# Patient Record
Sex: Female | Born: 1935 | Race: White | Hispanic: No | Smoking: Never smoker
Health system: Southern US, Community
[De-identification: ages and names within clinical notes are randomized; demographics above are authoritative.]

## PROBLEM LIST (undated history)

## (undated) DIAGNOSIS — N390 Urinary tract infection, site not specified: Secondary | ICD-10-CM

## (undated) DIAGNOSIS — K219 Gastro-esophageal reflux disease without esophagitis: Secondary | ICD-10-CM

## (undated) DIAGNOSIS — D649 Anemia, unspecified: Secondary | ICD-10-CM

## (undated) DIAGNOSIS — I639 Cerebral infarction, unspecified: Secondary | ICD-10-CM

## (undated) DIAGNOSIS — E119 Type 2 diabetes mellitus without complications: Secondary | ICD-10-CM

## (undated) DIAGNOSIS — M810 Age-related osteoporosis without current pathological fracture: Secondary | ICD-10-CM

## (undated) DIAGNOSIS — F03918 Unspecified dementia, unspecified severity, with other behavioral disturbance: Secondary | ICD-10-CM

## (undated) DIAGNOSIS — R42 Dizziness and giddiness: Secondary | ICD-10-CM

## (undated) DIAGNOSIS — F0390 Unspecified dementia without behavioral disturbance: Secondary | ICD-10-CM

## (undated) DIAGNOSIS — K2210 Ulcer of esophagus without bleeding: Secondary | ICD-10-CM

## (undated) DIAGNOSIS — E079 Disorder of thyroid, unspecified: Secondary | ICD-10-CM

## (undated) DIAGNOSIS — F0391 Unspecified dementia with behavioral disturbance: Secondary | ICD-10-CM

## (undated) DIAGNOSIS — E785 Hyperlipidemia, unspecified: Secondary | ICD-10-CM

## (undated) DIAGNOSIS — I10 Essential (primary) hypertension: Secondary | ICD-10-CM

## (undated) DIAGNOSIS — F419 Anxiety disorder, unspecified: Secondary | ICD-10-CM

## (undated) DIAGNOSIS — I480 Paroxysmal atrial fibrillation: Secondary | ICD-10-CM

## (undated) HISTORY — DX: Paroxysmal atrial fibrillation: I48.0

## (undated) HISTORY — PX: APPENDECTOMY: SHX54

## (undated) HISTORY — DX: Ulcer of esophagus without bleeding: K22.10

## (undated) HISTORY — DX: Age-related osteoporosis without current pathological fracture: M81.0

## (undated) HISTORY — DX: Anxiety disorder, unspecified: F41.9

## (undated) HISTORY — DX: Unspecified dementia, unspecified severity, with other behavioral disturbance: F03.918

## (undated) HISTORY — PX: CHOLECYSTECTOMY: SHX55

## (undated) HISTORY — PX: ABDOMINAL HYSTERECTOMY: SHX81

## (undated) HISTORY — DX: Hyperlipidemia, unspecified: E78.5

## (undated) HISTORY — DX: Unspecified dementia with behavioral disturbance: F03.91

## (undated) HISTORY — DX: Dizziness and giddiness: R42

## (undated) HISTORY — DX: Gastro-esophageal reflux disease without esophagitis: K21.9

## (undated) HISTORY — DX: Unspecified dementia, unspecified severity, without behavioral disturbance, psychotic disturbance, mood disturbance, and anxiety: F03.90

## (undated) HISTORY — DX: Disorder of thyroid, unspecified: E07.9

---

## 2002-12-05 ENCOUNTER — Encounter: Payer: Self-pay | Admitting: Gastroenterology

## 2002-12-05 ENCOUNTER — Ambulatory Visit (HOSPITAL_COMMUNITY): Admission: RE | Admit: 2002-12-05 | Discharge: 2002-12-05 | Payer: Self-pay | Admitting: Gastroenterology

## 2003-05-22 ENCOUNTER — Ambulatory Visit (HOSPITAL_COMMUNITY): Admission: RE | Admit: 2003-05-22 | Discharge: 2003-05-22 | Payer: Self-pay | Admitting: Gastroenterology

## 2012-12-21 ENCOUNTER — Emergency Department (HOSPITAL_COMMUNITY)
Admission: EM | Admit: 2012-12-21 | Discharge: 2012-12-21 | Disposition: A | Discharge status: Home / Self Care | Payer: Medicare Other

## 2013-02-01 ENCOUNTER — Ambulatory Visit (HOSPITAL_COMMUNITY): Payer: Self-pay | Admitting: Psychiatry

## 2013-03-27 ENCOUNTER — Ambulatory Visit (HOSPITAL_COMMUNITY): Payer: Self-pay | Admitting: Psychiatry

## 2013-04-02 ENCOUNTER — Ambulatory Visit (INDEPENDENT_AMBULATORY_CARE_PROVIDER_SITE_OTHER): Payer: No Typology Code available for payment source | Admitting: Psychiatry

## 2013-04-02 ENCOUNTER — Encounter (HOSPITAL_COMMUNITY): Payer: Self-pay | Admitting: Psychiatry

## 2013-04-02 VITALS — BP 121/64 | HR 70 | Ht 59.0 in | Wt 162.0 lb

## 2013-04-02 DIAGNOSIS — F411 Generalized anxiety disorder: Secondary | ICD-10-CM
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
## 2013-04-03 LAB — DRUGS OF ABUSE SCREEN W/O ALC, ROUTINE URINE
Amphetamine Screen, Ur: NEGATIVE
Benzodiazepines.: POSITIVE — AB
Marijuana Metabolite: NEGATIVE
Methadone: NEGATIVE
Opiate Screen, Urine: NEGATIVE
Phencyclidine (PCP): NEGATIVE

## 2013-04-03 MED ORDER — ALPRAZOLAM 1 MG PO TABS: 1.0000 mg | ORAL_TABLET | Freq: Every evening | ORAL | Status: DC | PRN

## 2013-04-04 LAB — BENZODIAZEPINES (GC/LC/MS), URINE
Diazepam (GC/LC/MS), ur confirm: NEGATIVE ng/mL
Estazolam (GC/LC/MS), ur confirm: NEGATIVE ng/mL
Midazolam (GC/LC/MS), ur confirm: NEGATIVE ng/mL
Nordiazepam (GC/LC/MS), ur confirm: NEGATIVE ng/mL
Oxazepam (GC/LC/MS), ur confirm: NEGATIVE ng/mL
Triazolam metabolite (GC/LC/MS), ur confirm: NEGATIVE ng/mL

## 2013-04-05 DIAGNOSIS — F411 Generalized anxiety disorder: Secondary | ICD-10-CM | POA: Insufficient documentation

## 2013-05-14 ENCOUNTER — Encounter (HOSPITAL_COMMUNITY): Payer: Self-pay | Admitting: Psychiatry

## 2013-05-14 ENCOUNTER — Ambulatory Visit (INDEPENDENT_AMBULATORY_CARE_PROVIDER_SITE_OTHER): Payer: No Typology Code available for payment source | Admitting: Psychiatry

## 2013-05-14 VITALS — BP 113/65 | HR 69 | Ht 59.0 in | Wt 170.0 lb

## 2013-05-14 DIAGNOSIS — F411 Generalized anxiety disorder: Secondary | ICD-10-CM

## 2013-05-14 MED ORDER — CLONAZEPAM 0.5 MG PO TABS: 0.5000 mg | ORAL_TABLET | Freq: Two times a day (BID) | ORAL | Status: DC | PRN

## 2013-05-14 MED ORDER — CLONAZEPAM 0.5 MG PO TBDP: 0.5000 mg | ORAL_TABLET | Freq: Two times a day (BID) | ORAL | Status: DC | PRN
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
## 2013-05-22 ENCOUNTER — Ambulatory Visit (INDEPENDENT_AMBULATORY_CARE_PROVIDER_SITE_OTHER): Payer: No Typology Code available for payment source | Admitting: Psychiatry

## 2013-05-22 ENCOUNTER — Ambulatory Visit (HOSPITAL_COMMUNITY): Payer: Self-pay | Admitting: Psychiatry

## 2013-05-22 ENCOUNTER — Encounter (HOSPITAL_COMMUNITY): Payer: Self-pay | Admitting: Psychiatry

## 2013-05-22 VITALS — BP 110/67 | HR 61 | Ht 59.0 in | Wt 170.0 lb

## 2013-05-22 DIAGNOSIS — F411 Generalized anxiety disorder: Secondary | ICD-10-CM

## 2013-05-22 MED ORDER — CLONAZEPAM 0.5 MG PO TABS: 0.5000 mg | ORAL_TABLET | Freq: Two times a day (BID) | ORAL | Status: DC | PRN

## 2013-05-22 MED ORDER — SERTRALINE HCL 25 MG PO TABS: 25.0000 mg | ORAL_TABLET | Freq: Every day | ORAL | Status: DC
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
## 2013-05-25 ENCOUNTER — Telehealth (HOSPITAL_COMMUNITY): Payer: Self-pay

## 2013-05-25 NOTE — Addendum Note (Signed)
Addended by: Larena Sox on: 05/25/2013 04:58 PM   Modules accepted: Level of Service

## 2013-05-25 NOTE — Telephone Encounter (Signed)
Called patient. She reports she has not started Zoloft.  The patient's husband reports that his daughter would send his wife Xanax for anxiety particularly when she was out of medications.  I advised him and the patient not to send or receive any medications from anyone else. I explained to them that I would not speak to the patient's daughter without a signed release by the patient and that I could speak to the patient's daughter in her presence at the patient's next appointment.

## 2013-05-25 NOTE — Telephone Encounter (Signed)
Pt daughter crystal called to speak to you about patient. She states that patient is not to take zoloft that she has had a deadly reaction to it. She states that you have disrespected her by not listening and that she is 79 years old and not someone to experiment on. i explained to daughter that i would pass the message along but that we could not discuss anything with her because there was not a release to speak to you. She stated also that the person Diane that we have a release to speak to is not family and is the patients enemy. She stated that she should not be in the appointments and that the patient requested she not be in them.  She proceeded to get belligerent and state that she was going to sue the hospital. i told her that she could do so if she chooses but that she would not continue to yell and curse at me that i would send the message and ended the call.  Crystal number is [phone]

## 2013-05-28 ENCOUNTER — Encounter (HOSPITAL_COMMUNITY): Payer: Self-pay | Admitting: Psychiatry

## 2013-05-29 ENCOUNTER — Ambulatory Visit (HOSPITAL_COMMUNITY): Payer: Self-pay | Admitting: Psychiatry

## 2013-11-07 ENCOUNTER — Encounter: Payer: Self-pay | Admitting: Internal Medicine

## 2013-11-07 ENCOUNTER — Non-Acute Institutional Stay (SKILLED_NURSING_FACILITY): Payer: Medicare HMO | Admitting: Internal Medicine

## 2013-11-07 DIAGNOSIS — IMO0002 Reserved for concepts with insufficient information to code with codable children: Secondary | ICD-10-CM

## 2013-11-07 DIAGNOSIS — E039 Hypothyroidism, unspecified: Secondary | ICD-10-CM | POA: Insufficient documentation

## 2013-11-07 DIAGNOSIS — S2220XA Unspecified fracture of sternum, initial encounter for closed fracture: Secondary | ICD-10-CM | POA: Insufficient documentation

## 2013-11-07 DIAGNOSIS — F0391 Unspecified dementia with behavioral disturbance: Secondary | ICD-10-CM | POA: Insufficient documentation

## 2013-11-07 DIAGNOSIS — I480 Paroxysmal atrial fibrillation: Secondary | ICD-10-CM | POA: Insufficient documentation

## 2013-11-07 DIAGNOSIS — F03918 Unspecified dementia, unspecified severity, with other behavioral disturbance: Secondary | ICD-10-CM | POA: Insufficient documentation

## 2013-11-07 DIAGNOSIS — N390 Urinary tract infection, site not specified: Secondary | ICD-10-CM | POA: Insufficient documentation

## 2013-11-07 DIAGNOSIS — E785 Hyperlipidemia, unspecified: Secondary | ICD-10-CM | POA: Insufficient documentation

## 2013-11-07 DIAGNOSIS — F419 Anxiety disorder, unspecified: Secondary | ICD-10-CM | POA: Insufficient documentation

## 2013-11-07 DIAGNOSIS — F411 Generalized anxiety disorder: Secondary | ICD-10-CM

## 2013-11-07 DIAGNOSIS — M4850XA Collapsed vertebra, not elsewhere classified, site unspecified, initial encounter for fracture: Secondary | ICD-10-CM | POA: Insufficient documentation

## 2013-11-07 DIAGNOSIS — I4891 Unspecified atrial fibrillation: Secondary | ICD-10-CM

## 2013-11-07 DIAGNOSIS — K219 Gastro-esophageal reflux disease without esophagitis: Secondary | ICD-10-CM | POA: Insufficient documentation
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
## 2013-11-07 NOTE — Assessment & Plan Note (Signed)
Sustained during CPR for choking;pt denies CP but has meds if need

## 2013-11-07 NOTE — Assessment & Plan Note (Signed)
TPO antibodies in process  Continue Levothyroxine 75 mcg

## 2013-11-07 NOTE — Assessment & Plan Note (Signed)
-  Continue Lipitor °

## 2013-11-07 NOTE — Assessment & Plan Note (Signed)
Treated in the hospital with hx of recurrent UTI and is on trimpex as prophylaxis
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
## 2013-11-07 NOTE — Assessment & Plan Note (Addendum)
controlled with cardizem;on statin and ASA

## 2013-11-07 NOTE — Assessment & Plan Note (Signed)
Continue aricept, celexa, and abilify

## 2013-11-07 NOTE — Assessment & Plan Note (Signed)
Continue omeprazole 

## 2013-11-07 NOTE — Assessment & Plan Note (Signed)
Treated with celexa and xanax; will continue

## 2013-11-12 ENCOUNTER — Non-Acute Institutional Stay (SKILLED_NURSING_FACILITY): Payer: Medicare HMO | Admitting: Internal Medicine

## 2013-11-12 DIAGNOSIS — IMO0002 Reserved for concepts with insufficient information to code with codable children: Secondary | ICD-10-CM

## 2013-11-12 DIAGNOSIS — M4850XA Collapsed vertebra, not elsewhere classified, site unspecified, initial encounter for fracture: Secondary | ICD-10-CM

## 2013-11-12 DIAGNOSIS — D649 Anemia, unspecified: Secondary | ICD-10-CM

## 2013-11-12 DIAGNOSIS — L600 Ingrowing nail: Secondary | ICD-10-CM | POA: Insufficient documentation

## 2013-11-12 DIAGNOSIS — E039 Hypothyroidism, unspecified: Secondary | ICD-10-CM
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
## 2013-11-16 ENCOUNTER — Non-Acute Institutional Stay (SKILLED_NURSING_FACILITY): Payer: Medicare HMO | Admitting: Internal Medicine

## 2013-11-16 DIAGNOSIS — F32A Depression, unspecified: Secondary | ICD-10-CM

## 2013-11-16 DIAGNOSIS — F329 Major depressive disorder, single episode, unspecified: Secondary | ICD-10-CM

## 2013-11-16 DIAGNOSIS — I480 Paroxysmal atrial fibrillation: Secondary | ICD-10-CM

## 2013-11-16 DIAGNOSIS — F3289 Other specified depressive episodes: Secondary | ICD-10-CM

## 2013-11-16 DIAGNOSIS — R05 Cough: Secondary | ICD-10-CM

## 2013-11-16 DIAGNOSIS — E039 Hypothyroidism, unspecified: Secondary | ICD-10-CM

## 2013-11-16 DIAGNOSIS — R059 Cough, unspecified: Secondary | ICD-10-CM

## 2013-11-16 DIAGNOSIS — I4891 Unspecified atrial fibrillation: Secondary | ICD-10-CM

## 2013-11-16 DIAGNOSIS — M4850XA Collapsed vertebra, not elsewhere classified, site unspecified, initial encounter for fracture: Secondary | ICD-10-CM

## 2013-11-16 DIAGNOSIS — IMO0002 Reserved for concepts with insufficient information to code with codable children: Secondary | ICD-10-CM

## 2013-11-16 NOTE — Progress Notes (Signed)
Patient ID: Gina Hodges, female   DOB: Jun 30, 1927, 80 y.o.   MRN: [id number]   This is an acute visit.  Level of care skilled.  Facility [facility].   Chief complaint-acute visit--- again very to chest congestion-depression?.  History of present illness.  Patient is a pleasant elderly resident here for rehabilitation after sustaining a compression fracture of T8 and T12 apparently sustained while receiving CPR after choking on a piece of meat.  She received a vertebroplasty and is here for rehabilitation  She appears to be doing relatively well in this regard  However nursing staff has left a note about patient family concern about possible rattling in her chest with some chest congestion also her daughter apparently feels that patient is depressed  Her vital signs continued to be stable. She has been on chronic oxygen apparently since her hospitalization-she does not report any increase shortness of breath tonight although apparently she does have a cough which is fairly nonproductive  In regards to depression she did state that she is somewhat sad just with her weakened condition and being in here-----she is on Celexa 20 mg a day     Family medical social history has been reviewed per progress note on 11/07/2013 .  Medications have been reviewed per MAR.   Review of systems Limited.patient does not speak a lot  General no complaints of fever or chills.  Respiratory does not complaining of shortness of breath --but does have a somewhat nonproductive cough-she is on chronic oxygen.  Cardiac no chest pain does not appear to have significant lower extremity edema.  Muscle skeletal-history of spinal compression fractures as noted above pain appears to be controlled.  Of note staff has noted some ingrown nails great toes bilaterally --- apparently they are improving somewhat.  Neurologic she is not complaining of any headache or dizziness nursing staff has not noted this either.    Endocrine-he does have a history of the thyroid issues she is on Synthroid.   Physical exam.   Temperature 98.5 pulse 74 respirations 18 blood pressure 136/66 last noted weight 157.8 .  In general this is a pleasant elderly female in no distress sitting comfortably in her wheelchair.  Her skin is warm and dry.  Chest --no labored breathing but she does have some expiratory rhonchi.  Heart is regular rate and rhythm without murmur gallop or rub.  Abdomen is obese soft nontender with slightly hypoactive bowel sounds.  Muscle skeletal--she is currently in bed which limits exam but appears to move her extremities at baseline she has trace lower extremity edema-both great toes appear to have some mildly ingrown nails-wound care is monitoring this and she has a podiatry consult pending this does not appear to be infected .  Neurologic appears grossly intact cranial nerves intact Psych-she appears grossly alert and appropriate but a woman a few words-she does state she is somewhat sad because of her condition--   Labs  11/15/2013.  WBC 7.3 hemoglobin 10.3 platelets 326.  Sodium 139 potassium 4.5 BUN 15 creatinine 0.66.   Marland Kitchen  11/03/2013.  VBC 7.3 hemoglobin 10.4 platelets 375.  Sodium 143 potassium 4 BUN 18 creatinine 0.60.  1-23- 2015.  Liver function tests within normal limits of note albumin 3.6  WBC 9.6 hemoglobin 12.0 platelets 245.   Assessment and plan.   #1-chest congestion-we'll order a chest x-ray also Mucinex 600 mg twice a day for 5 days-also vital signs pulse ox every shift for 72 hours- does not appear to be  in any distress but her chest does appear to be somewhat more congested than previous exam.--Monitor with vital signs pulse ox every shift x72 hours  #2-depression- is on Celexa she does have somewhat of a flat affect  With some sadness appreciated-- as noted above-will write order to have psychiatric service followup on this--according to nursing staff she eats pretty  well will order weekly weights to keep an eye on  this  #3-patient does have some generalized weakness however blood work done yesterday appears unremarkable--also with history of UTIs we'll check a UA C&S--apparently TSH is pending     #4-history of thoracic compression fractures-at this point appears to be stable she is not complaining of pain in regards to this today, though appears to be effective.  #5 history of hypothyroidism is on Synthroid I do not see a recent TSH per chart review  the this is pending and was drawn on lab yesterday 6 anemia-hemoglobin appeared to drop a bit before discharge per chart review--it appears this has stabilized . 7-atrial fibrillation this appears to be rate controlled--she is on Cardizem as well as aspirin    [id number].  -   .  f

## 2013-12-18 ENCOUNTER — Other Ambulatory Visit: Payer: Self-pay | Admitting: *Deleted

## 2013-12-18 MED ORDER — TRAMADOL HCL 50 MG PO TABS: ORAL_TABLET | ORAL | Status: DC

## 2013-12-18 NOTE — Telephone Encounter (Signed)
rx printed by mistake due to not doing [facility] anymore.

## 2016-02-19 ENCOUNTER — Inpatient Hospital Stay (HOSPITAL_COMMUNITY)
Admission: EM | Admit: 2016-02-19 | Discharge: 2016-02-24 | DRG: 194 | Disposition: A | Discharge status: Skilled Nursing Facility | Payer: Medicare HMO | Attending: Family Medicine | Admitting: Family Medicine

## 2016-02-19 ENCOUNTER — Emergency Department (HOSPITAL_COMMUNITY): Payer: Medicare HMO

## 2016-02-19 ENCOUNTER — Encounter (HOSPITAL_COMMUNITY): Payer: Self-pay | Admitting: Emergency Medicine

## 2016-02-19 DIAGNOSIS — K219 Gastro-esophageal reflux disease without esophagitis: Secondary | ICD-10-CM | POA: Diagnosis present

## 2016-02-19 DIAGNOSIS — E872 Acidosis, unspecified: Secondary | ICD-10-CM

## 2016-02-19 DIAGNOSIS — F411 Generalized anxiety disorder: Secondary | ICD-10-CM | POA: Diagnosis present

## 2016-02-19 DIAGNOSIS — Z79899 Other long term (current) drug therapy: Secondary | ICD-10-CM

## 2016-02-19 DIAGNOSIS — D72819 Decreased white blood cell count, unspecified: Secondary | ICD-10-CM | POA: Diagnosis present

## 2016-02-19 DIAGNOSIS — K5900 Constipation, unspecified: Secondary | ICD-10-CM | POA: Diagnosis present

## 2016-02-19 DIAGNOSIS — K449 Diaphragmatic hernia without obstruction or gangrene: Secondary | ICD-10-CM | POA: Diagnosis present

## 2016-02-19 DIAGNOSIS — I480 Paroxysmal atrial fibrillation: Secondary | ICD-10-CM | POA: Diagnosis present

## 2016-02-19 DIAGNOSIS — M1990 Unspecified osteoarthritis, unspecified site: Secondary | ICD-10-CM | POA: Diagnosis present

## 2016-02-19 DIAGNOSIS — Y95 Nosocomial condition: Secondary | ICD-10-CM | POA: Diagnosis present

## 2016-02-19 DIAGNOSIS — M810 Age-related osteoporosis without current pathological fracture: Secondary | ICD-10-CM | POA: Diagnosis present

## 2016-02-19 DIAGNOSIS — F0391 Unspecified dementia with behavioral disturbance: Secondary | ICD-10-CM | POA: Diagnosis present

## 2016-02-19 DIAGNOSIS — J189 Pneumonia, unspecified organism: Principal | ICD-10-CM | POA: Diagnosis present

## 2016-02-19 DIAGNOSIS — H9190 Unspecified hearing loss, unspecified ear: Secondary | ICD-10-CM | POA: Diagnosis present

## 2016-02-19 DIAGNOSIS — E039 Hypothyroidism, unspecified: Secondary | ICD-10-CM | POA: Diagnosis present

## 2016-02-19 DIAGNOSIS — Z66 Do not resuscitate: Secondary | ICD-10-CM | POA: Diagnosis present

## 2016-02-19 DIAGNOSIS — Z818 Family history of other mental and behavioral disorders: Secondary | ICD-10-CM

## 2016-02-19 DIAGNOSIS — R6510 Systemic inflammatory response syndrome (SIRS) of non-infectious origin without acute organ dysfunction: Secondary | ICD-10-CM | POA: Diagnosis not present

## 2016-02-19 DIAGNOSIS — E785 Hyperlipidemia, unspecified: Secondary | ICD-10-CM | POA: Diagnosis present

## 2016-02-19 DIAGNOSIS — R0602 Shortness of breath: Secondary | ICD-10-CM | POA: Diagnosis present

## 2016-02-19 DIAGNOSIS — E871 Hypo-osmolality and hyponatremia: Secondary | ICD-10-CM | POA: Diagnosis present

## 2016-02-19 DIAGNOSIS — F329 Major depressive disorder, single episode, unspecified: Secondary | ICD-10-CM | POA: Diagnosis present

## 2016-02-19 DIAGNOSIS — F03918 Unspecified dementia, unspecified severity, with other behavioral disturbance: Secondary | ICD-10-CM | POA: Diagnosis present

## 2016-02-19 LAB — CBC
HEMATOCRIT: 38.2 % (ref 36.0–46.0)
HEMOGLOBIN: 12.1 g/dL (ref 12.0–15.0)
MCH: 29 pg (ref 26.0–34.0)
MCHC: 31.7 g/dL (ref 30.0–36.0)
MCV: 91.6 fL (ref 78.0–100.0)
Platelets: 223 10*3/uL (ref 150–400)
RBC: 4.17 MIL/uL (ref 3.87–5.11)
RDW: 14.8 % (ref 11.5–15.5)
WBC: 3 10*3/uL — AB (ref 4.0–10.5)

## 2016-02-19 LAB — COMPREHENSIVE METABOLIC PANEL
ALK PHOS: 68 U/L (ref 38–126)
ALT: 15 U/L (ref 14–54)
AST: 26 U/L (ref 15–41)
Albumin: 3.7 g/dL (ref 3.5–5.0)
Anion gap: 12 (ref 5–15)
BUN: 8 mg/dL (ref 6–20)
CALCIUM: 9 mg/dL (ref 8.9–10.3)
CO2: 21 mmol/L — ABNORMAL LOW (ref 22–32)
CREATININE: 0.83 mg/dL (ref 0.44–1.00)
Chloride: 100 mmol/L — ABNORMAL LOW (ref 101–111)
Glucose, Bld: 136 mg/dL — ABNORMAL HIGH (ref 65–99)
Potassium: 3.7 mmol/L (ref 3.5–5.1)
Sodium: 133 mmol/L — ABNORMAL LOW (ref 135–145)
Total Bilirubin: 0.5 mg/dL (ref 0.3–1.2)
Total Protein: 6.3 g/dL — ABNORMAL LOW (ref 6.5–8.1)

## 2016-02-19 LAB — BRAIN NATRIURETIC PEPTIDE: B NATRIURETIC PEPTIDE 5: 40.5 pg/mL (ref 0.0–100.0)

## 2016-02-19 LAB — TSH: TSH: 0.525 u[IU]/mL (ref 0.350–4.500)

## 2016-02-19 LAB — LACTIC ACID, PLASMA
LACTIC ACID, VENOUS: 2.4 mmol/L — AB (ref 0.5–2.0)
LACTIC ACID, VENOUS: 5.4 mmol/L — AB (ref 0.5–2.0)
Lactic Acid, Venous: 2.3 mmol/L (ref 0.5–2.0)

## 2016-02-19 LAB — I-STAT TROPONIN, ED: TROPONIN I, POC: 0 ng/mL (ref 0.00–0.08)

## 2016-02-19 LAB — MRSA PCR SCREENING: MRSA BY PCR: NEGATIVE

## 2016-02-19 MED ORDER — VANCOMYCIN HCL IN DEXTROSE 750-5 MG/150ML-% IV SOLN
750.0000 mg | Freq: Two times a day (BID) | INTRAVENOUS | Status: DC
  Administered 2016-02-20 – 2016-02-22 (×5): 750 mg via INTRAVENOUS
  Filled 2016-02-19 (×6): qty 150

## 2016-02-19 MED ORDER — SODIUM CHLORIDE 0.9 % IV BOLUS (SEPSIS)
500.0000 mL | Freq: Once | INTRAVENOUS | Status: AC
  Administered 2016-02-19: 500 mL via INTRAVENOUS

## 2016-02-19 MED ORDER — DOCUSATE SODIUM 100 MG PO CAPS
100.0000 mg | ORAL_CAPSULE | Freq: Every day | ORAL | Status: DC | PRN
Start: 2016-02-19 — End: 2016-02-24

## 2016-02-19 MED ORDER — SODIUM CHLORIDE 0.9 % IV SOLN
INTRAVENOUS | Status: DC
  Administered 2016-02-19 – 2016-02-20 (×2): via INTRAVENOUS

## 2016-02-19 MED ORDER — DEXTROSE 5 % IV SOLN
1.0000 g | Freq: Three times a day (TID) | INTRAVENOUS | Status: DC
  Administered 2016-02-19 – 2016-02-20 (×4): 1 g via INTRAVENOUS
  Filled 2016-02-19 (×6): qty 1

## 2016-02-19 MED ORDER — CITALOPRAM HYDROBROMIDE 20 MG PO TABS
20.0000 mg | ORAL_TABLET | Freq: Every day | ORAL | Status: DC
Start: 2016-02-20 — End: 2016-02-24
  Administered 2016-02-20 – 2016-02-24 (×5): 20 mg via ORAL
  Filled 2016-02-19 (×5): qty 1

## 2016-02-19 MED ORDER — BOOST / RESOURCE BREEZE PO LIQD
1.0000 | Freq: Three times a day (TID) | ORAL | Status: DC
  Administered 2016-02-19 – 2016-02-24 (×10): 1 via ORAL
  Filled 2016-02-19: qty 1

## 2016-02-19 MED ORDER — SODIUM CHLORIDE 0.9 % IV BOLUS (SEPSIS)
1000.0000 mL | Freq: Once | INTRAVENOUS | Status: AC
  Administered 2016-02-19: 1000 mL via INTRAVENOUS

## 2016-02-19 MED ORDER — DEXTROSE 5 % IV SOLN
1.0000 g | Freq: Once | INTRAVENOUS | Status: AC
  Administered 2016-02-19: 1 g via INTRAVENOUS
  Filled 2016-02-19: qty 10

## 2016-02-19 MED ORDER — LORAZEPAM 1 MG PO TABS
1.0000 mg | ORAL_TABLET | Freq: Every day | ORAL | Status: DC
  Administered 2016-02-19 – 2016-02-22 (×4): 1 mg via ORAL
  Filled 2016-02-19 (×4): qty 1

## 2016-02-19 MED ORDER — GUAIFENESIN 100 MG/5ML PO SOLN
200.0000 mg | Freq: Four times a day (QID) | ORAL | Status: DC | PRN
  Administered 2016-02-20 – 2016-02-23 (×3): 200 mg via ORAL
  Filled 2016-02-19 (×5): qty 10

## 2016-02-19 MED ORDER — VANCOMYCIN HCL 10 G IV SOLR
1500.0000 mg | Freq: Once | INTRAVENOUS | Status: AC
  Administered 2016-02-19: 1500 mg via INTRAVENOUS
  Filled 2016-02-19: qty 1500

## 2016-02-19 MED ORDER — ADULT MULTIVITAMIN W/MINERALS CH
1.0000 | ORAL_TABLET | Freq: Every day | ORAL | Status: DC
  Administered 2016-02-20 – 2016-02-24 (×5): 1 via ORAL
  Filled 2016-02-19 (×5): qty 1

## 2016-02-19 MED ORDER — DONEPEZIL HCL 10 MG PO TABS
10.0000 mg | ORAL_TABLET | Freq: Every day | ORAL | Status: DC
  Administered 2016-02-19 – 2016-02-23 (×5): 10 mg via ORAL
  Filled 2016-02-19 (×5): qty 1

## 2016-02-19 MED ORDER — NAPROXEN 250 MG PO TABS
500.0000 mg | ORAL_TABLET | Freq: Three times a day (TID) | ORAL | Status: DC
  Administered 2016-02-19 – 2016-02-24 (×13): 500 mg via ORAL
  Filled 2016-02-19 (×15): qty 2

## 2016-02-19 MED ORDER — ALBUTEROL SULFATE (2.5 MG/3ML) 0.083% IN NEBU
2.5000 mg | INHALATION_SOLUTION | RESPIRATORY_TRACT | Status: DC | PRN
  Administered 2016-02-19 – 2016-02-24 (×4): 2.5 mg via RESPIRATORY_TRACT
  Filled 2016-02-19 (×4): qty 3

## 2016-02-19 MED ORDER — ACETAMINOPHEN 325 MG PO TABS
650.0000 mg | ORAL_TABLET | Freq: Two times a day (BID) | ORAL | Status: DC
  Administered 2016-02-19 – 2016-02-23 (×8): 650 mg via ORAL
  Filled 2016-02-19 (×10): qty 2

## 2016-02-19 MED ORDER — IPRATROPIUM-ALBUTEROL 0.5-2.5 (3) MG/3ML IN SOLN
3.0000 mL | Freq: Four times a day (QID) | RESPIRATORY_TRACT | Status: DC
  Administered 2016-02-19 – 2016-02-20 (×5): 3 mL via RESPIRATORY_TRACT
  Filled 2016-02-19 (×5): qty 3

## 2016-02-19 MED ORDER — ASPIRIN EC 81 MG PO TBEC
81.0000 mg | DELAYED_RELEASE_TABLET | Freq: Every day | ORAL | Status: DC
  Administered 2016-02-20 – 2016-02-24 (×5): 81 mg via ORAL
  Filled 2016-02-19 (×5): qty 1

## 2016-02-19 MED ORDER — PANTOPRAZOLE SODIUM 20 MG PO TBEC
20.0000 mg | DELAYED_RELEASE_TABLET | Freq: Every day | ORAL | Status: DC
  Administered 2016-02-20 – 2016-02-24 (×5): 20 mg via ORAL
  Filled 2016-02-19 (×5): qty 1

## 2016-02-19 MED ORDER — ENOXAPARIN SODIUM 40 MG/0.4ML ~~LOC~~ SOLN
40.0000 mg | SUBCUTANEOUS | Status: DC
  Administered 2016-02-19 – 2016-02-23 (×5): 40 mg via SUBCUTANEOUS
  Filled 2016-02-19 (×5): qty 0.4

## 2016-02-19 MED ORDER — ATORVASTATIN CALCIUM 10 MG PO TABS
10.0000 mg | ORAL_TABLET | Freq: Every day | ORAL | Status: DC
  Administered 2016-02-19 – 2016-02-23 (×5): 10 mg via ORAL
  Filled 2016-02-19 (×5): qty 1

## 2016-02-19 MED ORDER — SODIUM CHLORIDE 0.9 % IV SOLN: INTRAVENOUS | Status: DC

## 2016-02-19 MED ORDER — LISINOPRIL 2.5 MG PO TABS
1.2500 mg | ORAL_TABLET | Freq: Every day | ORAL | Status: DC
  Administered 2016-02-20: 1.25 mg via ORAL
  Filled 2016-02-19: qty 0.5

## 2016-02-19 MED ORDER — POLYETHYLENE GLYCOL 3350 17 G PO PACK
17.0000 g | PACK | Freq: Every day | ORAL | Status: DC
  Administered 2016-02-19 – 2016-02-24 (×3): 17 g via ORAL
  Filled 2016-02-19 (×4): qty 1

## 2016-02-19 MED ORDER — LEVOTHYROXINE SODIUM 75 MCG PO TABS
75.0000 ug | ORAL_TABLET | Freq: Every day | ORAL | Status: DC
  Administered 2016-02-20 – 2016-02-24 (×5): 75 ug via ORAL
  Filled 2016-02-19 (×5): qty 1

## 2016-02-19 MED ORDER — DEXTROSE 5 % IV SOLN
500.0000 mg | Freq: Once | INTRAVENOUS | Status: AC
  Administered 2016-02-19: 500 mg via INTRAVENOUS
  Filled 2016-02-19: qty 500

## 2016-02-19 NOTE — ED Provider Notes (Signed)
CSN: [id number]     Arrival date & time 02/19/16  1116 History   First MD Initiated Contact with Patient 02/19/16 1127     Chief Complaint  Patient presents with  . Shortness of Breath     (Consider location/radiation/quality/duration/timing/severity/associated sxs/prior Treatment) Patient is a 82 y.o. female presenting with shortness of breath. The history is provided by the patient, medical records and the EMS personnel. History limited by: Baseline dementia. No language interpreter was used.  Shortness of Breath Severity:  Moderate Onset quality:  Gradual Duration:  3 days Timing:  Constant Progression:  Worsening Chronicity:  New Relieved by: mild relief after duonebs given by EMS. Worsened by:  Deep breathing and coughing Ineffective treatments:  Inhaler Associated symptoms: cough and wheezing   Associated symptoms: no abdominal pain, no chest pain, no diaphoresis, no fever, no headaches, no hemoptysis, no sputum production and no vomiting   Associated symptoms comment:  Chills Risk factors: no tobacco use    History slightly limited secondary to patient dementia.  Past Medical History  Diagnosis Date  . Thyroid disease   . Vertigo   . Dementia   . Osteoporosis   . Hyperlipidemia   . Dementia with behavioral problem   . GERD (gastroesophageal reflux disease)   . PAF (paroxysmal atrial fibrillation) (HCC)   . Anxiety   . Ulcerative esophagitis    Past Surgical History  Procedure Laterality Date  . Appendectomy    . Cholecystectomy    . Abdominal hysterectomy     Family History  Problem Relation Age of Onset  . Depression Mother   . Bipolar disorder Daughter   . Drug abuse Son    Social History  Substance Use Topics  . Smoking status: Never Smoker   . Smokeless tobacco: None  . Alcohol Use: No   OB History    No data available     Review of Systems  Constitutional: Positive for chills. Negative for fever and diaphoresis.  HENT: Negative for  congestion and rhinorrhea.   Eyes: Negative for visual disturbance.  Respiratory: Positive for cough, shortness of breath and wheezing. Negative for hemoptysis and sputum production.   Cardiovascular: Negative for chest pain.  Gastrointestinal: Positive for constipation (chronic). Negative for nausea, vomiting, abdominal pain and diarrhea.  Genitourinary: Negative for dysuria and difficulty urinating.  Neurological: Negative for dizziness and headaches.       Dementia at baseline      Allergies  Penicillins; Sulfa antibiotics; and Codeine  Home Medications   Prior to Admission medications   Medication Sig Start Date End Date Taking? Authorizing Provider  alendronate (FOSAMAX) 70 MG tablet Take 70 mg by mouth every 7 (seven) days. Take with a full glass of water on an empty stomach.    Historical Provider, MD  ARIPiprazole (ABILIFY) 5 MG tablet Take 5 mg by mouth daily.    Historical Provider, MD  aspirin EC 81 MG tablet Take 81 mg by mouth daily.    Historical Provider, MD  atorvastatin (LIPITOR) 10 MG tablet Take 1 tablet by mouth at bedtime. 02/27/13   Historical Provider, MD  diltiazem (CARDIZEM CD) 120 MG 24 hr capsule Take 120 mg by mouth daily.    Historical Provider, MD  donepezil (ARICEPT) 10 MG tablet Take 10 mg by mouth daily. 03/22/13   Historical Provider, MD  levothyroxine (SYNTHROID, LEVOTHROID) 75 MCG tablet Take 1 tablet by mouth daily. 02/27/13   Historical Provider, MD  LOPERAMIDE HCL PO Take 2 mg by  mouth as needed.    Historical Provider, MD  loratadine (CLARITIN) 10 MG tablet Take 10 mg by mouth daily.    Historical Provider, MD  Multiple Vitamin (MULTIVITAMIN) tablet Take 1 tablet by mouth daily.    Historical Provider, MD  omeprazole (PRILOSEC) 20 MG capsule Take 20 mg by mouth daily.    Historical Provider, MD  sennosides-docusate sodium (SENOKOT-S) 8.6-50 MG tablet Take 2 tablets by mouth 2 (two) times daily.    Historical Provider, MD  traMADol (ULTRAM) 50 MG  tablet Take 1 tablet by mouth every 6 hours as needed for pain. 12/18/13   Oneal Grout, MD  trimethoprim (TRIMPEX) 100 MG tablet Take 100 mg by mouth daily. 03/16/13   Historical Provider, MD   BP 114/62 mmHg  Pulse 82  Temp(Src) 97.9 F (36.6 C) (Oral)  Resp 15  SpO2 96% Physical Exam  Constitutional: She is oriented to person, place, and time. She appears well-developed and well-nourished. No distress.  HENT:  Head: Normocephalic and atraumatic.  Eyes: EOM are normal. Pupils are equal, round, and reactive to light.  Neck: Normal range of motion. Neck supple.  Cardiovascular: Normal rate, regular rhythm and intact distal pulses.   Pulmonary/Chest: Accessory muscle usage present. She has wheezes (scattered expiratory wheezes). She has rales (bibasilar).  Abdominal: Soft. She exhibits no distension. There is no tenderness.  Musculoskeletal: Normal range of motion. She exhibits no edema or tenderness.  Neurological: She is alert and oriented to person, place, and time.  Skin: Skin is warm and dry. No rash noted.  Psychiatric: She has a normal mood and affect.  Nursing note and vitals reviewed.   ED Course  Procedures (including critical care time) Labs Review Labs Reviewed  CBC - Abnormal; Notable for the following:    WBC 3.0 (*)    All other components within normal limits  COMPREHENSIVE METABOLIC PANEL - Abnormal; Notable for the following:    Sodium 133 (*)    Chloride 100 (*)    CO2 21 (*)    Glucose, Bld 136 (*)    Total Protein 6.3 (*)    All other components within normal limits  LACTIC ACID, PLASMA - Abnormal; Notable for the following:    Lactic Acid, Venous 2.3 (*)    All other components within normal limits  CULTURE, BLOOD (ROUTINE X 2)  CULTURE, BLOOD (ROUTINE X 2)  BRAIN NATRIURETIC PEPTIDE  LACTIC ACID, PLASMA  I-STAT TROPOININ, ED    Imaging Review Dg Chest 2 View  02/19/2016  CLINICAL DATA:  Shortness of breath.  Rales.  Cough. EXAM: CHEST  2 VIEW  COMPARISON:  No prior . FINDINGS: Mediastinum and hilar structures normal. Low lung volumes with mild basilar atelectasis. Increased densities in the retrocardiac region. Retrocardiac infiltrate cannot be excluded. Hiatal hernia cannot be excluded. Heart size normal. No pleural effusion or pneumothorax. Degenerative changes thoracic spine. Prior vertebroplasty . IMPRESSION: 1. Low lung volumes with mild bibasilar atelectasis. 2. Increased density in the retrocardiac region noted. This may be secondary to a retrocardiac pulmonary infiltrate and/or hiatal hernia. Follow-up chest x-ray suggested for continued evaluation. Electronically Signed   By: Maisie Fus  Register   On: 02/19/2016 12:43   I have personally reviewed and evaluated these images and lab results as part of my medical decision-making.   EKG Interpretation   Date/Time:  Thursday Feb 19 2016 11:34:49 EDT Ventricular Rate:  83 PR Interval:  203 QRS Duration: 89 QT Interval:  388 QTC Calculation: 456 R Axis:   -  29 Text Interpretation:  Sinus rhythm Borderline left axis deviation  Borderline T wave abnormalities No old tracing to compare Confirmed by  FLOYD MD, DANIEL [phone]) on 02/19/2016 12:13:47 PM      MDM   Final diagnoses:  None   Received  Solumedrol and duonebs x2 per EMS prior to arrival. On presentation, temperature 99.1 Fahrenheit rectally, normotensive and mentating appropriately, normal O2 sats on room air. Exam significant for scattered insert her x-ray wheezes with bibasilar rales. Patient is in mild respiratory distress speaking in short sentences. IV fluids given. Chest x-ray significant for retrocardiac opacity that may be due to pneumonia. EKG shows sinus rhythm with nonspecific T-wave changes. Doubt acute ischemia. BNP and troponin are unremarkable.CBC w/ leukopenia, otherwise unremarkable. CMP shows no significant abnormalities.  Lactic acid is 2.3. Cultures are pending. Patient is treated for community acquired  pneumonia with azithromycin and Rocephin. Given patient's age, likely pneumonia and lactic acidosis, family medicine consulted for admission in triage the patient.   Discussed with Dr. Adela Lank, ED attending.     Isa Rankin, MD 02/19/16 1617  Melene Plan, DO 02/20/16 1622

## 2016-02-19 NOTE — ED Notes (Signed)
Pt arrives via ems from [facility], ems reports pt c/o sob x3 days, pt reported using inhaler prior to ems arrival with no relief. Ems reported wheezing in all lung fields prior to 5mg  albuterol neb and reports rales and rhonchi in the lower lobes after treatment. Pt also given 125mg  solumedrol pta. resp e/u, NAD.

## 2016-02-19 NOTE — Progress Notes (Addendum)
Lactic 2.4. MD paged. Orders placed.

## 2016-02-19 NOTE — Progress Notes (Signed)
Gina Hodges is a 82 y.o. female patient admitted from ED awake, alert - oriented  X 4 - no acute distress noted.  VSS - Blood pressure 127/68, pulse 75, temperature 98.2 F (36.8 Hodges), temperature source Rectal, resp. rate 28, height 5\' 1"  (1.549 m), weight 73.2 kg (161 lb 6 oz), SpO2 97 %.    IV in place, occlusive dsg intact without redness.  Orientation to room, and floor completed with information packet given to patient/family.  Patient declined safety video at this time.  Admission INP armband ID verified with patient/family, and in place.   SR up x 2, fall assessment complete, with patient and family able to verbalize understanding of risk associated with falls, and verbalized understanding to call nsg before up out of bed.  Call light within reach, patient able to voice, and demonstrate understanding.  Skin, clean-dry- intact without evidence of bruising, or skin tears.   No evidence of skin break down noted on exam.     Will cont to eval and treat per MD orders.  Kendall FlackL'ESPERANCE, Gina Silveria C, RN 02/19/2016 4:18 PM

## 2016-02-19 NOTE — Progress Notes (Signed)
Report received from Mar[place], [place] [facility]

## 2016-02-19 NOTE — ED Notes (Signed)
Attempted report x1. 

## 2016-02-19 NOTE — ED Notes (Signed)
Phleb at bedside to draw labs. 

## 2016-02-19 NOTE — H&P (Signed)
Family Medicine Teaching [facility] Admission History and Physical Service Pager: [phone]  Patient name: Gina Hodges Medical record number: [id number] Date of birth: 1927/07/02 Age: 82 y.o. Gender: female  Primary Care Provider: Etta QuillBRYAN,DANIEL J, PA-C Consultants: none Code Status: DNR & DNI (discussed on admission)  Chief Complaint: SOB  Assessment and Plan: Gina Hodges is a 82 y.o. female presenting with SOB . PMH is significant for dementia, paroxismal a-fib, hypothyroidism, hyperlipidemia, GERD, GAD, osteoporosis & OA.   # Healthcare associated PNA: SOB and non-productive coughs for few days w/ subjective fevers w/ low grade fever in ED 99.43F (given age, would consider this significant). CXR shows retrocardiac opacity & low lung volumes with mild bibasilar atelectasis.  Leukopenia to 3 and lactic acidosis 2.3.  - Admit to med surg under Dr Lum BabeEniola - VS per floor protocol.   - No cardiac monitoring at this time, as patient with stable a-fib in sinus rhythm in ED - S/p Rocephin & azithromycin in the ED. O2 Sat at 94% on 2L.  - Will broaden antibiotic to cover for HCAP given patient came from nursing home.   - Started on vancomycin and cefepime per pharmacy (5/18>)  - Patient may be allergic to penicillin & sulfa abx: monitor closely - Start duonebs Q6 for wheezing/SOB - Albuterol q2 prn SOB - Since being treated as HAP, will not continue steroids at this time. - Continue trending lactic acidosis (Q 3 hrs).  - Bcx x2 pending - Continue Robitussin syrup q6 prn cough/ congestion  #Electrolyte imbalance: Slightly hyponatremic, hypochloremic likely secondary to decreased PO intake. S/p 500 mL bolus in the ED. Now with appropriate cap refills (<2 sec) and peripheral pulses bilaterally.  - mIVF: NS@ 14325mL/hr - Would consider discontinuing IVFs in am if taking good PO - am BMP  # Hypothyroidism: On levothyroxine 75 mcg QD. Last TSH not on record.  - TSH ordered - Had today's dose,  resume home dose in am  #OA: Back pain. On Acetaminophen 325 mg BID, Tramadol 50 mg Q6 PRN for pain, Naproxen 500 mg TID w/ meals - Continue Tylenol and Naproxen - Will hold Tramadol for now.  #Depression & GAD:  On celexa 20 mg QD and Alprazolam 1mg  qhs w/ 0.25mg  BID prn at facility.  - Continue celexa 20 mg QD - Will hold alprazolam and give ativan 1 mg at night time, so as not to precipitate withdrawal.   - Would consider weaning off benzodiazepines given age, will defer this to PCP. - Could consider Ativan PRN during the day.   #Hyperlipidemia - Atorvastatin 10 mg qhs  #Constipation: Patient denies being on a bowel regimen, but medical records show Senokot 8.6-50mg  tablet, 2 tablets by mouth BID. - Last BM 1 week ago. If does not move her bowel's will give her miralax/ colace PRN.  #GERD: On Omeprazole 20 mg QD. - Protonix while hospitalized  #Dementia: Likely mild. Alert and oriented, except for year. Needs assistance with ADLs. Nephew Biomedical scientist(Bill Effie ShyColeman) has MidwifeHealthcare Power of Attorney. - PT c/s  - Continue Aricept  #Paroxysmal atrial fibrillation: In NSR on cardiac monitor in ED. EKG NSR with left axis deviation. - on ASA but not on any other meds for this  #?HTN: BP controlled in ED.  On Lisinopril 1.25mg  qd at facility - Had dose this am.  Will continue tomorrow, though if BP soft would consider discontinuing entirely as patient is at risk for orthostatic hypotension given age.  FEN/GI: Heart healthy Diet/ Boost ordered TID  between meals, on mIVF, PPI Prophylaxis: Lovenox  Disposition: Admit to med surg  History of Present Illness:  Gina Hodges is a 82 y.o. female presenting with SOB. She comes to the hospital from a nursing home.  Patient notes that she started having SOB about 1 week ago.  She reports a dry cough that has been present for a few weeks.  Reports subjective fevers.  Endorses sneezing and chronic OA of her back. Reports upset stomach and constipation.  Has  not had a BM in 1 week.  She reports that she has been getting a cough syrup at the nursing home that helps but does not last long enough.   Feels anxious.  She denies dysuria, hematuria, hemoptysis, melena/hematochezia. Denies sore throat, CP, vomiting, diarrhea, orthopnea, apnea, h/o blood clots.  Reports that she usually has poor appetite but supplements with Boost because she does not have good teeth but has appetite.  Additionally, never a smoker.  Walks with assistance/ walker.  Able to feed her own self.  Needs assistance showering, dressing, finances (nephew takes care of this).  She would like [place] to [employer] (nephew/ POA) that she is here.  Review Of Systems: Per HPI with the following additions: none Otherwise the remainder of the systems were negative.  Patient Active Problem List   Diagnosis Date Noted  . Pneumonia 02/19/2016  . Ingrown nail 11/12/2013  . Compression fracture of spine (HCC) 11/07/2013  . UTI (urinary tract infection) 11/07/2013  . Sternal fracture 11/07/2013  . Unspecified hypothyroidism 11/07/2013  . Hyperlipidemia   . Dementia with behavioral problem   . GERD (gastroesophageal reflux disease)   . PAF (paroxysmal atrial fibrillation) (HCC)   . Generalized anxiety disorder 04/05/2013    Past Medical History: Past Medical History  Diagnosis Date  . Thyroid disease   . Vertigo   . Dementia   . Osteoporosis   . Hyperlipidemia   . Dementia with behavioral problem   . GERD (gastroesophageal reflux disease)   . PAF (paroxysmal atrial fibrillation) (HCC)   . Anxiety   . Ulcerative esophagitis     Past Surgical History: Past Surgical History  Procedure Laterality Date  . Appendectomy    . Cholecystectomy    . Abdominal hysterectomy      Social History: Social History  Substance Use Topics  . Smoking status: Never Smoker   . Smokeless tobacco: None  . Alcohol Use: No   Family History: Family History  Problem Relation Age of Onset  .  Depression Mother   . Bipolar disorder Daughter   . Drug abuse Son    Allergies and Medications: Allergies  Allergen Reactions  . Penicillins Other (See Comments)  . Sulfa Antibiotics   . Codeine Palpitations   No current facility-administered medications on file prior to encounter.   Current Outpatient Prescriptions on File Prior to Encounter  Medication Sig Dispense Refill  . aspirin EC 81 MG tablet Take 81 mg by mouth daily.    Marland Kitchen atorvastatin (LIPITOR) 10 MG tablet Take 1 tablet by mouth at bedtime.    . donepezil (ARICEPT) 10 MG tablet Take 10 mg by mouth daily.    Marland Kitchen levothyroxine (SYNTHROID, LEVOTHROID) 75 MCG tablet Take 1 tablet by mouth daily.    Marland Kitchen LOPERAMIDE HCL PO Take 2 mg by mouth as needed.    . loratadine (CLARITIN) 10 MG tablet Take 10 mg by mouth daily.    . Multiple Vitamin (MULTIVITAMIN) tablet Take 1 tablet by mouth daily.    Marland Kitchen  omeprazole (PRILOSEC) 20 MG capsule Take 20 mg by mouth daily.    . sennosides-docusate sodium (SENOKOT-S) 8.6-50 MG tablet Take 2 tablets by mouth 2 (two) times daily.    . traMADol (ULTRAM) 50 MG tablet Take 1 tablet by mouth every 6 hours as needed for pain. (Patient taking differently: Take 50 mg by mouth daily. ) 90 tablet 0    Objective: BP 114/67 mmHg  Pulse 80  Temp(Src) 99.1 F (37.3 C) (Rectal)  Resp 26  SpO2 94% Exam: General: Lying in bed. Having some increased work of breathing but able to converse appropriately. Eyes: PERRLA. Cataracts sgx noted on right eye. Glasses.  ENTM: Dentures in, MMM  Cardiovascular: Difficult to hear heart sounds secondary to wheezes but no murmurs or gallops apprecited Respiratory: Inspiratory and expiratory wheezes throughout, +bibasilar rales, 2L [place] in place. Abdomen: soft, Non-tender/ non-distended. + BS Skin: Some senile purpura present. No rashes noted. Extremities: +2 DP pulses, cap refill < 2 seconds.  Neuro: Alert and oriented to person, place, situation and month. Did say it was  81.  Psych: Appropriate affect. Mood stable  Labs and Imaging: CBC BMET   Recent Labs Lab 02/19/16 1250  WBC 3.0*  HGB 12.1  HCT 38.2  PLT 223    Recent Labs Lab 02/19/16 1250  NA 133*  K 3.7  CL 100*  CO2 21*  BUN 8  CREATININE 0.83  GLUCOSE 136*  CALCIUM 9.0     Dg Chest 2 View  02/19/2016  CLINICAL DATA:  Shortness of breath.  Rales.  Cough. EXAM: CHEST  2 VIEW COMPARISON:  No prior . FINDINGS: Mediastinum and hilar structures normal. Low lung volumes with mild basilar atelectasis. Increased densities in the retrocardiac region. Retrocardiac infiltrate cannot be excluded. Hiatal hernia cannot be excluded. Heart size normal. No pleural effusion or pneumothorax. Degenerative changes thoracic spine. Prior vertebroplasty . IMPRESSION: 1. Low lung volumes with mild bibasilar atelectasis. 2. Increased density in the retrocardiac region noted. This may be secondary to a retrocardiac pulmonary infiltrate and/or hiatal hernia. Follow-up chest x-ray suggested for continued evaluation. Electronically Signed   By: Maisie Fus  Register   On: 02/19/2016 12:43    Crissie Reese, Med Student 02/19/2016, 2:43 PM MS4, [place] [facility] FPTS Intern pager: [phone], text pages welcome  I have separately seen and examined the patient. I have discussed the findings and exam with Student Dr Erin Fulling and agree with the above note.  My changes/additions are outlined in BLUE.   Edras Wilford M. Nadine Counts, DO PGY-2, [facility]

## 2016-02-19 NOTE — Progress Notes (Signed)
Pharmacy Antibiotic Note  Gina Hodges is a 82 y.o. female admitted on 02/19/2016 with pneumonia.  Pharmacy has been consulted for vancomycin dosing. Afebrile, WBC low, SCr is WNL and lactic is elevated at 2.3.  Plan: - Vancomycin 1500mg  IV x 1 then 750mg  IV Q12H - F/u renal fxn, C&S, clinical status and trough at SS     Temp (24hrs), Avg:98.5 F (36.9 C), Min:97.9 F (36.6 C), Max:99.1 F (37.3 C)   Recent Labs Lab 02/19/16 1250  WBC 3.0*  CREATININE 0.83  LATICACIDVEN 2.3*    CrCl cannot be calculated (Unknown ideal weight.).    Allergies  Allergen Reactions  . Penicillins Other (See Comments)  . Sulfa Antibiotics   . Codeine Palpitations    Antimicrobials this admission: Vanc 5/18>> Cefepime 5/18>> Azithro x 1 5/18 CTX x 1 5/18  Dose adjustments this admission: N/A   Microbiology results: Pending  Thank you for allowing pharmacy to be a part of this patient's care.  Lesly Pontarelli, Drake LeachRachel Lynn 02/19/2016 4:12 PM

## 2016-02-20 DIAGNOSIS — E872 Acidosis, unspecified: Secondary | ICD-10-CM | POA: Insufficient documentation

## 2016-02-20 DIAGNOSIS — F0391 Unspecified dementia with behavioral disturbance: Secondary | ICD-10-CM

## 2016-02-20 DIAGNOSIS — F411 Generalized anxiety disorder: Secondary | ICD-10-CM

## 2016-02-20 DIAGNOSIS — K219 Gastro-esophageal reflux disease without esophagitis: Secondary | ICD-10-CM

## 2016-02-20 LAB — CBC WITH DIFFERENTIAL/PLATELET
BASOS ABS: 0 10*3/uL (ref 0.0–0.1)
BASOS PCT: 0 %
EOS ABS: 0 10*3/uL (ref 0.0–0.7)
EOS PCT: 0 %
HCT: 33.6 % — ABNORMAL LOW (ref 36.0–46.0)
Hemoglobin: 10.6 g/dL — ABNORMAL LOW (ref 12.0–15.0)
Lymphocytes Relative: 22 %
Lymphs Abs: 0.9 10*3/uL (ref 0.7–4.0)
MCH: 28.7 pg (ref 26.0–34.0)
MCHC: 31.5 g/dL (ref 30.0–36.0)
MCV: 91.1 fL (ref 78.0–100.0)
Monocytes Absolute: 0.7 10*3/uL (ref 0.1–1.0)
Monocytes Relative: 17 %
Neutro Abs: 2.4 10*3/uL (ref 1.7–7.7)
Neutrophils Relative %: 61 %
PLATELETS: 208 10*3/uL (ref 150–400)
RBC: 3.69 MIL/uL — AB (ref 3.87–5.11)
RDW: 14.8 % (ref 11.5–15.5)
WBC: 3.9 10*3/uL — AB (ref 4.0–10.5)

## 2016-02-20 LAB — BASIC METABOLIC PANEL
ANION GAP: 8 (ref 5–15)
BUN: 9 mg/dL (ref 6–20)
CO2: 22 mmol/L (ref 22–32)
Calcium: 8.2 mg/dL — ABNORMAL LOW (ref 8.9–10.3)
Chloride: 105 mmol/L (ref 101–111)
Creatinine, Ser: 0.63 mg/dL (ref 0.44–1.00)
GFR calc Af Amer: >60 mL/min (ref >60)
Glucose, Bld: 144 mg/dL — ABNORMAL HIGH (ref 65–99)
POTASSIUM: 4.2 mmol/L (ref 3.5–5.1)
SODIUM: 135 mmol/L (ref 135–145)

## 2016-02-20 LAB — BLOOD CULTURE ID PANEL (REFLEXED)
Acinetobacter baumannii: NOT DETECTED
CANDIDA ALBICANS: NOT DETECTED
CANDIDA KRUSEI: NOT DETECTED
CANDIDA PARAPSILOSIS: NOT DETECTED
CANDIDA TROPICALIS: NOT DETECTED
CARBAPENEM RESISTANCE: NOT DETECTED
Candida glabrata: NOT DETECTED
ENTEROBACTER CLOACAE COMPLEX: NOT DETECTED
ENTEROCOCCUS SPECIES: NOT DETECTED
Enterobacteriaceae species: NOT DETECTED
Escherichia coli: NOT DETECTED
Haemophilus influenzae: NOT DETECTED
KLEBSIELLA OXYTOCA: NOT DETECTED
KLEBSIELLA PNEUMONIAE: NOT DETECTED
Listeria monocytogenes: NOT DETECTED
Methicillin resistance: NOT DETECTED
Neisseria meningitidis: NOT DETECTED
PROTEUS SPECIES: NOT DETECTED
Pseudomonas aeruginosa: NOT DETECTED
STAPHYLOCOCCUS AUREUS BCID: NOT DETECTED
STREPTOCOCCUS PNEUMONIAE: NOT DETECTED
Serratia marcescens: NOT DETECTED
Staphylococcus species: DETECTED — AB
Streptococcus agalactiae: NOT DETECTED
Streptococcus pyogenes: NOT DETECTED
Streptococcus species: NOT DETECTED
VANCOMYCIN RESISTANCE: NOT DETECTED

## 2016-02-20 LAB — LACTIC ACID, PLASMA
LACTIC ACID, VENOUS: 3 mmol/L — AB (ref 0.5–2.0)
LACTIC ACID, VENOUS: 1.8 mmol/L (ref 0.5–2.0)
Lactic Acid, Venous: 1.6 mmol/L (ref 0.5–2.0)

## 2016-02-20 LAB — GLUCOSE, CAPILLARY: Glucose-Capillary: 124 mg/dL — ABNORMAL HIGH (ref 65–99)

## 2016-02-20 MED ORDER — SALINE SPRAY 0.65 % NA SOLN
1.0000 | NASAL | Status: DC | PRN
  Administered 2016-02-20 – 2016-02-21 (×3): 1 via NASAL
  Filled 2016-02-20: qty 44

## 2016-02-20 MED ORDER — TRAMADOL HCL 50 MG PO TABS
50.0000 mg | ORAL_TABLET | Freq: Every day | ORAL | Status: DC | PRN
  Administered 2016-02-22: 50 mg via ORAL
  Filled 2016-02-20: qty 1

## 2016-02-20 MED ORDER — CEFEPIME HCL 1 G IJ SOLR
1.0000 g | Freq: Two times a day (BID) | INTRAMUSCULAR | Status: DC
  Administered 2016-02-21 – 2016-02-22 (×3): 1 g via INTRAVENOUS
  Filled 2016-02-20 (×4): qty 1

## 2016-02-20 NOTE — Progress Notes (Signed)
Patient is from [facility] [phone]). Per facility, patient has a POA, Kathlen BrunswickWilliam Coleman, [phone]. CSW left him a voicemail.  Osborne Cascoadia Brenton Joines LCSWA [phone]

## 2016-02-20 NOTE — Discharge Summary (Signed)
[facility] Discharge Summary  Patient name: Gina Hodges Medical record number: [id number] Date of birth: [birth year] Age: 82 y.o. Gender: female Date of Admission: 02/19/2016  Date of Discharge: 02/24/2016 Admitting Physician: Kinnie Feil, MD  Primary Care Provider: No PCP Per Patient Consultants: PT/ Swallow  Indication for Hospitalization: dyspnea  Discharge Diagnoses/Problem List:  Principal Problem:   Healthcare-associated pneumonia Active Problems:   Generalized anxiety disorder   Hyperlipidemia   Dementia with behavioral problem   GERD (gastroesophageal reflux disease)   PAF (paroxysmal atrial fibrillation) (HCC)   Hypothyroidism   SIRS (systemic inflammatory response syndrome) (HCC)   Lactic acidosis  Disposition: SNF when able  Discharge Condition: Stable/Improved  Discharge Exam:  BP 127/60 mmHg  Pulse 78  Temp(Src) 98 F (36.7 C) (Oral)  Resp 14  Ht '5\' 1"'  (1.549 m)  Wt 161 lb 6 oz (73.2 kg)  BMI 30.51 kg/m2  SpO2 96% Gen: awake, alert, elderly white female, NAD HEENT: [place]/AT, sclera white Cardio: RRR, no murmurs Pulm: mild expiratory wheeze with intermittent expiratory rhonchi, breathing normally on room air GI: soft, NT/ND, +BS Ext: WWP, no edema Neuro: follows commands, hard of hearing, AOx2 (self and place, cannot give year) Psych: mood stable, speech normal  Brief Hospital Course:  Gina Hodges is a 82 y.o. female w/ PMH sig. for dementia, paroxismal a-fib, hypothyroidism, hyperlipidemia, GERD, GAD, osteoporosis & OA admitted on 5/18 for SOB.   SOB: Presented with 1 wk of non-productive cough, SOB and subjective fevers.  On admission, she met SIRS criteria with WBC 3, RR>20 and lactic acidosis.  Admission CXR showed retrocardiac opacity suggestive of possible PNA. Treated with  Rocephin & azithromycin in the ED. Admitted and switched to Cefepime and Vanc to cover for HCAP on 5/18 and was continued to 5/21, at which point  she was transitioned to oral levaquin to complete a 8 day dose of antibiotics. Lactic acidosis and leukopenia resolved following bolus of fluid and IV Abx. Patient also treated with Duonebs and Albuterol PRN for wheezing/SOB. Bcx (5/18) showed Gram + cocci in cluster in 1 anaerobic bottle only, coagulase positive staph likely a contaminant.  Repeat BCx (5/19): NG x3 days.  Negative MRSA by PCR. By 5/22 she has some improvement but less than expected given 4 days of Abx. Continued expiratory wheeze and rhonchi but WOB improved.  CXR repeated 2/2 epigastric pain, which showed 1. Suboptimal inspiration accounting for atelectasis in the lower lobes. No acute cardiopulmonary disease otherwise. 2. Moderately large hiatal hernia with an air-fluid level in the intrathoracic stomach.   As it was unclear if patient had a true pneumonia on admission or whether picture was confused by hiatal hernia, predisone was added on hospital day 4 for continued wheeze/ concern for asthma exacerbation. Patient breathing substantially improved the following day.  She was discharged with Prednisone to continue for 3 more days to complete a 5 day steroid burst.  Swallowing study showed "pt may be able to have thin liquids via teaspoon with full supervision from family member to cue a follow up throat clear and second swallow. Otherwise, pt is recommended to consume a dys 3 nectar thick liquid diet. Quantity of aspiration is minimal and it is questionable if aspiration plays a role in pts pulmonary function at this time. Would suggest potential diet upgrade in the near future as general strength improves. Pt will need home health SLP if planning to return to ALF. Moderate aspiration risk"    Weakness: Patient  evaluated by Physical therapy and occupational therapy.  Outpatient physical therapy recommended for ALF.  Patient was continued on her home dose medications for hypothyroidism, osteoarthritis, hyperlipidemia, GERD, dementia,  a-fib and depression.  For her anxiety, her alprazolam was changed to ativan in the setting of dyspnea.  Would consider weaning off benzodiazepines as able, as this is a BEERS list medication.   Paroxysmal atrial fibrillation: patient in NSR during hospitalization. CHADSVASC 3. HASBLED 4.1%. Anticoagulation was considered. Patient was discharged on ASA given bleeding risk and risk benefit of anti-coagulation.  Patient was discharged in stable condition back to her ALF, [facility].  Disposition: to SNF when able  Issues for Follow Up:  1. Blood pressure, Lisinopril discontinued during hospitalization. 2. Recommend that patient continue Dysphagia 3 diet 3. Continue Prednisone 34m daily starting 02/25/16 for 3 days. 4. Consider weaning off benzodiazepines given age 82 PT/ SLP recs 6. Repeat BMP in a couple of days, Na decreased some from admission  Significant Procedures: swallow study  Significant Labs and Imaging:   Recent Labs Lab 02/21/16 0725 02/22/16 0908 02/23/16 0515  WBC 7.6 8.1 8.4  HGB 11.5* 10.9* 10.4*  HCT 35.9* 33.0* 31.7*  PLT 256 132* 290    Recent Labs Lab 02/19/16 1250 02/20/16 0259 02/21/16 0725 02/22/16 0908 02/23/16 0515 02/24/16 1142  NA 133* 135 136 130* 130* 129*  K 3.7 4.2 3.4* 3.6 3.8 3.5  CL 100* 105 100* 102 100* 96*  CO2 21* 22 23 21* 23 23  GLUCOSE 136* 144* 101* 121* 94 168*  BUN '8 9 9 8 8 15  ' CREATININE 0.83 0.63 0.64 0.58 0.64 0.65  CALCIUM 9.0 8.2* 9.0 8.6* 8.6* 8.9  ALKPHOS 68  --   --   --   --   --   AST 26  --   --   --   --   --   ALT 15  --   --   --   --   --   ALBUMIN 3.7  --   --   --   --   --     Dg Chest 2 View  02/19/2016  CLINICAL DATA:  Shortness of breath.  Rales.  Cough. EXAM: CHEST  2 VIEW COMPARISON:  No prior . FINDINGS: Mediastinum and hilar structures normal. Low lung volumes with mild basilar atelectasis. Increased densities in the retrocardiac region. Retrocardiac infiltrate cannot be excluded. Hiatal hernia  cannot be excluded. Heart size normal. No pleural effusion or pneumothorax. Degenerative changes thoracic spine. Prior vertebroplasty . IMPRESSION: 1. Low lung volumes with mild bibasilar atelectasis. 2. Increased density in the retrocardiac region noted. This may be secondary to a retrocardiac pulmonary infiltrate and/or hiatal hernia. Follow-up chest x-ray suggested for continued evaluation. Electronically Signed   By: TMarcello Moores Register   On: 02/19/2016 12:43   Dg Chest 2 View  02/22/2016  CLINICAL DATA:  Followup bibasilar atelectasis versus pneumonia. EXAM: CHEST  2 VIEW COMPARISON:  02/19/2016. FINDINGS: AP semi-erect and lateral images were obtained. Cardiac silhouette normal in size for AP technique. Thoracic aorta tortuous and atherosclerotic. Moderately large hiatal hernia with an air-fluid level in the intrathoracic stomach. Hilar and mediastinal contours otherwise unremarkable. Suboptimal inspiration with atelectasis in the lower lobes. Lungs otherwise clear. Pulmonary vascularity normal. No visible pleural effusions. Prior augmentation of what I believe are T8 and T12. IMPRESSION: 1. Suboptimal inspiration accounts for atelectasis in the lower lobes. No acute cardiopulmonary disease otherwise. 2. Moderately large hiatal hernia with an  air-fluid level in the intrathoracic stomach. Electronically Signed   By: Evangeline Dakin M.D.   On: 02/22/2016 18:51   Dg Swallowing Func-speech Pathology  02/24/2016  Objective Swallowing Evaluation: Type of Study: MBS-Modified Barium Swallow Study Patient Details Name: Elira Colasanti MRN: [id number] Date of Birth: [birth year] Today's Date: 02/24/2016 Time: SLP Start Time (ACUTE ONLY): 0915-SLP Stop Time (ACUTE ONLY): 0940 SLP Time Calculation (min) (ACUTE ONLY): 25 min Past Medical History: Past Medical History Diagnosis Date . Thyroid disease  . Vertigo  . Dementia  . Osteoporosis  . Hyperlipidemia  . Dementia with behavioral problem  . GERD (gastroesophageal reflux  disease)  . PAF (paroxysmal atrial fibrillation) ([place])  . Anxiety  . Ulcerative esophagitis  Past Surgical History: Past Surgical History Procedure Laterality Date . Appendectomy   . Cholecystectomy   . Abdominal hysterectomy   HPI: Ione Sandusky is a 82 y.o. female presenting with SOB . PMH is significant for dementia, paroxismal a-fib, hypothyroidism, hyperlipidemia, GERD, GAD, osteoporosis & OA. Current chest x-ray showing Suboptimal inspiration accounts for atelectasis in the lower lobes, no acute cardiopulmonary disease otherwise. and moderately large hiatal hernia with an air-fluid level in the intrathoracic stomach.   No previous ST work noted.  Patient current diagnosed with HCAP.   Subjective: The patient was seen sitting upright in her bedside chair.  Assessment / Plan / Recommendation CHL IP CLINICAL IMPRESSIONS 02/24/2016 Therapy Diagnosis Moderate pharyngeal phase dysphagia Clinical Impression Pt presents with a moderate oropharyngeal dysphagia with primary problem bieng delayed swallow, allowing mild silent penetration and aspiration of thin liquids before the swallow. Chin tuck was ineffective, but reducing bolus size was beneficial; pt may be able to have thin liquids via teaspoon with full supervision from family member to cue a follow up throat clear and second swallow. Otherwise, pt is recommended to consume a dys 3 nectar thick liquid diet. Quantity of aspiration is minimal and it is questionable if aspiration plays a role in pts pulmonary function at this time. Would suggest potential diet upgrade in the near future as general strength improves. Pt will need home health SLP if planning to return to ALF.  Impact on safety and function Moderate aspiration risk   CHL IP TREATMENT RECOMMENDATION 02/24/2016 Treatment Recommendations Therapy as outlined in treatment plan below   Prognosis 02/24/2016 Prognosis for Safe Diet Advancement Fair Barriers to Reach Goals Cognitive deficits Barriers/Prognosis  Comment -- CHL IP DIET RECOMMENDATION 02/24/2016 SLP Diet Recommendations Dysphagia 3 (Mech soft) solids;Nectar thick liquid Liquid Administration via Cup;No straw Medication Administration Whole meds with puree Compensations Slow rate;Small sips/bites;Clear throat intermittently Postural Changes Remain semi-upright after after feeds/meals (Comment);Seated upright at 90 degrees   CHL IP OTHER RECOMMENDATIONS 02/24/2016 Recommended Consults -- Oral Care Recommendations Oral care BID Other Recommendations --   CHL IP FOLLOW UP RECOMMENDATIONS 02/24/2016 Follow up Recommendations Home health SLP   CHL IP FREQUENCY AND DURATION 02/24/2016 Speech Therapy Frequency (ACUTE ONLY) min 2x/week Treatment Duration 2 weeks      CHL IP ORAL PHASE 02/24/2016 Oral Phase WFL Oral - Pudding Teaspoon -- Oral - Pudding Cup -- Oral - Honey Teaspoon -- Oral - Honey Cup -- Oral - Nectar Teaspoon -- Oral - Nectar Cup -- Oral - Nectar Straw -- Oral - Thin Teaspoon -- Oral - Thin Cup -- Oral - Thin Straw -- Oral - Puree -- Oral - Mech Soft -- Oral - Regular -- Oral - Multi-Consistency -- Oral - Pill -- Oral Phase - Comment --  CHL  IP PHARYNGEAL PHASE 02/24/2016 Pharyngeal Phase Impaired Pharyngeal- Pudding Teaspoon -- Pharyngeal -- Pharyngeal- Pudding Cup -- Pharyngeal -- Pharyngeal- Honey Teaspoon -- Pharyngeal -- Pharyngeal- Honey Cup -- Pharyngeal -- Pharyngeal- Nectar Teaspoon -- Pharyngeal -- Pharyngeal- Nectar Cup Delayed swallow initiation-vallecula Pharyngeal -- Pharyngeal- Nectar Straw Delayed swallow initiation-pyriform sinuses;Penetration/Aspiration before swallow Pharyngeal Material enters airway, remains ABOVE vocal cords and not ejected out Pharyngeal- Thin Teaspoon Delayed swallow initiation-vallecula Pharyngeal -- Pharyngeal- Thin Cup Delayed swallow initiation-pyriform sinuses;Penetration/Aspiration before swallow Pharyngeal Material enters airway, passes BELOW cords without attempt by patient to eject out (silent aspiration)  Pharyngeal- Thin Straw Delayed swallow initiation-pyriform sinuses;Compensatory strategies attempted (with notebox);Penetration/Aspiration before swallow Pharyngeal Material enters airway, CONTACTS cords and not ejected out Pharyngeal- Puree Delayed swallow initiation-vallecula Pharyngeal -- Pharyngeal- Mechanical Soft -- Pharyngeal -- Pharyngeal- Regular -- Pharyngeal -- Pharyngeal- Multi-consistency -- Pharyngeal -- Pharyngeal- Pill Delayed swallow initiation-vallecula Pharyngeal -- Pharyngeal Comment --  No flowsheet data found. CHL IP GO 02/23/2016 Functional Assessment Tool Used ASHA NOMS and clinical judgment.   Functional Limitations Swallowing Swallow Current Status [phone] CJ Swallow Goal Status (B8466) CJ Swallow Discharge Status [phone]) (None) Motor Speech Current Status [phone]) (None) Motor Speech Goal Status [phone]) (None) Motor Speech Goal Status [phone]) (None) Spoken Language Comprehension Current Status [phone]) (None) Spoken Language Comprehension Goal Status (A2633) (None) Spoken Language Comprehension Discharge Status [phone]) (None) Spoken Language Expression Current Status [phone]) (None) Spoken Language Expression Goal Status [phone]) (None) Spoken Language Expression Discharge Status [phone]) (None) Attention Current Status (T1572) (None) Attention Goal Status (I2035) (None) Attention Discharge Status [phone]) (None) Memory Current Status (U3845) (None) Memory Goal Status (X6468) (None) Memory Discharge Status (E3212) (None) Voice Current Status (Y4825) (None) Voice Goal Status (O0370) (None) Voice Discharge Status [phone]) (None) Other Speech-Language Pathology Functional Limitation [phone]) (None) Other Speech-Language Pathology Functional Limitation Goal Status [phone]) (None) Other Speech-Language Pathology Functional Limitation Discharge Status [phone]) (None) Herbie Baltimore, MA CCC-SLP [phone] [place], Katherene Ponto 02/24/2016, 9:54 AM                Results/Tests Pending at Time of  Discharge: none  Discharge Medications:    Medication List    STOP taking these medications        lisinopril 2.5 MG tablet  Commonly known as:  PRINIVIL,ZESTRIL      TAKE these medications        acetaminophen 325 MG tablet  Commonly known as:  TYLENOL  Take 650 mg by mouth 2 (two) times daily.     albuterol 108 (90 Base) MCG/ACT inhaler  Commonly known as:  PROVENTIL HFA;VENTOLIN HFA  Inhale 2 puffs into the lungs every 6 (six) hours as needed for wheezing or shortness of breath.     ALPRAZolam 0.25 MG tablet  Commonly known as:  XANAX  Take 0.25-1 mg by mouth 2 (two) times daily. Pt takes 0.23m in the morning and 161mat bedtime     aspirin EC 81 MG tablet  Take 81 mg by mouth daily.     atorvastatin 10 MG tablet  Commonly known as:  LIPITOR  Take 1 tablet by mouth at bedtime.     benzonatate 100 MG capsule  Commonly known as:  TESSALON  Take 1 capsule (100 mg total) by mouth 3 (three) times daily as needed for cough.     CALAZIME SKIN PROTECTANT EX  Apply 1 application topically at bedtime. To coccyx     citalopram 20 MG tablet  Commonly known as:  CELEXA  Take 20 mg  by mouth daily.     donepezil 10 MG tablet  Commonly known as:  ARICEPT  Take 10 mg by mouth daily.     guaifenesin 100 MG/5ML syrup  Commonly known as:  ROBITUSSIN  Take 200 mg by mouth 4 (four) times daily - after meals and at bedtime.     levothyroxine 75 MCG tablet  Commonly known as:  SYNTHROID, LEVOTHROID  Take 1 tablet by mouth daily.     LOPERAMIDE HCL PO  Take 2 mg by mouth as needed.     loratadine 10 MG tablet  Commonly known as:  CLARITIN  Take 10 mg by mouth daily.     multivitamin tablet  Take 1 tablet by mouth daily.     naproxen 500 MG tablet  Commonly known as:  NAPROSYN  Take 500 mg by mouth 3 (three) times daily with meals.     omeprazole 20 MG capsule  Commonly known as:  PRILOSEC  Take 20 mg by mouth daily.     predniSONE 50 MG tablet  Commonly known as:   DELTASONE  Take 1 tablet (50 mg total) by mouth daily with breakfast. x3 days.  START 02/25/16  Start taking on:  02/25/2016     promethazine 25 MG tablet  Commonly known as:  PHENERGAN  Take 25 mg by mouth every 6 (six) hours as needed for nausea or vomiting.     sennosides-docusate sodium 8.6-50 MG tablet  Commonly known as:  SENOKOT-S  Take 2 tablets by mouth 2 (two) times daily.     traMADol 50 MG tablet  Commonly known as:  ULTRAM  Take 1 tablet by mouth every 6 hours as needed for pain.        Discharge Instructions: Please refer to Patient Instructions section of EMR for full details.  Patient was counseled important signs and symptoms that should prompt return to medical care, changes in medications, dietary instructions, activity restrictions, and follow up appointments.     Hershal Coria 02/24/2016, 12:54 PM MS4, [place]  I have separately seen and examined the patient. I have discussed the findings and exam with Student Dr Rosalia Hammers and agree with the above note.  My changes/additions are outlined in BLUE.   Rosaire Cueto M. Lajuana Ripple, DO PGY-2, [place]

## 2016-02-20 NOTE — Progress Notes (Signed)
CRITICAL VALUE ALERT  Critical value received:  Lactic acid=5.4  Date of notification:  02/19/16  Time of notification:  2211  Critical value read back:Yes.    Nurse who received alert:  Randell LoopSonia Kartel Wolbert  MD notified (1st page):  Dr.[email or web address]  Time of first page:  2215  MD notified (2nd page):  Time of second page:  Responding MD:  Dr Jonathon Jordangambino  Time MD responded:2230

## 2016-02-20 NOTE — Progress Notes (Signed)
PT Cancellation Note  Patient Details Name: Gina Hodges MRN: [id number] DOB: 02-18-1927   Cancelled Treatment:    Reason Eval/Treat Not Completed: Patient declined, no reason specified ("I feel bad all over").  Will try later as pt and time allow.   Ivar DrapeStout, Soffia Doshier E 02/20/2016, 10:18 AM    Samul Dadauth Bren Steers, PT MS Acute Rehab Dept. Number: [facility] [phone] and [facility] [phone]

## 2016-02-20 NOTE — Progress Notes (Signed)
Family Medicine Teaching Service Daily Progress Note Intern Pager: [phone]  Patient name: Gina Hodges Medical record number: [id number] Date of birth: 08-13-1927 Age: 82 y.o. Gender: female  Primary Care Provider: No PCP Per Patient Consultants: None Code Status: DNR & DNI  Pt Overview and Major Events to Date:  None  Assessment and Plan: Gina NoraLora Steadham is a 82 y.o. female admitted for SOB . PMH is significant for dementia, paroxismal a-fib, hypothyroidism, hyperlipidemia, GERD, GAD, osteoporosis & OA.   # Healthcare associated PNA: Doing better this am.  Now on room air.  Wheezes improving but still audible. - Lactic acid trended to 5.4 and after 1 L of fluid went to 3.0 and now at 1.6. Repeat pending - Bcx: in progress. Preliminary shows Gram + cocci in cluster in 1 anaerobic bottle only. Blood culture pending. Will rpt bcx since vanc was started - Urine legionella & Strep A pending collection - Continue on vancomycin and cefepime per pharmacy (5/18>) for HCAP - Patient may be allergic to penicillin & sulfa abx: monitor closely. No reactions thus far.  - Duonebs Q6 for wheezing/SOB - Albuterol q2 prn SOB: Only received 1 dose at 1720 last night  - Negative MRSA by PCR - Continue Robitussin syrup q6 prn cough/ congestion - Weaned off O2, SpO2 95 on RA  #Electrolyte imbalance: Resolved -D/c IVF  # Hypothyroidism: On levothyroxine 75 mcg QD. TSH 0.525  - On home dose   #OA: Back pain. On Acetaminophen 325 mg BID, Tramadol 50 mg Q6 PRN for pain, Naproxen 500 mg TID w/ meals - Continue Tylenol and Naproxen - Patient complaining of increased back pain today. Restart home Tramadol.  #Depression & GAD: On celexa 20 mg QD and Alprazolam 1mg  qhs w/ 0.25mg  BID prn at facility.  - Continue celexa 20 mg QD - Holding alprazolam and give ativan 1 mg at night time, so as not to precipitate withdrawal.  - Would consider weaning off benzodiazepines given age, will defer this to  PCP. - Could consider Ativan PRN during the day.   #Hyperlipidemia - Atorvastatin 10 mg qhs  #Constipation: resolved. - Had incontinent bowel movement today  #GERD: On Omeprazole 20 mg QD. - Protonix while hospitalized  #Dementia: Likely mild. Alert and oriented, except for year. Needs assistance with ADLs. Nephew Biomedical scientist(Bill Effie ShyColeman) has MidwifeHealthcare Power of Attorney. - PT c/s  - Continue Aricept  #Paroxysmal atrial fibrillation: In NSR on cardiac monitor in ED. EKG NSR with left axis deviation. - on ASA but not on any other meds for this  #?HTN: On Lisinopril 1.25mg  qd at facility (BPs 107-134/59-62) - Consider discontinuing entirely as patient is at risk for orthostatic hypotension given age.  FEN/GI: Heart healthy Diet/ Boost ordered TID between meals, PPI Prophylaxis: Lovenox   Disposition: to SNF when able  Subjective:  Patient reports feeling worse than yesterday. Woke up w/ & currently has HA. States her breathing is worse today. Also endorses lower right quadrant pain today. Denies CP, pleuritic pain or other pain.   Objective: Temp:  [97.8 F (36.6 C)-99.1 F (37.3 C)] 97.8 F (36.6 C) (05/19 0458) Pulse Rate:  [74-94] 88 (05/19 0458) Resp:  [14-28] 18 (05/19 0458) BP: (107-134)/(46-68) 134/62 mmHg (05/19 0458) SpO2:  [91 %-97 %] 91 % (05/19 0458) Weight:  [73.2 kg (161 lb 6 oz)] 73.2 kg (161 lb 6 oz) (05/18 1614)  Physical Exam: General: Patient lying in bed. In no apparent distress.  Cardiovascular: Difficult to appreciate heart sounds. No murmurs  appreciated Respiratory: Expiratory wheezes. Unable to auscultate posterior 2/2 patient limited mobility.  Abdomen: localized TTP on RLQ. Otherwise nondistended. No peritonitis.   Laboratory:  Recent Labs Lab 02/19/16 1250 02/20/16 0259  WBC 3.0* 3.9*  HGB 12.1 10.6*  HCT 38.2 33.6*  PLT 223 208    Recent Labs Lab 02/19/16 1250 02/20/16 0259  NA 133* 135  K 3.7 4.2  CL 100* 105  CO2 21* 22  BUN 8 9   CREATININE 0.83 0.63  CALCIUM 9.0 8.2*  PROT 6.3*  --   BILITOT 0.5  --   ALKPHOS 68  --   ALT 15  --   AST 26  --   GLUCOSE 136* 144*   Results for orders placed or performed during the hospital encounter of 02/19/16  Blood culture (routine x 2)     Status: None (Preliminary result)   Collection Time: 02/19/16  1:42 PM  Result Value Ref Range Status   Specimen Description BLOOD LEFT WRIST  Final   Special Requests BOTTLES DRAWN AEROBIC AND ANAEROBIC 5CC  Final   Culture  Setup Time   Final    GRAM POSITIVE COCCI IN CLUSTERS ANAEROBIC BOTTLE ONLY Organism ID to follow    Culture PENDING  Incomplete   Report Status PENDING  Incomplete  MRSA PCR Screening     Status: None   Collection Time: 02/19/16  4:18 PM  Result Value Ref Range Status   MRSA by PCR NEGATIVE NEGATIVE Final    Comment:        The GeneXpert MRSA Assay (FDA approved for NASAL specimens only), is one component of a comprehensive MRSA colonization surveillance program. It is not intended to diagnose MRSA infection nor to guide or monitor treatment for MRSA infections.     Imaging/Diagnostic Tests: None   Crissie Reese, Med Student 02/20/2016, 8:35 AM MS4, [facility] Family Medicine FPTS Intern pager: [phone], text pages welcome  I have separately seen and examined the patient. I have discussed the findings and exam with  Student Dr Erin Fulling and agree with the above note.  My changes/additions are outlined in BLUE.  Reports that she does not feel well today.  She feels like her breathing might be worse today, though recognizes that she is off O2.  She notes that she continues to feel like she can't get a good breath in.  She notes she had a BM.  She reports good appetite.  CHADSVASC 3.  HASBLED 4.1%.  Will discuss with POA consideration for anticoagulation in the setting of PAF.  Lisinopril 1.25mg  dc'd in the setting of normotension, no h/o DM2.  Anyeli Hockenbury M. Nadine Counts, DO PGY-2, [facility] Family  Medicine

## 2016-02-20 NOTE — Care Management Note (Signed)
Case Management Note  Patient Details  Name: Durwin NoraLora Kreuzer MRN: [id number] Date of Birth: 02-Sep-1927  Subjective/Objective:                 Spoke with patient in the room. She states that she plans on going back to [facility] at discharge. Adm with PNA. IVF and IV Abx.   Action/Plan:  Anticipate DC to SNF when medically stable as facilitated by CSW.  Expected Discharge Date:                  Expected Discharge Plan:  Skilled Nursing Facility (From [facility])  In-House Referral:  Clinical Social Work  Discharge planning Services     Post Acute Care Choice:    Choice offered to:     DME Arranged:    DME Agency:     HH Arranged:    HH Agency:     Status of Service:  In process, will continue to follow  Medicare Important Message Given:    Date Medicare IM Given:    Medicare IM give by:    Date Additional Medicare IM Given:    Additional Medicare Important Message give by:     If discussed at Long Length of Stay Meetings, dates discussed:    Additional Comments:  Lawerance SabalDebbie Tam Delisle, RN 02/20/2016, 9:44 AM

## 2016-02-20 NOTE — Progress Notes (Signed)
  PHARMACY - PHYSICIAN COMMUNICATION CRITICAL VALUE ALERT - BLOOD CULTURE IDENTIFICATION (BCID)  Results for orders placed or performed during the hospital encounter of 02/19/16  Blood Culture ID Panel (Reflexed) (Collected: 02/19/2016  1:42 PM)  Result Value Ref Range   Enterococcus species NOT DETECTED NOT DETECTED   Vancomycin resistance NOT DETECTED NOT DETECTED   Listeria monocytogenes NOT DETECTED NOT DETECTED   Staphylococcus species DETECTED (A) NOT DETECTED   Staphylococcus aureus NOT DETECTED NOT DETECTED   Methicillin resistance NOT DETECTED NOT DETECTED   Streptococcus species NOT DETECTED NOT DETECTED   Streptococcus agalactiae NOT DETECTED NOT DETECTED   Streptococcus pneumoniae NOT DETECTED NOT DETECTED   Streptococcus pyogenes NOT DETECTED NOT DETECTED   Acinetobacter baumannii NOT DETECTED NOT DETECTED   Enterobacteriaceae species NOT DETECTED NOT DETECTED   Enterobacter cloacae complex NOT DETECTED NOT DETECTED   Escherichia coli NOT DETECTED NOT DETECTED   Klebsiella oxytoca NOT DETECTED NOT DETECTED   Klebsiella pneumoniae NOT DETECTED NOT DETECTED   Proteus species NOT DETECTED NOT DETECTED   Serratia marcescens NOT DETECTED NOT DETECTED   Carbapenem resistance NOT DETECTED NOT DETECTED   Haemophilus influenzae NOT DETECTED NOT DETECTED   Neisseria meningitidis NOT DETECTED NOT DETECTED   Pseudomonas aeruginosa NOT DETECTED NOT DETECTED   Candida albicans NOT DETECTED NOT DETECTED   Candida glabrata NOT DETECTED NOT DETECTED   Candida krusei NOT DETECTED NOT DETECTED   Candida parapsilosis NOT DETECTED NOT DETECTED   Candida tropicalis NOT DETECTED NOT DETECTED    [age] YOM who presented on 5/18 from [facility] with SOB/cough concerning for HCAP and was started empirically on Vancomycin + Cefepime. 1/2 BCx have resulted as staph species - NOT staph aureus - which likely represents coagulase negative staph and a contaminant. No changes in antibiotic therapy recommended at  this time.  Name of physician (or Provider) Contacted: Dr. Nancy MarusMayo  Changes to prescribed antibiotics required: None  Georgina PillionElizabeth Raequon Catanzaro, PharmD, BCPS Clinical Pharmacist Pager: [phone] 02/20/2016 9:38 AM

## 2016-02-20 NOTE — Clinical Social Work Note (Signed)
Clinical Social Work Assessment  Patient Details  Name: Gina Hodges MRN: [id number] Date of Birth: 1927-02-03  Date of referral:  02/20/16               Reason for consult:  Facility Placement                Permission sought to share information with:  Facility Medical sales representativeContact Representative, Family Supports Permission granted to share information::  Yes, Verbal Permission Granted  Name::     [facility]  Agency::  [facility]  Relationship::  Jacobs Engineeringephew  Contact Information:  [phone]  Housing/Transportation Living arrangements for the past 2 months:  Assisted Living Facility Source of Information:  Patient, Other (Comment Required) (Diane) Patient Interpreter Needed:  None Criminal Activity/Legal Involvement Pertinent to Current Situation/Hospitalization:  No - Comment as needed Significant Relationships:  Other Family Members Lives with:  Self Do you feel safe going back to the place where you live?  Yes Need for family participation in patient care:  Yes (Comment)  Care giving concerns:  CSW received referral for patient to return to ALF. CSW spoke with patient and patient's family member, Diane, at bedside. She stated that patient's POA is [facility]. Patient is from [facility] and plans to return there at discharge. CSW to continue to follow and assist with discharge planning needs.   Social Worker assessment / plan: Patient plans to return to ALF at discharge.  Employment status:  Retired Database administratornsurance information:  Managed Medicare PT Recommendations:  Home with Home Health Information / Referral to community resources:     Patient/Family's Response to care:  Diane stated that she can take patient over to ALF if Chrissie NoaWilliam is unable to. Patient is hopeful that she will feel better soon.  Patient/Family's Understanding of and Emotional Response to Diagnosis, Current Treatment, and Prognosis:  No questions/concerns.  Emotional Assessment Appearance:  Appears stated  age Attitude/Demeanor/Rapport:   (Appropriate) Affect (typically observed):  Accepting Orientation:  Oriented to Self, Oriented to Place Alcohol / Substance use:  Not Applicable Psych involvement (Current and /or in the community):  No (Comment)  Discharge Needs  Concerns to be addressed:  Care Coordination Readmission within the last 30 days:  No Current discharge risk:  None Barriers to Discharge:  Continued Medical Work up   [employer] S Joselin Crandell, LCSWA 02/20/2016, 4:41 PM

## 2016-02-21 LAB — CBC
HEMATOCRIT: 35.9 % — AB (ref 36.0–46.0)
Hemoglobin: 11.5 g/dL — ABNORMAL LOW (ref 12.0–15.0)
MCH: 29.6 pg (ref 26.0–34.0)
MCHC: 32 g/dL (ref 30.0–36.0)
MCV: 92.3 fL (ref 78.0–100.0)
Platelets: 256 10*3/uL (ref 150–400)
RBC: 3.89 MIL/uL (ref 3.87–5.11)
RDW: 14.9 % (ref 11.5–15.5)
WBC: 7.6 10*3/uL (ref 4.0–10.5)

## 2016-02-21 LAB — BASIC METABOLIC PANEL
ANION GAP: 13 (ref 5–15)
BUN: 9 mg/dL (ref 6–20)
CALCIUM: 9 mg/dL (ref 8.9–10.3)
CO2: 23 mmol/L (ref 22–32)
Chloride: 100 mmol/L — ABNORMAL LOW (ref 101–111)
Creatinine, Ser: 0.64 mg/dL (ref 0.44–1.00)
GFR calc Af Amer: >60 mL/min (ref >60)
GFR calc non Af Amer: >60 mL/min (ref >60)
GLUCOSE: 101 mg/dL — AB (ref 65–99)
Potassium: 3.4 mmol/L — ABNORMAL LOW (ref 3.5–5.1)
Sodium: 136 mmol/L (ref 135–145)

## 2016-02-21 MED ORDER — IPRATROPIUM-ALBUTEROL 0.5-2.5 (3) MG/3ML IN SOLN
3.0000 mL | Freq: Four times a day (QID) | RESPIRATORY_TRACT | Status: DC
  Administered 2016-02-21 – 2016-02-22 (×7): 3 mL via RESPIRATORY_TRACT
  Filled 2016-02-21 (×8): qty 3

## 2016-02-21 MED ORDER — SODIUM CHLORIDE 0.9 % IV BOLUS (SEPSIS)
500.0000 mL | Freq: Once | INTRAVENOUS | Status: AC
  Administered 2016-02-21: 500 mL via INTRAVENOUS

## 2016-02-21 MED ORDER — ONDANSETRON HCL 4 MG PO TABS
4.0000 mg | ORAL_TABLET | Freq: Three times a day (TID) | ORAL | Status: DC | PRN
  Administered 2016-02-22 – 2016-02-23 (×2): 4 mg via ORAL
  Filled 2016-02-21 (×3): qty 1

## 2016-02-21 NOTE — Care Management Important Message (Signed)
Important Message  Patient Details  Name: Gina Hodges MRN: [id number] Date of Birth: April 11, 1927   Medicare Important Message Given:  Yes    Anavictoria Wilk Abena 02/21/2016, 11:12 AM

## 2016-02-21 NOTE — Progress Notes (Signed)
PT Cancellation Note  Patient Details Name: Gina Hodges MRN: [id number] DOB: 11-18-1926   Cancelled Treatment:    Reason Eval/Treat Not Completed: Patient not medically ready. On arrival pt reported she just vomited. Patient indeed covered in vomitus. Notified RN and NT. Will reattempt as appropriate.   Gina Hodges 02/21/2016, 10:47 AM  Pager [phone]

## 2016-02-21 NOTE — Progress Notes (Signed)
Patient complaining of shortness of breath with audible wheezing heard on exhalation. O2 sat 96% on room air. Put on 2L nasal cannula for comfort. Will continue to monitor.

## 2016-02-21 NOTE — Progress Notes (Signed)
Family Medicine Teaching Service Daily Progress Note Intern Pager: [phone]  Patient name: Gina Hodges Medical record number: [id number] Date of birth: 1927/03/23 Age: 82 y.o. Gender: female  Primary Care Provider: No PCP Per Patient Consultants: None Code Status: DNR & DNI  Pt Overview and Major Events to Date:  None  Assessment and Plan: Gina Hodges is a 82 y.o. female admitted for SOB . PMH is significant for dementia, paroxismal a-fib, hypothyroidism, hyperlipidemia, GERD, GAD, osteoporosis & OA.   # Healthcare associated PNA: Patient denies dyspnea and SOB. Patient on room air.  Elevated  Lactic acid on admission  5.4> fluid bolus> 1.6>1.8   - Bcx:  with Gram + cocci in cluster in 1 anaerobic bottle only, coagulase positive staphy  - Urine legionella & Strep A pending collection - Continue on vancomycin and cefepime per pharmacy (5/18>) for HCAP - Will transition to PO Levaquin tomorrow  - Duonebs Q6 and Albuterol q2 prn for wheezing/SOB - Negative MRSA by PCR - Continue Robitussin syrup q6 prn cough/ congestion  # Hypothyroidism: On levothyroxine 75 mcg QD. TSH 0.525  - On home dose   #OA: Back pain. On Acetaminophen 325 mg BID, Tramadol 50 mg Q6 PRN for pain, Naproxen 500 mg TID w/ meals - Continue Tylenol and Naproxen - Home Tramadol.  #Depression & GAD: On celexa 20 mg QD and Alprazolam  qhs w/ 0.25mg  BID prn at facility.  - Continue celexa 20 mg QD - Holding alprazolam and give ativan 1 mg at night time,  - Would consider weaning off benzodiazepines given age, will defer this to PCP. - Could consider Ativan PRN during the day.   #Hyperlipidemia - Atorvastatin 10 mg qhs  #Constipation: resolved. - Had incontinent bowel movement today  #GERD: On Omeprazole 20 mg QD. - Protonix while hospitalized  #Dementia: Likely mild. Alert and oriented, except for year. Needs assistance with ADLs. Nephew Biomedical scientist Effie Shy) has Midwife. - PT  c/s  - Continue Aricept  #Paroxysmal atrial fibrillation: In NSR on cardiac monitor in ED. EKG NSR with left axis deviation. CHADSVASC 3.  HASBLED 4.1%.  - on ASA but not on any other meds for this  #?HTN: On Lisinopril 1.25mg  qd at facility (BP 156/92) - Consider discontinuing entirely as patient is at risk for orthostatic hypotension given age.  FEN/GI: Heart healthy Diet/ Boost ordered TID between meals, PPI Prophylaxis: Lovenox   Disposition: to SNF when able  Subjective:  Patient denies SOB, she states she is feeling better.  Objective: Temp:  [98.3 F (36.8 C)-99.1 F (37.3 C)] 99.1 F (37.3 C) (05/20 0456) Pulse Rate:  [74-105] 105 (05/20 0456) Resp:  [20] 20 (05/20 0456) BP: (99-156)/(43-92) 156/92 mmHg (05/20 0456) SpO2:  [93 %-99 %] 94 % (05/20 0716)  Physical Exam: General: Patient lying in bed. In no apparent distress.  Cardiovascular: RRR. No murmurs, rubs, or gallops  Respiratory: Coarse upper airway sounds,   Abdomen: BS+, no ttp, no distention   Laboratory:  Recent Labs Lab 02/19/16 1250 02/20/16 0259  WBC 3.0* 3.9*  HGB 12.1 10.6*  HCT 38.2 33.6*  PLT 223 208    Recent Labs Lab 02/19/16 1250 02/20/16 0259  NA 133* 135  K 3.7 4.2  CL 100* 105  CO2 21* 22  BUN 8 9  CREATININE 0.83 0.63  CALCIUM 9.0 8.2*  PROT 6.3*  --   BILITOT 0.5  --   ALKPHOS 68  --   ALT 15  --  AST 26  --   GLUCOSE 136* 144*   Results for orders placed or performed during the hospital encounter of 02/19/16  Blood culture (routine x 2)     Status: None (Preliminary result)   Collection Time: 02/19/16  1:35 PM  Result Value Ref Range Status   Specimen Description BLOOD LEFT HAND  Final   Special Requests BOTTLES DRAWN AEROBIC ONLY 5CC  Final   Culture NO GROWTH < 24 HOURS  Final   Report Status PENDING  Incomplete  Blood culture (routine x 2)     Status: Abnormal   Collection Time: 02/19/16  1:42 PM  Result Value Ref Range Status   Specimen Description  BLOOD LEFT WRIST  Final   Special Requests BOTTLES DRAWN AEROBIC AND ANAEROBIC 5CC  Final   Culture  Setup Time   Final    GRAM POSITIVE COCCI IN CLUSTERS ANAEROBIC BOTTLE ONLY Organism ID to follow CRITICAL RESULT CALLED TO, READ BACK BY AND VERIFIED WITH: E. MARTIN, PHARM D AT 0921 ON [id number] BY Lucienne CapersS. YARBROUGH    Culture (A)  Final    STAPHYLOCOCCUS SPECIES (COAGULASE NEGATIVE) THE SIGNIFICANCE OF ISOLATING THIS ORGANISM FROM A SINGLE SET OF BLOOD CULTURES WHEN MULTIPLE SETS ARE DRAWN IS UNCERTAIN. PLEASE NOTIFY THE MICROBIOLOGY DEPARTMENT WITHIN ONE WEEK IF SPECIATION AND SENSITIVITIES ARE REQUIRED.    Report Status 02/21/2016 FINAL  Final  Blood Culture ID Panel (Reflexed)     Status: Abnormal   Collection Time: 02/19/16  1:42 PM  Result Value Ref Range Status   Enterococcus species NOT DETECTED NOT DETECTED Final   Vancomycin resistance NOT DETECTED NOT DETECTED Final   Listeria monocytogenes NOT DETECTED NOT DETECTED Final   Staphylococcus species DETECTED (A) NOT DETECTED Final    Comment: CRITICAL RESULT CALLED TO, READ BACK BY AND VERIFIED WITH: Daphine Deutscher. MARTIN, PHARM D AT 0921 ON [id number] BY S. YARBROUGH    Staphylococcus aureus NOT DETECTED NOT DETECTED Final   Methicillin resistance NOT DETECTED NOT DETECTED Final   Streptococcus species NOT DETECTED NOT DETECTED Final   Streptococcus agalactiae NOT DETECTED NOT DETECTED Final   Streptococcus pneumoniae NOT DETECTED NOT DETECTED Final   Streptococcus pyogenes NOT DETECTED NOT DETECTED Final   Acinetobacter baumannii NOT DETECTED NOT DETECTED Final   Enterobacteriaceae species NOT DETECTED NOT DETECTED Final   Enterobacter cloacae complex NOT DETECTED NOT DETECTED Final   Escherichia coli NOT DETECTED NOT DETECTED Final   Klebsiella oxytoca NOT DETECTED NOT DETECTED Final   Klebsiella pneumoniae NOT DETECTED NOT DETECTED Final   Proteus species NOT DETECTED NOT DETECTED Final   Serratia marcescens NOT DETECTED NOT DETECTED Final    Carbapenem resistance NOT DETECTED NOT DETECTED Final   Haemophilus influenzae NOT DETECTED NOT DETECTED Final   Neisseria meningitidis NOT DETECTED NOT DETECTED Final   Pseudomonas aeruginosa NOT DETECTED NOT DETECTED Final   Candida albicans NOT DETECTED NOT DETECTED Final   Candida glabrata NOT DETECTED NOT DETECTED Final   Candida krusei NOT DETECTED NOT DETECTED Final   Candida parapsilosis NOT DETECTED NOT DETECTED Final   Candida tropicalis NOT DETECTED NOT DETECTED Final  MRSA PCR Screening     Status: None   Collection Time: 02/19/16  4:18 PM  Result Value Ref Range Status   MRSA by PCR NEGATIVE NEGATIVE Final    Comment:        The GeneXpert MRSA Assay (FDA approved for NASAL specimens only), is one component of a comprehensive MRSA colonization surveillance program. It is  not intended to diagnose MRSA infection nor to guide or monitor treatment for MRSA infections.     Imaging/Diagnostic Tests: None   Diarra Kos Mayra Reel, MD 02/21/2016, 8:03 AM PGY-1, [facility] Family Medicine

## 2016-02-22 ENCOUNTER — Inpatient Hospital Stay (HOSPITAL_COMMUNITY): Payer: Medicare HMO

## 2016-02-22 LAB — CBC
HEMATOCRIT: 33 % — AB (ref 36.0–46.0)
HEMOGLOBIN: 10.9 g/dL — AB (ref 12.0–15.0)
MCH: 29.6 pg (ref 26.0–34.0)
MCHC: 33 g/dL (ref 30.0–36.0)
MCV: 89.7 fL (ref 78.0–100.0)
Platelets: 132 10*3/uL — ABNORMAL LOW (ref 150–400)
RBC: 3.68 MIL/uL — AB (ref 3.87–5.11)
RDW: 14.8 % (ref 11.5–15.5)
WBC: 8.1 10*3/uL (ref 4.0–10.5)

## 2016-02-22 LAB — BASIC METABOLIC PANEL
ANION GAP: 7 (ref 5–15)
BUN: 8 mg/dL (ref 6–20)
CALCIUM: 8.6 mg/dL — AB (ref 8.9–10.3)
CO2: 21 mmol/L — ABNORMAL LOW (ref 22–32)
Chloride: 102 mmol/L (ref 101–111)
Creatinine, Ser: 0.58 mg/dL (ref 0.44–1.00)
Glucose, Bld: 121 mg/dL — ABNORMAL HIGH (ref 65–99)
POTASSIUM: 3.6 mmol/L (ref 3.5–5.1)
SODIUM: 130 mmol/L — AB (ref 135–145)

## 2016-02-22 LAB — CULTURE, BLOOD (ROUTINE X 2)

## 2016-02-22 MED ORDER — BENZONATATE 100 MG PO CAPS
100.0000 mg | ORAL_CAPSULE | Freq: Three times a day (TID) | ORAL | Status: DC | PRN
  Administered 2016-02-22 – 2016-02-23 (×3): 100 mg via ORAL
  Filled 2016-02-22 (×3): qty 1

## 2016-02-22 MED ORDER — LEVOFLOXACIN 750 MG PO TABS: 750.0000 mg | ORAL_TABLET | Freq: Every day | ORAL | Status: DC

## 2016-02-22 MED ORDER — LEVOFLOXACIN 750 MG PO TABS
750.0000 mg | ORAL_TABLET | ORAL | Status: DC
  Administered 2016-02-22 – 2016-02-24 (×2): 750 mg via ORAL
  Filled 2016-02-22 (×3): qty 1

## 2016-02-22 MED ORDER — IPRATROPIUM-ALBUTEROL 0.5-2.5 (3) MG/3ML IN SOLN
3.0000 mL | Freq: Three times a day (TID) | RESPIRATORY_TRACT | Status: DC
Start: 2016-02-23 — End: 2016-02-24
  Administered 2016-02-23 – 2016-02-24 (×4): 3 mL via RESPIRATORY_TRACT
  Filled 2016-02-22 (×5): qty 3

## 2016-02-22 NOTE — Progress Notes (Signed)
PT Cancellation Note  Patient Details Name: Gina Hodges MRN: [id number] DOB: Jun 07, 1927   Cancelled Treatment:    Reason Eval/Treat Not Completed: Medical issues which prohibited therapy;Patient not medically ready (Pt reports she is not feeling well, too weak to participate) upon entering room, she is holding to emesis bag, non-productive wet cough.  Adjusted HOB to 30 degrees to assist with pulmonary hygeine.  Will check on patient early part of the week as able.  [name], PT [phone]  Gina Hodges 02/22/2016, 4:23 PM

## 2016-02-22 NOTE — Progress Notes (Signed)
Family Medicine Teaching Service Daily Progress Note Intern Pager: [phone]  Patient name: Gina Hodges Medical record number: [id number] Date of birth: Apr 30, 1927 Age: 82 y.o. Gender: female  Primary Care Provider: No PCP Per Patient Consultants: None Code Status: DNR & DNI  Pt Overview and Major Events to Date:  None  Assessment and Plan: Gina Hodges is a 82 y.o. female admitted for SOB . PMH is significant for dementia, paroxismal a-fib, hypothyroidism, hyperlipidemia, GERD, GAD, osteoporosis & OA.   # Healthcare associated PNA:  Continued expiratory wheeze and rhonchi but WOB improved.  Patient on 2L overnight for comfort.  No desaturations.  Norco in place but on room air (O2 off).   - Bcx (5/18):  Gram + cocci in cluster in 1 anaerobic bottle only, coagulase positive staph likely a contaminant  - Repeat BCx (5/19): NGTD - Urine legionella & Strep A never collected.  Will dc order since has been on abx for several days - Vancomycin and cefepime per pharmacy (5/18>5/21) for HCAP - Will transition to PO Levaquin 750 mg today. Administer q48 x 4 days to complete 7 day course of abx (renally dosed per pharmacy). - Duonebs Q6 and Albuterol q2 prn for wheezing/SOB - Negative MRSA by PCR - Continue Robitussin syrup q6 prn cough/ congestion  # Hypothyroidism: On levothyroxine 75 mcg QD. TSH 0.525  - On home dose   #OA: Back pain. On Acetaminophen 325 mg BID, Tramadol 50 mg Q6 PRN for pain, Naproxen 500 mg TID w/ meals - Continue Tylenol and Naproxen - Home Tramadol.  #Depression & GAD: On celexa 20 mg QD and Alprazolam 1mg  qhs w/ 0.25mg  BID prn at facility.  - Continue celexa 20 mg QD - Holding alprazolam and give ativan 1 mg at night time,  - Would consider weaning off benzodiazepines given age, will defer this to PCP. - Could consider Ativan PRN during the day.  - Will order repeat EKG in the setting of Levaquin initiation and concurrent use of Celexa.  #Hyperlipidemia -  Atorvastatin 10 mg qhs  #Constipation: resolved.  #GERD: On Omeprazole 20 mg QD. - Protonix while hospitalized  #Dementia: Likely mild. Alert and oriented, except for year. Needs assistance with ADLs. Nephew Biomedical scientist(Bill Effie ShyColeman) has [employer] Power of Attorney. - PT c/s  - Continue Aricept  #Paroxysmal atrial fibrillation: In NSR on cardiac monitor in ED. EKG NSR with left axis deviation. CHADSVASC 3.  HASBLED 4.1%.  - on ASA but not on any other meds for this  #?HTN: On Lisinopril 1.25mg  qd at facility (BP 156/92) - Consider discontinuing entirely as patient is at risk for orthostatic hypotension given age.  FEN/GI: Heart healthy Diet/ Boost ordered TID between meals, PPI Prophylaxis: Lovenox   Disposition: to SNF when able  Subjective:  Patient notes that breathing is better today.  She reports that she had 1 episode of vomitus yesterday.  She denies feeling nauseated.  She thinks that she just ate too much too quickly.  She reports that she is eating more slowly this am.  Denies abdominal pain.  Continues to have wheezes.  Objective: Temp:  [97.9 F (36.6 C)-98.5 F (36.9 C)] 98.2 F (36.8 C) (05/21 0604) Pulse Rate:  [77-105] 85 (05/21 0604) Resp:  [18-22] 20 (05/21 0604) BP: (123-146)/(56-62) 146/62 mmHg (05/21 0604) SpO2:  [94 %-98 %] 97 % (05/21 0714)  Physical Exam: General: Patient lying in bed having breakfast. In no apparent distress.  Cardiovascular: RRR. No murmurs, rubs, or gallops  Respiratory: Global expiratory  wheeze with intermittent expiratory rhonchi, normal WOB on room air  Abdomen: BS+, NT/ND, soft Ext: no edema, +2 DP   Laboratory:  Recent Labs Lab 02/19/16 1250 02/20/16 0259 02/21/16 0725  WBC 3.0* 3.9* 7.6  HGB 12.1 10.6* 11.5*  HCT 38.2 33.6* 35.9*  PLT 223 208 256    Recent Labs Lab 02/19/16 1250 02/20/16 0259 02/21/16 0725  NA 133* 135 136  K 3.7 4.2 3.4*  CL 100* 105 100*  CO2 21* 22 23  BUN 8 9 9   CREATININE 0.83 0.63 0.64   CALCIUM 9.0 8.2* 9.0  PROT 6.3*  --   --   BILITOT 0.5  --   --   ALKPHOS 68  --   --   ALT 15  --   --   AST 26  --   --   GLUCOSE 136* 144* 101*   Results for orders placed or performed during the hospital encounter of 02/19/16  Blood culture (routine x 2)     Status: None (Preliminary result)   Collection Time: 02/19/16  1:35 PM  Result Value Ref Range Status   Specimen Description BLOOD LEFT HAND  Final   Special Requests BOTTLES DRAWN AEROBIC ONLY 5CC  Final   Culture NO GROWTH 2 DAYS  Final   Report Status PENDING  Incomplete  Blood culture (routine x 2)     Status: Abnormal   Collection Time: 02/19/16  1:42 PM  Result Value Ref Range Status   Specimen Description BLOOD LEFT WRIST  Final   Special Requests BOTTLES DRAWN AEROBIC AND ANAEROBIC 5CC  Final   Culture  Setup Time   Final    GRAM POSITIVE COCCI IN CLUSTERS ANAEROBIC BOTTLE ONLY Organism ID to follow CRITICAL RESULT CALLED TO, READ BACK BY AND VERIFIED WITH: E. MARTIN, PHARM D AT 0921 ON [id number] BY Lucienne Capers    Culture (A)  Final    STAPHYLOCOCCUS SPECIES (COAGULASE NEGATIVE) THE SIGNIFICANCE OF ISOLATING THIS ORGANISM FROM A SINGLE SET OF BLOOD CULTURES WHEN MULTIPLE SETS ARE DRAWN IS UNCERTAIN. PLEASE NOTIFY THE MICROBIOLOGY DEPARTMENT WITHIN ONE WEEK IF SPECIATION AND SENSITIVITIES ARE REQUIRED.    Report Status 02/21/2016 FINAL  Final  Blood Culture ID Panel (Reflexed)     Status: Abnormal   Collection Time: 02/19/16  1:42 PM  Result Value Ref Range Status   Enterococcus species NOT DETECTED NOT DETECTED Final   Vancomycin resistance NOT DETECTED NOT DETECTED Final   Listeria monocytogenes NOT DETECTED NOT DETECTED Final   Staphylococcus species DETECTED (A) NOT DETECTED Final    Comment: CRITICAL RESULT CALLED TO, READ BACK BY AND VERIFIED WITH: EDaphine Deutscher, PHARM D AT 0921 ON [id number] BY S. YARBROUGH    Staphylococcus aureus NOT DETECTED NOT DETECTED Final   Methicillin resistance NOT DETECTED NOT  DETECTED Final   Streptococcus species NOT DETECTED NOT DETECTED Final   Streptococcus agalactiae NOT DETECTED NOT DETECTED Final   Streptococcus pneumoniae NOT DETECTED NOT DETECTED Final   Streptococcus pyogenes NOT DETECTED NOT DETECTED Final   Acinetobacter baumannii NOT DETECTED NOT DETECTED Final   Enterobacteriaceae species NOT DETECTED NOT DETECTED Final   Enterobacter cloacae complex NOT DETECTED NOT DETECTED Final   Escherichia coli NOT DETECTED NOT DETECTED Final   Klebsiella oxytoca NOT DETECTED NOT DETECTED Final   Klebsiella pneumoniae NOT DETECTED NOT DETECTED Final   Proteus species NOT DETECTED NOT DETECTED Final   Serratia marcescens NOT DETECTED NOT DETECTED Final   Carbapenem resistance NOT DETECTED  NOT DETECTED Final   Haemophilus influenzae NOT DETECTED NOT DETECTED Final   Neisseria meningitidis NOT DETECTED NOT DETECTED Final   Pseudomonas aeruginosa NOT DETECTED NOT DETECTED Final   Candida albicans NOT DETECTED NOT DETECTED Final   Candida glabrata NOT DETECTED NOT DETECTED Final   Candida krusei NOT DETECTED NOT DETECTED Final   Candida parapsilosis NOT DETECTED NOT DETECTED Final   Candida tropicalis NOT DETECTED NOT DETECTED Final  MRSA PCR Screening     Status: None   Collection Time: 02/19/16  4:18 PM  Result Value Ref Range Status   MRSA by PCR NEGATIVE NEGATIVE Final    Comment:        The GeneXpert MRSA Assay (FDA approved for NASAL specimens only), is one component of a comprehensive MRSA colonization surveillance program. It is not intended to diagnose MRSA infection nor to guide or monitor treatment for MRSA infections.   Culture, blood (Routine X 2) w Reflex to ID Panel     Status: None (Preliminary result)   Collection Time: 02/20/16  9:02 PM  Result Value Ref Range Status   Specimen Description BLOOD RIGHT ANTECUBITAL  Final   Special Requests BOTTLES DRAWN AEROBIC ONLY 5CC  Final   Culture NO GROWTH < 12 HOURS  Final   Report Status  PENDING  Incomplete  Culture, blood (Routine X 2) w Reflex to ID Panel     Status: None (Preliminary result)   Collection Time: 02/20/16  9:11 PM  Result Value Ref Range Status   Specimen Description BLOOD LEFT ANTECUBITAL  Final   Special Requests IN PEDIATRIC BOTTLE 4CC  Final   Culture NO GROWTH < 12 HOURS  Final   Report Status PENDING  Incomplete    Imaging/Diagnostic Tests: None   Raliegh Ip, DO 02/22/2016, 8:41 AM PGY-2, [facility]

## 2016-02-23 DIAGNOSIS — E785 Hyperlipidemia, unspecified: Secondary | ICD-10-CM

## 2016-02-23 LAB — BASIC METABOLIC PANEL
Anion gap: 7 (ref 5–15)
BUN: 8 mg/dL (ref 6–20)
CO2: 23 mmol/L (ref 22–32)
CREATININE: 0.64 mg/dL (ref 0.44–1.00)
Calcium: 8.6 mg/dL — ABNORMAL LOW (ref 8.9–10.3)
Chloride: 100 mmol/L — ABNORMAL LOW (ref 101–111)
GFR calc Af Amer: >60 mL/min (ref >60)
Glucose, Bld: 94 mg/dL (ref 65–99)
Potassium: 3.8 mmol/L (ref 3.5–5.1)
SODIUM: 130 mmol/L — AB (ref 135–145)

## 2016-02-23 LAB — CBC
HCT: 31.7 % — ABNORMAL LOW (ref 36.0–46.0)
Hemoglobin: 10.4 g/dL — ABNORMAL LOW (ref 12.0–15.0)
MCH: 29.1 pg (ref 26.0–34.0)
MCHC: 32.8 g/dL (ref 30.0–36.0)
MCV: 88.8 fL (ref 78.0–100.0)
PLATELETS: 290 10*3/uL (ref 150–400)
RBC: 3.57 MIL/uL — ABNORMAL LOW (ref 3.87–5.11)
RDW: 14.4 % (ref 11.5–15.5)
WBC: 8.4 10*3/uL (ref 4.0–10.5)

## 2016-02-23 LAB — TROPONIN I: Troponin I: <0.03 ng/mL (ref <0.031)

## 2016-02-23 MED ORDER — PREDNISONE 50 MG PO TABS: 50.0000 mg | ORAL_TABLET | Freq: Every day | ORAL | Status: DC

## 2016-02-23 MED ORDER — GI COCKTAIL ~~LOC~~
30.0000 mL | Freq: Once | ORAL | Status: AC
  Administered 2016-02-23: 30 mL via ORAL
  Filled 2016-02-23: qty 30

## 2016-02-23 MED ORDER — TRAMADOL HCL 50 MG PO TABS
50.0000 mg | ORAL_TABLET | Freq: Two times a day (BID) | ORAL | Status: DC | PRN
  Administered 2016-02-23: 50 mg via ORAL
  Filled 2016-02-23: qty 1

## 2016-02-23 MED ORDER — LORAZEPAM 0.5 MG PO TABS
0.2500 mg | ORAL_TABLET | Freq: Two times a day (BID) | ORAL | Status: DC
  Administered 2016-02-23 – 2016-02-24 (×2): 0.25 mg via ORAL
  Filled 2016-02-23 (×2): qty 1

## 2016-02-23 MED ORDER — PREDNISONE 50 MG PO TABS
50.0000 mg | ORAL_TABLET | Freq: Every day | ORAL | Status: DC
  Administered 2016-02-23 – 2016-02-24 (×2): 50 mg via ORAL
  Filled 2016-02-23 (×2): qty 1

## 2016-02-23 NOTE — Evaluation (Signed)
Physical Therapy Evaluation Patient Details Name: Gina Hodges MRN: [id number] DOB: 02/07/1927 Today's Date: 02/23/2016   History of Present Illness  82 Y/O F with PMX of Generalized anxiety disorder, HLD, Dementia, PAF, Hypothyroidism prsented with 1 wk hx of cough and SOB.   Clinical Impression  Pt admitted with above diagnosis. Pt currently with functional limitations due to the deficits listed below (see PT Problem List).  Pt will benefit from skilled PT to increase their independence and safety with mobility to allow discharge to the venue listed below.  Pt presents with weakness and required max encouragement to get OOB and transfer to recliner.  At this time, feel pt needs more A than ALF can provide and recommend short term SNF.  Unsure if her ALF has a SNF portion or not, but feel she would benefit from short term rehab prior to returning to her ALF apartment.     Follow Up Recommendations SNF;Supervision/Assistance - 24 hour;Supervision for mobility/OOB    Equipment Recommendations  None recommended by PT    Recommendations for Other Services       Precautions / Restrictions Precautions Precautions: Fall Restrictions Weight Bearing Restrictions: No      Mobility  Bed Mobility Overal bed mobility: Needs Assistance Bed Mobility: Supine to Sit     Supine to sit: Min assist;HOB elevated     General bed mobility comments: HOB elevated and with rail.  Used PT's hand for leverage and did need A of bed pads to get hips fully turned.  Transfers Overall transfer level: Needs assistance Equipment used: Rolling walker (2 wheeled) Transfers: Sit to/from [employer]GI CorporationStand;Stand Pivot Transfers Sit to Stand: Min assist Stand pivot transfers: Mod assist       General transfer comment: Pt stood to RW and then taking hands off of RW to reach for chair.  Pt able to respond to instructions and placed 1 hand back on RW, but reaching with other and needed A to get hips fully turned.  Noted pt  with small loose stool, seemed to occur with strain of standing.  Ambulation/Gait             General Gait Details: NT  Stairs            Wheelchair Mobility    Modified Rankin (Stroke Patients Only)       Balance Overall balance assessment: Needs assistance Sitting-balance support: Feet supported Sitting balance-Leahy Scale: Fair Sitting balance - Comments: no A, but required close min/guard due to unsteadiness   Standing balance support: Single extremity supported Standing balance-Leahy Scale: Poor                               Pertinent Vitals/Pain Pain Assessment: No/denies pain    Home Living Family/patient expects to be discharged to:: Assisted living               Home Equipment: Walker - 2 wheels      Prior Function Level of Independence: Needs assistance   Gait / Transfers Assistance Needed: Pt stated someone was with her when walking more recently, but she transferred by herself I'ly.  ADL's / Homemaking Assistance Needed: ALF staff A with batheing and dressing        Hand Dominance        Extremity/Trunk Assessment   Upper Extremity Assessment: Generalized weakness           Lower Extremity Assessment: Generalized weakness  Cervical / Trunk Assessment: Normal  Communication   Communication: HOH (hearing aide)  Cognition Arousal/Alertness: Awake/alert Behavior During Therapy: WFL for tasks assessed/performed Overall Cognitive Status: No family/caregiver present to determine baseline cognitive functioning       Memory: Decreased short-term memory              General Comments General comments (skin integrity, edema, etc.): Pt with decreased memory during session.  Would agree to get up OOB to recliner, then immediatley say she was too weak.    Exercises        Assessment/Plan    PT Assessment Patient needs continued PT services  PT Diagnosis Generalized weakness;Difficulty walking   PT  Problem List Decreased strength;Decreased activity tolerance;Decreased balance;Decreased mobility;Decreased knowledge of use of DME  PT Treatment Interventions DME instruction;Gait training;Functional mobility training;Therapeutic activities;Therapeutic exercise;Balance training   PT Goals (Current goals can be found in the Care Plan section) Acute Rehab PT Goals Patient Stated Goal: Pt unable to state PT Goal Formulation: Patient unable to participate in goal setting Time For Goal Achievement: 03/01/16 Potential to Achieve Goals: Fair    Frequency Min 3X/week   Barriers to discharge        Co-evaluation               End of Session Equipment Utilized During Treatment: Gait belt Activity Tolerance: Patient limited by fatigue Patient left: in chair;with call bell/phone within reach;with chair alarm set Nurse Communication: Mobility status (Nurse Tech)         Time: 2952-8413 PT Time Calculation (min) (ACUTE ONLY): 19 min   Charges:   PT Evaluation $PT Eval Moderate Complexity: 1 Procedure     PT G Codes:        Gina Hodges 02/23/2016, 10:01 AM

## 2016-02-23 NOTE — NC FL2 (Signed)
[place] MEDICAID FL2 LEVEL OF CARE SCREENING TOOL     IDENTIFICATION  Patient Name: Gina Hodges Birthdate: 12-02-1926 Sex: female Admission Date (Current Location): 02/19/2016  [facility] [place]   Facility and Address:  [facility], [address], [place], [place] [address]      Provider Number: [id number]  Attending Physician Name and Address:  Uvaldo RisingKyle J Fletke, MD  Relative Name and Phone Number:  Chrissie NoaWilliam, [phone]    Current Level of Care: Hospital Recommended Level of Care: Assisted Living Facility Prior Approval Number:    Date Approved/Denied:   PASRR Number:    Discharge Plan: Other (Comment) (ALF)    Current Diagnoses: Patient Active Problem List   Diagnosis Date Noted  . Lactic acidosis   . Healthcare-associated pneumonia 02/19/2016  . SIRS (systemic inflammatory response syndrome) (HCC)   . Ingrown nail 11/12/2013  . Compression fracture of spine (HCC) 11/07/2013  . UTI (urinary tract infection) 11/07/2013  . Sternal fracture 11/07/2013  . Hypothyroidism 11/07/2013  . Hyperlipidemia   . Dementia with behavioral problem   . GERD (gastroesophageal reflux disease)   . PAF (paroxysmal atrial fibrillation) (HCC)   . Generalized anxiety disorder 04/05/2013    Orientation RESPIRATION BLADDER Height & Weight     Self, Time, Place  Normal Incontinent Weight: 161 lb 6 oz (73.2 kg) Height:  5\' 1"  (154.9 cm)  BEHAVIORAL SYMPTOMS/MOOD NEUROLOGICAL BOWEL NUTRITION STATUS      Incontinent Diet  AMBULATORY STATUS COMMUNICATION OF NEEDS Skin   Extensive Assist Verbally Normal                       Personal Care Assistance Level of Assistance  Bathing, Dressing Bathing Assistance: Limited assistance Feeding assistance: Independent Dressing Assistance: Limited assistance     Functional Limitations Info  Hearing   Hearing Info: Impaired      SPECIAL CARE FACTORS FREQUENCY  PT (By licensed PT), OT (By  licensed OT)     PT Frequency: 3/wk with home health for generalized weakness OT Frequency: 3/wk with home health for generalized weakness            Contractures      Additional Factors Info  Code Status, Allergies, Psychotropic Code Status Info: DNR Allergies Info: Penicillins, Sulfa Antibiotics, Codeine Psychotropic Info: ativan, celexa         Current Medications (02/23/2016):  This is the current hospital active medication list Current Facility-Administered Medications  Medication Dose Route Frequency Provider Last Rate Last Dose  . acetaminophen (TYLENOL) tablet 650 mg  650 mg Oral BID Raliegh IpAshly M Gottschalk, DO   650 mg at 02/22/16 2102  . albuterol (PROVENTIL) (2.5 MG/3ML) 0.083% nebulizer solution 2.5 mg  2.5 mg Nebulization Q2H PRN Raliegh IpAshly M Gottschalk, DO   2.5 mg at 02/23/16 0459  . aspirin EC tablet 81 mg  81 mg Oral Daily Raliegh Ipshly M Gottschalk, DO   81 mg at 02/23/16 [id number]  . atorvastatin (LIPITOR) tablet 10 mg  10 mg Oral QHS Ashly M Gottschalk, DO   10 mg at 02/22/16 2102  . benzonatate (TESSALON) capsule 100 mg  100 mg Oral TID PRN Beaulah Dinninghristina M Gambino, MD   100 mg at 02/22/16 1604  . citalopram (CELEXA) tablet 20 mg  20 mg Oral Daily Raliegh IpAshly M Gottschalk, DO   20 mg at 02/23/16 [id number]  . docusate sodium (COLACE) capsule 100 mg  100 mg Oral Daily PRN Raliegh IpAshly M Gottschalk,  DO      . donepezil (ARICEPT) tablet 10 mg  10 mg Oral QHS Ashly M Gottschalk, DO   10 mg at 02/22/16 2102  . enoxaparin (LOVENOX) injection 40 mg  40 mg Subcutaneous Q24H Ashly M Gottschalk, DO   40 mg at 02/22/16 1601  . feeding supplement (BOOST / RESOURCE BREEZE) liquid 1 Container  1 Container Oral TID BM Raliegh Ip, DO   1 Container at 02/23/16 [phone]  . guaifenesin (ROBITUSSIN) 100 MG/5ML syrup 200 mg  200 mg Oral Q6H PRN Raliegh Ip, DO   200 mg at 02/22/16 2129  . ipratropium-albuterol (DUONEB) 0.5-2.5 (3) MG/3ML nebulizer solution 3 mL  3 mL Nebulization TID Uvaldo Rising, MD   3 mL at  02/23/16 0814  . levofloxacin (LEVAQUIN) tablet 750 mg  750 mg Oral Q48H Uvaldo Rising, MD   750 mg at 02/22/16 1227  . levothyroxine (SYNTHROID, LEVOTHROID) tablet 75 mcg  75 mcg Oral QAC breakfast Raliegh Ip, DO   75 mcg at 02/23/16 1191  . LORazepam (ATIVAN) tablet 0.25 mg  0.25 mg Oral BID Yolande Jolly, MD      . multivitamin with minerals tablet 1 tablet  1 tablet Oral Daily Raliegh Ip, DO   1 tablet at 02/23/16 4782  . naproxen (NAPROSYN) tablet 500 mg  500 mg Oral TID WC Ashly M Gottschalk, DO   500 mg at 02/23/16 1100  . ondansetron (ZOFRAN) tablet 4 mg  4 mg Oral Q8H PRN Asiyah Mayra Reel, MD   4 mg at 02/22/16 2102  . pantoprazole (PROTONIX) EC tablet 20 mg  20 mg Oral Daily Raliegh Ip, DO   20 mg at 02/23/16 9562  . polyethylene glycol (MIRALAX / GLYCOLAX) packet 17 g  17 g Oral Daily Raliegh Ip, DO   17 g at 02/23/16 1308  . predniSONE (DELTASONE) tablet 50 mg  50 mg Oral Q breakfast Yolande Jolly, MD   50 mg at 02/23/16 1058  . sodium chloride (OCEAN) 0.65 % nasal spray 1 spray  1 spray Each Nare PRN Campbell Stall, MD   1 spray at 02/21/16 0801  . traMADol (ULTRAM) tablet 50 mg  50 mg Oral Q12H PRN Raliegh Ip, DO         Discharge Medications: Please see discharge summary for a list of discharge medications.  Relevant Imaging Results:  Relevant Lab Results:   Additional Information SSN: [id number] [place], [place]

## 2016-02-23 NOTE — Evaluation (Signed)
Clinical/Bedside Swallow Evaluation Patient Details  Name: Gina Hodges MRN: [id number] Date of Birth: 27-Dec-1926  Today's Date: 02/23/2016 Time: SLP Start Time (ACUTE ONLY): 1439 SLP Stop Time (ACUTE ONLY): 1501 SLP Time Calculation (min) (ACUTE ONLY): 22 min  Past Medical History:  Past Medical History  Diagnosis Date  . Thyroid disease   . Vertigo   . Dementia   . Osteoporosis   . Hyperlipidemia   . Dementia with behavioral problem   . GERD (gastroesophageal reflux disease)   . PAF (paroxysmal atrial fibrillation) (HCC)   . Anxiety   . Ulcerative esophagitis    Past Surgical History:  Past Surgical History  Procedure Laterality Date  . Appendectomy    . Cholecystectomy    . Abdominal hysterectomy     HPI:  Gina NoraLora Fare is a 82 y.o. female presenting with SOB . PMH is significant for dementia, paroxismal a-fib, hypothyroidism, hyperlipidemia, GERD, GAD, osteoporosis & OA. Current chest x-ray showing Suboptimal inspiration accounts for atelectasis in the lower lobes, no acute cardiopulmonary disease otherwise. and moderately large hiatal hernia with an air-fluid level in the intrathoracic stomach.   No previous ST work noted.  Patient current diagnosed with HCAP.     Assessment / Plan / Recommendation Clinical Impression  Clinical swallowing evaluation was completed.  Oral mechanism exam revealed lingual weakness.  The patient presented with oropharyngeal dysphagia characterized by delayed oral transit and delayed swallow trigger.  Inconsistent cough was noted given thin liquids and dry solids.  Patient currently diagnosed with health care acquired PNA.  Recommend a dysphagia 3 diet with thin liquids pending MBS tomorrow to rule out aspiration.      Aspiration Risk  Mild aspiration risk    Diet Recommendation   Dysphagia 3 with thin liquids pending MBS results tomorrow.    Medication Administration: Crushed with puree    Other  Recommendations Oral Care Recommendations: Oral  care BID   Follow up Recommendations   (to be determined)      Swallow Study   General Date of Onset: 02/19/16 HPI: Gina NoraLora Kranz is a 82 y.o. female presenting with SOB . PMH is significant for dementia, paroxismal a-fib, hypothyroidism, hyperlipidemia, GERD, GAD, osteoporosis & OA. Current chest x-ray showing Suboptimal inspiration accounts for atelectasis in the lower lobes, no acute cardiopulmonary disease otherwise. and moderately large hiatal hernia with an air-fluid level in the intrathoracic stomach.   No previous ST work noted.  Patient current diagnosed with HCAP.   Type of Study: Bedside Swallow Evaluation Previous Swallow Assessment: None noted.   Diet Prior to this Study: Regular;Thin liquids Temperature Spikes Noted: No Respiratory Status: Room air History of Recent Intubation: No Behavior/Cognition: Alert;Cooperative;Pleasant mood;Requires cueing Oral Cavity Assessment: Within Functional Limits (Lingual redness and pain per the patient.  ) Oral Care Completed by SLP: No Oral Cavity - Dentition: Dentures, bottom;Dentures, top (Poorly fitting.  ) Vision: Functional for self-feeding Self-Feeding Abilities: Able to feed self Patient Positioning: Upright in chair Baseline Vocal Quality: Normal Volitional Cough: Strong Volitional Swallow: Able to elicit    Oral/Motor/Sensory Function Overall Oral Motor/Sensory Function: Mild impairment Facial ROM: Within Functional Limits Facial Symmetry: Within Functional Limits Facial Strength: Within Functional Limits Lingual ROM: Within Functional Limits Lingual Symmetry: Within Functional Limits Lingual Strength: Reduced Mandible: Within Functional Limits   Ice Chips Ice chips: Not tested   Thin Liquid Thin Liquid: Impaired Presentation: Cup;Spoon;Straw;Self Fed Pharyngeal  Phase Impairments: Suspected delayed Swallow;Cough - Delayed (inconsistent)    Nectar Thick Nectar Thick Liquid:  Not tested   Honey Thick Honey Thick Liquid:  Not tested   Puree Puree: Impaired Presentation: Spoon Oral Phase Impairments: Impaired mastication Oral Phase Functional Implications: Prolonged oral transit Pharyngeal Phase Impairments: Suspected delayed Swallow   Solid   GO   Solid: Impaired Presentation: Self Fed Oral Phase Impairments: Impaired mastication Oral Phase Functional Implications: Prolonged oral transit Pharyngeal Phase Impairments: Suspected delayed Swallow;Cough - Delayed (Inconsistent)    Functional Assessment Tool Used: ASHA NOMS and clinical judgment.   Functional Limitations: Swallowing Swallow Current Status [phone]): At least 20 percent but less than 40 percent impaired, limited or restricted Swallow Goal Status [phone]): At least 20 percent but less than 40 percent impaired, limited or restricted   Dimas Aguas, MA, CCC-SLP Acute Rehab SLP [phone] Fleet Contras 02/23/2016,3:08 PM

## 2016-02-23 NOTE — Progress Notes (Signed)
[facility]- confirmed with ALF that they can accommodate pt PT needs at ALF level of care  Gina LotJenna Hodges, [facility] CenterCSWA Clinical Social Worker [phone]

## 2016-02-23 NOTE — Progress Notes (Signed)
Family Medicine Teaching Service Daily Progress Note Intern Pager: [phone]  Patient name: Gina Hodges Medical record number: [id number] Date of birth: 1926-11-25 Age: 82 y.o. Gender: female  Primary Care Provider: No PCP Per Patient Consultants: None Code Status: DNR & DNI  Pt Overview and Major Events to Date:  None  Assessment and Plan: Gina Hodges is a 82 y.o. female admitted for SOB . PMH is significant for dementia, paroxismal a-fib, hypothyroidism, hyperlipidemia, GERD, GAD, osteoporosis & OA.   # SOB: Met SIRS criteria on admission.  Some improvement but less than expected given 4 days of Abx. No signs of PNA on recent film. Continued expiratory wheeze and rhonchi but WOB improved. SpO2 at 93 on room air. DDx: Aspiration vs. Asthma vs. PNA  - CXR last night: suboptimal inspiration accounting for atelectasis in the lower lobes. No acute cardiopulmonary disease otherwise. Moderately large hiatal hernia with an air-fluid level in the intrathoracic stomach. - Patient states resp tx helps a lot for couple hours. Start prednisone.  - Duonebs Q6 and Albuterol q2 prn for wheezing/SOB - Questionable choking/trouble swallowing- SLP Eval.  - On PO Levaquin 750 mg. Administer q48 x 4 days to complete 7 day course of abx (renally dosed per pharmacy). - Continue Robitussin syrup q6 prn cough/ congestion - Bcx (5/18):  Gram + cocci in cluster in 1 anaerobic bottle only, coagulase positive staph likely a contaminant  - Repeat BCx (5/19): NGTD - Negative MRSA by PCR  #Weakness: Likely secondary to decreased PO - Swallow study - Strict Is/Os - Consider parenteral nutrition - Orthostatics when able  # Hypothyroidism: On levothyroxine 75 mcg QD. TSH 0.525  - On home dose   #OA: Back pain. On Acetaminophen 325 mg BID, Tramadol 50 mg Q6 PRN for pain, Naproxen 500 mg TID w/ meals - Continue Tylenol and Naproxen - Home Tramadol.  #Depression & GAD: On celexa 20 mg QD and Alprazolam 84m qhs  w/ 0.2717mBID prn at facility.  - Continue celexa 20 mg QD - Restart Alprazolam regimen @ facility:  Alprazolam 17m43mhs w/ 0.37m33mD prn.  - EKG in the setting of Levaquin initiation and concurrent use of Celexa: unchanged from prior  #Hyperlipidemia - Atorvastatin 10 mg qhs  #Constipation: resolved.  #GERD: On Omeprazole 20 mg QD. - Protonix while hospitalized  #Dementia: Likely mild. Needs assistance with ADLs. Nephew (BilData processing managereChana Bodes HealPress photographerPT c/s, unable to examine 2/2 to weakness. Needs PT c/s when able.  - Continue Aricept  #Paroxysmal atrial fibrillation: In NSR on cardiac monitor in ED. EKG NSR with left axis deviation. CHADSVASC 3.  HASBLED 4.1%.  - on ASA but not on any other meds for this  #?HTN: normo-hypotensive during admission. BP 124/57 -No dx and no signs of HTN during hospital stay with low diastolics. D/C'ed lisinopril 1.25 mg. - Orthostatics when able  FEN/GI: Heart healthy Diet/ Boost ordered TID between meals, PPI Prophylaxis: Lovenox   Disposition: to SNF when able  Subjective:  Patient feels that breathing and cough is better today but endorses difficulty breathing/difficulty taking a breath. Specifically denies pleuritic pain. She denies CP, epigastric pain or feeling nauseated. Continues to have wheezes. Patient states she cannot relax. She endorses feeling weak, eating less than normal, and last BM was of Friday following Miralax.   Objective: Temp:  [98.1 F (36.7 C)-99.8 F (37.7 C)] 98.1 F (36.7 C) (05/22 0604) Pulse Rate:  [89-119] 91 (05/22 0604) Resp:  [18-22] 22 (05/22  0604) BP: (124-135)/(57-99) 124/57 mmHg (05/22 0604) SpO2:  [95 %-96 %] 95 % (05/22 0604)  Physical Exam: General: Patient lying in bed having breakfast.   Cardiovascular: RRR. No murmurs, rubs, or gallops  Respiratory: Global expiratory wheeze with intermittent expiratory rhonchi, normal WOB on room air  Abdomen: BS+, NT/ND, soft Ext: no  edema, +2 DP   Laboratory:  Recent Labs Lab 02/21/16 0725 02/22/16 0908 02/23/16 0515  WBC 7.6 8.1 8.4  HGB 11.5* 10.9* 10.4*  HCT 35.9* 33.0* 31.7*  PLT 256 132* 290    Recent Labs Lab 02/19/16 1250  02/21/16 0725 02/22/16 0908 02/23/16 0515  NA 133*  < > 136 130* 130*  K 3.7  < > 3.4* 3.6 3.8  CL 100*  < > 100* 102 100*  CO2 21*  < > 23 21* 23  BUN 8  < > '9 8 8  ' CREATININE 0.83  < > 0.64 0.58 0.64  CALCIUM 9.0  < > 9.0 8.6* 8.6*  PROT 6.3*  --   --   --   --   BILITOT 0.5  --   --   --   --   ALKPHOS 68  --   --   --   --   ALT 15  --   --   --   --   AST 26  --   --   --   --   GLUCOSE 136*  < > 101* 121* 94  < > = values in this interval not displayed. Results for orders placed or performed during the hospital encounter of 02/19/16  Blood culture (routine x 2)     Status: None (Preliminary result)   Collection Time: 02/19/16  1:35 PM  Result Value Ref Range Status   Specimen Description BLOOD LEFT HAND  Final   Special Requests BOTTLES DRAWN AEROBIC ONLY 5CC  Final   Culture NO GROWTH 3 DAYS  Final   Report Status PENDING  Incomplete  Blood culture (routine x 2)     Status: Abnormal   Collection Time: 02/19/16  1:42 PM  Result Value Ref Range Status   Specimen Description BLOOD LEFT WRIST  Final   Special Requests BOTTLES DRAWN AEROBIC AND ANAEROBIC 5CC  Final   Culture  Setup Time   Final    GRAM POSITIVE COCCI IN CLUSTERS ANAEROBIC BOTTLE ONLY Organism ID to follow CRITICAL RESULT CALLED TO, READ BACK BY AND VERIFIED WITH: E. MARTIN, PHARM D AT 0921 ON [id number] BY Rhea Bleacher    Culture (A)  Final    STAPHYLOCOCCUS SPECIES (COAGULASE NEGATIVE) THE SIGNIFICANCE OF ISOLATING THIS ORGANISM FROM A SINGLE SET OF BLOOD CULTURES WHEN MULTIPLE SETS ARE DRAWN IS UNCERTAIN. PLEASE NOTIFY THE MICROBIOLOGY DEPARTMENT WITHIN ONE WEEK IF SPECIATION AND SENSITIVITIES ARE REQUIRED.    Report Status 02/22/2016 FINAL  Final  Blood Culture ID Panel (Reflexed)     Status:  Abnormal   Collection Time: 02/19/16  1:42 PM  Result Value Ref Range Status   Enterococcus species NOT DETECTED NOT DETECTED Final   Vancomycin resistance NOT DETECTED NOT DETECTED Final   Listeria monocytogenes NOT DETECTED NOT DETECTED Final   Staphylococcus species DETECTED (A) NOT DETECTED Final    Comment: CRITICAL RESULT CALLED TO, READ BACK BY AND VERIFIED WITH: Ferne Coe, PHARM D AT 0921 ON [id number] BY S. YARBROUGH    Staphylococcus aureus NOT DETECTED NOT DETECTED Final   Methicillin resistance NOT DETECTED NOT DETECTED Final   Streptococcus  species NOT DETECTED NOT DETECTED Final   Streptococcus agalactiae NOT DETECTED NOT DETECTED Final   Streptococcus pneumoniae NOT DETECTED NOT DETECTED Final   Streptococcus pyogenes NOT DETECTED NOT DETECTED Final   Acinetobacter baumannii NOT DETECTED NOT DETECTED Final   Enterobacteriaceae species NOT DETECTED NOT DETECTED Final   Enterobacter cloacae complex NOT DETECTED NOT DETECTED Final   Escherichia coli NOT DETECTED NOT DETECTED Final   Klebsiella oxytoca NOT DETECTED NOT DETECTED Final   Klebsiella pneumoniae NOT DETECTED NOT DETECTED Final   Proteus species NOT DETECTED NOT DETECTED Final   Serratia marcescens NOT DETECTED NOT DETECTED Final   Carbapenem resistance NOT DETECTED NOT DETECTED Final   Haemophilus influenzae NOT DETECTED NOT DETECTED Final   Neisseria meningitidis NOT DETECTED NOT DETECTED Final   Pseudomonas aeruginosa NOT DETECTED NOT DETECTED Final   Candida albicans NOT DETECTED NOT DETECTED Final   Candida glabrata NOT DETECTED NOT DETECTED Final   Candida krusei NOT DETECTED NOT DETECTED Final   Candida parapsilosis NOT DETECTED NOT DETECTED Final   Candida tropicalis NOT DETECTED NOT DETECTED Final  MRSA PCR Screening     Status: None   Collection Time: 02/19/16  4:18 PM  Result Value Ref Range Status   MRSA by PCR NEGATIVE NEGATIVE Final    Comment:        The GeneXpert MRSA Assay (FDA approved for  NASAL specimens only), is one component of a comprehensive MRSA colonization surveillance program. It is not intended to diagnose MRSA infection nor to guide or monitor treatment for MRSA infections.   Culture, blood (Routine X 2) w Reflex to ID Panel     Status: None (Preliminary result)   Collection Time: 02/20/16  9:02 PM  Result Value Ref Range Status   Specimen Description BLOOD RIGHT ANTECUBITAL  Final   Special Requests BOTTLES DRAWN AEROBIC ONLY 5CC  Final   Culture NO GROWTH 2 DAYS  Final   Report Status PENDING  Incomplete  Culture, blood (Routine X 2) w Reflex to ID Panel     Status: None (Preliminary result)   Collection Time: 02/20/16  9:11 PM  Result Value Ref Range Status   Specimen Description BLOOD LEFT ANTECUBITAL  Final   Special Requests IN PEDIATRIC BOTTLE 4CC  Final   Culture NO GROWTH 2 DAYS  Final   Report Status PENDING  Incomplete    Imaging/Diagnostic Tests: Dg Chest 2 View  02/22/2016  CLINICAL DATA:  Followup bibasilar atelectasis versus pneumonia. EXAM: CHEST  2 VIEW COMPARISON:  02/19/2016. FINDINGS: AP semi-erect and lateral images were obtained. Cardiac silhouette normal in size for AP technique. Thoracic aorta tortuous and atherosclerotic. Moderately large hiatal hernia with an air-fluid level in the intrathoracic stomach. Hilar and mediastinal contours otherwise unremarkable. Suboptimal inspiration with atelectasis in the lower lobes. Lungs otherwise clear. Pulmonary vascularity normal. No visible pleural effusions. Prior augmentation of what I believe are T8 and T12. IMPRESSION: 1. Suboptimal inspiration accounts for atelectasis in the lower lobes. No acute cardiopulmonary disease otherwise. 2. Moderately large hiatal hernia with an air-fluid level in the intrathoracic stomach. Electronically Signed   By: Evangeline Dakin M.D.   On: 02/22/2016 18:51     Hershal Coria, Med Student 02/23/2016, 8:13 AM MS4, [facility]  I agree with the  above evaluation, assessment, and plan. Any correctional changes can be noted in [facility].   Aquilla Hacker, MD Family Medicine Resident - PGY 2  S: She says she feels a little better this  am, but breathing is not completely better yet. There is some conflicting report from her, but she has mentioned a couple of times that she feels that food / liquids "go down the wrong pipe" at times.   ODanley Danker Vitals:   02/22/16 2007 02/23/16 0604  BP: 129/99 124/57  Pulse: 119 91  Temp: 99.8 F (37.7 C) 98.1 F (36.7 C)  Resp: 20 22  Gen: NAD, AAOx3 CV: RRR, no MGR Resp: Expiratory wheezes with expiratory rhonchi located primarily in the center of her chest. She has some crackles in the bases.  Abd: S, NT, ND, +BS Ext: WWP, 2+ distal pulses, trace edema Psych: Alert and Oriented x 3, but unable to perform serial subtraction, or name the months of the year forward or backward.  A/P: 82 y/o F initially thought to have HAP. She was septic on admission, and has now improved. Repeat x-ray revealed what is more likely a hiatal hernia as opposed to retrocardiac pneumonia. ? Remains as to the etiology of her dyspnea. She is no longer requiring oxygen at this point. WBC improved. Vitals improved.  - Continue tx course with antibiotics. 10 days total. Levaquin QOD renally dosed.  - Given prednisone this am.  - Ativan given for anxiety at home dose as this could cause some degree of dyspnea.  - Speech consult as I am concerned that she is chronically aspirating.  - Additionally, she has at least moderate dementia. Would benefit from further evaluation as an outpatient.  - Back to SNF once speech has evaluated.

## 2016-02-23 NOTE — Progress Notes (Addendum)
FPTS Interim Progress Note  S:Called to patient's room as she was having chest pain.  Per RN she believed pain to be secondary to hiatal hernia.  Patient reports that she is having epigastric/midline chest pain.  She describes the pain as burning and a 7/10.  She reports that this is how her pain associated with her hiatal hernia feels.  No nausea, vomiting.  O: BP 129/99 mmHg  Pulse 119  Temp(Src) 99.8 F (37.7 C) (Oral)  Resp 20  Ht 5\' 1"  (1.549 m)  Wt 161 lb 6 oz (73.2 kg)  BMI 30.51 kg/m2  SpO2 95%  Gen: awake, elderly woman, NAD, tech and RN at bedside Cardio: RRR Pulm: global expiratory rhonchi Abdomen: mild epigastric TTP  A/P: Durwin NoraLora Byrer is a 82 y.o. female that presented for HAP.  Though given most recent xray, I wonder if she actually is an asthma exacerbation and the retrocardiac opacity was not the hiatal hernia.  Patient with persistent wheeze/ rhonchi.  Stat EKG unchanged from previous, no ischemia - Administer Albuterol neb now. - Will discuss with attending adding Prednisone burst, as I am not entirely convinced patient has a true pna. - Stat troponin - GI cocktail - Can give additional dose of Tramadol, as patient is codeine intolerant.  Raliegh IpAshly M Roselie Cirigliano, DO 02/23/2016, 5:02 AM PGY-2, [facility] pager [phone]

## 2016-02-24 ENCOUNTER — Inpatient Hospital Stay (HOSPITAL_COMMUNITY): Payer: Medicare HMO

## 2016-02-24 LAB — CULTURE, BLOOD (ROUTINE X 2): Culture: NO GROWTH

## 2016-02-24 LAB — BASIC METABOLIC PANEL
Anion gap: 10 (ref 5–15)
BUN: 15 mg/dL (ref 6–20)
CHLORIDE: 96 mmol/L — AB (ref 101–111)
CO2: 23 mmol/L (ref 22–32)
CREATININE: 0.65 mg/dL (ref 0.44–1.00)
Calcium: 8.9 mg/dL (ref 8.9–10.3)
GFR calc Af Amer: >60 mL/min (ref >60)
GFR calc non Af Amer: >60 mL/min (ref >60)
GLUCOSE: 168 mg/dL — AB (ref 65–99)
Potassium: 3.5 mmol/L (ref 3.5–5.1)
SODIUM: 129 mmol/L — AB (ref 135–145)

## 2016-02-24 MED ORDER — PREDNISONE 50 MG PO TABS: 50.0000 mg | ORAL_TABLET | Freq: Every day | ORAL | Status: AC

## 2016-02-24 MED ORDER — BENZONATATE 100 MG PO CAPS: 100.0000 mg | ORAL_CAPSULE | Freq: Three times a day (TID) | ORAL | Status: DC | PRN

## 2016-02-24 MED ORDER — RESOURCE THICKENUP CLEAR PO POWD
ORAL | Status: DC | PRN
  Filled 2016-02-24: qty 125

## 2016-02-24 NOTE — Progress Notes (Signed)
DC and fl2 reviewed and approved by ALF- they can accept pt back today  Patient will [facility] Anticipated discharge date: 5/23 Family notified: Loa SocksNephew william and Diane Transportation by Diane Report Number: [id number] (option 1)  CSW signing off.  Merlyn LotJenna [place], LCSWA Clinical Social Worker [phone]

## 2016-02-24 NOTE — Care Management Note (Signed)
Case Management Note  Patient Details  Name: Gina Hodges MRN: [id number] Date of Birth: 25-Mar-1927  Subjective/Objective:                    Action/Plan:  DC to SNF as facilitated by CSW.  Expected Discharge Date:                  Expected Discharge Plan:  Skilled Nursing Facility (From [facility])  In-House Referral:  Clinical Social Work  Discharge planning Services     Post Acute Care Choice:    Choice offered to:     DME Arranged:    DME Agency:     HH Arranged:    HH Agency:     Status of Service:  Completed, signed off  Medicare Important Message Given:  Yes Date Medicare IM Given:    Medicare IM give by:    Date Additional Medicare IM Given:    Additional Medicare Important Message give by:     If discussed at Long Length of Stay Meetings, dates discussed:    Additional Comments:  Lawerance SabalDebbie Jeramie Scogin, RN 02/24/2016, 1:31 PM

## 2016-02-24 NOTE — NC FL2 (Signed)
[place] MEDICAID FL2 LEVEL OF CARE SCREENING TOOL     IDENTIFICATION  Patient Name: Gina Hodges Birthdate: 01-14-1927 Sex: female Admission Date (Current Location): 02/19/2016  [facility] and [place] Number:  Producer, television/film/video and Address:  [facility], [address], [place], [place] [address]      Provider Number: [id number]  Attending Physician Name and Address:  Uvaldo Rising, MD  Relative Name and Phone Number:  [name] [phone]    Current Level of Care: Hospital Recommended Level of Care: Assisted Living Facility Prior Approval Number:    Date Approved/Denied:   PASRR Number:    Discharge Plan: Other (Comment) (ALF)    Current Diagnoses: Patient Active Problem List   Diagnosis Date Noted  . Lactic acidosis   . Healthcare-associated pneumonia 02/19/2016  . SIRS (systemic inflammatory response syndrome) (HCC)   . Ingrown nail 11/12/2013  . Compression fracture of spine (HCC) 11/07/2013  . UTI (urinary tract infection) 11/07/2013  . Sternal fracture 11/07/2013  . Hypothyroidism 11/07/2013  . Hyperlipidemia   . Dementia with behavioral problem   . GERD (gastroesophageal reflux disease)   . PAF (paroxysmal atrial fibrillation) (HCC)   . Generalized anxiety disorder 04/05/2013    Orientation RESPIRATION BLADDER Height & Weight     Self, Time, Place  Normal Incontinent Weight: 161 lb 6 oz (73.2 kg) Height:   (154.9 cm)  BEHAVIORAL SYMPTOMS/MOOD NEUROLOGICAL BOWEL NUTRITION STATUS      Incontinent Diet dysphasia 3  AMBULATORY STATUS COMMUNICATION OF NEEDS Skin   Extensive Assist Verbally Normal                       Personal Care Assistance Level of Assistance  Bathing, Dressing Bathing Assistance: Limited assistance Feeding assistance: Independent Dressing Assistance: Limited assistance     Functional Limitations Info  Hearing   Hearing Info: Impaired      SPECIAL CARE FACTORS FREQUENCY  Speech therapy      PT Frequency: 3/wk with home health for generalized weakness OT Frequency: 3/wk with home health for generalized weakness     Speech Therapy Frequency: 2/wk with home health for oropharyngeal dysphagia       Contractures      Additional Factors Info  Code Status, Allergies, Psychotropic Code Status Info: DNR Allergies Info: Penicillins, Sulfa Antibiotics, Codeine Psychotropic Info: ativan, celexa         Discharge Medications: STOP taking these medications       lisinopril 2.5 MG tablet  Commonly known as: PRINIVIL,ZESTRIL      TAKE these medications       acetaminophen 325 MG tablet  Commonly known as: TYLENOL  Take 650 mg by mouth 2 (two) times daily.     albuterol 108 (90 Base) MCG/ACT inhaler  Commonly known as: PROVENTIL HFA;VENTOLIN HFA  Inhale 2 puffs into the lungs every 6 (six) hours as needed for wheezing or shortness of breath.     ALPRAZolam 0.25 MG tablet  Commonly known as: XANAX  Take 0.25-1 mg by mouth 2 (two) times daily. Pt takes 0.25mg  in the morning and  at bedtime     aspirin EC 81 MG tablet  Take 81 mg by mouth daily.     atorvastatin 10 MG tablet  Commonly known as: LIPITOR  Take 1 tablet by mouth at bedtime.     benzonatate 100 MG capsule  Commonly known as: TESSALON  Take 1 capsule (100 mg total) by  mouth 3 (three) times daily as needed for cough.     CALAZIME SKIN PROTECTANT EX  Apply 1 application topically at bedtime. To coccyx     citalopram 20 MG tablet  Commonly known as: CELEXA  Take 20 mg by mouth daily.     donepezil 10 MG tablet  Commonly known as: ARICEPT  Take 10 mg by mouth daily.     guaifenesin 100 MG/5ML syrup  Commonly known as: ROBITUSSIN  Take 200 mg by mouth 4 (four) times daily - after meals and at bedtime.     levothyroxine 75 MCG tablet  Commonly known as: SYNTHROID, LEVOTHROID  Take 1 tablet by mouth daily.      LOPERAMIDE HCL PO  Take 2 mg by mouth as needed.     loratadine 10 MG tablet  Commonly known as: CLARITIN  Take 10 mg by mouth daily.     multivitamin tablet  Take 1 tablet by mouth daily.     naproxen 500 MG tablet  Commonly known as: NAPROSYN  Take 500 mg by mouth 3 (three) times daily with meals.     omeprazole 20 MG capsule  Commonly known as: PRILOSEC  Take 20 mg by mouth daily.     predniSONE 50 MG tablet  Commonly known as: DELTASONE  Take 1 tablet (50 mg total) by mouth daily with breakfast. x3 days. START 02/25/16  Start taking on: 02/25/2016     promethazine 25 MG tablet  Commonly known as: PHENERGAN  Take 25 mg by mouth every 6 (six) hours as needed for nausea or vomiting.     sennosides-docusate sodium 8.6-50 MG tablet  Commonly known as: SENOKOT-S  Take 2 tablets by mouth 2 (two) times daily.     traMADol 50 MG tablet  Commonly known as: ULTRAM  Take 1 tablet by mouth every 6 hours as needed for pain.           Relevant Imaging Results:  Relevant Lab Results:   Additional Information SSN: [id number][id number]  [place], [place], [place]

## 2016-02-24 NOTE — Care Management Important Message (Signed)
Important Message  Patient Details  Name: Gina Hodges MRN: [id number] Date of Birth: [birth year]   Medicare Important Message Given:  Yes    Kyla BalzarineShealy, Shelie Lansing Abena 02/24/2016, 10:35 AM

## 2016-02-24 NOTE — Progress Notes (Signed)
MBSS complete. Full report located under chart review in imaging section. Suezette Lafave, MA CCC-SLP [phone]  

## 2016-02-24 NOTE — Discharge Instructions (Signed)
Please continue Prednisone burst for 3 more days.  You have received today's dose.  Start Prednisone tomorrow 5/23.  Your Lisinopril has been discontinued as your blood pressure has been controlled without medication/ you were slightly hypotensive on the medication.

## 2016-02-24 NOTE — Progress Notes (Signed)
Gina Hodges to be D/C'd to skilled nursing facility per MD order.  Discussed with the patient and all questions fully answered.  IV catheter discontinued intact. Site without signs and symptoms of complications. Dressing and pressure applied.  Prescriptions sent to patient's pharmacy  D/c education completed with patient/family including follow up instructions, medication list, d/c activities limitations if indicated, with other d/c instructions as indicated by MD - patient able to verbalize understanding, all questions fully answered.   Patient instructed to return to ED, call 911, or call MD for any changes in condition.   Patient escorted via WC, and D/C to skilled nursing via private auto.  Gina Hodges 02/24/2016 3:20 PM

## 2016-02-25 LAB — CULTURE, BLOOD (ROUTINE X 2)
CULTURE: NO GROWTH
Culture: NO GROWTH

## 2016-12-20 ENCOUNTER — Encounter (HOSPITAL_COMMUNITY): Payer: Self-pay

## 2016-12-20 ENCOUNTER — Emergency Department (HOSPITAL_COMMUNITY): Payer: Medicare HMO

## 2016-12-20 ENCOUNTER — Emergency Department (HOSPITAL_COMMUNITY)
Admission: EM | Admit: 2016-12-20 | Discharge: 2016-12-20 | Disposition: A | Discharge status: Home / Self Care | Payer: Medicare HMO | Attending: Emergency Medicine | Admitting: Emergency Medicine

## 2016-12-20 DIAGNOSIS — E039 Hypothyroidism, unspecified: Secondary | ICD-10-CM | POA: Insufficient documentation

## 2016-12-20 DIAGNOSIS — Z79899 Other long term (current) drug therapy: Secondary | ICD-10-CM | POA: Diagnosis not present

## 2016-12-20 DIAGNOSIS — W19XXXA Unspecified fall, initial encounter: Secondary | ICD-10-CM | POA: Diagnosis not present

## 2016-12-20 DIAGNOSIS — Y929 Unspecified place or not applicable: Secondary | ICD-10-CM | POA: Diagnosis not present

## 2016-12-20 DIAGNOSIS — S066X0A Traumatic subarachnoid hemorrhage without loss of consciousness, initial encounter: Secondary | ICD-10-CM | POA: Diagnosis not present

## 2016-12-20 DIAGNOSIS — Y939 Activity, unspecified: Secondary | ICD-10-CM | POA: Diagnosis not present

## 2016-12-20 DIAGNOSIS — I609 Nontraumatic subarachnoid hemorrhage, unspecified: Secondary | ICD-10-CM

## 2016-12-20 DIAGNOSIS — Y999 Unspecified external cause status: Secondary | ICD-10-CM | POA: Insufficient documentation

## 2016-12-20 DIAGNOSIS — S0990XA Unspecified injury of head, initial encounter: Secondary | ICD-10-CM | POA: Diagnosis present

## 2016-12-20 HISTORY — DX: Anemia, unspecified: D64.9

## 2016-12-20 HISTORY — DX: Cerebral infarction, unspecified: I63.9

## 2016-12-20 HISTORY — DX: Urinary tract infection, site not specified: N39.0

## 2016-12-20 NOTE — ED Notes (Signed)
Bed: [id number] Expected date:  Expected time:  Means of arrival:  Comments: [age] yo Fall

## 2016-12-20 NOTE — ED Triage Notes (Signed)
Per [facility]- Pt reside at [facility]. FULL CODE. Unwitnessed fall. NO LOC. Pt recalls event. Pt states she tripped. Denies neck and back pain. SCCA cleared. Pt is ambulatory as her baseline. Pt is alert and oriented per her baseline. Contusion to left eye brow no other contusion noted

## 2016-12-20 NOTE — Discharge Instructions (Signed)
No aspirin or NSAID medications.

## 2016-12-20 NOTE — ED Provider Notes (Signed)
[facility]-EMERGENCY DEPT Provider Note   CSN: [id number] Arrival date & time: 12/20/16  8295     History   Chief Complaint Chief Complaint  Patient presents with  . Fall  . Bleeding/Bruising    left eye    HPI Gina Hodges is a 83 y.o. female. CC:  Fall  HPI:  Pt presents after a fall at her facility. Leaning forward to exit a whelchair, she fell forward onto the floor. No LOC. c/o pain at left eyebrow. No additional areas of pain or injury.  Pt is on ASA 81mg  daily per MAR. No additional anticoagulation per chart.  Past Medical History:  Diagnosis Date  . Anemia   . Anxiety   . Cerebral infarct (HCC)   . Dementia   . Dementia with behavioral problem   . GERD (gastroesophageal reflux disease)   . Hyperlipidemia   . Osteoporosis   . PAF (paroxysmal atrial fibrillation) (HCC)   . Thyroid disease   . Ulcerative esophagitis   . UTI (urinary tract infection)   . Vertigo     Patient Active Problem List   Diagnosis Date Noted  . Lactic acidosis   . Healthcare-associated pneumonia 02/19/2016  . SIRS (systemic inflammatory response syndrome) (HCC)   . Ingrown nail 11/12/2013  . Compression fracture of spine (HCC) 11/07/2013  . UTI (urinary tract infection) 11/07/2013  . Sternal fracture 11/07/2013  . Hypothyroidism 11/07/2013  . Hyperlipidemia   . Dementia with behavioral problem   . GERD (gastroesophageal reflux disease)   . PAF (paroxysmal atrial fibrillation) (HCC)   . Generalized anxiety disorder 04/05/2013    Past Surgical History:  Procedure Laterality Date  . ABDOMINAL HYSTERECTOMY    . APPENDECTOMY    . CHOLECYSTECTOMY      OB History    No data available       Home Medications    Prior to Admission medications   Medication Sig Start Date End Date Taking? Authorizing Provider  ALPRAZolam Prudy Feeler) 0.5 MG tablet Take 0.5 mg by mouth 2 (two) times daily.   Yes Historical Provider, MD  atorvastatin (LIPITOR) 10 MG tablet Take 5 mg by mouth at bedtime.   02/27/13  Yes Historical Provider, MD  calcium-vitamin D (OSCAL WITH D) 500-200 MG-UNIT tablet Take 1 tablet by mouth 2 (two) times daily.   Yes Historical Provider, MD  citalopram (CELEXA) 20 MG tablet Take 40 mg by mouth daily.    Yes Historical Provider, MD  donepezil (ARICEPT) 10 MG tablet Take 10 mg by mouth at bedtime.  03/22/13  Yes Historical Provider, MD  levothyroxine (SYNTHROID, LEVOTHROID) 75 MCG tablet Take 1 tablet by mouth daily before breakfast.  02/27/13  Yes Historical Provider, MD  loratadine (CLARITIN) 10 MG tablet Take 10 mg by mouth daily.   Yes Historical Provider, MD  mineral oil external liquid Place 2 drops into both ears once a week. Apply 2 drops to both ears once a week on Mondays for wax build-up   Yes Historical Provider, MD  Multiple Vitamin (MULTIVITAMIN WITH MINERALS) TABS tablet Take 1 tablet by mouth every morning.   Yes Historical Provider, MD  traMADol (ULTRAM) 50 MG tablet Take 1 tablet by mouth every 6 hours as needed for pain. Patient taking differently: Take 50 mg by mouth 3 (three) times daily.  12/18/13  Yes Mahima Glade Lloyd, MD  venlafaxine XR (EFFEXOR-XR) 75 MG 24 hr capsule Take 75 mg by mouth daily with breakfast.   Yes Historical Provider, MD  Family History Family History  Problem Relation Age of Onset  . Depression Mother   . Bipolar disorder Daughter   . Drug abuse Son     Social History Social History  Substance Use Topics  . Smoking status: Never Smoker  . Smokeless tobacco: Not on file  . Alcohol use No     Allergies   Penicillins; Sulfa antibiotics; and Codeine   Review of Systems Review of Systems  Constitutional: Negative for appetite change, chills, diaphoresis, fatigue and fever.  HENT: Positive for facial swelling. Negative for mouth sores, sore throat and trouble swallowing.   Eyes: Negative for visual disturbance.  Respiratory: Negative for cough, chest tightness, shortness of breath and wheezing.   Cardiovascular:  Negative for chest pain.  Gastrointestinal: Negative for abdominal distention, abdominal pain, diarrhea, nausea and vomiting.  Endocrine: Negative for polydipsia, polyphagia and polyuria.  Genitourinary: Negative for dysuria, frequency and hematuria.  Musculoskeletal: Negative for gait problem.  Skin: Negative for color change, pallor and rash.  Neurological: Negative for dizziness, syncope, light-headedness and headaches.  Hematological: Does not bruise/bleed easily.  Psychiatric/Behavioral: Negative for behavioral problems and confusion.     Physical Exam Updated Vital Signs BP (!) 113/98   Pulse 76   Temp 98.6 F (37 C) (Oral)   Resp 19   SpO2 96%   Physical Exam  Constitutional: She is oriented to person, place, and time. She appears well-developed and well-nourished. No distress.  HENT:  Head: Normocephalic. Head is with left periorbital erythema.    Eyes: Conjunctivae are normal. Pupils are equal, round, and reactive to light. No scleral icterus.  Neck: Normal range of motion. Neck supple. No thyromegaly present.  Cardiovascular: Normal rate and regular rhythm.  Exam reveals no gallop and no friction rub.   No murmur heard. Pulmonary/Chest: Effort normal and breath sounds normal. No respiratory distress. She has no wheezes. She has no rales.  Abdominal: Soft. Bowel sounds are normal. She exhibits no distension. There is no tenderness. There is no rebound.  Musculoskeletal: Normal range of motion.  Neurological: She is alert and oriented to person, place, and time.  Skin: Skin is warm and dry. No rash noted.  Psychiatric: She has a normal mood and affect. Her behavior is normal.     ED Treatments / Results  Labs (all labs ordered are listed, but only abnormal results are displayed) Labs Reviewed - No data to display  EKG  EKG Interpretation None       Radiology Ct Head Wo Contrast  Result Date: 12/20/2016 CLINICAL DATA:  Unwitnessed fall. No loss of  consciousness. No neck or back pain. Alert and oriented. EXAM: CT HEAD WITHOUT CONTRAST CT MAXILLOFACIAL WITHOUT CONTRAST CT CERVICAL SPINE WITHOUT CONTRAST TECHNIQUE: Multidetector CT imaging of the head, cervical spine, and maxillofacial structures were performed using the standard protocol without intravenous contrast. Multiplanar CT image reconstructions of the cervical spine and maxillofacial structures were also generated. COMPARISON:  None. FINDINGS: CT HEAD FINDINGS Brain: Small area of hyperdensity along the left frontal lobe likely reflecting a small amount of subarachnoid hemorrhage. No evidence of acute infarction, extra-axial collection, ventriculomegaly, or mass effect. Generalized cerebral atrophy. Periventricular white matter low attenuation likely secondary to microangiopathy. Vascular: Cerebrovascular atherosclerotic calcifications are noted. Skull: Negative for fracture or focal lesion. Orbits:  Visualized portions of the orbits are unremarkable. Other: None. CT MAXILLOFACIAL FINDINGS Patient motion degrades image quality limiting evaluation. Osseous: No fracture or mandibular dislocation. No destructive process. Orbits: Negative. No traumatic or inflammatory  finding. Sinuses: Clear. Soft tissues: Negative. CT CERVICAL SPINE FINDINGS Patient motion degrades image quality limiting evaluation. Alignment: Normal. Skull base and vertebrae: Patient motion limits evaluation of the odontoid process. Otherwise no acute fracture. No primary bone lesion or focal pathologic process. Soft tissues and spinal canal: No prevertebral fluid or swelling. No visible canal hematoma. Disc levels: Disc spaces are relatively well maintained. Bilateral facet arthropathy at C4-5 and C5-6. No foraminal stenosis. Upper chest: Lung apices are clear. Other: No fluid collection or hematoma. IMPRESSION: 1. Small area of hyperdensity along the left frontal lobe likely reflecting a small amount of subarachnoid hemorrhage. 2.  Otherwise no acute intracranial pathology. 3. Limited evaluation of the maxillofacial bones secondary to patient motion degrading image quality. No definite acute osseous injury of the maxillofacial bones. 4. Patient motion limits evaluation of the odontoid process. Otherwise no acute osseous injury the cervical spine. Critical Value/emergent results were called by telephone at the time of interpretation on 12/20/2016 at 11:10 am to Dr. Rolland PorterMARK Yun Gutierrez , who verbally acknowledged these results. Electronically Signed   By: Elige KoHetal  Patel   On: 12/20/2016 11:10   Ct Cervical Spine Wo Contrast  Result Date: 12/20/2016 CLINICAL DATA:  Unwitnessed fall. No loss of consciousness. No neck or back pain. Alert and oriented. EXAM: CT HEAD WITHOUT CONTRAST CT MAXILLOFACIAL WITHOUT CONTRAST CT CERVICAL SPINE WITHOUT CONTRAST TECHNIQUE: Multidetector CT imaging of the head, cervical spine, and maxillofacial structures were performed using the standard protocol without intravenous contrast. Multiplanar CT image reconstructions of the cervical spine and maxillofacial structures were also generated. COMPARISON:  None. FINDINGS: CT HEAD FINDINGS Brain: Small area of hyperdensity along the left frontal lobe likely reflecting a small amount of subarachnoid hemorrhage. No evidence of acute infarction, extra-axial collection, ventriculomegaly, or mass effect. Generalized cerebral atrophy. Periventricular white matter low attenuation likely secondary to microangiopathy. Vascular: Cerebrovascular atherosclerotic calcifications are noted. Skull: Negative for fracture or focal lesion. Orbits:  Visualized portions of the orbits are unremarkable. Other: None. CT MAXILLOFACIAL FINDINGS Patient motion degrades image quality limiting evaluation. Osseous: No fracture or mandibular dislocation. No destructive process. Orbits: Negative. No traumatic or inflammatory finding. Sinuses: Clear. Soft tissues: Negative. CT CERVICAL SPINE FINDINGS Patient  motion degrades image quality limiting evaluation. Alignment: Normal. Skull base and vertebrae: Patient motion limits evaluation of the odontoid process. Otherwise no acute fracture. No primary bone lesion or focal pathologic process. Soft tissues and spinal canal: No prevertebral fluid or swelling. No visible canal hematoma. Disc levels: Disc spaces are relatively well maintained. Bilateral facet arthropathy at C4-5 and C5-6. No foraminal stenosis. Upper chest: Lung apices are clear. Other: No fluid collection or hematoma. IMPRESSION: 1. Small area of hyperdensity along the left frontal lobe likely reflecting a small amount of subarachnoid hemorrhage. 2. Otherwise no acute intracranial pathology. 3. Limited evaluation of the maxillofacial bones secondary to patient motion degrading image quality. No definite acute osseous injury of the maxillofacial bones. 4. Patient motion limits evaluation of the odontoid process. Otherwise no acute osseous injury the cervical spine. Critical Value/emergent results were called by telephone at the time of interpretation on 12/20/2016 at 11:10 am to Dr. Rolland PorterMARK Epifania Littrell , who verbally acknowledged these results. Electronically Signed   By: Elige KoHetal  Patel   On: 12/20/2016 11:10   Ct Maxillofacial Wo Contrast  Result Date: 12/20/2016 CLINICAL DATA:  Unwitnessed fall. No loss of consciousness. No neck or back pain. Alert and oriented. EXAM: CT HEAD WITHOUT CONTRAST CT MAXILLOFACIAL WITHOUT CONTRAST CT CERVICAL SPINE WITHOUT  CONTRAST TECHNIQUE: Multidetector CT imaging of the head, cervical spine, and maxillofacial structures were performed using the standard protocol without intravenous contrast. Multiplanar CT image reconstructions of the cervical spine and maxillofacial structures were also generated. COMPARISON:  None. FINDINGS: CT HEAD FINDINGS Brain: Small area of hyperdensity along the left frontal lobe likely reflecting a small amount of subarachnoid hemorrhage. No evidence of acute  infarction, extra-axial collection, ventriculomegaly, or mass effect. Generalized cerebral atrophy. Periventricular white matter low attenuation likely secondary to microangiopathy. Vascular: Cerebrovascular atherosclerotic calcifications are noted. Skull: Negative for fracture or focal lesion. Orbits:  Visualized portions of the orbits are unremarkable. Other: None. CT MAXILLOFACIAL FINDINGS Patient motion degrades image quality limiting evaluation. Osseous: No fracture or mandibular dislocation. No destructive process. Orbits: Negative. No traumatic or inflammatory finding. Sinuses: Clear. Soft tissues: Negative. CT CERVICAL SPINE FINDINGS Patient motion degrades image quality limiting evaluation. Alignment: Normal. Skull base and vertebrae: Patient motion limits evaluation of the odontoid process. Otherwise no acute fracture. No primary bone lesion or focal pathologic process. Soft tissues and spinal canal: No prevertebral fluid or swelling. No visible canal hematoma. Disc levels: Disc spaces are relatively well maintained. Bilateral facet arthropathy at C4-5 and C5-6. No foraminal stenosis. Upper chest: Lung apices are clear. Other: No fluid collection or hematoma. IMPRESSION: 1. Small area of hyperdensity along the left frontal lobe likely reflecting a small amount of subarachnoid hemorrhage. 2. Otherwise no acute intracranial pathology. 3. Limited evaluation of the maxillofacial bones secondary to patient motion degrading image quality. No definite acute osseous injury of the maxillofacial bones. 4. Patient motion limits evaluation of the odontoid process. Otherwise no acute osseous injury the cervical spine. Critical Value/emergent results were called by telephone at the time of interpretation on 12/20/2016 at 11:10 am to Dr. Rolland Porter , who verbally acknowledged these results. Electronically Signed   By: Elige Ko   On: 12/20/2016 11:10    Procedures Procedures (including critical care  time)  Medications Ordered in ED Medications - No data to display   Initial Impression / Assessment and Plan / ED Course  I have reviewed the triage vital signs and the nursing notes.  Pertinent labs & imaging results that were available during my care of the patient were reviewed by me and considered in my medical decision making (see chart for details).     She is awake alert. She is oriented to person and place. Does not know the date. Otherwise neurologically intact. CT scan shows subtle area of probable traumatic arachnoid hemorrhage. No organized subdural or epidural. Otherwise patient is at her baseline. Think she is appropriate for discharge back to her facility. Hold aspirin and anti-inflammatories and till primary care evaluation.  Final Clinical Impressions(s) / ED Diagnoses   Final diagnoses:  Injury of head, initial encounter  Subarachnoid hemorrhage [facility])    New Prescriptions New Prescriptions   No medications on file     Rolland Porter, MD 12/20/16 1349

## 2016-12-20 NOTE — ED Notes (Signed)
ED Provider at bedside. 

## 2016-12-20 NOTE — ED Notes (Signed)
Patient transported to CT 

## 2016-12-20 NOTE — ED Notes (Signed)
[facility] notified of need for Pt transportation 

## 2016-12-20 NOTE — ED Notes (Signed)
Bed: [facility] Expected date:  Expected time:  Means of arrival:  Comments: 

## 2018-11-16 ENCOUNTER — Emergency Department (HOSPITAL_COMMUNITY): Payer: Medicare HMO

## 2018-11-16 ENCOUNTER — Inpatient Hospital Stay (HOSPITAL_COMMUNITY)
Admission: EM | Admit: 2018-11-16 | Discharge: 2018-11-19 | DRG: 312 | Disposition: A | Discharge status: Skilled Nursing Facility | Payer: Medicare HMO | Attending: Internal Medicine | Admitting: Internal Medicine

## 2018-11-16 ENCOUNTER — Other Ambulatory Visit: Payer: Self-pay

## 2018-11-16 DIAGNOSIS — E785 Hyperlipidemia, unspecified: Secondary | ICD-10-CM | POA: Diagnosis present

## 2018-11-16 DIAGNOSIS — Z791 Long term (current) use of non-steroidal anti-inflammatories (NSAID): Secondary | ICD-10-CM

## 2018-11-16 DIAGNOSIS — Z79890 Hormone replacement therapy (postmenopausal): Secondary | ICD-10-CM

## 2018-11-16 DIAGNOSIS — B9620 Unspecified Escherichia coli [E. coli] as the cause of diseases classified elsewhere: Secondary | ICD-10-CM | POA: Diagnosis present

## 2018-11-16 DIAGNOSIS — F03918 Unspecified dementia, unspecified severity, with other behavioral disturbance: Secondary | ICD-10-CM | POA: Diagnosis present

## 2018-11-16 DIAGNOSIS — Z885 Allergy status to narcotic agent status: Secondary | ICD-10-CM

## 2018-11-16 DIAGNOSIS — I480 Paroxysmal atrial fibrillation: Secondary | ICD-10-CM | POA: Diagnosis present

## 2018-11-16 DIAGNOSIS — K219 Gastro-esophageal reflux disease without esophagitis: Secondary | ICD-10-CM | POA: Diagnosis present

## 2018-11-16 DIAGNOSIS — M810 Age-related osteoporosis without current pathological fracture: Secondary | ICD-10-CM | POA: Diagnosis present

## 2018-11-16 DIAGNOSIS — F0391 Unspecified dementia with behavioral disturbance: Secondary | ICD-10-CM | POA: Diagnosis present

## 2018-11-16 DIAGNOSIS — Z880 Allergy status to penicillin: Secondary | ICD-10-CM

## 2018-11-16 DIAGNOSIS — N390 Urinary tract infection, site not specified: Secondary | ICD-10-CM

## 2018-11-16 DIAGNOSIS — I10 Essential (primary) hypertension: Secondary | ICD-10-CM | POA: Diagnosis present

## 2018-11-16 DIAGNOSIS — H9190 Unspecified hearing loss, unspecified ear: Secondary | ICD-10-CM | POA: Diagnosis present

## 2018-11-16 DIAGNOSIS — J9601 Acute respiratory failure with hypoxia: Secondary | ICD-10-CM | POA: Diagnosis present

## 2018-11-16 DIAGNOSIS — R55 Syncope and collapse: Secondary | ICD-10-CM | POA: Diagnosis not present

## 2018-11-16 DIAGNOSIS — Z993 Dependence on wheelchair: Secondary | ICD-10-CM

## 2018-11-16 DIAGNOSIS — Z882 Allergy status to sulfonamides status: Secondary | ICD-10-CM

## 2018-11-16 DIAGNOSIS — R0600 Dyspnea, unspecified: Secondary | ICD-10-CM

## 2018-11-16 DIAGNOSIS — Z818 Family history of other mental and behavioral disorders: Secondary | ICD-10-CM

## 2018-11-16 DIAGNOSIS — E039 Hypothyroidism, unspecified: Secondary | ICD-10-CM | POA: Diagnosis present

## 2018-11-16 DIAGNOSIS — Z8673 Personal history of transient ischemic attack (TIA), and cerebral infarction without residual deficits: Secondary | ICD-10-CM

## 2018-11-16 DIAGNOSIS — I6529 Occlusion and stenosis of unspecified carotid artery: Secondary | ICD-10-CM | POA: Diagnosis present

## 2018-11-16 DIAGNOSIS — F411 Generalized anxiety disorder: Secondary | ICD-10-CM | POA: Diagnosis present

## 2018-11-16 LAB — URINALYSIS, ROUTINE W REFLEX MICROSCOPIC
Bilirubin Urine: NEGATIVE
Glucose, UA: NEGATIVE mg/dL
Hgb urine dipstick: NEGATIVE
Ketones, ur: 5 mg/dL — AB
Nitrite: POSITIVE — AB
PROTEIN: NEGATIVE mg/dL
Specific Gravity, Urine: 1.03 (ref 1.005–1.030)
pH: 5 (ref 5.0–8.0)

## 2018-11-16 LAB — CBC WITH DIFFERENTIAL/PLATELET
Abs Immature Granulocytes: 0.05 10*3/uL (ref 0.00–0.07)
BASOS ABS: 0.1 10*3/uL (ref 0.0–0.1)
Basophils Relative: 0 %
EOS PCT: 3 %
Eosinophils Absolute: 0.3 10*3/uL (ref 0.0–0.5)
HCT: 36.9 % (ref 36.0–46.0)
Hemoglobin: 11.1 g/dL — ABNORMAL LOW (ref 12.0–15.0)
Immature Granulocytes: 0 %
Lymphocytes Relative: 16 %
Lymphs Abs: 1.9 10*3/uL (ref 0.7–4.0)
MCH: 28.6 pg (ref 26.0–34.0)
MCHC: 30.1 g/dL (ref 30.0–36.0)
MCV: 95.1 fL (ref 80.0–100.0)
MONO ABS: 1.2 10*3/uL — AB (ref 0.1–1.0)
Monocytes Relative: 10 %
NEUTROS ABS: 8.3 10*3/uL — AB (ref 1.7–7.7)
NEUTROS PCT: 71 %
Platelets: 342 10*3/uL (ref 150–400)
RBC: 3.88 MIL/uL (ref 3.87–5.11)
RDW: 15.3 % (ref 11.5–15.5)
WBC: 11.8 10*3/uL — ABNORMAL HIGH (ref 4.0–10.5)
nRBC: 0 % (ref 0.0–0.2)

## 2018-11-16 LAB — LACTIC ACID, PLASMA: Lactic Acid, Venous: 0.8 mmol/L (ref 0.5–1.9)

## 2018-11-16 LAB — BASIC METABOLIC PANEL
Anion gap: 7 (ref 5–15)
BUN: 23 mg/dL (ref 8–23)
CHLORIDE: 101 mmol/L (ref 98–111)
CO2: 27 mmol/L (ref 22–32)
Calcium: 9.3 mg/dL (ref 8.9–10.3)
Creatinine, Ser: 0.76 mg/dL (ref 0.44–1.00)
GFR calc Af Amer: >60 mL/min (ref >60)
GFR calc non Af Amer: >60 mL/min (ref >60)
Glucose, Bld: 113 mg/dL — ABNORMAL HIGH (ref 70–99)
Potassium: 4.3 mmol/L (ref 3.5–5.1)
Sodium: 135 mmol/L (ref 135–145)

## 2018-11-16 LAB — TROPONIN I: Troponin I: <0.03 ng/mL (ref <0.03)

## 2018-11-16 LAB — CBG MONITORING, ED: Glucose-Capillary: 98 mg/dL (ref 70–99)

## 2018-11-16 MED ORDER — SODIUM CHLORIDE 0.9 % IV BOLUS
500.0000 mL | Freq: Once | INTRAVENOUS | Status: AC
  Administered 2018-11-16: 500 mL via INTRAVENOUS

## 2018-11-16 MED ORDER — SODIUM CHLORIDE 0.9 % IV SOLN
1.0000 g | Freq: Once | INTRAVENOUS | Status: AC
  Administered 2018-11-16: 1 g via INTRAVENOUS
  Filled 2018-11-16: qty 10

## 2018-11-16 NOTE — ED Provider Notes (Signed)
[facility]-EMERGENCY DEPT Provider Note   CSN: [id number] Arrival date & time: 11/16/18  1927     History   Chief Complaint Chief Complaint  Patient presents with  . Near Syncope    Pt came to the ED from [facility] due to syncopal episode when transferring.  Pt was eased to the floor    HPI Gina Hodges is a 85 y.o. female.  HPI   Patient is an 85 year old female with a history of paroxysmal atrial fibrillation not on anticoagulation, hyperlipidemia, mild dementia presenting for syncopal episode.  Collateral information was obtained from the nurse at [facility] [place], who states that patient was found "slumped over against the wall" on the toilet earlier this evening.  She typically is wheelchair bound and can sometimes take her self to the bathroom and transfer to the toilet.  There is no evidence that she had any trauma or fell to the ground.  The CNA stated that she was unresponsive and she was "lowered to the ground and CPR was started".  She received a couple chest compressions and a single rescue breath, and became more alert.  She was confused, but responsive.  According to RN taking care of the patient, she is slightly more confused, however was responsive for EMS.  CBG normal on scene.    Currently, patient states "I do not know why I am here".  Past Medical History:  Diagnosis Date  . Anemia   . Anxiety   . Cerebral infarct (HCC)   . Dementia   . Dementia with behavioral problem   . GERD (gastroesophageal reflux disease)   . Hyperlipidemia   . Osteoporosis   . PAF (paroxysmal atrial fibrillation) (HCC)   . Thyroid disease   . Ulcerative esophagitis   . UTI (urinary tract infection)   . Vertigo     Patient Active Problem List   Diagnosis Date Noted  . Lactic acidosis   . Healthcare-associated pneumonia 02/19/2016  . SIRS (systemic inflammatory response syndrome) (HCC)   . Ingrown nail 11/12/2013  . Compression fracture of spine  (HCC) 11/07/2013  . UTI (urinary tract infection) 11/07/2013  . Sternal fracture 11/07/2013  . Hypothyroidism 11/07/2013  . Hyperlipidemia   . Dementia with behavioral problem (HCC)   . GERD (gastroesophageal reflux disease)   . PAF (paroxysmal atrial fibrillation) (HCC)   . Generalized anxiety disorder 04/05/2013    Past Surgical History:  Procedure Laterality Date  . ABDOMINAL HYSTERECTOMY    . APPENDECTOMY    . CHOLECYSTECTOMY       OB History   No obstetric history on file.      Home Medications    Prior to Admission medications   Medication Sig Start Date End Date Taking? Authorizing Provider  acetaminophen (TYLENOL) 650 MG CR tablet Take 650 mg by mouth every 8 (eight) hours as needed for pain.   Yes [provider]  ALPRAZolam Prudy Feeler) 0.5 MG tablet Take 0.5 mg by mouth 2 (two) times daily.   Yes [provider]  alum & mag hydroxide-simeth (MAALOX/MYLANTA) 200-200-20 MG/5ML suspension Take 30 mLs by mouth every 6 (six) hours as needed for indigestion or heartburn.   Yes [provider]  atorvastatin (LIPITOR) 10 MG tablet Take 5 mg by mouth at bedtime.  02/27/13  Yes [provider]  calcium-vitamin D (OSCAL WITH D) 500-200 MG-UNIT tablet Take 1 tablet by mouth 2 (two) times daily.   Yes [provider]  donepezil (ARICEPT) 10  MG tablet Take 10 mg by mouth at bedtime.  03/22/13  Yes [provider]  levothyroxine (SYNTHROID, LEVOTHROID) 75 MCG tablet Take 1 tablet by mouth daily before breakfast.  02/27/13  Yes [provider]  loperamide (IMODIUM) 2 MG capsule Take 2-4 mg by mouth See admin instructions. Give 1 capsule by mouth every 2 hours as needed for diarrhea. 4mg  for first loose stool then after each loose stool 2mg . 11/08/18  Yes [provider]  loratadine (CLARITIN) 10 MG tablet Take 10 mg by mouth daily.   Yes [provider]  Melatonin 5 MG TABS Take 5 mg by mouth at bedtime.   Yes  [provider]  meloxicam (MOBIC) 15 MG tablet Take 15 mg by mouth daily. 10/30/18  Yes [provider]  mineral oil external liquid Place 2 drops into both ears once a week. Apply 2 drops to both ears once a week on Mondays for wax build-up   Yes [provider]  Multiple Vitamin (MULTIVITAMIN WITH MINERALS) TABS tablet Take 1 tablet by mouth every morning.   Yes [provider]  omeprazole (PRILOSEC) 20 MG capsule Take 20 mg by mouth 2 (two) times daily. 10/17/18  Yes [provider]  polyethylene glycol (MIRALAX / GLYCOLAX) packet Take 17 g by mouth daily as needed for mild constipation.   Yes [provider]  Skin Protectants, Misc. (EUCERIN) cream Apply 1 application topically at bedtime as needed (itching).   Yes [provider]  traMADol (ULTRAM) 50 MG tablet Take 1 tablet by mouth every 6 hours as needed for pain. Patient taking differently: Take 50 mg by mouth 2 (two) times daily.  12/18/13  Yes Oneal GroutPandey, Mahima, MD  traZODone (DESYREL) 150 MG tablet Take 75 mg by mouth at bedtime. 10/27/18  Yes [provider]  venlafaxine XR (EFFEXOR-XR) 75 MG 24 hr capsule Take 75 mg by mouth daily with breakfast.   Yes [provider]    Family History Family History  Problem Relation Age of Onset  . Depression Mother   . Bipolar disorder Daughter   . Drug abuse Son     Social History Social History   Tobacco Use  . Smoking status: Never Smoker  Substance Use Topics  . Alcohol use: No  . Drug use: No     Allergies   Penicillins; Sulfa antibiotics; and Codeine   Review of Systems Review of Systems  Respiratory: Negative for chest tightness and shortness of breath.   Cardiovascular: Negative for chest pain.   Unable to perform secondary to dementia.   Physical Exam Updated Vital Signs BP 136/77   Pulse 78   Temp 97.7 F (36.5 C) (Axillary)   Resp 15   SpO2 99%   Physical Exam Vitals signs and  nursing note reviewed.  Constitutional:      General: She is not in acute distress.    Appearance: Normal appearance. She is well-developed. She is not ill-appearing.     Comments: Alert, but hard of hearing.  HENT:     Head: Normocephalic and atraumatic.     Mouth/Throat:     Mouth: Mucous membranes are moist.  Eyes:     Extraocular Movements: Extraocular movements intact.     Conjunctiva/sclera: Conjunctivae normal.     Pupils: Pupils are equal, round, and reactive to light.  Neck:     Musculoskeletal: Normal range of motion and neck supple.  Cardiovascular:     Rate and Rhythm: Normal rate and  regular rhythm.     Pulses: Normal pulses.          Radial pulses are 2+ on the right side and 2+ on the left side.     Heart sounds: S1 normal and S2 normal. No murmur.     Comments: No lower extremity edema.  No calf tenderness. Pulmonary:     Effort: Pulmonary effort is normal.     Breath sounds: Normal breath sounds. No wheezing or rales.  Abdominal:     General: There is no distension.     Palpations: Abdomen is soft.     Tenderness: There is no abdominal tenderness. There is no guarding.  Genitourinary:    Comments: No skin breakdown of buttocks or perineum.  Musculoskeletal: Normal range of motion.        General: No deformity.  Lymphadenopathy:     Cervical: No cervical adenopathy.  Skin:    General: Skin is warm and dry.     Findings: No erythema or rash.  Neurological:     Mental Status: She is alert.     Comments: Cranial nerves grossly intact. Patient moves extremities symmetrically and with good coordination.  Psychiatric:        Behavior: Behavior normal.        Thought Content: Thought content normal.        Judgment: Judgment normal.      ED Treatments / Results  Labs (all labs ordered are listed, but only abnormal results are displayed) Labs Reviewed  CBC WITH DIFFERENTIAL/PLATELET - Abnormal; Notable for the following components:      Result Value    WBC 11.8 (*)    Hemoglobin 11.1 (*)    Neutro Abs 8.3 (*)    Monocytes Absolute 1.2 (*)    All other components within normal limits  BASIC METABOLIC PANEL - Abnormal; Notable for the following components:   Glucose, Bld 113 (*)    All other components within normal limits  URINALYSIS, ROUTINE W REFLEX MICROSCOPIC - Abnormal; Notable for the following components:   Color, Urine AMBER (*)    APPearance HAZY (*)    Ketones, ur 5 (*)    Nitrite POSITIVE (*)    Leukocytes,Ua SMALL (*)    WBC, UA >50 (*)    Bacteria, UA MANY (*)    All other components within normal limits  URINE CULTURE  CULTURE, BLOOD (ROUTINE X 2)  CULTURE, BLOOD (ROUTINE X 2)  LACTIC ACID, PLASMA  TROPONIN I  CBG MONITORING, ED    EKG None  Radiology Dg Chest 1 View  Result Date: 11/16/2018 CLINICAL DATA:  Syncope. EXAM: CHEST  1 VIEW COMPARISON:  Radiographs of Feb 22, 2016. FINDINGS: Stable cardiomediastinal silhouette. No pneumothorax or pleural effusion is noted. Both lungs are clear. The visualized skeletal structures are unremarkable. IMPRESSION: No acute cardiopulmonary abnormality seen. Electronically Signed   By: Lupita Raider, M.D.   On: 11/16/2018 21:12   Ct Head Wo Contrast  Result Date: 11/16/2018 CLINICAL DATA:  Syncopal episode EXAM: CT HEAD WITHOUT CONTRAST TECHNIQUE: Contiguous axial images were obtained from the base of the skull through the vertex without intravenous contrast. COMPARISON:  12/20/2016 FINDINGS: BRAIN: There is chronic stable moderate sulcal and ventricular prominence consistent with superficial and central atrophy. No intraparenchymal hemorrhage, mass effect nor midline shift. Periventricular and subcortical white matter hypodensities consistent with chronic moderate small vessel ischemic disease are identified. No acute large vascular territory infarcts. No abnormal extra-axial fluid collections. Basal cisterns are  not effaced and midline. VASCULAR: Moderate calcific  atherosclerosis of the carotid siphons. SKULL: No skull fracture. No significant scalp soft tissue swelling. SINUSES/ORBITS: The mastoid air-cells are clear. The included paranasal sinuses are well-aerated.Bilateral cataract extractions. Intact orbits and globes. OTHER: None. IMPRESSION: Atrophy with chronic moderate small vessel ischemic disease. No acute intracranial abnormality. Electronically Signed   By: Tollie Eth M.D.   On: 11/16/2018 21:42    Procedures Procedures (including critical care time)  Medications Ordered in ED Medications  cefTRIAXone (ROCEPHIN) 1 g in sodium chloride 0.9 % 100 mL IVPB (has no administration in time range)  sodium chloride 0.9 % bolus 500 mL (500 mLs Intravenous New Bag/Given 11/16/18 2140)     Initial Impression / Assessment and Plan / ED Course  I have reviewed the triage vital signs and the nursing notes.  Pertinent labs & imaging results that were available during my care of the patient were reviewed by me and considered in my medical decision making (see chart for details).     DDx includes: Orthostatic hypotension Stroke Vertebral artery dissection/stenosis Dysrhythmia PE Vasovagal/neurocardiogenic syncope Aortic stenosis Valvular disorder/Cardiomyopathy Anemia  Patient is nontoxic-appearing and hemodynamically stable on my evaluation.  Per discussion with RN, she appears to be at her baseline.  She is significantly hard of hearing which may impact oriented response, however she is able to answer coherently with normal speech when she is able to hear.    Do not suspect stroke as no focal neurologic deficits are appreciated on exam.  Do not suspect hypertensive hemorrhage, as patient has normal head CT.  Did not spike pulmonary embolism, as patient has normal vital signs with no tachypnea, tachycardia, or hypoxia, and she is not on any beta-blockers that would mask tachycardia.  Patient has stable vital signs and appears in no acute distress,  therefore doubt aortic dissection.  Do have concerns about vasovagal syncope given that she was having a bowel movement on the toilet when this occurred by description of events.  Work-up in the emergency department demonstrating slight leukocytosis.  Normal renal function.  Troponin is negative.  CBG is not hypoglycemic.  Lactic acid is normal.  Patient has evidence of urinary tract infection.  Patient given 500 mL of fluid and Rocephin.  She has tolerated Rocephin previously.  Patient excepted by Dr. Mikeal Hawthorne for admission.  Appreciate his involvement in the care of this patient.  Final Clinical Impressions(s) / ED Diagnoses   Final diagnoses:  Syncope, unspecified syncope type  Urinary tract infection without hematuria, site unspecified    ED Discharge Orders    None       Delia Chimes 11/17/18 0017    Arby Barrette, MD 11/17/18 1256

## 2018-11-16 NOTE — ED Notes (Signed)
ED TO INPATIENT HANDOFF REPORT  Name/Age/Gender Gina Hodges 85 y.o. female  Code Status Code Status History    Date Active Date Inactive Code Status Order ID Comments User Context   02/19/2016 1606 02/24/2016 1855 DNR [id number]  Raliegh IpGottschalk, Ashly M, DO Inpatient    Questions for Most Recent Historical Code Status (Order [id number])    Question Answer Comment   In the event of cardiac or respiratory ARREST Do not call a "code blue"    In the event of cardiac or respiratory ARREST Do not perform Intubation, CPR, defibrillation or ACLS    In the event of cardiac or respiratory ARREST Use medication by any route, position, wound care, and other measures to relive pain and suffering. May use oxygen, suction and manual treatment of airway obstruction as needed for comfort.       Home/SNF/Other Home  Chief Complaint syncope  Level of Care/Admitting Diagnosis ED Disposition    ED Disposition Condition Comment   Admit  [facility] [[id number]]  Level of Care: Telemetry [5]  Admit to tele based on following criteria: Eval of Syncope  Diagnosis: Syncope and collapse [780.2.ICD-9-CM]  Admitting Physician: Rometta EmeryGARBA, MOHAMMAD L [[id number]]  Attending Physician: Rometta EmeryGARBA, MOHAMMAD L [[id number]]  PT Class (Do Not Modify): Observation [104]  PT Acc Code (Do Not Modify): Observation [10022]       Medical History Past Medical History:  Diagnosis Date  . Anemia   . Anxiety   . Cerebral infarct (HCC)   . Dementia   . Dementia with behavioral problem   . GERD (gastroesophageal reflux disease)   . Hyperlipidemia   . Osteoporosis   . PAF (paroxysmal atrial fibrillation) (HCC)   . Thyroid disease   . Ulcerative esophagitis   . UTI (urinary tract infection)   . Vertigo     Allergies Allergies  Allergen Reactions  . Penicillins Other (See Comments)    Per MAR, unknown reaction  . Sulfa Antibiotics Other (See Comments)    Per MAR, unknown reaction  . Codeine  Palpitations    IV Location/Drains/Wounds Patient Lines/Drains/Airways Status   Active Line/Drains/Airways    Name:   Placement date:   Placement time:   Site:   Days:   Peripheral IV 11/16/18 Left Antecubital   11/16/18    2008    Antecubital   less than 1          Labs/Imaging Results for orders placed or performed during the hospital encounter of 11/16/18 (from the past 48 hour(s))  CBC WITH DIFFERENTIAL     Status: Abnormal   Collection Time: 11/16/18  9:32 PM  Result Value Ref Range   WBC 11.8 (H) 4.0 - 10.5 K/uL   RBC 3.88 3.87 - 5.11 MIL/uL   Hemoglobin 11.1 (L) 12.0 - 15.0 g/dL   HCT 78.236.9 95.636.0 - 21.346.0 %   MCV 95.1 80.0 - 100.0 fL   MCH 28.6 26.0 - 34.0 pg   MCHC 30.1 30.0 - 36.0 g/dL   RDW 08.615.3 57.811.5 - 46.915.5 %   Platelets 342 150 - 400 K/uL   nRBC 0.0 0.0 - 0.2 %   Neutrophils Relative % 71 %   Neutro Abs 8.3 (H) 1.7 - 7.7 K/uL   Lymphocytes Relative 16 %   Lymphs Abs 1.9 0.7 - 4.0 K/uL   Monocytes Relative 10 %   Monocytes Absolute 1.2 (H) 0.1 - 1.0 K/uL   Eosinophils Relative 3 %   Eosinophils Absolute 0.3 0.0 -  0.5 K/uL   Basophils Relative 0 %   Basophils Absolute 0.1 0.0 - 0.1 K/uL   Immature Granulocytes 0 %   Abs Immature Granulocytes 0.05 0.00 - 0.07 K/uL    Comment: Performed at [facility], [address] [place], [place] [address]  Basic metabolic panel     Status: Abnormal   Collection Time: 11/16/18  9:32 PM  Result Value Ref Range   Sodium 135 135 - 145 mmol/L   Potassium 4.3 3.5 - 5.1 mmol/L   Chloride 101 98 - 111 mmol/L   CO2 27 22 - 32 mmol/L   Glucose, Bld 113 (H) 70 - 99 mg/dL   BUN 23 8 - 23 mg/dL   Creatinine, Ser 9.02 0.44 - 1.00 mg/dL   Calcium 9.3 8.9 - 40.9 mg/dL   GFR calc non Af Amer >60 >60 mL/min   GFR calc Af Amer >60 >60 mL/min   Anion gap 7 5 - 15    Comment: Performed at [facility], [address], [place], [place] [address]  Lactic acid, plasma     Status: None   Collection Time:  11/16/18  9:32 PM  Result Value Ref Range   Lactic Acid, Venous 0.8 0.5 - 1.9 mmol/L    Comment: Performed at [facility], [address] [place], [place] [address]  Troponin I - ONCE - STAT     Status: None   Collection Time: 11/16/18  9:32 PM  Result Value Ref Range   Troponin I <0.03 <0.03 ng/mL    Comment: Performed at [facility], [address], [place], [place] [address]  Urinalysis, Routine w reflex microscopic     Status: Abnormal   Collection Time: 11/16/18  9:44 PM  Result Value Ref Range   Color, Urine AMBER (A) YELLOW    Comment: BIOCHEMICALS MAY BE AFFECTED BY COLOR   APPearance HAZY (A) CLEAR   Specific Gravity, Urine 1.030 1.005 - 1.030   pH 5.0 5.0 - 8.0   Glucose, UA NEGATIVE NEGATIVE mg/dL   Hgb urine dipstick NEGATIVE NEGATIVE   Bilirubin Urine NEGATIVE NEGATIVE   Ketones, ur 5 (A) NEGATIVE mg/dL   Protein, ur NEGATIVE NEGATIVE mg/dL   Nitrite POSITIVE (A) NEGATIVE   Leukocytes,Ua SMALL (A) NEGATIVE   RBC / HPF 11-20 0 - 5 RBC/hpf   WBC, UA >50 (H) 0 - 5 WBC/hpf   Bacteria, UA MANY (A) NONE SEEN   Mucus PRESENT    Hyaline Casts, UA PRESENT     Comment: Performed at [facility], [address] [place], [place] [address]  CBG monitoring, ED     Status: None   Collection Time: 11/16/18 10:08 PM  Result Value Ref Range   Glucose-Capillary 98 70 - 99 mg/dL   Dg Chest 1 View  Result Date: 11/16/2018 CLINICAL DATA:  Syncope. EXAM: CHEST  1 VIEW COMPARISON:  Radiographs of Feb 22, 2016. FINDINGS: Stable cardiomediastinal silhouette. No pneumothorax or pleural effusion is noted. Both lungs are clear. The visualized skeletal structures are unremarkable. IMPRESSION: No acute cardiopulmonary abnormality seen. Electronically Signed   By: Lupita Raider, M.D.   On: 11/16/2018 21:12   Ct Head Wo Contrast  Result Date: 11/16/2018 CLINICAL DATA:  Syncopal episode EXAM: CT HEAD WITHOUT CONTRAST TECHNIQUE:  Contiguous axial images were obtained from the base of the skull through the vertex without intravenous contrast. COMPARISON:  12/20/2016 FINDINGS: BRAIN: There is chronic stable moderate  sulcal and ventricular prominence consistent with superficial and central atrophy. No intraparenchymal hemorrhage, mass effect nor midline shift. Periventricular and subcortical white matter hypodensities consistent with chronic moderate small vessel ischemic disease are identified. No acute large vascular territory infarcts. No abnormal extra-axial fluid collections. Basal cisterns are not effaced and midline. VASCULAR: Moderate calcific atherosclerosis of the carotid siphons. SKULL: No skull fracture. No significant scalp soft tissue swelling. SINUSES/ORBITS: The mastoid air-cells are clear. The included paranasal sinuses are well-aerated.Bilateral cataract extractions. Intact orbits and globes. OTHER: None. IMPRESSION: Atrophy with chronic moderate small vessel ischemic disease. No acute intracranial abnormality. Electronically Signed   By: Tollie Ethavid  Kwon M.D.   On: 11/16/2018 21:42    Pending Labs Unresulted Labs (From admission, onward)    Start     Ordered   11/16/18 2112  Culture, blood (routine x 2)  BLOOD CULTURE X 2,   STAT     11/16/18 2112   11/16/18 2052  Urine culture  ONCE - STAT,   STAT     11/16/18 2057   Signed and Held  CBC  (enoxaparin (LOVENOX)    CrCl >/= 30 ml/min)  Once,   R    Comments:  Baseline for enoxaparin therapy IF NOT ALREADY DRAWN.  Notify MD if PLT < 100 K.    Signed and Held   Signed and Held  Creatinine, serum  (enoxaparin (LOVENOX)    CrCl >/= 30 ml/min)  Once,   R    Comments:  Baseline for enoxaparin therapy IF NOT ALREADY DRAWN.    Signed and Held   Signed and Held  Creatinine, serum  (enoxaparin (LOVENOX)    CrCl >/= 30 ml/min)  Weekly,   R    Comments:  while on enoxaparin therapy    Signed and Held   Signed and Held  Comprehensive metabolic panel  Tomorrow morning,   R      Signed and Held   Signed and Held  CBC  Tomorrow morning,   R     Signed and Held          Vitals/Pain Today's Vitals   11/16/18 2145 11/16/18 2200 11/16/18 2230 11/16/18 2300  BP:  136/77 (!) 158/80 (!) 144/59  Pulse: 78  82 84  Resp: 12 15 18 18   Temp:      TempSrc:      SpO2: 99%  97% 99%  PainSc:        Isolation Precautions No active isolations  Medications Medications  cefTRIAXone (ROCEPHIN) 1 g in sodium chloride 0.9 % 100 mL IVPB (1 g Intravenous New Bag/Given 11/16/18 2331)  sodium chloride 0.9 % bolus 500 mL (0 mLs Intravenous Stopped 11/16/18 2329)    Mobility walks with device

## 2018-11-16 NOTE — H&P (Signed)
History and Physical   Gina Hodges WUJ:[id number] DOB: 1927/08/12 DOA: 11/16/2018  Referring MD/NP/PA: Dr. Pilar Plate  PCP: Patient, No Pcp Per   Outpatient Specialists: None  Patient coming from: [facility] facility  Chief Complaint: Passing out  HPI: Gina Hodges is a 85 y.o. female with medical history significant of dementia, hypertension, hyperlipidemia, atrial fibrillation, anxiety disorder, hypothyroidism who was apparently sitting down In the toilet and was noted to slump over and passed out.  1 of the workers laid her down on the floor and started CPR.  EMS were called and patient was brought in.  Patient is back to baseline at the moment.  Not sure how long she was out.  Not sure if she had any arrhythmias.  Blood sugar and blood pressure were apparently stable at the time.  Patient had a single rescue breath and then became alert.  Patient is therefore being admitted with syncopal episode of unknown cause.  ED Course: Temperature 97.7 blood pressure 140/70 pulse 79 respirate of 19 oxygen sat 94% room air.  White count is 11.8 and hemoglobin 11.1 otherwise chemistry and CBC are within normal.  Troponin is negative EKG showed no significant changes. Urinalysis showed nitrite positive many bacteria WBC more than 50 RBC 11-20 and positive leukocytes.  Head CT without contrast is negative.  Review of Systems: As per HPI otherwise 10 point review of systems negative.    Past Medical History:  Diagnosis Date  . Anemia   . Anxiety   . Cerebral infarct (HCC)   . Dementia   . Dementia with behavioral problem   . GERD (gastroesophageal reflux disease)   . Hyperlipidemia   . Osteoporosis   . PAF (paroxysmal atrial fibrillation) (HCC)   . Thyroid disease   . Ulcerative esophagitis   . UTI (urinary tract infection)   . Vertigo     Past Surgical History:  Procedure Laterality Date  . ABDOMINAL HYSTERECTOMY    . APPENDECTOMY    . CHOLECYSTECTOMY       reports that she  has never smoked. She does not have any smokeless tobacco history on file. She reports that she does not drink alcohol or use drugs.  Allergies  Allergen Reactions  . Penicillins Other (See Comments)    Per MAR, unknown reaction  . Sulfa Antibiotics Other (See Comments)    Per MAR, unknown reaction  . Codeine Palpitations    Family History  Problem Relation Age of Onset  . Depression Mother   . Bipolar disorder Daughter   . Drug abuse Son      Prior to Admission medications   Medication Sig Start Date End Date Taking? Authorizing Provider  acetaminophen (TYLENOL) 650 MG CR tablet Take 650 mg by mouth every 8 (eight) hours as needed for pain.   Yes [provider]  ALPRAZolam Prudy Feeler) 0.5 MG tablet Take 0.5 mg by mouth 2 (two) times daily.   Yes [provider]  alum & mag hydroxide-simeth (MAALOX/MYLANTA) 200-200-20 MG/5ML suspension Take 30 mLs by mouth every 6 (six) hours as needed for indigestion or heartburn.   Yes [provider]  atorvastatin (LIPITOR) 10 MG tablet Take 5 mg by mouth at bedtime.  02/27/13  Yes [provider]  calcium-vitamin D (OSCAL WITH D) 500-200 MG-UNIT tablet Take 1 tablet by mouth 2 (two) times daily.   Yes [provider]  donepezil (ARICEPT) 10 MG tablet Take 10 mg by mouth at bedtime.  03/22/13  Yes [provider]  levothyroxine (SYNTHROID, LEVOTHROID) 75 MCG tablet Take 1 tablet by mouth daily before breakfast.  02/27/13  Yes [provider]  loperamide (IMODIUM) 2 MG capsule Take 2-4 mg by mouth See admin instructions. Give 1 capsule by mouth every 2 hours as needed for diarrhea. 4mg  for first loose stool then after each loose stool 2mg . 11/08/18  Yes [provider]  loratadine (CLARITIN) 10 MG tablet Take 10 mg by mouth daily.   Yes [provider]  Melatonin 5 MG TABS Take 5 mg by mouth at bedtime.   Yes [provider]  meloxicam (MOBIC) 15 MG tablet Take 15 mg by  mouth daily. 10/30/18  Yes [provider]  mineral oil external liquid Place 2 drops into both ears once a week. Apply 2 drops to both ears once a week on Mondays for wax build-up   Yes [provider]  Multiple Vitamin (MULTIVITAMIN WITH MINERALS) TABS tablet Take 1 tablet by mouth every morning.   Yes [provider]  omeprazole (PRILOSEC) 20 MG capsule Take 20 mg by mouth 2 (two) times daily. 10/17/18  Yes [provider]  polyethylene glycol (MIRALAX / GLYCOLAX) packet Take 17 g by mouth daily as needed for mild constipation.   Yes [provider]  Skin Protectants, Misc. (EUCERIN) cream Apply 1 application topically at bedtime as needed (itching).   Yes [provider]  traMADol (ULTRAM) 50 MG tablet Take 1 tablet by mouth every 6 hours as needed for pain. Patient taking differently: Take 50 mg by mouth 2 (two) times daily.  12/18/13  Yes Oneal GroutPandey, Mahima, MD  traZODone (DESYREL) 150 MG tablet Take 75 mg by mouth at bedtime. 10/27/18  Yes [provider]  venlafaxine XR (EFFEXOR-XR) 75 MG 24 hr capsule Take 75 mg by mouth daily with breakfast.   Yes [provider]    Physical Exam: Vitals:   11/16/18 2100 11/16/18 2130 11/16/18 2145 11/16/18 2200  BP: (!) 132/57 140/70  136/77  Pulse: 76 78 78   Resp: 14 19 12 15   Temp:      TempSrc:      SpO2: 97% 95% 99%       Constitutional: NAD, calm, comfortable Vitals:   11/16/18 2100 11/16/18 2130 11/16/18 2145 11/16/18 2200  BP: (!) 132/57 140/70  136/77  Pulse: 76 78 78   Resp: 14 19 12 15   Temp:      TempSrc:      SpO2: 97% 95% 99%    Eyes: PERRL, lids and conjunctivae normal ENMT: Mucous membranes are moist. Posterior pharynx clear of any exudate or lesions.Normal dentition.  Neck: normal, supple, no masses, no thyromegaly Respiratory: clear to auscultation bilaterally, no wheezing, no crackles. Normal respiratory effort. No accessory muscle use.    Cardiovascular: Regular rate and rhythm, no murmurs / rubs / gallops. No extremity edema. 2+ pedal pulses. No carotid bruits.  Abdomen: no tenderness, no masses palpated. No hepatosplenomegaly. Bowel sounds positive.  Musculoskeletal: no clubbing / cyanosis. No joint deformity upper and lower extremities. Good ROM, no contractures. Normal muscle tone.  Skin: no rashes, lesions, ulcers. No induration Neurologic: CN 2-12 grossly intact. Sensation intact, DTR normal. Strength 5/5 in all 4.  Psychiatric: Mildly confused but awake and alert. Normal mood.     Labs on Admission: I have personally reviewed following labs and imaging studies  CBC: Recent Labs  Lab 11/16/18 2132  WBC 11.8*  NEUTROABS 8.3*  HGB 11.1*  HCT  36.9  MCV 95.1  PLT 342   Basic Metabolic Panel: Recent Labs  Lab 11/16/18 2132  NA 135  K 4.3  CL 101  CO2 27  GLUCOSE 113*  BUN 23  CREATININE 0.76  CALCIUM 9.3   GFR: CrCl cannot be calculated (Unknown ideal weight.). Liver Function Tests: No results for input(s): AST, ALT, ALKPHOS, BILITOT, PROT, ALBUMIN in the last 168 hours. No results for input(s): LIPASE, AMYLASE in the last 168 hours. No results for input(s): AMMONIA in the last 168 hours. Coagulation Profile: No results for input(s): INR, PROTIME in the last 168 hours. Cardiac Enzymes: Recent Labs  Lab 11/16/18 2132  TROPONINI <0.03   BNP (last 3 results) No results for input(s): PROBNP in the last 8760 hours. HbA1C: No results for input(s): HGBA1C in the last 72 hours. CBG: Recent Labs  Lab 11/16/18 2208  GLUCAP 98   Lipid Profile: No results for input(s): CHOL, HDL, LDLCALC, TRIG, CHOLHDL, LDLDIRECT in the last 72 hours. Thyroid Function Tests: No results for input(s): TSH, T4TOTAL, FREET4, T3FREE, THYROIDAB in the last 72 hours. Anemia Panel: No results for input(s): VITAMINB12, FOLATE, FERRITIN, TIBC, IRON, RETICCTPCT in the last 72 hours. Urine analysis:    Component Value  Date/Time   COLORURINE AMBER (A) 11/16/2018 2144   APPEARANCEUR HAZY (A) 11/16/2018 2144   LABSPEC 1.030 11/16/2018 2144   PHURINE 5.0 11/16/2018 2144   GLUCOSEU NEGATIVE 11/16/2018 2144   HGBUR NEGATIVE 11/16/2018 2144   BILIRUBINUR NEGATIVE 11/16/2018 2144   KETONESUR 5 (A) 11/16/2018 2144   PROTEINUR NEGATIVE 11/16/2018 2144   NITRITE POSITIVE (A) 11/16/2018 2144   LEUKOCYTESUR SMALL (A) 11/16/2018 2144   Sepsis Labs: @LABRCNTIP (procalcitonin:4,lacticidven:4) )No results found for this or any previous visit (from the past 240 hour(s)).   Radiological Exams on Admission: Dg Chest 1 View  Result Date: 11/16/2018 CLINICAL DATA:  Syncope. EXAM: CHEST  1 VIEW COMPARISON:  Radiographs of Feb 22, 2016. FINDINGS: Stable cardiomediastinal silhouette. No pneumothorax or pleural effusion is noted. Both lungs are clear. The visualized skeletal structures are unremarkable. IMPRESSION: No acute cardiopulmonary abnormality seen. Electronically Signed   By: Lupita Raider, M.D.   On: 11/16/2018 21:12   Ct Head Wo Contrast  Result Date: 11/16/2018 CLINICAL DATA:  Syncopal episode EXAM: CT HEAD WITHOUT CONTRAST TECHNIQUE: Contiguous axial images were obtained from the base of the skull through the vertex without intravenous contrast. COMPARISON:  12/20/2016 FINDINGS: BRAIN: There is chronic stable moderate sulcal and ventricular prominence consistent with superficial and central atrophy. No intraparenchymal hemorrhage, mass effect nor midline shift. Periventricular and subcortical white matter hypodensities consistent with chronic moderate small vessel ischemic disease are identified. No acute large vascular territory infarcts. No abnormal extra-axial fluid collections. Basal cisterns are not effaced and midline. VASCULAR: Moderate calcific atherosclerosis of the carotid siphons. SKULL: No skull fracture. No significant scalp soft tissue swelling. SINUSES/ORBITS: The mastoid air-cells are clear. The  included paranasal sinuses are well-aerated.Bilateral cataract extractions. Intact orbits and globes. OTHER: None. IMPRESSION: Atrophy with chronic moderate small vessel ischemic disease. No acute intracranial abnormality. Electronically Signed   By: Tollie Eth M.D.   On: 11/16/2018 21:42    EKG: Independently reviewed.  It showed normal sinus rhythm with a rate of 78.  Prolonged PR interval.  Assessment/Plan Principal Problem:   Syncope and collapse Active Problems:   Generalized anxiety disorder   Hyperlipidemia   Dementia with behavioral problem (HCC)   GERD (gastroesophageal reflux disease)   PAF (paroxysmal atrial  fibrillation) (HCC)   UTI (urinary tract infection)     #1 syncopal episode: Cause is not entirely clear.  It could be due to arrhythmias with her history of paroxysmal atrial fibrillation, could becardiac although no change in EKG or enzymes, vasovagal is possible, patient has UTI and could have had orthostatic hypotension but that has not been documented.  We will admit the patient work-up for neurogenic as well as cardiogenic syncope.  MRI of the brain and echocardiogram will be done.  Supportive care and monitor on telemetry.  #2 UTI: Urinalysis consistent with UTI although she is afebrile.  She has leukocytosis.  Empirically start on Rocephin and await urine and blood culture results.  #3 dementia: Appears stable close to baseline.  Continue monitoring.  #4 paroxysmal atrial fibrillation: Currently in sinus rhythm.  Not on chronic anticoagulation.  Monitor closely.  #5 hyperlipidemia: Continue with statin.  #6 GERD: Continue PPIs  #7 generalized anxiety disorder: Continue with home regimen.   DVT prophylaxis: Lovenox Code Status: Full code Family Communication: No family at bedside Disposition Plan: Back to skilled nursing facility Consults called: None Admission status: Observation  Severity of Illness: The appropriate patient status for this patient is  OBSERVATION. Observation status is judged to be reasonable and necessary in order to provide the required intensity of service to ensure the patient's safety. The patient's presenting symptoms, physical exam findings, and initial radiographic and laboratory data in the context of their medical condition is felt to place them at decreased risk for further clinical deterioration. Furthermore, it is anticipated that the patient will be medically stable for discharge from the hospital within 2 midnights of admission. The following factors support the patient status of observation.   " The patient's presenting symptoms include syncope. " The physical exam findings include mild confusion. " The initial radiographic and laboratory data are evidence of UTI.     Lonia Blood MD [facility] Pager [phone]  If 7PM-7AM, please contact night-coverage [email or web address] Password [facility]  11/16/2018, 11:07 PM

## 2018-11-16 NOTE — ED Notes (Signed)
Bed: EZ66 Expected date:  Expected time:  Means of arrival:  Comments: 62F syncope from SNF

## 2018-11-17 ENCOUNTER — Observation Stay (HOSPITAL_COMMUNITY): Payer: Medicare HMO

## 2018-11-17 ENCOUNTER — Other Ambulatory Visit: Payer: Self-pay

## 2018-11-17 ENCOUNTER — Inpatient Hospital Stay (HOSPITAL_COMMUNITY): Payer: Medicare HMO

## 2018-11-17 ENCOUNTER — Encounter (HOSPITAL_COMMUNITY): Payer: Self-pay

## 2018-11-17 DIAGNOSIS — F411 Generalized anxiety disorder: Secondary | ICD-10-CM | POA: Diagnosis present

## 2018-11-17 DIAGNOSIS — Z880 Allergy status to penicillin: Secondary | ICD-10-CM | POA: Diagnosis not present

## 2018-11-17 DIAGNOSIS — F0391 Unspecified dementia with behavioral disturbance: Secondary | ICD-10-CM

## 2018-11-17 DIAGNOSIS — Z885 Allergy status to narcotic agent status: Secondary | ICD-10-CM | POA: Diagnosis not present

## 2018-11-17 DIAGNOSIS — K219 Gastro-esophageal reflux disease without esophagitis: Secondary | ICD-10-CM

## 2018-11-17 DIAGNOSIS — R55 Syncope and collapse: Secondary | ICD-10-CM | POA: Diagnosis present

## 2018-11-17 DIAGNOSIS — B9620 Unspecified Escherichia coli [E. coli] as the cause of diseases classified elsewhere: Secondary | ICD-10-CM | POA: Diagnosis present

## 2018-11-17 DIAGNOSIS — I10 Essential (primary) hypertension: Secondary | ICD-10-CM | POA: Diagnosis present

## 2018-11-17 DIAGNOSIS — Z993 Dependence on wheelchair: Secondary | ICD-10-CM | POA: Diagnosis not present

## 2018-11-17 DIAGNOSIS — H9190 Unspecified hearing loss, unspecified ear: Secondary | ICD-10-CM | POA: Diagnosis present

## 2018-11-17 DIAGNOSIS — I480 Paroxysmal atrial fibrillation: Secondary | ICD-10-CM

## 2018-11-17 DIAGNOSIS — Z882 Allergy status to sulfonamides status: Secondary | ICD-10-CM | POA: Diagnosis not present

## 2018-11-17 DIAGNOSIS — M810 Age-related osteoporosis without current pathological fracture: Secondary | ICD-10-CM | POA: Diagnosis present

## 2018-11-17 DIAGNOSIS — I6529 Occlusion and stenosis of unspecified carotid artery: Secondary | ICD-10-CM | POA: Diagnosis present

## 2018-11-17 DIAGNOSIS — J9601 Acute respiratory failure with hypoxia: Secondary | ICD-10-CM | POA: Diagnosis present

## 2018-11-17 DIAGNOSIS — Z818 Family history of other mental and behavioral disorders: Secondary | ICD-10-CM | POA: Diagnosis not present

## 2018-11-17 DIAGNOSIS — Z8673 Personal history of transient ischemic attack (TIA), and cerebral infarction without residual deficits: Secondary | ICD-10-CM | POA: Diagnosis not present

## 2018-11-17 DIAGNOSIS — N390 Urinary tract infection, site not specified: Secondary | ICD-10-CM

## 2018-11-17 DIAGNOSIS — Z791 Long term (current) use of non-steroidal anti-inflammatories (NSAID): Secondary | ICD-10-CM | POA: Diagnosis not present

## 2018-11-17 DIAGNOSIS — E039 Hypothyroidism, unspecified: Secondary | ICD-10-CM | POA: Diagnosis present

## 2018-11-17 DIAGNOSIS — E785 Hyperlipidemia, unspecified: Secondary | ICD-10-CM | POA: Diagnosis present

## 2018-11-17 DIAGNOSIS — Z79890 Hormone replacement therapy (postmenopausal): Secondary | ICD-10-CM | POA: Diagnosis not present

## 2018-11-17 LAB — CBC
HEMATOCRIT: 35.7 % — AB (ref 36.0–46.0)
HEMOGLOBIN: 10.4 g/dL — AB (ref 12.0–15.0)
MCH: 28.7 pg (ref 26.0–34.0)
MCHC: 29.1 g/dL — ABNORMAL LOW (ref 30.0–36.0)
MCV: 98.6 fL (ref 80.0–100.0)
Platelets: 298 10*3/uL (ref 150–400)
RBC: 3.62 MIL/uL — ABNORMAL LOW (ref 3.87–5.11)
RDW: 15.5 % (ref 11.5–15.5)
WBC: 9.2 10*3/uL (ref 4.0–10.5)
nRBC: 0 % (ref 0.0–0.2)

## 2018-11-17 LAB — BLOOD CULTURE ID PANEL (REFLEXED)
Acinetobacter baumannii: NOT DETECTED
Candida albicans: NOT DETECTED
Candida glabrata: NOT DETECTED
Candida krusei: NOT DETECTED
Candida parapsilosis: NOT DETECTED
Candida tropicalis: NOT DETECTED
ENTEROCOCCUS SPECIES: NOT DETECTED
Enterobacter cloacae complex: NOT DETECTED
Enterobacteriaceae species: NOT DETECTED
Escherichia coli: NOT DETECTED
Haemophilus influenzae: NOT DETECTED
Klebsiella oxytoca: NOT DETECTED
Klebsiella pneumoniae: NOT DETECTED
Listeria monocytogenes: NOT DETECTED
METHICILLIN RESISTANCE: DETECTED — AB
NEISSERIA MENINGITIDIS: NOT DETECTED
Proteus species: NOT DETECTED
Pseudomonas aeruginosa: NOT DETECTED
STAPHYLOCOCCUS AUREUS BCID: NOT DETECTED
STREPTOCOCCUS SPECIES: NOT DETECTED
Serratia marcescens: NOT DETECTED
Staphylococcus species: DETECTED — AB
Streptococcus agalactiae: NOT DETECTED
Streptococcus pneumoniae: NOT DETECTED
Streptococcus pyogenes: NOT DETECTED

## 2018-11-17 LAB — COMPREHENSIVE METABOLIC PANEL
ALT: 11 U/L (ref 0–44)
AST: 16 U/L (ref 15–41)
Albumin: 3.2 g/dL — ABNORMAL LOW (ref 3.5–5.0)
Alkaline Phosphatase: 59 U/L (ref 38–126)
Anion gap: 8 (ref 5–15)
BUN: 17 mg/dL (ref 8–23)
CHLORIDE: 105 mmol/L (ref 98–111)
CO2: 23 mmol/L (ref 22–32)
Calcium: 8.9 mg/dL (ref 8.9–10.3)
Creatinine, Ser: 0.71 mg/dL (ref 0.44–1.00)
GFR calc Af Amer: >60 mL/min (ref >60)
GFR calc non Af Amer: >60 mL/min (ref >60)
Glucose, Bld: 102 mg/dL — ABNORMAL HIGH (ref 70–99)
POTASSIUM: 4.4 mmol/L (ref 3.5–5.1)
Sodium: 136 mmol/L (ref 135–145)
Total Bilirubin: 0.5 mg/dL (ref 0.3–1.2)
Total Protein: 5.8 g/dL — ABNORMAL LOW (ref 6.5–8.1)

## 2018-11-17 LAB — ECHOCARDIOGRAM COMPLETE
Height: 61 in
Weight: 2564.39 oz

## 2018-11-17 LAB — GLUCOSE, CAPILLARY: Glucose-Capillary: 81 mg/dL (ref 70–99)

## 2018-11-17 LAB — MRSA PCR SCREENING: MRSA by PCR: NEGATIVE

## 2018-11-17 LAB — BRAIN NATRIURETIC PEPTIDE: B Natriuretic Peptide: 43.2 pg/mL (ref 0.0–100.0)

## 2018-11-17 MED ORDER — IOPAMIDOL (ISOVUE-370) INJECTION 76%
INTRAVENOUS | Status: AC
  Filled 2018-11-17: qty 100

## 2018-11-17 MED ORDER — ONDANSETRON HCL 4 MG PO TABS: 4.0000 mg | ORAL_TABLET | Freq: Four times a day (QID) | ORAL | Status: DC | PRN

## 2018-11-17 MED ORDER — DEXTROSE-NACL 5-0.45 % IV SOLN
INTRAVENOUS | Status: DC
  Administered 2018-11-17: 01:00:00 via INTRAVENOUS

## 2018-11-17 MED ORDER — LEVOTHYROXINE SODIUM 50 MCG PO TABS
75.0000 ug | ORAL_TABLET | Freq: Every day | ORAL | Status: DC
  Administered 2018-11-17 – 2018-11-19 (×3): 75 ug via ORAL
  Filled 2018-11-17 (×3): qty 1

## 2018-11-17 MED ORDER — LOPERAMIDE HCL 2 MG PO CAPS: 2.0000 mg | ORAL_CAPSULE | ORAL | Status: DC | PRN

## 2018-11-17 MED ORDER — SODIUM CHLORIDE 0.9% FLUSH
3.0000 mL | Freq: Two times a day (BID) | INTRAVENOUS | Status: DC
  Administered 2018-11-17 – 2018-11-19 (×4): 3 mL via INTRAVENOUS

## 2018-11-17 MED ORDER — TRAZODONE HCL 50 MG PO TABS
75.0000 mg | ORAL_TABLET | Freq: Every day | ORAL | Status: DC
  Administered 2018-11-17 – 2018-11-18 (×2): 75 mg via ORAL
  Filled 2018-11-17 (×2): qty 2

## 2018-11-17 MED ORDER — ALPRAZOLAM 0.5 MG PO TABS
0.5000 mg | ORAL_TABLET | Freq: Two times a day (BID) | ORAL | Status: DC
  Administered 2018-11-17 – 2018-11-19 (×5): 0.5 mg via ORAL
  Filled 2018-11-17 (×5): qty 1

## 2018-11-17 MED ORDER — ALUM & MAG HYDROXIDE-SIMETH 200-200-20 MG/5ML PO SUSP: 30.0000 mL | Freq: Four times a day (QID) | ORAL | Status: DC | PRN

## 2018-11-17 MED ORDER — HYDROCERIN EX CREA
1.0000 "application " | TOPICAL_CREAM | Freq: Every evening | CUTANEOUS | Status: DC | PRN
  Filled 2018-11-17: qty 113

## 2018-11-17 MED ORDER — SODIUM CHLORIDE (PF) 0.9 % IJ SOLN
INTRAMUSCULAR | Status: AC
  Filled 2018-11-17: qty 50

## 2018-11-17 MED ORDER — MELATONIN 5 MG PO TABS
5.0000 mg | ORAL_TABLET | Freq: Every day | ORAL | Status: DC
  Administered 2018-11-17 – 2018-11-18 (×2): 5 mg via ORAL
  Filled 2018-11-17 (×3): qty 1

## 2018-11-17 MED ORDER — DONEPEZIL HCL 10 MG PO TABS
10.0000 mg | ORAL_TABLET | Freq: Every day | ORAL | Status: DC
  Administered 2018-11-17 – 2018-11-18 (×2): 10 mg via ORAL
  Filled 2018-11-17 (×2): qty 1

## 2018-11-17 MED ORDER — VENLAFAXINE HCL ER 75 MG PO CP24
75.0000 mg | ORAL_CAPSULE | Freq: Every day | ORAL | Status: DC
  Administered 2018-11-17 – 2018-11-19 (×3): 75 mg via ORAL
  Filled 2018-11-17 (×3): qty 1

## 2018-11-17 MED ORDER — CALCIUM CARBONATE-VITAMIN D 500-200 MG-UNIT PO TABS
1.0000 | ORAL_TABLET | Freq: Two times a day (BID) | ORAL | Status: DC
  Administered 2018-11-17 – 2018-11-19 (×5): 1 via ORAL
  Filled 2018-11-17 (×5): qty 1

## 2018-11-17 MED ORDER — ADULT MULTIVITAMIN W/MINERALS CH
1.0000 | ORAL_TABLET | Freq: Every morning | ORAL | Status: DC
  Administered 2018-11-17 – 2018-11-19 (×3): 1 via ORAL
  Filled 2018-11-17 (×3): qty 1

## 2018-11-17 MED ORDER — PANTOPRAZOLE SODIUM 40 MG PO TBEC
40.0000 mg | DELAYED_RELEASE_TABLET | Freq: Every day | ORAL | Status: DC
  Administered 2018-11-17 – 2018-11-19 (×3): 40 mg via ORAL
  Filled 2018-11-17 (×3): qty 1

## 2018-11-17 MED ORDER — ONDANSETRON HCL 4 MG/2ML IJ SOLN
4.0000 mg | Freq: Four times a day (QID) | INTRAMUSCULAR | Status: DC | PRN
  Administered 2018-11-17: 4 mg via INTRAVENOUS
  Filled 2018-11-17: qty 2

## 2018-11-17 MED ORDER — HYDRALAZINE HCL 20 MG/ML IJ SOLN
10.0000 mg | Freq: Four times a day (QID) | INTRAMUSCULAR | Status: DC | PRN
  Administered 2018-11-17: 10 mg via INTRAVENOUS
  Filled 2018-11-17: qty 1

## 2018-11-17 MED ORDER — ENOXAPARIN SODIUM 40 MG/0.4ML ~~LOC~~ SOLN
40.0000 mg | Freq: Every day | SUBCUTANEOUS | Status: DC
  Administered 2018-11-17 – 2018-11-18 (×2): 40 mg via SUBCUTANEOUS
  Filled 2018-11-17 (×2): qty 0.4

## 2018-11-17 MED ORDER — MELOXICAM 15 MG PO TABS
15.0000 mg | ORAL_TABLET | Freq: Every day | ORAL | Status: DC
  Administered 2018-11-17 – 2018-11-19 (×3): 15 mg via ORAL
  Filled 2018-11-17 (×3): qty 1

## 2018-11-17 MED ORDER — LORATADINE 10 MG PO TABS
10.0000 mg | ORAL_TABLET | Freq: Every day | ORAL | Status: DC
  Administered 2018-11-17 – 2018-11-19 (×3): 10 mg via ORAL
  Filled 2018-11-17 (×3): qty 1

## 2018-11-17 MED ORDER — IOPAMIDOL (ISOVUE-370) INJECTION 76%
100.0000 mL | Freq: Once | INTRAVENOUS | Status: AC | PRN
  Administered 2018-11-17: 100 mL via INTRAVENOUS

## 2018-11-17 MED ORDER — POLYETHYLENE GLYCOL 3350 17 G PO PACK: 17.0000 g | PACK | Freq: Every day | ORAL | Status: DC | PRN

## 2018-11-17 MED ORDER — MINERAL OIL PO OIL: 1.0000 mL | TOPICAL_OIL | ORAL | Status: DC

## 2018-11-17 MED ORDER — SODIUM CHLORIDE 0.9 % IV SOLN
1.0000 g | INTRAVENOUS | Status: DC
  Administered 2018-11-17 – 2018-11-19 (×3): 1 g via INTRAVENOUS
  Filled 2018-11-17 (×3): qty 1

## 2018-11-17 MED ORDER — ACETAMINOPHEN 325 MG PO TABS: 650.0000 mg | ORAL_TABLET | Freq: Three times a day (TID) | ORAL | Status: DC | PRN

## 2018-11-17 MED ORDER — ATORVASTATIN CALCIUM 10 MG PO TABS
5.0000 mg | ORAL_TABLET | Freq: Every day | ORAL | Status: DC
  Administered 2018-11-17 – 2018-11-18 (×2): 5 mg via ORAL
  Filled 2018-11-17 (×2): qty 1

## 2018-11-17 MED ORDER — TRAMADOL HCL 50 MG PO TABS
50.0000 mg | ORAL_TABLET | Freq: Two times a day (BID) | ORAL | Status: DC
  Administered 2018-11-17 – 2018-11-19 (×5): 50 mg via ORAL
  Filled 2018-11-17 (×5): qty 1

## 2018-11-17 NOTE — Progress Notes (Signed)
PROGRESS NOTE  Gina Hodges ZOX:[id number] DOB: [birth year] DOA: 11/16/2018 PCP: Patient, No Pcp Per   LOS: 0 days   Brief narrative: Gina Hodges is a 85 y.o. female with medical history significant of dementia, hypertension, hyperlipidemia, atrial fibrillation, anxiety disorder, hypothyroidism who was apparently sitting down In the toilet and was noted to slump over and passed out.  1 of the workers laid her down on the floor and started CPR.  EMS were called and patient was brought in.  Patient is back to baseline at the moment.  Not sure how long she was out.  Not sure if she had any arrhythmias.  Blood sugar and blood pressure were apparently stable at the time.  Patient had a single rescue breath and then became alert.  Patient is therefore being admitted with syncopal episode of unknown cause.  ED Course: Temperature 97.7 blood pressure 140/70 pulse 79 respirate of 19 oxygen sat 94% room air.  White count is 11.8 and hemoglobin 11.1 otherwise chemistry and CBC are within normal.  Troponin is negative EKG showed no significant changes. Urinalysis showed nitrite positive many bacteria WBC more than 50 RBC 11-20 and positive leukocytes.  Head CT without contrast is negative.  Assessment/Plan:  Principal Problem:   Syncope and collapse Active Problems:   Generalized anxiety disorder   Hyperlipidemia   Dementia with behavioral problem (HCC)   GERD (gastroesophageal reflux disease)   PAF (paroxysmal atrial fibrillation) (HCC)   UTI (urinary tract infection)  Syncopal episode: No significant arrhythmias noted overnight.  Patient does have history of paroxysmal atrial fibrillation.  Possibility of vasovagal syncope.  Check 2D echocardiogram.  Hypoxia of unclear etiology.  Patient is currently on 3 L of nasal cannula.  X-rays shows no consolidation or overt pulmonary edema.  In the context of syncope, will consider ruling out pulmonary embolism.  Will order CT angiogram of the chest.  Patient is  mostly wheelchair-bound.   UTI: Patient has been empirically started on Rocephin for presumptive UTI.  Follow urine cultures.  Leukocytosis has improved.  History of dementia: Appears stable close to baseline.  Continue supportive care  Paroxysmal atrial fibrillation: Currently in sinus rhythm.  Not on chronic anticoagulation.  Monitor closely.  Hyperlipidemia: Continue with statin.  GERD: Continue PPIs  Generalized anxiety disorder: Continue with home regimen  VTE Prophylaxis: Lovenox  Code Status: Full code  Family Communication: No one is available at bedside  Disposition Plan: Skilled nursing facility likely in 1 to 2 days.  Change the patient's status to inpatient at this time due to need for further evaluation, including new oxygen requirement.   Consultants:  None  Procedures:  None  Antibiotics: Anti-infectives (From admission, onward)   Start     Dose/Rate Route Frequency Ordered Stop   11/17/18 2200  cefTRIAXone (ROCEPHIN) 1 g in sodium chloride 0.9 % 100 mL IVPB     1 g 200 mL/hr over 30 Minutes Intravenous Every 24 hours 11/17/18 0040     11/16/18 2230  cefTRIAXone (ROCEPHIN) 1 g in sodium chloride 0.9 % 100 mL IVPB     1 g 200 mL/hr over 30 Minutes Intravenous  Once 11/16/18 2222 11/17/18 0001       Subjective: Denies any dizziness lightheadedness, complain of shortness of breath and required supplemental oxygen.  Objective: Vitals:   11/17/18 1128 11/17/18 1159  BP:  (!) 122/100  Pulse:  80  Resp:  20  Temp:  98.6 F (37 C)  SpO2: 91% 94%  Intake/Output Summary (Last 24 hours) at 11/17/2018 1325 Last data filed at 11/17/2018 1128 Gross per 24 hour  Intake 900 ml  Output 300 ml  Net 600 ml   Filed Weights   11/17/18 0108 11/17/18 0510  Weight: 72.4 kg 72.7 kg   Body mass index is 30.28 kg/m.   Physical Exam: GENERAL: Patient is alert awake and communicative, on nasal cannula oxygen, not in obvious distress. HENT: No scleral  pallor or icterus. Pupils equally reactive to light. Oral mucosa is moist NECK: is supple, no palpable thyroid enlargement. CHEST:  Diminished breath sounds bilaterally.  No definite wheezes noted CVS: S1 and S2 heard, no murmur. Regular rate and rhythm. No pericardial rub. ABDOMEN: Soft, non-tender, bowel sounds are present. No palpable hepato-splenomegaly. EXTREMITIES: No edema. CNS: Cranial nerves are intact. No focal motor or sensory deficits.  Mildly confused.  Alert awake communicative. SKIN: warm and dry without rashes.  Data Review: I have personally reviewed the following laboratory data and studies,  CBC: Recent Labs  Lab 11/16/18 2132 11/17/18 0351  WBC 11.8* 9.2  NEUTROABS 8.3*  --   HGB 11.1* 10.4*  HCT 36.9 35.7*  MCV 95.1 98.6  PLT 342 298   Basic Metabolic Panel: Recent Labs  Lab 11/16/18 2132 11/17/18 0351  NA 135 136  K 4.3 4.4  CL 101 105  CO2 27 23  GLUCOSE 113* 102*  BUN 23 17  CREATININE 0.76 0.71  CALCIUM 9.3 8.9   Liver Function Tests: Recent Labs  Lab 11/17/18 0351  AST 16  ALT 11  ALKPHOS 59  BILITOT 0.5  PROT 5.8*  ALBUMIN 3.2*   No results for input(s): LIPASE, AMYLASE in the last 168 hours. No results for input(s): AMMONIA in the last 168 hours. Cardiac Enzymes: Recent Labs  Lab 11/16/18 2132  TROPONINI <0.03   BNP (last 3 results) No results for input(s): BNP in the last 8760 hours.  ProBNP (last 3 results) No results for input(s): PROBNP in the last 8760 hours.  CBG: Recent Labs  Lab 11/16/18 2208 11/17/18 0734  GLUCAP 98 81   Recent Results (from the past 240 hour(s))  Culture, blood (routine x 2)     Status: None (Preliminary result)   Collection Time: 11/16/18  9:32 PM  Result Value Ref Range Status   Specimen Description   Final    BLOOD LEFT ANTECUBITAL Performed at [facility], [address], [place], [place] [address]    Special Requests   Final    BOTTLES DRAWN AEROBIC AND  ANAEROBIC Blood Culture adequate volume Performed at [facility], [address], [place], [place] [address]    Culture   Final    NO GROWTH < 12 HOURS Performed at [facility], [address] [place], [place] [address]    Report Status PENDING  Incomplete  Culture, blood (routine x 2)     Status: None (Preliminary result)   Collection Time: 11/16/18  9:44 PM  Result Value Ref Range Status   Specimen Description   Final    BLOOD RIGHT HAND Performed at [facility], [address], [place], [place] [address]    Special Requests   Final    BOTTLES DRAWN AEROBIC ONLY Blood Culture adequate volume Performed at [facility], [address], [place], [place] [address]    Culture   Final    NO GROWTH < 12 HOURS Performed at [facility],  [address] [place], [place] [address]    Report Status PENDING  Incomplete  MRSA PCR Screening     Status: None   Collection Time: 11/17/18  5:45 AM  Result Value Ref Range Status   MRSA by PCR NEGATIVE NEGATIVE Final    Comment:        The GeneXpert MRSA Assay (FDA approved for NASAL specimens only), is one component of a comprehensive MRSA colonization surveillance program. It is not intended to diagnose MRSA infection nor to guide or monitor treatment for MRSA infections. Performed at [facility], [address] [place], [place] [address]      Studies: Dg Chest 1 View  Result Date: 11/17/2018 CLINICAL DATA:  Dyspnea. EXAM: CHEST  1 VIEW COMPARISON:  Radiograph of November 16, 2018. FINDINGS: Stable cardiomediastinal silhouette. No pneumothorax or pleural effusion is noted. No acute pulmonary disease is noted. Bony thorax is unremarkable. IMPRESSION: No acute cardiopulmonary abnormality seen. Electronically Signed   By: Lupita Raider, M.D.   On: 11/17/2018 12:50   Dg Chest 1 View  Result Date: 11/16/2018 CLINICAL DATA:  Syncope. EXAM: CHEST   1 VIEW COMPARISON:  Radiographs of Feb 22, 2016. FINDINGS: Stable cardiomediastinal silhouette. No pneumothorax or pleural effusion is noted. Both lungs are clear. The visualized skeletal structures are unremarkable. IMPRESSION: No acute cardiopulmonary abnormality seen. Electronically Signed   By: Lupita Raider, M.D.   On: 11/16/2018 21:12   Ct Head Wo Contrast  Result Date: 11/16/2018 CLINICAL DATA:  Syncopal episode EXAM: CT HEAD WITHOUT CONTRAST TECHNIQUE: Contiguous axial images were obtained from the base of the skull through the vertex without intravenous contrast. COMPARISON:  12/20/2016 FINDINGS: BRAIN: There is chronic stable moderate sulcal and ventricular prominence consistent with superficial and central atrophy. No intraparenchymal hemorrhage, mass effect nor midline shift. Periventricular and subcortical white matter hypodensities consistent with chronic moderate small vessel ischemic disease are identified. No acute large vascular territory infarcts. No abnormal extra-axial fluid collections. Basal cisterns are not effaced and midline. VASCULAR: Moderate calcific atherosclerosis of the carotid siphons. SKULL: No skull fracture. No significant scalp soft tissue swelling. SINUSES/ORBITS: The mastoid air-cells are clear. The included paranasal sinuses are well-aerated.Bilateral cataract extractions. Intact orbits and globes. OTHER: None. IMPRESSION: Atrophy with chronic moderate small vessel ischemic disease. No acute intracranial abnormality. Electronically Signed   By: Tollie Eth M.D.   On: 11/16/2018 21:42   Mr Brain Wo Contrast  Result Date: 11/17/2018 CLINICAL DATA:  Syncope and fainting.  Patient became unresponsive. The examination had to be discontinued prior to completion due to patient unable to tolerate imaging. Patient attempted to exit the scanner. EXAM: MRI HEAD WITHOUT CONTRAST TECHNIQUE: Multiplanar, multiecho pulse sequences of the brain and surrounding structures were  obtained without intravenous contrast. COMPARISON:  CT head without contrast 11/16/2018 FINDINGS: Brain: Only the axial diffusion weighted imaging was completed. The diffusion sequence is moderately degraded by patient motion. No definite restricted diffusion is present. There is no significant susceptibility to suggest hemorrhage. Advanced atrophy and white matter disease is present. No extra-axial or intraventricular hemorrhage is present. Vascular: Not assessed Skull and upper cervical spine: Not assessed Sinuses/Orbits: Not assessed IMPRESSION: 1. No acute infarct. 2. Limited study with only diffusion-weighted imaging. 3. Advanced atrophy and white matter disease likely reflects the sequela of chronic microvascular ischemia. 4. Other aspects of the brain and head can not be assessed due to the lack of additional sequences. Electronically Signed   By: Marin Roberts  M.D.   On: 11/17/2018 07:00    Scheduled Meds: . ALPRAZolam  0.5 mg Oral BID  . atorvastatin  5 mg Oral QHS  . calcium-vitamin D  1 tablet Oral BID  . donepezil  10 mg Oral QHS  . enoxaparin (LOVENOX) injection  40 mg Subcutaneous QHS  . levothyroxine  75 mcg Oral Q0600  . loratadine  10 mg Oral Daily  . Melatonin  5 mg Oral QHS  . meloxicam  15 mg Oral Daily  . [START ON 11/20/2018] mineral oil  1 mL OTIC (EAR) Weekly  . multivitamin with minerals  1 tablet Oral q morning - 10a  . pantoprazole  40 mg Oral Daily  . sodium chloride flush  3 mL Intravenous Q12H  . traMADol  50 mg Oral BID  . traZODone  75 mg Oral QHS  . venlafaxine XR  75 mg Oral Q breakfast    Continuous Infusions: . cefTRIAXone (ROCEPHIN)  IV       Joycelyn DasLaxman Bonney Berres, MD  [facility] 11/17/2018

## 2018-11-17 NOTE — Progress Notes (Signed)
PHARMACY - PHYSICIAN COMMUNICATION CRITICAL VALUE ALERT - BLOOD CULTURE IDENTIFICATION (BCID)  Gina Hodges is an 85 y.o. female who presented to [facility] on 11/16/2018 with a chief complaint of syncope and collapsed while on toilet.    Assessment:  Currently on Rocephin for UTI on admission.  Labs have improved.   Name of physician (or Provider) Contacted: Pokhrel  Current antibiotics: Rocephin 1gm IV q24h  Changes to prescribed antibiotics recommended:  Likely contaminant.  Continue current antibiotic regimen and clinical course.   Results for orders placed or performed during the hospital encounter of 11/16/18  Blood Culture ID Panel (Reflexed) (Collected: 11/16/2018  9:32 PM)  Result Value Ref Range   Enterococcus species NOT DETECTED NOT DETECTED   Listeria monocytogenes NOT DETECTED NOT DETECTED   Staphylococcus species DETECTED (A) NOT DETECTED   Staphylococcus aureus (BCID) NOT DETECTED NOT DETECTED   Methicillin resistance DETECTED (A) NOT DETECTED   Streptococcus species NOT DETECTED NOT DETECTED   Streptococcus agalactiae NOT DETECTED NOT DETECTED   Streptococcus pneumoniae NOT DETECTED NOT DETECTED   Streptococcus pyogenes NOT DETECTED NOT DETECTED   Acinetobacter baumannii NOT DETECTED NOT DETECTED   Enterobacteriaceae species NOT DETECTED NOT DETECTED   Enterobacter cloacae complex NOT DETECTED NOT DETECTED   Escherichia coli NOT DETECTED NOT DETECTED   Klebsiella oxytoca NOT DETECTED NOT DETECTED   Klebsiella pneumoniae NOT DETECTED NOT DETECTED   Proteus species NOT DETECTED NOT DETECTED   Serratia marcescens NOT DETECTED NOT DETECTED   Haemophilus influenzae NOT DETECTED NOT DETECTED   Neisseria meningitidis NOT DETECTED NOT DETECTED   Pseudomonas aeruginosa NOT DETECTED NOT DETECTED   Candida albicans NOT DETECTED NOT DETECTED   Candida glabrata NOT DETECTED NOT DETECTED   Candida krusei NOT DETECTED NOT DETECTED   Candida parapsilosis NOT DETECTED NOT  DETECTED   Candida tropicalis NOT DETECTED NOT DETECTED    Elson Clan 11/17/2018  4:44 PM

## 2018-11-18 LAB — GLUCOSE, CAPILLARY
Glucose-Capillary: 68 mg/dL — ABNORMAL LOW (ref 70–99)
Glucose-Capillary: 110 mg/dL — ABNORMAL HIGH (ref 70–99)

## 2018-11-18 NOTE — Plan of Care (Signed)
  Problem: Clinical Measurements: Goal: Ability to maintain clinical measurements within normal limits will improve Outcome: Progressing Goal: Will remain free from infection Outcome: Progressing Goal: Diagnostic test results will improve Outcome: Progressing Goal: Respiratory complications will improve Outcome: Progressing Goal: Cardiovascular complication will be avoided Outcome: Progressing   Problem: Nutrition: Goal: Adequate nutrition will be maintained Outcome: Progressing   Problem: Coping: Goal: Level of anxiety will decrease Outcome: Progressing   Problem: Elimination: Goal: Will not experience complications related to urinary retention Outcome: Progressing   Problem: Pain Managment: Goal: General experience of comfort will improve Outcome: Progressing   Problem: Safety: Goal: Ability to remain free from injury will improve Outcome: Progressing   Problem: Skin Integrity: Goal: Risk for impaired skin integrity will decrease Outcome: Progressing   Problem: Urinary Elimination: Goal: Signs and symptoms of infection will decrease Outcome: Progressing

## 2018-11-18 NOTE — Progress Notes (Signed)
Decreased urine output. MD notified, new orders received.  Earnest Conroy. Clelia Croft, RN

## 2018-11-18 NOTE — Progress Notes (Signed)
PROGRESS NOTE  Gina Hodges ZOX:[id number] DOB: Jan 04, 1927 DOA: 11/16/2018 PCP: Patient, No Pcp Per   LOS: 1 day   Brief narrative: Gina Hodges is a 85 y.o. female with medical history significant of dementia, hypertension, hyperlipidemia, atrial fibrillation, anxiety disorder, hypothyroidism presented to the hospital for evaluation of syncope.  Patient was apparently sitting down In the toilet and was noted to slump over and passed out.  1 of the workers laid her down on the floor and started CPR.  EMS were called and patient was brought in.   Blood sugar and blood pressure were apparently stable at the time.  Patient had a single rescue breath and then became alert.  Patient was therefore being admitted with syncopal episode of unknown cause.  In the ED, Temperature 97.7 blood pressure 140/70 pulse 79 respirate of 19 oxygen sat 94% room air.  White count was 11.8 and hemoglobin 11.1 otherwise chemistry and CBC are within normal.  Troponin  negative EKG showed no significant changes. Urinalysis showed nitrite positive many bacteria WBC more than 50 RBC 11-20 and positive leukocytes.  Head CT without contrast was negative.  Patient was then admitted to the hospital for further evaluation and treatment.  Assessment/Plan:  Principal Problem:   Syncope and collapse Active Problems:   Generalized anxiety disorder   Hyperlipidemia   Dementia with behavioral problem (HCC)   GERD (gastroesophageal reflux disease)   PAF (paroxysmal atrial fibrillation) (HCC)   UTI (urinary tract infection)   Syncope  Syncopal episode: No significant arrhythmias noted.  Patient does have history of paroxysmal atrial fibrillation.  Possibility of vasovagal syncope.  2D echocardiogram showed left ventricular ejection fraction of 60 to 65%.  There was impaired diastolic relaxation.  CT angiogram of the chest did not show any central pulmonary emboli though the scan was limited.  MRI of the brain did not show any evidence of  acute infarct.  Positive blood culture x1 bottle.  Likely to be a contaminant.  We will continue with Rocephin for now.  Hypoxia of unclear etiology.  Patient was initially put on 3 L of oxygen by nasal cannula.  X-ray did not show any evidence of pulmonary edema.  CT scan of the chest did not show any obvious pulmonary embolism.  We will try to wean off the oxygen today.  Continue spirometry.  E. coli UTI: Patient has been empirically started on Rocephin. Follow sensitivity.  Leukocytosis has improved.  History of dementia: Baseline continue supportive care  Paroxysmal atrial fibrillation: Currently in sinus rhythm.  Not on chronic anticoagulation.  Monitor closely.  Hyperlipidemia: Continue with statin.  GERD: Continue PPIs  Generalized anxiety disorder: Continue with home regimen  VTE Prophylaxis: Lovenox  Code Status: Full code  Family Communication: No one is available at bedside  Disposition Plan: Skilled nursing facility likely tomorrow if patient remains stable.  Wean oxygen  Consultants:  None  Procedures:  None  Antibiotics: Anti-infectives (From admission, onward)   Start     Dose/Rate Route Frequency Ordered Stop   11/17/18 2200  cefTRIAXone (ROCEPHIN) 1 g in sodium chloride 0.9 % 100 mL IVPB     1 g 200 mL/hr over 30 Minutes Intravenous Every 24 hours 11/17/18 0040     11/16/18 2230  cefTRIAXone (ROCEPHIN) 1 g in sodium chloride 0.9 % 100 mL IVPB     1 g 200 mL/hr over 30 Minutes Intravenous  Once 11/16/18 2222 11/17/18 0001     Subjective: She denies any dizziness, lightheadedness.  On supplemental oxygen.  Objective: Vitals:   11/18/18 1022 11/18/18 1122  BP:    Pulse:    Resp:    Temp:    SpO2: 99% 97%    Intake/Output Summary (Last 24 hours) at 11/18/2018 1430 Last data filed at 11/18/2018 1420 Gross per 24 hour  Intake 400 ml  Output 650 ml  Net -250 ml   Filed Weights   11/17/18 0108 11/17/18 0510 11/18/18 0532  Weight: 72.4 kg  72.7 kg 73 kg   Body mass index is 30.41 kg/m.   Physical Exam: General:  Average built, not in obvious distress, on nasal cannula HENT: Normocephalic, pupils equally reacting to light and accommodation.  No scleral pallor or icterus noted. Oral mucosa is moist.  Chest: Diminished breath sounds bilaterally.  No definite wheezes noted CVS: S1 &S2 heard. No murmur.  Regular rate and rhythm. Abdomen: Soft, nontender, nondistended.  Bowel sounds are heard.  Liver is not palpable, no abdominal mass palpated Extremities: No cyanosis, clubbing or edema.  Peripheral pulses are palpable. Psych: Alert, awake and communicative, normal mood CNS:  No cranial nerve deficits.  Power equal in all extremities.  No sensory deficits noted.  No cerebellar signs.   Skin: Warm and dry.  No rashes noted.   Data Review: I have personally reviewed the following laboratory data and studies,  CBC: Recent Labs  Lab 11/16/18 2132 11/17/18 0351  WBC 11.8* 9.2  NEUTROABS 8.3*  --   HGB 11.1* 10.4*  HCT 36.9 35.7*  MCV 95.1 98.6  PLT 342 298   Basic Metabolic Panel: Recent Labs  Lab 11/16/18 2132 11/17/18 0351  NA 135 136  K 4.3 4.4  CL 101 105  CO2 27 23  GLUCOSE 113* 102*  BUN 23 17  CREATININE 0.76 0.71  CALCIUM 9.3 8.9   Liver Function Tests: Recent Labs  Lab 11/17/18 0351  AST 16  ALT 11  ALKPHOS 59  BILITOT 0.5  PROT 5.8*  ALBUMIN 3.2*   No results for input(s): LIPASE, AMYLASE in the last 168 hours. No results for input(s): AMMONIA in the last 168 hours. Cardiac Enzymes: Recent Labs  Lab 11/16/18 2132  TROPONINI <0.03   BNP (last 3 results) Recent Labs    11/17/18 0351  BNP 43.2    ProBNP (last 3 results) No results for input(s): PROBNP in the last 8760 hours.  CBG: Recent Labs  Lab 11/16/18 2208 11/17/18 0734 11/18/18 0756 11/18/18 0839  GLUCAP 98 81 68* 110*   Recent Results (from the past 240 hour(s))  Culture, blood (routine x 2)     Status: Abnormal  (Preliminary result)   Collection Time: 11/16/18  9:32 PM  Result Value Ref Range Status   Specimen Description   Final    BLOOD LEFT ANTECUBITAL Performed at [facility], [address], [place], [place] [address]    Special Requests   Final    BOTTLES DRAWN AEROBIC AND ANAEROBIC Blood Culture adequate volume Performed at [facility], [address] [place], [place] [address]    Culture  Setup Time   Final    GRAM POSITIVE COCCI IN CLUSTERS IN BOTH AEROBIC AND ANAEROBIC BOTTLES CRITICAL RESULT CALLED TO, READ BACK BY AND VERIFIED WITH: Damaris Hippo PharmD 16:35 11/17/18 ([email or web address])    Culture (A)  Final    STAPHYLOCOCCUS SPECIES (COAGULASE NEGATIVE) THE SIGNIFICANCE OF ISOLATING THIS ORGANISM FROM A SINGLE SET OF BLOOD CULTURES WHEN MULTIPLE SETS ARE DRAWN IS UNCERTAIN.  PLEASE NOTIFY THE MICROBIOLOGY DEPARTMENT WITHIN ONE WEEK IF SPECIATION AND SENSITIVITIES ARE REQUIRED. Performed at [facility], [address], [place], [place] [address]    Report Status PENDING  Incomplete  Blood Culture ID Panel (Reflexed)     Status: Abnormal   Collection Time: 11/16/18  9:32 PM  Result Value Ref Range Status   Enterococcus species NOT DETECTED NOT DETECTED Final   Listeria monocytogenes NOT DETECTED NOT DETECTED Final   Staphylococcus species DETECTED (A) NOT DETECTED Final    Comment: Methicillin (oxacillin) resistant coagulase negative staphylococcus. Possible blood culture contaminant (unless isolated from more than one blood culture draw or clinical case suggests pathogenicity). No antibiotic treatment is indicated for blood  culture contaminants. CRITICAL RESULT CALLED TO, READ BACK BY AND VERIFIED WITH: Damaris Hippo PharmD 16:35 11/17/18 ([email or web address])    Staphylococcus aureus (BCID) NOT DETECTED NOT DETECTED Final   Methicillin resistance DETECTED (A) NOT DETECTED Final    Comment: CRITICAL RESULT CALLED TO, READ BACK BY AND VERIFIED WITH: Damaris Hippo PharmD 16:35 11/17/18 ([email or web address])    Streptococcus species NOT DETECTED NOT DETECTED Final   Streptococcus agalactiae NOT DETECTED NOT DETECTED Final   Streptococcus pneumoniae NOT DETECTED NOT DETECTED Final   Streptococcus pyogenes NOT DETECTED NOT DETECTED Final   Acinetobacter baumannii NOT DETECTED NOT DETECTED Final   Enterobacteriaceae species NOT DETECTED NOT DETECTED Final   Enterobacter cloacae complex NOT DETECTED NOT DETECTED Final   Escherichia coli NOT DETECTED NOT DETECTED Final   Klebsiella oxytoca NOT DETECTED NOT DETECTED Final   Klebsiella pneumoniae NOT DETECTED NOT DETECTED Final   Proteus species NOT DETECTED NOT DETECTED Final   Serratia marcescens NOT DETECTED NOT DETECTED Final   Haemophilus influenzae NOT DETECTED NOT DETECTED Final   Neisseria meningitidis NOT DETECTED NOT DETECTED Final   Pseudomonas aeruginosa NOT DETECTED NOT DETECTED Final   Candida albicans NOT DETECTED NOT DETECTED Final   Candida glabrata NOT DETECTED NOT DETECTED Final   Candida krusei NOT DETECTED NOT DETECTED Final   Candida parapsilosis NOT DETECTED NOT DETECTED Final   Candida tropicalis NOT DETECTED NOT DETECTED Final    Comment: Performed at [facility], [address] [place], [place] [address]  Urine culture     Status: Abnormal (Preliminary result)   Collection Time: 11/16/18  9:44 PM  Result Value Ref Range Status   Specimen Description   Final    URINE, CATHETERIZED Performed at [facility], [address], [place], [place] [address]    Special Requests   Final    NONE Performed at [facility], [address], [place], [place] [address]    Culture >=100,000 COLONIES/mL ESCHERICHIA COLI (A)  Final   Report Status PENDING  Incomplete  Culture, blood (routine x 2)     Status: None (Preliminary result)   Collection Time: 11/16/18  9:44 PM  Result Value Ref Range Status   Specimen Description   Final    BLOOD RIGHT  HAND Performed at [facility], [address], [place], [place] [address]    Special Requests   Final    BOTTLES DRAWN AEROBIC ONLY Blood Culture adequate volume Performed at [facility], [address] [place], [place] [address]    Culture   Final    NO GROWTH 2 DAYS Performed at [facility], [address], [place], [place] [address]    Report Status PENDING  Incomplete  MRSA PCR  Screening     Status: None   Collection Time: 11/17/18  5:45 AM  Result Value Ref Range Status   MRSA by PCR NEGATIVE NEGATIVE Final    Comment:        The GeneXpert MRSA Assay (FDA approved for NASAL specimens only), is one component of a comprehensive MRSA colonization surveillance program. It is not intended to diagnose MRSA infection nor to guide or monitor treatment for MRSA infections. Performed at [facility], [address] [place], [place] [id number]      Studies: Dg Chest 1 View  Result Date: 11/17/2018 CLINICAL DATA:  Dyspnea. EXAM: CHEST  1 VIEW COMPARISON:  Radiograph of November 16, 2018. FINDINGS: Stable cardiomediastinal silhouette. No pneumothorax or pleural effusion is noted. No acute pulmonary disease is noted. Bony thorax is unremarkable. IMPRESSION: No acute cardiopulmonary abnormality seen. Electronically Signed   By: Lupita RaiderJames  Green Jr, M.D.   On: 11/17/2018 12:50   Dg Chest 1 View  Result Date: 11/16/2018 CLINICAL DATA:  Syncope. EXAM: CHEST  1 VIEW COMPARISON:  Radiographs of Feb 22, 2016. FINDINGS: Stable cardiomediastinal silhouette. No pneumothorax or pleural effusion is noted. Both lungs are clear. The visualized skeletal structures are unremarkable. IMPRESSION: No acute cardiopulmonary abnormality seen. Electronically Signed   By: Lupita RaiderJames  Green Jr, M.D.   On: 11/16/2018 21:12   Ct Head Wo Contrast  Result Date: 11/16/2018 CLINICAL DATA:  Syncopal episode EXAM: CT HEAD WITHOUT CONTRAST TECHNIQUE: Contiguous  axial images were obtained from the base of the skull through the vertex without intravenous contrast. COMPARISON:  12/20/2016 FINDINGS: BRAIN: There is chronic stable moderate sulcal and ventricular prominence consistent with superficial and central atrophy. No intraparenchymal hemorrhage, mass effect nor midline shift. Periventricular and subcortical white matter hypodensities consistent with chronic moderate small vessel ischemic disease are identified. No acute large vascular territory infarcts. No abnormal extra-axial fluid collections. Basal cisterns are not effaced and midline. VASCULAR: Moderate calcific atherosclerosis of the carotid siphons. SKULL: No skull fracture. No significant scalp soft tissue swelling. SINUSES/ORBITS: The mastoid air-cells are clear. The included paranasal sinuses are well-aerated.Bilateral cataract extractions. Intact orbits and globes. OTHER: None. IMPRESSION: Atrophy with chronic moderate small vessel ischemic disease. No acute intracranial abnormality. Electronically Signed   By: Tollie Ethavid  Kwon M.D.   On: 11/16/2018 21:42   Ct Angio Chest Pe W Or Wo Contrast  Result Date: 11/17/2018 CLINICAL DATA:  Shortness of breath. Syncope. EXAM: CT ANGIOGRAPHY CHEST WITH CONTRAST TECHNIQUE: Multidetector CT imaging of the chest was performed using the standard protocol during bolus administration of intravenous contrast. Multiplanar CT image reconstructions and MIPs were obtained to evaluate the vascular anatomy. CONTRAST:  100mL ISOVUE-370 IOPAMIDOL (ISOVUE-370) INJECTION 76% COMPARISON:  Chest radiograph 11/17/2018 FINDINGS: Cardiovascular: Pulmonary arterial opacification is adequate, however evaluation is limited by motion artifact, particularly the segmental and subsegmental branches. No lobar or more central pulmonary emboli are identified with right upper lobar assessment limited by streak artifact from SVC contrast. No definite segmental emboli are identified where adequately  visualized. There is an aberrant right subclavian artery, a normal variant. Aortic atherosclerosis is noted without evidence of dissection or aneurysm. Coronary artery atherosclerosis is noted. The heart is borderline enlarged. There is no pericardial effusion. Mediastinum/Nodes: No enlarged axillary, mediastinal, or hilar lymph nodes. Large sliding hiatal hernia. Lungs/Pleura: No pleural effusion or pneumothorax. Assessment of the lung parenchyma is limited by respiratory motion. Mild dependent atelectasis is present bilaterally. No mass. Upper Abdomen: 1.4 cm left upper pole renal cyst.  Musculoskeletal: T8 and T12 compression fracture status post augmentation. Mild T3, T6, and T11 compression fractures, also favored to be chronic. No suspicious osseous lesion. Review of the MIP images confirms the above findings. IMPRESSION: 1. Limited examination due to motion artifact. No central pulmonary emboli identified. 2. Large sliding hiatal hernia. 3. Aortic Atherosclerosis (ICD10-I70.0). Electronically Signed   By: Sebastian Ache M.D.   On: 11/17/2018 15:25   Mr Brain Wo Contrast  Result Date: 11/17/2018 CLINICAL DATA:  Syncope and fainting.  Patient became unresponsive. The examination had to be discontinued prior to completion due to patient unable to tolerate imaging. Patient attempted to exit the scanner. EXAM: MRI HEAD WITHOUT CONTRAST TECHNIQUE: Multiplanar, multiecho pulse sequences of the brain and surrounding structures were obtained without intravenous contrast. COMPARISON:  CT head without contrast 11/16/2018 FINDINGS: Brain: Only the axial diffusion weighted imaging was completed. The diffusion sequence is moderately degraded by patient motion. No definite restricted diffusion is present. There is no significant susceptibility to suggest hemorrhage. Advanced atrophy and white matter disease is present. No extra-axial or intraventricular hemorrhage is present. Vascular: Not assessed Skull and upper cervical  spine: Not assessed Sinuses/Orbits: Not assessed IMPRESSION: 1. No acute infarct. 2. Limited study with only diffusion-weighted imaging. 3. Advanced atrophy and white matter disease likely reflects the sequela of chronic microvascular ischemia. 4. Other aspects of the brain and head can not be assessed due to the lack of additional sequences. Electronically Signed   By: Marin Roberts M.D.   On: 11/17/2018 07:00    Scheduled Meds: . ALPRAZolam  0.5 mg Oral BID  . atorvastatin  5 mg Oral QHS  . calcium-vitamin D  1 tablet Oral BID  . donepezil  10 mg Oral QHS  . enoxaparin (LOVENOX) injection  40 mg Subcutaneous QHS  . levothyroxine  75 mcg Oral Q0600  . loratadine  10 mg Oral Daily  . Melatonin  5 mg Oral QHS  . meloxicam  15 mg Oral Daily  . [START ON 11/20/2018] mineral oil  1 mL OTIC (EAR) Weekly  . multivitamin with minerals  1 tablet Oral q morning - 10a  . pantoprazole  40 mg Oral Daily  . sodium chloride flush  3 mL Intravenous Q12H  . traMADol  50 mg Oral BID  . traZODone  75 mg Oral QHS  . venlafaxine XR  75 mg Oral Q breakfast    Continuous Infusions: . cefTRIAXone (ROCEPHIN)  IV 1 g (11/17/18 2220)     Joycelyn Das, MD  [facility] 11/18/2018

## 2018-11-19 DIAGNOSIS — J9601 Acute respiratory failure with hypoxia: Secondary | ICD-10-CM

## 2018-11-19 LAB — CULTURE, BLOOD (ROUTINE X 2): Special Requests: ADEQUATE

## 2018-11-19 LAB — URINE CULTURE: Culture: >=100000 — AB

## 2018-11-19 LAB — GLUCOSE, CAPILLARY: Glucose-Capillary: 109 mg/dL — ABNORMAL HIGH (ref 70–99)

## 2018-11-19 NOTE — [place] FL2 (Addendum)
[place] MEDICAID FL2 LEVEL OF CARE SCREENING TOOL     IDENTIFICATION  Patient Name: Gina Hodges Birthdate: 1926-11-22 Sex: female Admission Date (Current Location): 11/16/2018  [facility] and [place] Number:  Producer, television/film/video and Address:  [facility],  [address] [place] [address]      Provider Number: [id number]  Attending Physician Name and Address:  Joycelyn Das, MD  Relative Name and Phone Number:  Kathlen Brunswick [phone]    Current Level of Care: Hospital Recommended Level of Care: Assisted Living Facility Prior Approval Number:    Date Approved/Denied:   PASRR Number:    Discharge Plan: Other (Comment)(assisted living)    Current Diagnoses: Patient Active Problem List   Diagnosis Date Noted  . Syncope 11/17/2018  . Syncope and collapse 11/16/2018  . Lactic acidosis   . Healthcare-associated pneumonia 02/19/2016  . SIRS (systemic inflammatory response syndrome) (HCC)   . Ingrown nail 11/12/2013  . Compression fracture of spine (HCC) 11/07/2013  . UTI (urinary tract infection) 11/07/2013  . Sternal fracture 11/07/2013  . Hypothyroidism 11/07/2013  . Hyperlipidemia   . Dementia with behavioral problem (HCC)   . GERD (gastroesophageal reflux disease)   . PAF (paroxysmal atrial fibrillation) (HCC)   . Generalized anxiety disorder 04/05/2013    Orientation RESPIRATION BLADDER Height & Weight     Self, Situation, Place  Normal Incontinent, External catheter Weight: 162 lb 0.6 oz (73.5 kg) Height:  5\' 1"  (154.9 cm)  BEHAVIORAL SYMPTOMS/MOOD NEUROLOGICAL BOWEL NUTRITION STATUS      Incontinent Diet(regular)  AMBULATORY STATUS COMMUNICATION OF NEEDS Skin   Extensive Assist Verbally Normal                       Personal Care Assistance Level of Assistance  Bathing, Feeding, Dressing Bathing Assistance: Limited assistance Feeding assistance: Limited assistance Dressing Assistance: Limited assistance     Functional  Limitations Info  Hearing   Hearing Info: Impaired      SPECIAL CARE FACTORS FREQUENCY                       Contractures Contractures Info: Not present    Additional Factors Info  Code Status, Allergies(full) Code Status Info: full Allergies Info: penicillins, sulfa antibiotics, codeine           Current Medications (11/19/2018):  This is the current hospital active medication list Current Facility-Administered Medications  Medication Dose Route Frequency Provider Last Rate Last Dose  . acetaminophen (TYLENOL) tablet 650 mg  650 mg Oral Q8H PRN Rometta Emery, MD      . ALPRAZolam Prudy Feeler) tablet 0.5 mg  0.5 mg Oral BID Earlie Lou L, MD   0.5 mg at 11/19/18 1020  . alum & mag hydroxide-simeth (MAALOX/MYLANTA) 200-200-20 MG/5ML suspension 30 mL  30 mL Oral Q6H PRN Earlie Lou L, MD      . atorvastatin (LIPITOR) tablet 5 mg  5 mg Oral QHS Rometta Emery, MD   5 mg at 11/18/18 2120  . calcium-vitamin D (OSCAL WITH D) 500-200 MG-UNIT per tablet 1 tablet  1 tablet Oral BID Rometta Emery, MD   1 tablet at 11/19/18 1020  . cefTRIAXone (ROCEPHIN) 1 g in sodium chloride 0.9 % 100 mL IVPB  1 g Intravenous Q24H Earlie Lou L, MD 200 mL/hr at 11/19/18 1202 1 g at 11/19/18 1202  . donepezil (ARICEPT) tablet 10 mg  10 mg Oral QHS Garba, Mohammad L,  MD   10 mg at 11/18/18 2121  . enoxaparin (LOVENOX) injection 40 mg  40 mg Subcutaneous QHS Earlie LouGarba, Mohammad L, MD   40 mg at 11/18/18 2121  . hydrALAZINE (APRESOLINE) injection 10 mg  10 mg Intravenous Q6H PRN Pokhrel, Laxman, MD   10 mg at 11/17/18 1220  . hydrocerin (EUCERIN) cream 1 application  1 application Topical QHS PRN Earlie LouGarba, Mohammad L, MD      . levothyroxine (SYNTHROID, LEVOTHROID) tablet 75 mcg  75 mcg Oral Q0600 Rometta EmeryGarba, Mohammad L, MD   75 mcg at 11/19/18 0508  . loperamide (IMODIUM) capsule 2-4 mg  2-4 mg Oral PRN Rometta EmeryGarba, Mohammad L, MD      . loratadine (CLARITIN) tablet 10 mg  10 mg Oral Daily Earlie LouGarba,  Mohammad L, MD   10 mg at 11/19/18 1020  . Melatonin TABS 5 mg  5 mg Oral QHS Rometta EmeryGarba, Mohammad L, MD   5 mg at 11/18/18 2119  . meloxicam (MOBIC) tablet 15 mg  15 mg Oral Daily Earlie LouGarba, Mohammad L, MD   15 mg at 11/19/18 1020  . [START ON 11/20/2018] mineral oil liquid 1 mL  1 mL OTIC (EAR) Weekly Garba, Mohammad L, MD      . multivitamin with minerals tablet 1 tablet  1 tablet Oral q morning - 10a Rometta EmeryGarba, Mohammad L, MD   1 tablet at 11/19/18 1020  . ondansetron (ZOFRAN) tablet 4 mg  4 mg Oral Q6H PRN Rometta EmeryGarba, Mohammad L, MD       Or  . ondansetron (ZOFRAN) injection 4 mg  4 mg Intravenous Q6H PRN Rometta EmeryGarba, Mohammad L, MD   4 mg at 11/17/18 2040  . pantoprazole (PROTONIX) EC tablet 40 mg  40 mg Oral Daily Earlie LouGarba, Mohammad L, MD   40 mg at 11/19/18 1021  . polyethylene glycol (MIRALAX / GLYCOLAX) packet 17 g  17 g Oral Daily PRN Mikeal HawthorneGarba, Mohammad L, MD      . sodium chloride flush (NS) 0.9 % injection 3 mL  3 mL Intravenous Q12H Earlie LouGarba, Mohammad L, MD   3 mL at 11/19/18 1023  . traMADol (ULTRAM) tablet 50 mg  50 mg Oral BID Earlie LouGarba, Mohammad L, MD   50 mg at 11/19/18 1022  . traZODone (DESYREL) tablet 75 mg  75 mg Oral QHS Rometta EmeryGarba, Mohammad L, MD   75 mg at 11/18/18 2120  . venlafaxine XR (EFFEXOR-XR) 24 hr capsule 75 mg  75 mg Oral Q breakfast Earlie LouGarba, Mohammad L, MD   75 mg at 11/19/18 1021     Discharge Medications:  acetaminophen 650 MG CR tablet Commonly known as:  TYLENOL Take 650 mg by mouth every 8 (eight) hours as needed for pain.   ALPRAZolam 0.5 MG tablet Commonly known as:  XANAX Take 0.5 mg by mouth 2 (two) times daily.   alum & mag hydroxide-simeth 200-200-20 MG/5ML suspension Commonly known as:  MAALOX/MYLANTA Take 30 mLs by mouth every 6 (six) hours as needed for indigestion or heartburn.   atorvastatin 10 MG tablet Commonly known as:  LIPITOR Take 5 mg by mouth at bedtime.   calcium-vitamin D 500-200 MG-UNIT tablet Commonly known as:  OSCAL WITH D Take 1 tablet by mouth 2 (two)  times daily.   donepezil 10 MG tablet Commonly known as:  ARICEPT Take 10 mg by mouth at bedtime.   eucerin cream Apply 1 application topically at bedtime as needed (itching).   levothyroxine 75 MCG tablet Commonly known as:  SYNTHROID, LEVOTHROID Take  1 tablet by mouth daily before breakfast.   loperamide 2 MG capsule Commonly known as:  IMODIUM Take 2-4 mg by mouth See admin instructions. Give 1 capsule by mouth every 2 hours as needed for diarrhea. 4mg  for first loose stool then after each loose stool 2mg .   loratadine 10 MG tablet Commonly known as:  CLARITIN Take 10 mg by mouth daily.   Melatonin 5 MG Tabs Take 5 mg by mouth at bedtime.   meloxicam 15 MG tablet Commonly known as:  MOBIC Take 15 mg by mouth daily.   mineral oil external liquid Place 2 drops into both ears once a week. Apply 2 drops to both ears once a week on Mondays for wax build-up   multivitamin with minerals Tabs tablet Take 1 tablet by mouth every morning.   omeprazole 20 MG capsule Commonly known as:  PRILOSEC Take 20 mg by mouth 2 (two) times daily.   polyethylene glycol packet Commonly known as:  MIRALAX / GLYCOLAX Take 17 g by mouth daily as needed for mild constipation.   traMADol 50 MG tablet Commonly known as:  ULTRAM Take 1 tablet by mouth every 6 hours as needed for pain. What changed:    how much to take  how to take this  when to take this  additional instructions   traZODone 150 MG tablet Commonly known as:  DESYREL Take 75 mg by mouth at bedtime.   venlafaxine XR 75 MG 24 hr capsule Commonly known as:  EFFEXOR-XR Take 75 mg by mouth daily with breakfast    Relevant Imaging Results:  Relevant Lab Results:   Additional Information [id number]  Verna Czech [place], [place]

## 2018-11-19 NOTE — Discharge Summary (Signed)
Physician Discharge Summary  Tala Eber ZOX:[id number] DOB: 06/30/1927 DOA: 11/16/2018  PCP: Patient, No Pcp Per  Admit date: 11/16/2018 Discharge date: 11/19/2018  Admitted From: Home  Discharge disposition: SNF   Recommendations for Outpatient Follow-Up:   Follow up with your primary care provider in one week.  Increase fluid intake.   Discharge Diagnosis:   Principal Problem:   Syncope and collapse Active Problems:   Generalized anxiety disorder   Hyperlipidemia   Dementia with behavioral problem (HCC)   GERD (gastroesophageal reflux disease)   PAF (paroxysmal atrial fibrillation) (HCC)   UTI (urinary tract infection)   Syncope    Discharge Condition: Improved.  Diet recommendation: Low sodium, heart healthy  Wound care: None.  Code status: Full.  History of Present Illness:   Johari Perdueis a 85 y.o.femalewith medical history significant ofdementia, hypertension, hyperlipidemia, atrial fibrillation, anxiety disorder, hypothyroidism presented to the hospital for evaluation of syncope.  Patient was apparently sitting downIn the toiletand was noted to slump over and passed out. 1 of the workers laid her down on the floor and started CPR. EMS were called and patient was brought in. Blood sugar and blood pressure were apparently stable at the time.Patient had a single rescue breath and then became alert. Patient was therefore being admitted with syncopal episode of unknown cause.  In the ED,Temperature 97.7 blood pressure 140/70 pulse 79 respirate of 19 oxygen sat 94% room air. White count was 11.8 and hemoglobin 11.1 otherwise chemistry and CBC are within normal. Troponin  negative EKG showed no significant changes. Urinalysis showed nitrite positive many bacteria WBC more than 50 RBC 11-20 and positive leukocytes. Head CT without contrast was negative.  Patient was then admitted to the hospital for further evaluation and treatment.  Hospital Course:    Syncopal episode: No significant arrhythmias noted.  Patient does have history of paroxysmal atrial fibrillation.  Possibility of vasovagal syncope.  2D echocardiogram showed left ventricular ejection fraction of 60 to 65%.  There was impaired diastolic relaxation.  CT angiogram of the chest did not show any central pulmonary emboli though the scan was limited.  MRI of the brain did not show any evidence of acute infarct.  Positive blood culture x1 bottle.  Likely to be a contaminant.    Acute hypoxic respiratory failure, likely secondary to diminished ventilatory effort.  Resolved. X-ray did not show any evidence of pulmonary edema.  CT scan of the chest did not show any obvious pulmonary embolism. Continue spirometry.  E. coli UTI:  Received  Rocephin. Leukocytosis has improved. Will complete day 3 rocephin today.  History of dementia: Baseline continue supportive care  Paroxysmal atrial fibrillation:Currently in sinus rhythm. Not on chronic anticoagulation. Monitor closely.  Hyperlipidemia:Continue with statin.  GERD:Continue PPIs  Generalized anxiety disorder:Continue with home regimen  Disposition: I spoke with the patient's nephew on the phone and updated him about the clinical condition of the patient and plan for disposition.   Medical Consultants:    None.   Subjective:   Today, patient feels ok, no nausea vomiting, dizziness or shortness of breath.  Discharge Exam:   Vitals:   11/18/18 2125 11/19/18 0507  BP: 113/73 108/62  Pulse: 81 67  Resp: 18 14  Temp: 98.5 F (36.9 C) 98.4 F (36.9 C)  SpO2: 98% 96%   Vitals:   11/18/18 1245 11/18/18 2125 11/19/18 0500 11/19/18 0507  BP: 110/60 113/73  108/62  Pulse: 75 81  67  Resp: 16 18  14   Temp:  98.1 F (36.7 C) 98.5 F (36.9 C)  98.4 F (36.9 C)  TempSrc: Oral Oral  Oral  SpO2: 99% 98%  96%  Weight:   73.5 kg   Height:        General exam: Appears calm and comfortable ,Not in distress,  hard of hearing HEENT:PERRL,Oral mucosa moist Respiratory system: Bilateral equal air entry, normal vesicular breath sounds, no wheezes or crackles  Cardiovascular system: S1 & S2 heard, RRR.  Gastrointestinal system: Abdomen is nondistended, soft and nontender. No organomegaly or masses felt. Normal bowel sounds heard. Central nervous system: Alert awake and communicative.  No focal neurological deficits. Extremities: No edema, no clubbing ,no cyanosis, distal peripheral pulses palpable. Skin: No rashes, lesions or ulcers,no icterus ,no pallor MSK: Normal muscle bulk,tone ,power    Procedures:    MRI of the brain, 2D echocardiogram, CT angiogram,  The results of significant diagnostics from this hospitalization (including imaging, microbiology, ancillary and laboratory) are listed below for reference.     Diagnostic Studies:   Dg Chest 1 View  Result Date: 11/17/2018 CLINICAL DATA:  Dyspnea. EXAM: CHEST  1 VIEW COMPARISON:  Radiograph of November 16, 2018. FINDINGS: Stable cardiomediastinal silhouette. No pneumothorax or pleural effusion is noted. No acute pulmonary disease is noted. Bony thorax is unremarkable. IMPRESSION: No acute cardiopulmonary abnormality seen. Electronically Signed   By: Lupita Raider, M.D.   On: 11/17/2018 12:50   Dg Chest 1 View  Result Date: 11/16/2018 CLINICAL DATA:  Syncope. EXAM: CHEST  1 VIEW COMPARISON:  Radiographs of Feb 22, 2016. FINDINGS: Stable cardiomediastinal silhouette. No pneumothorax or pleural effusion is noted. Both lungs are clear. The visualized skeletal structures are unremarkable. IMPRESSION: No acute cardiopulmonary abnormality seen. Electronically Signed   By: Lupita Raider, M.D.   On: 11/16/2018 21:12   Ct Head Wo Contrast  Result Date: 11/16/2018 CLINICAL DATA:  Syncopal episode EXAM: CT HEAD WITHOUT CONTRAST TECHNIQUE: Contiguous axial images were obtained from the base of the skull through the vertex without intravenous  contrast. COMPARISON:  12/20/2016 FINDINGS: BRAIN: There is chronic stable moderate sulcal and ventricular prominence consistent with superficial and central atrophy. No intraparenchymal hemorrhage, mass effect nor midline shift. Periventricular and subcortical white matter hypodensities consistent with chronic moderate small vessel ischemic disease are identified. No acute large vascular territory infarcts. No abnormal extra-axial fluid collections. Basal cisterns are not effaced and midline. VASCULAR: Moderate calcific atherosclerosis of the carotid siphons. SKULL: No skull fracture. No significant scalp soft tissue swelling. SINUSES/ORBITS: The mastoid air-cells are clear. The included paranasal sinuses are well-aerated.Bilateral cataract extractions. Intact orbits and globes. OTHER: None. IMPRESSION: Atrophy with chronic moderate small vessel ischemic disease. No acute intracranial abnormality. Electronically Signed   By: Tollie Eth M.D.   On: 11/16/2018 21:42   Ct Angio Chest Pe W Or Wo Contrast  Result Date: 11/17/2018 CLINICAL DATA:  Shortness of breath. Syncope. EXAM: CT ANGIOGRAPHY CHEST WITH CONTRAST TECHNIQUE: Multidetector CT imaging of the chest was performed using the standard protocol during bolus administration of intravenous contrast. Multiplanar CT image reconstructions and MIPs were obtained to evaluate the vascular anatomy. CONTRAST:  ISOVUE-370 IOPAMIDOL (ISOVUE-370) INJECTION 76% COMPARISON:  Chest radiograph 11/17/2018 FINDINGS: Cardiovascular: Pulmonary arterial opacification is adequate, however evaluation is limited by motion artifact, particularly the segmental and subsegmental branches. No lobar or more central pulmonary emboli are identified with right upper lobar assessment limited by streak artifact from SVC contrast. No definite segmental emboli are identified where adequately visualized. There  is an aberrant right subclavian artery, a normal variant. Aortic atherosclerosis  is noted without evidence of dissection or aneurysm. Coronary artery atherosclerosis is noted. The heart is borderline enlarged. There is no pericardial effusion. Mediastinum/Nodes: No enlarged axillary, mediastinal, or hilar lymph nodes. Large sliding hiatal hernia. Lungs/Pleura: No pleural effusion or pneumothorax. Assessment of the lung parenchyma is limited by respiratory motion. Mild dependent atelectasis is present bilaterally. No mass. Upper Abdomen: 1.4 cm left upper pole renal cyst. Musculoskeletal: T8 and T12 compression fracture status post augmentation. Mild T3, T6, and T11 compression fractures, also favored to be chronic. No suspicious osseous lesion. Review of the MIP images confirms the above findings. IMPRESSION: 1. Limited examination due to motion artifact. No central pulmonary emboli identified. 2. Large sliding hiatal hernia. 3. Aortic Atherosclerosis (ICD10-I70.0). Electronically Signed   By: Sebastian AcheAllen  Grady M.D.   On: 11/17/2018 15:25   Mr Brain Wo Contrast  Result Date: 11/17/2018 CLINICAL DATA:  Syncope and fainting.  Patient became unresponsive. The examination had to be discontinued prior to completion due to patient unable to tolerate imaging. Patient attempted to exit the scanner. EXAM: MRI HEAD WITHOUT CONTRAST TECHNIQUE: Multiplanar, multiecho pulse sequences of the brain and surrounding structures were obtained without intravenous contrast. COMPARISON:  CT head without contrast 11/16/2018 FINDINGS: Brain: Only the axial diffusion weighted imaging was completed. The diffusion sequence is moderately degraded by patient motion. No definite restricted diffusion is present. There is no significant susceptibility to suggest hemorrhage. Advanced atrophy and white matter disease is present. No extra-axial or intraventricular hemorrhage is present. Vascular: Not assessed Skull and upper cervical spine: Not assessed Sinuses/Orbits: Not assessed IMPRESSION: 1. No acute infarct. 2. Limited study  with only diffusion-weighted imaging. 3. Advanced atrophy and white matter disease likely reflects the sequela of chronic microvascular ischemia. 4. Other aspects of the brain and head can not be assessed due to the lack of additional sequences. Electronically Signed   By: Marin Robertshristopher  Mattern M.D.   On: 11/17/2018 07:00     Labs:   Basic Metabolic Panel: Recent Labs  Lab 11/16/18 2132 11/17/18 0351  NA 135 136  K 4.3 4.4  CL 101 105  CO2 27 23  GLUCOSE 113* 102*  BUN 23 17  CREATININE 0.76 0.71  CALCIUM 9.3 8.9   GFR Estimated Creatinine Clearance: 43.7 mL/min (by C-G formula based on SCr of 0.71 mg/dL). Liver Function Tests: Recent Labs  Lab 11/17/18 0351  AST 16  ALT 11  ALKPHOS 59  BILITOT 0.5  PROT 5.8*  ALBUMIN 3.2*   No results for input(s): LIPASE, AMYLASE in the last 168 hours. No results for input(s): AMMONIA in the last 168 hours. Coagulation profile No results for input(s): INR, PROTIME in the last 168 hours.  CBC: Recent Labs  Lab 11/16/18 2132 11/17/18 0351  WBC 11.8* 9.2  NEUTROABS 8.3*  --   HGB 11.1* 10.4*  HCT 36.9 35.7*  MCV 95.1 98.6  PLT 342 298   Cardiac Enzymes: Recent Labs  Lab 11/16/18 2132  TROPONINI <0.03   BNP: Invalid input(s): POCBNP CBG: Recent Labs  Lab 11/16/18 2208 11/17/18 0734 11/18/18 0756 11/18/18 0839 11/19/18 0514  GLUCAP 98 81 68* 110* 109*   D-Dimer No results for input(s): DDIMER in the last 72 hours. Hgb A1c No results for input(s): HGBA1C in the last 72 hours. Lipid Profile No results for input(s): CHOL, HDL, LDLCALC, TRIG, CHOLHDL, LDLDIRECT in the last 72 hours. Thyroid function studies No results for input(s): TSH,  T4TOTAL, T3FREE, THYROIDAB in the last 72 hours.  Invalid input(s): FREET3 Anemia work up No results for input(s): VITAMINB12, FOLATE, FERRITIN, TIBC, IRON, RETICCTPCT in the last 72 hours. Microbiology Recent Results (from the past 240 hour(s))  Culture, blood (routine x 2)      Status: Abnormal   Collection Time: 11/16/18  9:32 PM  Result Value Ref Range Status   Specimen Description   Final    BLOOD LEFT ANTECUBITAL Performed at [facility] [address], [place], [place] [address]    Special Requests   Final    BOTTLES DRAWN AEROBIC AND ANAEROBIC Blood Culture adequate volume Performed at [facility], [address] [place], [place] [address]    Culture  Setup Time   Final    GRAM POSITIVE COCCI IN CLUSTERS IN BOTH AEROBIC AND ANAEROBIC BOTTLES CRITICAL RESULT CALLED TO, READ BACK BY AND VERIFIED WITH: Damaris Hippo PharmD 16:35 11/17/18 ([email or web address])    Culture (A)  Final    STAPHYLOCOCCUS SPECIES (COAGULASE NEGATIVE) THE SIGNIFICANCE OF ISOLATING THIS ORGANISM FROM A SINGLE SET OF BLOOD CULTURES WHEN MULTIPLE SETS ARE DRAWN IS UNCERTAIN. PLEASE NOTIFY THE MICROBIOLOGY DEPARTMENT WITHIN ONE WEEK IF SPECIATION AND SENSITIVITIES ARE REQUIRED. Performed at [facility], [address] [place], [place] [address]    Report Status 11/19/2018 FINAL  Final  Blood Culture ID Panel (Reflexed)     Status: Abnormal   Collection Time: 11/16/18  9:32 PM  Result Value Ref Range Status   Enterococcus species NOT DETECTED NOT DETECTED Final   Listeria monocytogenes NOT DETECTED NOT DETECTED Final   Staphylococcus species DETECTED (A) NOT DETECTED Final    Comment: Methicillin (oxacillin) resistant coagulase negative staphylococcus. Possible blood culture contaminant (unless isolated from more than one blood culture draw or clinical case suggests pathogenicity). No antibiotic treatment is indicated for blood  culture contaminants. CRITICAL RESULT CALLED TO, READ BACK BY AND VERIFIED WITH: Damaris Hippo PharmD 16:35 11/17/18 ([email or web address])    Staphylococcus aureus (BCID) NOT DETECTED NOT DETECTED Final   Methicillin resistance DETECTED (A) NOT DETECTED Final    Comment: CRITICAL RESULT CALLED TO, READ BACK BY AND VERIFIED WITH: Damaris Hippo PharmD 16:35 11/17/18 ([email or web address])    Streptococcus species NOT DETECTED NOT DETECTED Final   Streptococcus agalactiae NOT DETECTED NOT DETECTED Final   Streptococcus pneumoniae NOT DETECTED NOT DETECTED Final   Streptococcus pyogenes NOT DETECTED NOT DETECTED Final   Acinetobacter baumannii NOT DETECTED NOT DETECTED Final   Enterobacteriaceae species NOT DETECTED NOT DETECTED Final   Enterobacter cloacae complex NOT DETECTED NOT DETECTED Final   Escherichia coli NOT DETECTED NOT DETECTED Final   Klebsiella oxytoca NOT DETECTED NOT DETECTED Final   Klebsiella pneumoniae NOT DETECTED NOT DETECTED Final   Proteus species NOT DETECTED NOT DETECTED Final   Serratia marcescens NOT DETECTED NOT DETECTED Final   Haemophilus influenzae NOT DETECTED NOT DETECTED Final   Neisseria meningitidis NOT DETECTED NOT DETECTED Final   Pseudomonas aeruginosa NOT DETECTED NOT DETECTED Final   Candida albicans NOT DETECTED NOT DETECTED Final   Candida glabrata NOT DETECTED NOT DETECTED Final   Candida krusei NOT DETECTED NOT DETECTED Final   Candida parapsilosis NOT DETECTED NOT DETECTED Final   Candida tropicalis NOT DETECTED NOT DETECTED Final    Comment: Performed at [facility], [address] [place], [place] [address]  Urine culture     Status: Abnormal   Collection Time: 11/16/18  9:44 PM  Result  Value Ref Range Status   Specimen Description   Final    URINE, CATHETERIZED Performed at [facility], [address], [place], [place] [address]    Special Requests   Final    NONE Performed at [facility], [address] [place], [place] [address]    Culture >=100,000 COLONIES/mL ESCHERICHIA COLI (A)  Final   Report Status 11/19/2018 FINAL  Final   Organism ID, Bacteria ESCHERICHIA COLI (A)  Final      Susceptibility   Escherichia coli - MIC*    AMPICILLIN <=2 SENSITIVE Sensitive     CEFAZOLIN <=4 SENSITIVE Sensitive     CEFTRIAXONE <=1  SENSITIVE Sensitive     CIPROFLOXACIN <=0.25 SENSITIVE Sensitive     GENTAMICIN <=1 SENSITIVE Sensitive     IMIPENEM <=0.25 SENSITIVE Sensitive     NITROFURANTOIN <=16 SENSITIVE Sensitive     TRIMETH/SULFA <=20 SENSITIVE Sensitive     AMPICILLIN/SULBACTAM <=2 SENSITIVE Sensitive     PIP/TAZO <=4 SENSITIVE Sensitive     Extended ESBL NEGATIVE Sensitive     * >=100,000 COLONIES/mL ESCHERICHIA COLI  Culture, blood (routine x 2)     Status: None (Preliminary result)   Collection Time: 11/16/18  9:44 PM  Result Value Ref Range Status   Specimen Description   Final    BLOOD RIGHT HAND Performed at [facility], [address] [place], [place] [address]    Special Requests   Final    BOTTLES DRAWN AEROBIC ONLY Blood Culture adequate volume Performed at [facility], [address] [place], [place] [address]    Culture   Final    NO GROWTH 3 DAYS Performed at [facility], [address], [place], [place] [address]    Report Status PENDING  Incomplete  MRSA PCR Screening     Status: None   Collection Time: 11/17/18  5:45 AM  Result Value Ref Range Status   MRSA by PCR NEGATIVE NEGATIVE Final    Comment:        The GeneXpert MRSA Assay (FDA approved for NASAL specimens only), is one component of a comprehensive MRSA colonization surveillance program. It is not intended to diagnose MRSA infection nor to guide or monitor treatment for MRSA infections. Performed at [facility], [address], [place], [place] [address]      Discharge Instructions:   Discharge Instructions    Diet - low sodium heart healthy   Complete by:  As directed    Discharge instructions   Complete by:  As directed    Resume home medications, increase fluid intake. Follow up with your primary care provider at SNF in 3-5 days.   Increase activity slowly   Complete by:  As directed      Allergies as of 11/19/2018      Reactions    Penicillins Other (See Comments)   Per MAR, unknown reaction   Sulfa Antibiotics Other (See Comments)   Per MAR, unknown reaction   Codeine Palpitations      Medication List    TAKE these medications   acetaminophen 650 MG CR tablet Commonly known as:  TYLENOL Take 650 mg by mouth every 8 (eight) hours as needed for pain.   ALPRAZolam 0.5 MG tablet Commonly known as:  XANAX Take 0.5 mg by mouth 2 (two) times daily.   alum & mag hydroxide-simeth 200-200-20 MG/5ML suspension Commonly known as:  MAALOX/MYLANTA Take 30 mLs by mouth  every 6 (six) hours as needed for indigestion or heartburn.   atorvastatin 10 MG tablet Commonly known as:  LIPITOR Take 5 mg by mouth at bedtime.   calcium-vitamin D 500-200 MG-UNIT tablet Commonly known as:  OSCAL WITH D Take 1 tablet by mouth 2 (two) times daily.   donepezil 10 MG tablet Commonly known as:  ARICEPT Take 10 mg by mouth at bedtime.   eucerin cream Apply 1 application topically at bedtime as needed (itching).   levothyroxine 75 MCG tablet Commonly known as:  SYNTHROID, LEVOTHROID Take 1 tablet by mouth daily before breakfast.   loperamide 2 MG capsule Commonly known as:  IMODIUM Take 2-4 mg by mouth See admin instructions. Give 1 capsule by mouth every 2 hours as needed for diarrhea. 4mg  for first loose stool then after each loose stool 2mg .   loratadine 10 MG tablet Commonly known as:  CLARITIN Take 10 mg by mouth daily.   Melatonin 5 MG Tabs Take 5 mg by mouth at bedtime.   meloxicam 15 MG tablet Commonly known as:  MOBIC Take 15 mg by mouth daily.   mineral oil external liquid Place 2 drops into both ears once a week. Apply 2 drops to both ears once a week on Mondays for wax build-up   multivitamin with minerals Tabs tablet Take 1 tablet by mouth every morning.   omeprazole 20 MG capsule Commonly known as:  PRILOSEC Take 20 mg by mouth 2 (two) times daily.   polyethylene glycol packet Commonly known as:   MIRALAX / GLYCOLAX Take 17 g by mouth daily as needed for mild constipation.   traMADol 50 MG tablet Commonly known as:  ULTRAM Take 1 tablet by mouth every 6 hours as needed for pain. What changed:    how much to take  how to take this  when to take this  additional instructions   traZODone 150 MG tablet Commonly known as:  DESYREL Take 75 mg by mouth at bedtime.   venlafaxine XR 75 MG 24 hr capsule Commonly known as:  EFFEXOR-XR Take 75 mg by mouth daily with breakfast.       Time coordinating discharge: 39 minutes  Signed:  Ferguson Gertner  [facility] 11/19/2018, 9:07 AM

## 2018-11-19 NOTE — Progress Notes (Addendum)
   Attempted to arrange discharge for patient back to [facility]. Facility stated that they were not able to take patient back today as the person that has to approve returns is not available on the weekends.  Patient to be able to return tomorrow morning.11/20/18.  1:23 Chiropodist of social work contacted [employer], spoke with [place] who was abe to confirm that patient can return to the facility today.  CSW will call nephew to update on change of discharge plan. FL2 to be updated.   Verna Czech, LCSW [facility] [phone]

## 2018-11-19 NOTE — Progress Notes (Addendum)
   Phone call to patient's nephew Kathlen Brunswick do confirm patient's return to [facility]. Patient very hard of hearing and requested that this social worker speak with family. Per [name], patient will need to return to [facility] on [address] by non-emergent ambulance. Patient's nephew agrees with discharge plan.  This social work to confirm patient's return to [facility] as well as arrange for transportation using non-emergent ambulance when medically stable    [place], LCSW [facility] [phone]

## 2018-11-19 NOTE — Progress Notes (Signed)
Called report to [place] at [facility]. AVS updated and sent with d/c paperwork.  Earnest Conroy. Clelia Croft, RN

## 2018-11-19 NOTE — Progress Notes (Signed)
  Patient discharging back to [facility]. Confirmed ability to return and sent required documents. [facility] has been called for transport. RN to call report [phone].   Patient's nephew to be updated.   Adriana Reams [facility] Care Management [phone]

## 2018-11-21 LAB — CULTURE, BLOOD (ROUTINE X 2)
Culture: NO GROWTH
Special Requests: ADEQUATE

## 2019-02-16 ENCOUNTER — Emergency Department (HOSPITAL_COMMUNITY)
Admission: EM | Admit: 2019-02-16 | Discharge: 2019-02-17 | Disposition: A | Discharge status: Home / Self Care | Payer: Medicare HMO | Attending: Emergency Medicine | Admitting: Emergency Medicine

## 2019-02-16 ENCOUNTER — Other Ambulatory Visit: Payer: Self-pay

## 2019-02-16 ENCOUNTER — Encounter (HOSPITAL_COMMUNITY): Payer: Self-pay

## 2019-02-16 ENCOUNTER — Emergency Department (HOSPITAL_COMMUNITY): Payer: Medicare HMO

## 2019-02-16 DIAGNOSIS — E079 Disorder of thyroid, unspecified: Secondary | ICD-10-CM | POA: Diagnosis not present

## 2019-02-16 DIAGNOSIS — F0390 Unspecified dementia without behavioral disturbance: Secondary | ICD-10-CM | POA: Diagnosis not present

## 2019-02-16 DIAGNOSIS — I480 Paroxysmal atrial fibrillation: Secondary | ICD-10-CM | POA: Diagnosis not present

## 2019-02-16 DIAGNOSIS — N3000 Acute cystitis without hematuria: Secondary | ICD-10-CM | POA: Insufficient documentation

## 2019-02-16 DIAGNOSIS — R55 Syncope and collapse: Secondary | ICD-10-CM | POA: Diagnosis present

## 2019-02-16 DIAGNOSIS — Z79899 Other long term (current) drug therapy: Secondary | ICD-10-CM | POA: Insufficient documentation

## 2019-02-16 LAB — COMPREHENSIVE METABOLIC PANEL
ALT: 13 U/L (ref 0–44)
AST: 16 U/L (ref 15–41)
Albumin: 3.5 g/dL (ref 3.5–5.0)
Alkaline Phosphatase: 88 U/L (ref 38–126)
Anion gap: 9 (ref 5–15)
BUN: 20 mg/dL (ref 8–23)
CO2: 25 mmol/L (ref 22–32)
Calcium: 9 mg/dL (ref 8.9–10.3)
Chloride: 106 mmol/L (ref 98–111)
Creatinine, Ser: 0.99 mg/dL (ref 0.44–1.00)
GFR calc Af Amer: 59 mL/min — ABNORMAL LOW (ref >60)
GFR calc non Af Amer: 51 mL/min — ABNORMAL LOW (ref >60)
Glucose, Bld: 143 mg/dL — ABNORMAL HIGH (ref 70–99)
Potassium: 3.9 mmol/L (ref 3.5–5.1)
Sodium: 140 mmol/L (ref 135–145)
Total Bilirubin: 0.4 mg/dL (ref 0.3–1.2)
Total Protein: 6.3 g/dL — ABNORMAL LOW (ref 6.5–8.1)

## 2019-02-16 LAB — URINALYSIS, ROUTINE W REFLEX MICROSCOPIC
Bilirubin Urine: NEGATIVE
Glucose, UA: NEGATIVE mg/dL
Hgb urine dipstick: NEGATIVE
Ketones, ur: NEGATIVE mg/dL
Nitrite: POSITIVE — AB
Protein, ur: NEGATIVE mg/dL
Specific Gravity, Urine: 1.029 (ref 1.005–1.030)
WBC, UA: >50 WBC/hpf — ABNORMAL HIGH (ref 0–5)
pH: 5 (ref 5.0–8.0)

## 2019-02-16 LAB — CBC
HCT: 36.2 % (ref 36.0–46.0)
Hemoglobin: 11 g/dL — ABNORMAL LOW (ref 12.0–15.0)
MCH: 29 pg (ref 26.0–34.0)
MCHC: 30.4 g/dL (ref 30.0–36.0)
MCV: 95.5 fL (ref 80.0–100.0)
Platelets: 337 10*3/uL (ref 150–400)
RBC: 3.79 MIL/uL — ABNORMAL LOW (ref 3.87–5.11)
RDW: 13.5 % (ref 11.5–15.5)
WBC: 11 10*3/uL — ABNORMAL HIGH (ref 4.0–10.5)
nRBC: 0 % (ref 0.0–0.2)

## 2019-02-16 MED ORDER — CEPHALEXIN 500 MG PO CAPS: 500.0000 mg | ORAL_CAPSULE | Freq: Three times a day (TID) | ORAL | 0 refills | Status: DC

## 2019-02-16 MED ORDER — SODIUM CHLORIDE 0.9 % IV SOLN
1.0000 g | Freq: Once | INTRAVENOUS | Status: AC
  Administered 2019-02-16: 1 g via INTRAVENOUS
  Filled 2019-02-16: qty 10

## 2019-02-16 NOTE — ED Provider Notes (Signed)
[facility] EMERGENCY DEPARTMENT Provider Note   CSN: [id number] Arrival date & time: 02/16/19  1955    History   Chief Complaint Chief Complaint  Patient presents with  . Loss of Consciousness  . Transient Ischemic Attack   Level 5 caveat: Dementia.  HPI Gina Hodges is a 85 y.o. female.     HPI A 85 year old female brought to the emergency department with possible syncopal episode today from a chair.  Questionable facial droop and drooling however when EMS arrived her speech was back to baseline.  On arrival to emergency department the patient is without complaint.  No reported vomiting or fevers.  Vital signs stable for EMS.  Past Medical History:  Diagnosis Date  . Anemia   . Anxiety   . Cerebral infarct (HCC)   . Dementia (HCC)   . Dementia with behavioral problem (HCC)   . GERD (gastroesophageal reflux disease)   . Hyperlipidemia   . Osteoporosis   . PAF (paroxysmal atrial fibrillation) (HCC)   . Thyroid disease   . Ulcerative esophagitis   . UTI (urinary tract infection)   . Vertigo     Patient Active Problem List   Diagnosis Date Noted  . Syncope 11/17/2018  . Syncope and collapse 11/16/2018  . Lactic acidosis   . Healthcare-associated pneumonia 02/19/2016  . SIRS (systemic inflammatory response syndrome) (HCC)   . Ingrown nail 11/12/2013  . Compression fracture of spine (HCC) 11/07/2013  . UTI (urinary tract infection) 11/07/2013  . Sternal fracture 11/07/2013  . Hypothyroidism 11/07/2013  . Hyperlipidemia   . Dementia with behavioral problem (HCC)   . GERD (gastroesophageal reflux disease)   . PAF (paroxysmal atrial fibrillation) (HCC)   . Generalized anxiety disorder 04/05/2013    Past Surgical History:  Procedure Laterality Date  . ABDOMINAL HYSTERECTOMY    . APPENDECTOMY    . CHOLECYSTECTOMY       OB History   No obstetric history on file.      Home Medications    Prior to Admission medications   Medication Sig  Start Date End Date Taking? Authorizing Provider  acetaminophen (TYLENOL) 650 MG CR tablet Take 650 mg by mouth 3 (three) times daily.    Yes [provider]  ALPRAZolam Prudy Feeler) 0.5 MG tablet Take 0.5 mg by mouth 2 (two) times daily.   Yes [provider]  alum & mag hydroxide-simeth (MAALOX/MYLANTA) 200-200-20 MG/5ML suspension Take 30 mLs by mouth every 8 (eight) hours as needed for indigestion or heartburn.    Yes [provider]  atorvastatin (LIPITOR) 10 MG tablet Take 5 mg by mouth at bedtime.  02/27/13  Yes [provider]  Calcium Carbonate-Vitamin D 600-400 MG-UNIT tablet Take 1 tablet by mouth 2 (two) times daily.    Yes [provider]  donepezil (ARICEPT) 10 MG tablet Take 10 mg by mouth at bedtime.  03/22/13  Yes [provider]  levothyroxine (SYNTHROID, LEVOTHROID) 75 MCG tablet Take 75 mcg by mouth daily before breakfast.  02/27/13  Yes [provider]  loperamide (IMODIUM) 2 MG capsule Take 2-4 mg by mouth See admin instructions. Take 2 capsules (4 mg) by mouth at first loose stool, then take 1 capsule (2 mg) every 2 hours as needed for diarrhea/loose stools 11/08/18  Yes [provider]  loratadine (CLARITIN) 10 MG tablet Take 10 mg by mouth daily.   Yes [provider]  Melatonin 5 MG TABS Take 5 mg by mouth at bedtime.  Yes [provider]  meloxicam (MOBIC) 15 MG tablet Take 15 mg by mouth daily. 10/30/18  Yes [provider]  Multiple Vitamin (MULTIVITAMIN WITH MINERALS) TABS tablet Take 1 tablet by mouth daily.    Yes [provider]  omeprazole (PRILOSEC) 20 MG capsule Take 20 mg by mouth 2 (two) times daily. 10/17/18  Yes [provider]  polyethylene glycol (MIRALAX / GLYCOLAX) packet Take 17 g by mouth daily as needed for mild constipation.   Yes [provider]  traMADol (ULTRAM) 50 MG tablet Take 1 tablet by mouth every 6 hours as needed for pain.  Patient taking differently: Take 50 mg by mouth 2 (two) times daily.  12/18/13  Yes Oneal GroutPandey, Mahima, MD  traZODone (DESYREL) 150 MG tablet Take 75 mg by mouth at bedtime. 10/27/18  Yes [provider]  venlafaxine XR (EFFEXOR-XR) 75 MG 24 hr capsule Take 75 mg by mouth daily with breakfast.   Yes [provider]  cephALEXin (KEFLEX) 500 MG capsule Take 1 capsule (500 mg total) by mouth 3 (three) times daily. 02/16/19   Azalia Bilisampos, Shiva Karis, MD  mineral oil external liquid Place 2 drops into both ears once a week. Apply 2 drops to both ears once a week on Mondays for wax build-up    [provider]  Skin Protectants, Misc. (EUCERIN) cream Apply 1 application topically at bedtime as needed (itching).    [provider]    Family History Family History  Problem Relation Age of Onset  . Depression Mother   . Bipolar disorder Daughter   . Drug abuse Son     Social History Social History   Tobacco Use  . Smoking status: Never Smoker  . Smokeless tobacco: Never Used  Substance Use Topics  . Alcohol use: No  . Drug use: No     Allergies   Penicillins; Bactrim [sulfamethoxazole-trimethoprim]; Sulfa antibiotics; and Codeine   Review of Systems Review of Systems  Unable to perform ROS: Dementia     Physical Exam Updated Vital Signs BP (!) 152/93   Pulse 81   Temp 97.9 F (36.6 C)   Resp (!) 24   SpO2 97%   Physical Exam Vitals signs and nursing note reviewed.  Constitutional:      General: She is not in acute distress.    Appearance: She is well-developed.  HENT:     Head: Normocephalic and atraumatic.  Eyes:     Pupils: Pupils are equal, round, and reactive to light.  Neck:     Musculoskeletal: Normal range of motion.  Cardiovascular:     Rate and Rhythm: Normal rate and regular rhythm.     Heart sounds: Normal heart sounds.  Pulmonary:     Effort: Pulmonary effort is normal.     Breath sounds: Normal breath sounds.  Abdominal:      General: There is no distension.     Palpations: Abdomen is soft.     Tenderness: There is no abdominal tenderness.  Musculoskeletal: Normal range of motion.  Skin:    General: Skin is warm and dry.  Neurological:     General: No focal deficit present.     Mental Status: She is alert. Mental status is at baseline.     Comments: 5/5 strength in major muscle groups of  bilateral upper and lower extremities. Speech normal. No facial asymetry.   Psychiatric:        Judgment: Judgment normal.      ED Treatments /  Results  Labs (all labs ordered are listed, but only abnormal results are displayed) Labs Reviewed  CBC - Abnormal; Notable for the following components:      Result Value   WBC 11.0 (*)    RBC 3.79 (*)    Hemoglobin 11.0 (*)    All other components within normal limits  COMPREHENSIVE METABOLIC PANEL - Abnormal; Notable for the following components:   Glucose, Bld 143 (*)    Total Protein 6.3 (*)    GFR calc non Af Amer 51 (*)    GFR calc Af Amer 59 (*)    All other components within normal limits  URINALYSIS, ROUTINE W REFLEX MICROSCOPIC - Abnormal; Notable for the following components:   APPearance HAZY (*)    Nitrite POSITIVE (*)    Leukocytes,Ua MODERATE (*)    WBC, UA >50 (*)    Bacteria, UA FEW (*)    All other components within normal limits    EKG None  Radiology Ct Head Wo Contrast  Result Date: 02/16/2019 CLINICAL DATA:  Syncope and fall this evening.  Initial encounter. EXAM: CT HEAD WITHOUT CONTRAST TECHNIQUE: Contiguous axial images were obtained from the base of the skull through the vertex without intravenous contrast. COMPARISON:  Head CT 11/16/2018. FINDINGS: Brain: No evidence of acute infarction, hemorrhage, hydrocephalus, extra-axial collection or mass lesion/mass effect. Marked atrophy and extensive chronic microvascular ischemic change noted. Vascular: No hyperdense vessel or unexpected calcification. Skull: Intact.  No focal lesion.  Sinuses/Orbits: Negative. Other: None. IMPRESSION: No acute abnormality. Atrophy and chronic microvascular ischemic change. Electronically Signed   By: Drusilla Kanner M.D.   On: 02/16/2019 21:25    Procedures Procedures (including critical care time)  Medications Ordered in ED Medications  cefTRIAXone (ROCEPHIN) 1 g in sodium chloride 0.9 % 100 mL IVPB (1 g Intravenous New Bag/Given 02/16/19 2317)     Initial Impression / Assessment and Plan / ED Course  I have reviewed the triage vital signs and the nursing notes.  Pertinent labs & imaging results that were available during my care of the patient were reviewed by me and considered in my medical decision making (see chart for details).        Vital signs are stable in the emergency department.  Work-up without significant abnormality except for possible urinary tract infection.  Urine culture will be sent.  IV Rocephin.  Home with Keflex.  No indication for additional work-up or treatment in the emergency department.  Patient stable for discharge.  Final Clinical Impressions(s) / ED Diagnoses   Final diagnoses:  Acute cystitis without hematuria    ED Discharge Orders         Ordered    cephALEXin (KEFLEX) 500 MG capsule  3 times daily     02/16/19 2343           Azalia Bilis, MD 02/17/19 [phone]

## 2019-02-16 NOTE — ED Triage Notes (Signed)
Per [facility], pt from [place] [facility] w/ several c/o consisting of: possible TIA, witnessed syncope, and fall from chair. It was reported that the pt had facial droop and drooling, though staff could not specify which side of the face she had facial droop. Unknown LSN. Facial droop subsided by the time EMS arrived. Her slurred speech is reported to be normal. The syncopal episode occurred while the pt was sitting in her chair. She slid out of the chair and onto the floor. Staff assisted her back into the chair. Pt has no complaints.   130/70 HR 67 95% RA  20 ga L AC

## 2019-02-17 NOTE — ED Notes (Signed)
Called ptar 

## 2019-02-17 NOTE — ED Notes (Signed)
Report given to [place] at [place]

## 2019-02-17 NOTE — ED Notes (Signed)
Report given to [facility] pt cleaned up and ready for transport

## 2019-02-19 LAB — URINE CULTURE: Culture: >=100000 — AB

## 2019-02-20 ENCOUNTER — Telehealth: Payer: Self-pay | Admitting: Emergency Medicine

## 2019-02-20 NOTE — Telephone Encounter (Signed)
Post ED Visit - Positive Culture Follow-up  Culture report reviewed by antimicrobial stewardship pharmacist: [facility] []  Enzo Bi, Pharm.D. []  Celedonio Miyamoto, Pharm.D., BCPS AQ-ID []  Garvin Fila, Pharm.D., BCPS []  Georgina Pillion, [address].D., BCPS []  [place], [address].D., BCPS, AAHIVP []  Estella Husk, Pharm.D., BCPS, AAHIVP [x]  Lysle Pearl, PharmD, BCPS []  Phillips Climes, PharmD, BCPS []  Agapito Games, PharmD, BCPS []  Verlan Friends, PharmD []  Mervyn Gay, PharmD, BCPS []  Vinnie Level, PharmD  [facility] []  Len Childs, PharmD []  Greer Pickerel, PharmD []  Adalberto Cole, PharmD []  Perlie Gold, Rph []  Lonell Face) Jean Rosenthal, PharmD []  Earl Many, PharmD []  Junita Push, PharmD []  Dorna Leitz, PharmD []  Terrilee Files, PharmD []  Lynann Beaver, PharmD []  Keturah Barre, PharmD []  Loralee Pacas, PharmD []  Bernadene Person, PharmD   Positive urine culture Treated with cephalexin, organism sensitive to the same and no further patient follow-up is required at this time.  Berle Mull 02/20/2019, 10:26 AM

## 2019-08-13 ENCOUNTER — Encounter (HOSPITAL_COMMUNITY): Payer: Self-pay | Admitting: Emergency Medicine

## 2019-08-13 ENCOUNTER — Emergency Department (HOSPITAL_COMMUNITY): Payer: Medicare HMO

## 2019-08-13 ENCOUNTER — Observation Stay (HOSPITAL_COMMUNITY)
Admission: EM | Admit: 2019-08-13 | Discharge: 2019-08-14 | Disposition: A | Discharge status: Home / Self Care | Payer: Medicare HMO | Attending: Student in an Organized Health Care Education/Training Program | Admitting: Student in an Organized Health Care Education/Training Program

## 2019-08-13 DIAGNOSIS — Z20828 Contact with and (suspected) exposure to other viral communicable diseases: Secondary | ICD-10-CM | POA: Insufficient documentation

## 2019-08-13 DIAGNOSIS — Y999 Unspecified external cause status: Secondary | ICD-10-CM | POA: Diagnosis not present

## 2019-08-13 DIAGNOSIS — E039 Hypothyroidism, unspecified: Secondary | ICD-10-CM | POA: Insufficient documentation

## 2019-08-13 DIAGNOSIS — F03918 Unspecified dementia, unspecified severity, with other behavioral disturbance: Secondary | ICD-10-CM | POA: Diagnosis present

## 2019-08-13 DIAGNOSIS — X58XXXA Exposure to other specified factors, initial encounter: Secondary | ICD-10-CM | POA: Insufficient documentation

## 2019-08-13 DIAGNOSIS — Y92129 Unspecified place in nursing home as the place of occurrence of the external cause: Secondary | ICD-10-CM | POA: Insufficient documentation

## 2019-08-13 DIAGNOSIS — R8271 Bacteriuria: Secondary | ICD-10-CM | POA: Insufficient documentation

## 2019-08-13 DIAGNOSIS — S0990XA Unspecified injury of head, initial encounter: Secondary | ICD-10-CM | POA: Diagnosis not present

## 2019-08-13 DIAGNOSIS — F411 Generalized anxiety disorder: Secondary | ICD-10-CM | POA: Diagnosis present

## 2019-08-13 DIAGNOSIS — Y939 Activity, unspecified: Secondary | ICD-10-CM | POA: Diagnosis not present

## 2019-08-13 DIAGNOSIS — Z9181 History of falling: Secondary | ICD-10-CM | POA: Insufficient documentation

## 2019-08-13 DIAGNOSIS — R531 Weakness: Secondary | ICD-10-CM | POA: Diagnosis present

## 2019-08-13 DIAGNOSIS — D509 Iron deficiency anemia, unspecified: Secondary | ICD-10-CM | POA: Diagnosis not present

## 2019-08-13 DIAGNOSIS — F0391 Unspecified dementia with behavioral disturbance: Secondary | ICD-10-CM | POA: Diagnosis not present

## 2019-08-13 DIAGNOSIS — Z79899 Other long term (current) drug therapy: Secondary | ICD-10-CM | POA: Diagnosis not present

## 2019-08-13 DIAGNOSIS — W19XXXA Unspecified fall, initial encounter: Secondary | ICD-10-CM | POA: Diagnosis present

## 2019-08-13 DIAGNOSIS — N390 Urinary tract infection, site not specified: Secondary | ICD-10-CM

## 2019-08-13 LAB — COMPREHENSIVE METABOLIC PANEL
ALT: 10 U/L (ref 0–44)
AST: 13 U/L — ABNORMAL LOW (ref 15–41)
Albumin: 3.4 g/dL — ABNORMAL LOW (ref 3.5–5.0)
Alkaline Phosphatase: 73 U/L (ref 38–126)
Anion gap: 10 (ref 5–15)
BUN: 14 mg/dL (ref 8–23)
CO2: 24 mmol/L (ref 22–32)
Calcium: 8.9 mg/dL (ref 8.9–10.3)
Chloride: 104 mmol/L (ref 98–111)
Creatinine, Ser: 0.71 mg/dL (ref 0.44–1.00)
GFR calc Af Amer: >60 mL/min (ref >60)
GFR calc non Af Amer: >60 mL/min (ref >60)
Glucose, Bld: 105 mg/dL — ABNORMAL HIGH (ref 70–99)
Potassium: 3.8 mmol/L (ref 3.5–5.1)
Sodium: 138 mmol/L (ref 135–145)
Total Bilirubin: 0.7 mg/dL (ref 0.3–1.2)
Total Protein: 6.2 g/dL — ABNORMAL LOW (ref 6.5–8.1)

## 2019-08-13 LAB — URINALYSIS, ROUTINE W REFLEX MICROSCOPIC
Bilirubin Urine: NEGATIVE
Glucose, UA: NEGATIVE mg/dL
Hgb urine dipstick: NEGATIVE
Ketones, ur: NEGATIVE mg/dL
Nitrite: POSITIVE — AB
Protein, ur: NEGATIVE mg/dL
Specific Gravity, Urine: 1.015 (ref 1.005–1.030)
pH: 7 (ref 5.0–8.0)

## 2019-08-13 LAB — CBC WITH DIFFERENTIAL/PLATELET
Abs Immature Granulocytes: 0.03 10*3/uL (ref 0.00–0.07)
Basophils Absolute: 0 10*3/uL (ref 0.0–0.1)
Basophils Relative: 0 %
Eosinophils Absolute: 0.1 10*3/uL (ref 0.0–0.5)
Eosinophils Relative: 1 %
HCT: 34.7 % — ABNORMAL LOW (ref 36.0–46.0)
Hemoglobin: 10.8 g/dL — ABNORMAL LOW (ref 12.0–15.0)
Immature Granulocytes: 0 %
Lymphocytes Relative: 22 %
Lymphs Abs: 2 10*3/uL (ref 0.7–4.0)
MCH: 28.8 pg (ref 26.0–34.0)
MCHC: 31.1 g/dL (ref 30.0–36.0)
MCV: 92.5 fL (ref 80.0–100.0)
Monocytes Absolute: 1 10*3/uL (ref 0.1–1.0)
Monocytes Relative: 12 %
Neutro Abs: 5.7 10*3/uL (ref 1.7–7.7)
Neutrophils Relative %: 65 %
Platelets: 355 10*3/uL (ref 150–400)
RBC: 3.75 MIL/uL — ABNORMAL LOW (ref 3.87–5.11)
RDW: 14 % (ref 11.5–15.5)
WBC: 9 10*3/uL (ref 4.0–10.5)
nRBC: 0 % (ref 0.0–0.2)

## 2019-08-13 LAB — GRAM STAIN

## 2019-08-13 LAB — LACTIC ACID, PLASMA: Lactic Acid, Venous: 0.9 mmol/L (ref 0.5–1.9)

## 2019-08-13 MED ORDER — TRAZODONE HCL 50 MG PO TABS
75.0000 mg | ORAL_TABLET | Freq: Every day | ORAL | Status: DC
  Administered 2019-08-13: 23:00:00 75 mg via ORAL
  Filled 2019-08-13: qty 2

## 2019-08-13 MED ORDER — LEVOTHYROXINE SODIUM 75 MCG PO TABS
75.0000 ug | ORAL_TABLET | Freq: Every day | ORAL | Status: DC
  Administered 2019-08-14: 06:00:00 75 ug via ORAL
  Filled 2019-08-13: qty 1

## 2019-08-13 MED ORDER — ENOXAPARIN SODIUM 40 MG/0.4ML ~~LOC~~ SOLN
40.0000 mg | SUBCUTANEOUS | Status: DC
  Administered 2019-08-13: 23:00:00 40 mg via SUBCUTANEOUS
  Filled 2019-08-13: qty 0.4

## 2019-08-13 MED ORDER — ACETAMINOPHEN 325 MG PO TABS
650.0000 mg | ORAL_TABLET | Freq: Four times a day (QID) | ORAL | Status: DC | PRN
  Administered 2019-08-14: 06:00:00 650 mg via ORAL
  Filled 2019-08-13: qty 2

## 2019-08-13 MED ORDER — ACETAMINOPHEN 650 MG RE SUPP: 650.0000 mg | Freq: Four times a day (QID) | RECTAL | Status: DC | PRN

## 2019-08-13 MED ORDER — SODIUM CHLORIDE 0.9 % IV SOLN
1.0000 g | Freq: Once | INTRAVENOUS | Status: AC
  Administered 2019-08-13: 20:00:00 1 g via INTRAVENOUS
  Filled 2019-08-13: qty 10

## 2019-08-13 MED ORDER — VENLAFAXINE HCL ER 75 MG PO CP24
75.0000 mg | ORAL_CAPSULE | Freq: Every day | ORAL | Status: DC
  Administered 2019-08-14: 11:00:00 75 mg via ORAL
  Filled 2019-08-13 (×2): qty 1

## 2019-08-13 MED ORDER — ALPRAZOLAM 0.5 MG PO TABS
0.5000 mg | ORAL_TABLET | Freq: Two times a day (BID) | ORAL | Status: DC
  Administered 2019-08-13 – 2019-08-14 (×2): 0.5 mg via ORAL
  Filled 2019-08-13: qty 1
  Filled 2019-08-13: qty 2

## 2019-08-13 MED ORDER — PANTOPRAZOLE SODIUM 40 MG PO TBEC
40.0000 mg | DELAYED_RELEASE_TABLET | Freq: Every day | ORAL | Status: DC
  Administered 2019-08-14: 11:00:00 40 mg via ORAL
  Filled 2019-08-13: qty 1

## 2019-08-13 NOTE — H&P (Addendum)
Date: 08/13/2019               Patient Name:  Gina Hodges MRN: [id number]  DOB: Feb 13, 1927 Age / Sex: 85 y.o., female   PCP: Patient, No Pcp Per         Medical Service: Internal Medicine Teaching Service         Attending Physician: Dr. Carmin Muskrat, MD    First Contact: Dr. Ronnald Ramp  Pager: [phone]  Second Contact: Dr. Eileen Stanford  Pager: [phone]       After Hours (After 5p/  First Contact Pager: [phone]  weekends / holidays): Second Contact Pager: [phone]   Chief Complaint: Fall   History of Present Illness: Patient is extremely hard of hearing and is unable to respond to questions. History was obtained through chart review and phone call with her nephew and Gina Hodges [phone]).   [name] is a 85 year old female with a past medical history significant for dementia, bilateral hearing loss, CVA, GERD, HLD who presented to the ED after sustaining a fall at her residence, [facility]. The patient reportedly slid/fell out of her chair in the bathroom. She did not hit her head or lose consciousness. Out of an abundance of caution, the home living staff had the patient transported to the hospital for evaluation. The patient reportedly had new onset A. fib, which flipped back into NSR on arrival.  In the ED, a CBC, CMP, lactate, UA, urine and blood cultures were drawn. Only notable lab was a urinalysis had positive nitrites, moderate leukocytes and many bacteria.  CT head showed no intracranial bleeds. CT of the C-spine showed no acute injuries.  EKG was normal sinus rhythm.  Meds:  Current Meds  Medication Sig   acetaminophen (TYLENOL) 650 MG CR tablet Take 650 mg by mouth 3 (three) times daily.    ALPRAZolam (XANAX) 0.5 MG tablet Take 0.5 mg by mouth 2 (two) times daily.   alum & mag hydroxide-simeth (MAALOX/MYLANTA) 200-200-20 MG/5ML suspension Take 30 mLs by mouth every 8 (eight) hours as needed for indigestion or heartburn.    atorvastatin  (LIPITOR) 10 MG tablet Take 5 mg by mouth at bedtime.    Calcium Carbonate-Vitamin D 600-400 MG-UNIT tablet Take 1 tablet by mouth 2 (two) times daily.    donepezil (ARICEPT) 10 MG tablet Take 10 mg by mouth at bedtime.    fluconazole (DIFLUCAN) 150 MG tablet Take 150 mg by mouth See admin instructions. Take 150 mg by mouth once a day on Tuesdays and Saturdays for yeast- until 08/17/2019   levothyroxine (SYNTHROID, LEVOTHROID) 75 MCG tablet Take 75 mcg by mouth daily before breakfast.    Lidocaine (SALONPAS PAIN RELIEVING) 4 % PTCH Apply 1 patch topically See admin instructions. Apply 1 patch to affected area(s) two times a day and remove, per schedule   loperamide (IMODIUM) 2 MG capsule Take 2-4 mg by mouth See admin instructions. Take 2 capsules (4 mg) by mouth at first loose stool, then take 1 capsule (2 mg) every 2 hours as needed for diarrhea/loose stools   loratadine (CLARITIN) 10 MG tablet Take 10 mg by mouth daily.   Melatonin 10 MG TABS Take by mouth.   meloxicam (MOBIC) 15 MG tablet Take 15 mg by mouth daily.   Multiple Vitamin (MULTIVITAMIN WITH MINERALS) TABS tablet Take 1 tablet by mouth daily.    nystatin (NYSTATIN) powder Apply topically See admin instructions. Apply to both breasts two times a day for 14  days- for yeast   omeprazole (PRILOSEC) 20 MG capsule Take 20 mg by mouth 2 (two) times daily.   polyethylene glycol (MIRALAX / GLYCOLAX) packet Take 17 g by mouth daily as needed for mild constipation (MIX AND DRINK).    senna (SENOKOT) 8.6 MG TABS tablet Take 1 tablet by mouth daily.   traMADol (ULTRAM) 50 MG tablet Take 1 tablet by mouth every 6 hours as needed for pain. (Patient taking differently: Take 50 mg by mouth 2 (two) times daily. )   traZODone (DESYREL) 150 MG tablet Take 75 mg by mouth at bedtime.   venlafaxine XR (EFFEXOR-XR) 75 MG 24 hr capsule Take 75 mg by mouth daily with breakfast.   Allergies: Allergies as of 08/13/2019 - Review Complete 08/13/2019  Allergen  Reaction Noted   Penicillins Other (See Comments) 02/19/2016   Bactrim [sulfamethoxazole-trimethoprim] Other (See Comments) 02/16/2019   Sulfa antibiotics Other (See Comments) 04/02/2013   Codeine Palpitations 04/02/2013   Past Medical History:  Diagnosis Date   Anemia    Anxiety    Cerebral infarct (HCC)    Dementia (HCC)    Dementia with behavioral problem (HCC)    GERD (gastroesophageal reflux disease)    Hyperlipidemia    Osteoporosis    PAF (paroxysmal atrial fibrillation) (HCC)    Thyroid disease    Ulcerative esophagitis    UTI (urinary tract infection)    Vertigo    Family History:  Family History  Problem Relation Age of Onset   Depression Mother    Bipolar disorder Daughter    Drug abuse Son    Social History:   Patient lives at [facility], nephew says she is "antisocial" and likes to keep to herself.  She can feed herself but cannot walk.  Husband died 3 years ago. Pampered her, and family thinks she misses that.  Patient does have hearing aids, but family says she refuses to wear them   Family has only seen her once in the last 6 months due to COVID  Review of Systems: Unable to perform review of systems.  Images: CT Head: IMPRESSION: No acute or traumatic finding by CT. Atrophy and extensive chronic small-vessel ischemic changes throughout the brain as outlined Above.  CT Cervical Spine: IMPRESSION: No acute or traumatic finding. Old minor compression deformity at C7. Mild facet osteoarthritis on the right at C4-5.  EKG: Sinus rhythm Borderline prolonged PR interval Abnormal R-wave progression, early transition  CXR:  IMPRESSION: No active disease in the chest in.  Physical Exam: Blood pressure (!) 142/82, pulse 88, temperature 97.9 F (36.6 C), temperature source Oral, resp. rate 20, SpO2 95 %.  Physical Exam Vitals signs reviewed.  Constitutional:      General: She is not in acute distress.    Appearance: Normal appearance. She is not  ill-appearing, toxic-appearing or diaphoretic.  HENT:     Head: Normocephalic and atraumatic.     Comments: No bruising or any lesions appreciated on the patient's scalp Eyes:     Extraocular Movements: Extraocular movements intact.     Pupils: Pupils are equal, round, and reactive to light.  Neck:     Comments: C-collar in place Cardiovascular:     Rate and Rhythm: Normal rate and regular rhythm.     Pulses: Normal pulses.     Heart sounds: Normal heart sounds. No murmur. No friction rub. No gallop.   Pulmonary:     Effort: Pulmonary effort is normal. No respiratory distress.     Breath  sounds: Normal breath sounds. No wheezing or rales.  Abdominal:     General: Bowel sounds are normal. There is no distension.     Palpations: Abdomen is soft. There is no mass.     Tenderness: There is no abdominal tenderness. There is no guarding.  Genitourinary:    Comments: No suprapubic tenderness Musculoskeletal:     Comments: Moves all extremities antigravity spontaneously  Skin:    General: Skin is warm.  Neurological:     General: No focal deficit present.     Mental Status: She is alert.     Sensory: No sensory deficit.     Comments: Patient oriented to place  Patient intermittently follows commands when she is able to hear it, and will respond appropriately to certain cues  Psychiatric:        Mood and Affect: Mood normal.    Assessment & Plan by Problem: Active Problems:   Fall  In summary, Ms. Cassels is a 85 year old female with a past medical history significant for dementia, bilateral hearing loss, CVA, GERD, HLD who presented with a fall at her home living facility. Patient was reportedly in A. fib but converted to sinus rhythm once she reached the ED.  This may have been the culprit of causing her to fall.  #Fall: According to family, the patient has had multiple falls recently. CT of the head and C-spine did not show any acute injuries.  It was reported that the patient had  new onset atrial fibrillation, but quickly flipped back into sinus rhythm when she arrived to the hospital.  It is possible this arrhythmia contributed to her falling, but it is unclear at this time.  Continuous cardiac monitoring  Remove c-collar  PT/OT consults ordered  CBC, BMP, Iron, TIBC, Ferritin, B12, folate  #UTI?: Urinalysis had positive nitrites, moderate leukocytes and many bacteria. The patient did not show any suprapubic tenderness on exam and denied having burning with urination. She may have asymptomatic bacteria. She received 1 dose of ceftriaxone in the ED.  Follow-up urine cultures  Hold off on antibiotics for now  #Hypothyroidism  Continue home levothyroxine 75 mcg daily TSH ordered  #GERD Continue home Protonix 40 mg daily  #FEN/GI  Diet: Regular, thin liquids  IVF: None  #DVT prophylaxis Lovenox 40 mg injection subq  #CODE STATUS: DNR -confirmed by Kathlen Brunswick   POA - Kathlen Brunswick [phone])  Dispo: Admit patient to Inpatient with expected length of stay greater than 2 midnights.  Signed: Kirt Boys, MD Internal Medicine, PGY1 Pager: [phone]  08/13/2019,9:06 PM

## 2019-08-13 NOTE — ED Triage Notes (Signed)
Pt here from [place] after a fall and new onset afib which she flipped back into SR on arrival , staff at nursing home think pt may have an UTI

## 2019-08-13 NOTE — ED Provider Notes (Signed)
[facility] Provider Note   CSN: [id number] Arrival date & time: 08/13/19  1412     History   Chief Complaint Chief Complaint  Patient presents with   Fall    HPI Gina NoraLora Hodges is a 85 y.o. female.     HPI   Patient presents today after reportedly going into new onset A. fib at her nursing facility [facility].  She reports that she felt generally weak and fell, but she did not hit her head or lose consciousness.  Patient states that she is here in the emergency department because she is "an old woman" and her nursing facility was concerned.  She denies any pain or symptoms currently, but per EMS nursing facility was concerned about a possible UTI.  History is limited as patient has dementia as well as is extremely hard of hearing.  Past Medical History:  Diagnosis Date   Anemia    Anxiety    Cerebral infarct (HCC)    Dementia (HCC)    Dementia with behavioral problem (HCC)    GERD (gastroesophageal reflux disease)    Hyperlipidemia    Osteoporosis    PAF (paroxysmal atrial fibrillation) (HCC)    Thyroid disease    Ulcerative esophagitis    UTI (urinary tract infection)    Vertigo     Patient Active Problem List   Diagnosis Date Noted   Fall 08/13/2019   Syncope 11/17/2018   Syncope and collapse 11/16/2018   Lactic acidosis    Healthcare-associated pneumonia 02/19/2016   SIRS (systemic inflammatory response syndrome) (HCC)    Ingrown nail 11/12/2013   Compression fracture of spine (HCC) 11/07/2013   UTI (urinary tract infection) 11/07/2013   Sternal fracture 11/07/2013   Hypothyroidism 11/07/2013   Hyperlipidemia    Dementia with behavioral problem (HCC)    GERD (gastroesophageal reflux disease)    PAF (paroxysmal atrial fibrillation) (HCC)    Generalized anxiety disorder 04/05/2013    Past Surgical History:  Procedure Laterality Date   ABDOMINAL HYSTERECTOMY      APPENDECTOMY     CHOLECYSTECTOMY       OB History   No obstetric history on file.      Home Medications    Prior to Admission medications   Medication Sig Start Date End Date Taking? Authorizing Provider  acetaminophen (TYLENOL) 650 MG CR tablet Take 650 mg by mouth 3 (three) times daily.    Yes [provider]  ALPRAZolam Prudy Feeler(XANAX) 0.5 MG tablet Take 0.5 mg by mouth 2 (two) times daily.   Yes [provider]  alum & mag hydroxide-simeth (MAALOX/MYLANTA) 200-200-20 MG/5ML suspension Take 30 mLs by mouth every 8 (eight) hours as needed for indigestion or heartburn.    Yes [provider]  atorvastatin (LIPITOR) 10 MG tablet Take 5 mg by mouth at bedtime.  02/27/13  Yes [provider]  Calcium Carbonate-Vitamin D 600-400 MG-UNIT tablet Take 1 tablet by mouth 2 (two) times daily.    Yes [provider]  donepezil (ARICEPT) 10 MG tablet Take 10 mg by mouth at bedtime.  03/22/13  Yes [provider]  fluconazole (DIFLUCAN) 150 MG tablet Take 150 mg by mouth See admin instructions. Take 150 mg by mouth once a day on Tuesdays and Saturdays for yeast- until 08/17/2019 08/14/19 08/17/19 Yes [provider]  levothyroxine (SYNTHROID, LEVOTHROID) 75 MCG tablet Take 75 mcg by mouth daily before breakfast.  02/27/13  Yes [provider]  Lidocaine (  SALONPAS PAIN RELIEVING) 4 % PTCH Apply 1 patch topically See admin instructions. Apply 1 patch to affected area(s) two times a day and remove, per schedule   Yes [provider]  loperamide (IMODIUM) 2 MG capsule Take 2-4 mg by mouth See admin instructions. Take 2 capsules (4 mg) by mouth at first loose stool, then take 1 capsule (2 mg) every 2 hours as needed for diarrhea/loose stools 11/08/18  Yes [provider]  loratadine (CLARITIN) 10 MG tablet Take 10 mg by mouth daily.   Yes [provider]  Melatonin 10 MG TABS Take by mouth.   Yes [provider]    meloxicam (MOBIC) 15 MG tablet Take 15 mg by mouth daily. 10/30/18  Yes [provider]  Multiple Vitamin (MULTIVITAMIN WITH MINERALS) TABS tablet Take 1 tablet by mouth daily.    Yes [provider]  nystatin (NYSTATIN) powder Apply topically See admin instructions. Apply to both breasts two times a day for 14 days- for yeast 08/14/19 08/28/19 Yes [provider]  omeprazole (PRILOSEC) 20 MG capsule Take 20 mg by mouth 2 (two) times daily. 10/17/18  Yes [provider]  polyethylene glycol (MIRALAX / GLYCOLAX) packet Take 17 g by mouth daily as needed for mild constipation ([place]).    Yes [provider]  senna (SENOKOT) 8.6 MG TABS tablet Take 1 tablet by mouth daily.   Yes [provider]  traMADol (ULTRAM) 50 MG tablet Take 1 tablet by mouth every 6 hours as needed for pain. Patient taking differently: Take 50 mg by mouth 2 (two) times daily.  12/18/13  Yes Blanchie Serve, MD  traZODone (DESYREL) 150 MG tablet Take 75 mg by mouth at bedtime. 10/27/18  Yes [provider]  venlafaxine XR (EFFEXOR-XR) 75 MG 24 hr capsule Take 75 mg by mouth daily with breakfast.   Yes [provider]  cephALEXin (KEFLEX) 500 MG capsule Take 1 capsule (500 mg total) by mouth 3 (three) times daily. Patient not taking: Reported on 08/13/2019 02/16/19   Jola Schmidt, MD  mineral oil external liquid Place 2 drops into both ears once a week. Apply 2 drops to both ears once a week on Mondays for wax build-up    [provider]  Skin Protectants, Misc. (EUCERIN) cream Apply 1 application topically at bedtime as needed (itching).    [provider]    Family History Family History  Problem Relation Age of Onset   Depression Mother    Bipolar disorder Daughter    Drug abuse Son     Social History Social History   Tobacco Use   Smoking status: Never Smoker   Smokeless tobacco: Never Used  Substance Use Topics    Alcohol use: No   Drug use: No     Allergies   Penicillins, Bactrim [sulfamethoxazole-trimethoprim], Sulfa antibiotics, and Codeine   Review of Systems Review of Systems  Unable to perform ROS: Dementia     Physical Exam Updated Vital Signs BP (!) 154/81    Pulse 75    Temp 97.9 F (36.6 C) (Oral)    Resp 18    Ht 5' 0.98" (1.549 m)    Wt 73 kg    SpO2 99%    BMI 30.42 kg/m   Physical Exam Vitals signs and nursing note reviewed.  Constitutional:      General: She is not in acute distress.    Appearance: She is well-developed.     Comments: Malodorous,  urinary incontinence  HENT:     Head: Normocephalic and atraumatic.  Eyes:     Extraocular Movements: Extraocular movements intact.     Conjunctiva/sclera: Conjunctivae normal.  Neck:     Musculoskeletal: Neck supple.     Comments: Cervical collar in place Cardiovascular:     Rate and Rhythm: Normal rate and regular rhythm.     Heart sounds: No murmur.  Pulmonary:     Effort: Pulmonary effort is normal. No respiratory distress.     Breath sounds: Normal breath sounds.  Abdominal:     General: There is no distension.     Palpations: Abdomen is soft.     Tenderness: There is no abdominal tenderness. There is no guarding or rebound.     Comments: No peritoneal signs of the abdomen  Skin:    General: Skin is warm and dry.  Neurological:     Mental Status: She is alert.      ED Treatments / Results  Labs (all labs ordered are listed, but only abnormal results are displayed) Labs Reviewed  CBC WITH DIFFERENTIAL/PLATELET - Abnormal; Notable for the following components:      Result Value   RBC 3.75 (*)    Hemoglobin 10.8 (*)    HCT 34.7 (*)    All other components within normal limits  COMPREHENSIVE METABOLIC PANEL - Abnormal; Notable for the following components:   Glucose, Bld 105 (*)    Total Protein 6.2 (*)    Albumin 3.4 (*)    AST 13 (*)    All other components within normal limits  URINALYSIS,  ROUTINE W REFLEX MICROSCOPIC - Abnormal; Notable for the following components:   APPearance HAZY (*)    Nitrite POSITIVE (*)    Leukocytes,Ua MODERATE (*)    Bacteria, UA MANY (*)    Non Squamous Epithelial 0-5 (*)    All other components within normal limits  GRAM STAIN  CULTURE, BLOOD (ROUTINE X 2)  CULTURE, BLOOD (ROUTINE X 2)  URINE CULTURE  LACTIC ACID, PLASMA  CBC  COMPREHENSIVE METABOLIC PANEL  TSH  VITAMIN B12  FERRITIN  IRON AND TIBC  FOLATE RBC    EKG EKG Interpretation  Date/Time:  Monday August 13 2019 17:09:32 EST Ventricular Rate:  84 PR Interval:  214 QRS Duration: 91 QT Interval:  358 QTC Calculation: 424 R Axis:   1 Text Interpretation: Sinus rhythm Borderline prolonged PR interval Abnormal R-wave progression, early transition Artifact Abnormal ECG Confirmed by Gerhard Munch [phone]) on 08/13/2019 5:39:08 PM   Radiology Ct Head Wo Contrast  Result Date: 08/13/2019 CLINICAL DATA:  Fall with head trauma. Dementia. EXAM: CT HEAD WITHOUT CONTRAST TECHNIQUE: Contiguous axial images were obtained from the base of the skull through the vertex without intravenous contrast. COMPARISON:  02/16/2019 FINDINGS: Brain: Age related atrophy. Extensive chronic small-vessel ischemic changes throughout the cerebral hemispheric white matter. No sign of old large vessel territory infarction. Chronic small-vessel changes also affect the basal ganglia, thalami, brainstem and right cerebellum. No mass, hemorrhage, hydrocephalus or extra-axial collection. Vascular: There is atherosclerotic calcification of the major vessels at the base of the brain. Skull: Negative Sinuses/Orbits: Clear/normal Other: None IMPRESSION: No acute or traumatic finding by CT. Atrophy and extensive chronic small-vessel ischemic changes throughout the brain as outlined above. Electronically Signed   By: Paulina Fusi M.D.   On: 08/13/2019 18:59   Ct Cervical Spine Wo Contrast  Result Date:  08/13/2019 CLINICAL DATA:  Dementia. Fall. EXAM: CT CERVICAL SPINE WITHOUT  CONTRAST TECHNIQUE: Multidetector CT imaging of the cervical spine was performed without intravenous contrast. Multiplanar CT image reconstructions were also generated. COMPARISON:  None. FINDINGS: Alignment: Normal Skull base and vertebrae: No evidence of acute fracture. Old minor compression deformity at C7. Soft tissues and spinal canal: Negative Disc levels: No significant degenerative disc disease or facet arthropathy. Mild facet osteoarthritis on the right at C4-5. Upper chest: Negative Other: None IMPRESSION: No acute or traumatic finding. Old minor compression deformity at C7. Mild facet osteoarthritis on the right at C4-5. Electronically Signed   By: Paulina Fusi M.D.   On: 08/13/2019 19:00   Dg Chest Portable 1 View  Result Date: 08/13/2019 CLINICAL DATA:  Evaluate for pneumonia, history of fall and new onset AFib. EXAM: PORTABLE CHEST 1 VIEW COMPARISON:  11/17/2018 FINDINGS: Cardiomediastinal contours are stable. Lungs are clear. Signs of previous midthoracic vertebroplasty in this osteopenic patient. No definite acute osseous findings. IMPRESSION: No active disease in the chest in. Electronically Signed   By: Donzetta Kohut M.D.   On: 08/13/2019 18:20    Procedures Procedures (including critical care time)  Medications Ordered in ED Medications  enoxaparin (LOVENOX) injection 40 mg (has no administration in time range)  acetaminophen (TYLENOL) tablet 650 mg (has no administration in time range)    Or  acetaminophen (TYLENOL) suppository 650 mg (has no administration in time range)  ALPRAZolam (XANAX) tablet 0.5 mg (has no administration in time range)  pantoprazole (PROTONIX) EC tablet 40 mg (has no administration in time range)  traZODone (DESYREL) tablet 75 mg (has no administration in time range)  venlafaxine XR (EFFEXOR-XR) 24 hr capsule 75 mg (has no administration in time range)  levothyroxine (SYNTHROID)  tablet 75 mcg (has no administration in time range)  cefTRIAXone (ROCEPHIN) 1 g in sodium chloride 0.9 % 100 mL IVPB (0 g Intravenous Stopped 08/13/19 2056)     Initial Impression / Assessment and Plan / ED Course  I have reviewed the triage vital signs and the nursing notes.  Pertinent labs & imaging results that were available during my care of the patient were reviewed by me and considered in my medical decision making (see chart for details).        Gina Hodges is a 85 y.o. female with a past medical history of dementia, hyperlipidemia, osteoporosis, recurrent UTIs presents today for generalized weakness and fall at her nursing facility.  Labs and imaging sent for differential diagnosis of intracranial hemorrhage, electrolyte abnormality, anemia, UTI, sepsis.  ECG did not show any acute changes, sinus rhythm present.  No significant with low cytosis, electrolyte abnormality present.  UTI present.  Chest x-ray not show any acute abnormality.    Considering generalized weakness and concern for falling at facility, will admit for further evaluation and management to internal medicine for possible development of urosepsis.  Rocephin given in the ED.  Blood cultures drawn.  Care of patient discussed with the supervising attending.  Final Clinical Impressions(s) / ED Diagnoses   Final diagnoses:  Urinary tract infection without hematuria, site unspecified  Fall, initial encounter    ED Discharge Orders    None       Chester Holstein, MD 08/13/19 2241    Gerhard Munch, MD 08/13/19 2332

## 2019-08-14 DIAGNOSIS — Z8673 Personal history of transient ischemic attack (TIA), and cerebral infarction without residual deficits: Secondary | ICD-10-CM

## 2019-08-14 DIAGNOSIS — K219 Gastro-esophageal reflux disease without esophagitis: Secondary | ICD-10-CM

## 2019-08-14 DIAGNOSIS — Z880 Allergy status to penicillin: Secondary | ICD-10-CM

## 2019-08-14 DIAGNOSIS — D509 Iron deficiency anemia, unspecified: Secondary | ICD-10-CM

## 2019-08-14 DIAGNOSIS — Z79899 Other long term (current) drug therapy: Secondary | ICD-10-CM

## 2019-08-14 DIAGNOSIS — I4891 Unspecified atrial fibrillation: Secondary | ICD-10-CM | POA: Diagnosis not present

## 2019-08-14 DIAGNOSIS — R55 Syncope and collapse: Secondary | ICD-10-CM

## 2019-08-14 DIAGNOSIS — E785 Hyperlipidemia, unspecified: Secondary | ICD-10-CM

## 2019-08-14 DIAGNOSIS — Z9181 History of falling: Secondary | ICD-10-CM

## 2019-08-14 DIAGNOSIS — Z66 Do not resuscitate: Secondary | ICD-10-CM

## 2019-08-14 DIAGNOSIS — E039 Hypothyroidism, unspecified: Secondary | ICD-10-CM

## 2019-08-14 DIAGNOSIS — F411 Generalized anxiety disorder: Secondary | ICD-10-CM

## 2019-08-14 DIAGNOSIS — Z881 Allergy status to other antibiotic agents status: Secondary | ICD-10-CM

## 2019-08-14 DIAGNOSIS — S0990XA Unspecified injury of head, initial encounter: Secondary | ICD-10-CM | POA: Diagnosis not present

## 2019-08-14 DIAGNOSIS — F0391 Unspecified dementia with behavioral disturbance: Secondary | ICD-10-CM | POA: Diagnosis not present

## 2019-08-14 DIAGNOSIS — Z993 Dependence on wheelchair: Secondary | ICD-10-CM

## 2019-08-14 DIAGNOSIS — R8271 Bacteriuria: Secondary | ICD-10-CM

## 2019-08-14 DIAGNOSIS — Z885 Allergy status to narcotic agent status: Secondary | ICD-10-CM

## 2019-08-14 DIAGNOSIS — H9193 Unspecified hearing loss, bilateral: Secondary | ICD-10-CM

## 2019-08-14 DIAGNOSIS — Z79890 Hormone replacement therapy (postmenopausal): Secondary | ICD-10-CM

## 2019-08-14 LAB — MRSA PCR SCREENING: MRSA by PCR: NEGATIVE

## 2019-08-14 LAB — COMPREHENSIVE METABOLIC PANEL
ALT: 11 U/L (ref 0–44)
AST: 17 U/L (ref 15–41)
Albumin: 3.5 g/dL (ref 3.5–5.0)
Alkaline Phosphatase: 76 U/L (ref 38–126)
Anion gap: 12 (ref 5–15)
BUN: 13 mg/dL (ref 8–23)
CO2: 22 mmol/L (ref 22–32)
Calcium: 9.1 mg/dL (ref 8.9–10.3)
Chloride: 103 mmol/L (ref 98–111)
Creatinine, Ser: 0.72 mg/dL (ref 0.44–1.00)
GFR calc Af Amer: >60 mL/min (ref >60)
GFR calc non Af Amer: >60 mL/min (ref >60)
Glucose, Bld: 97 mg/dL (ref 70–99)
Potassium: 3.7 mmol/L (ref 3.5–5.1)
Sodium: 137 mmol/L (ref 135–145)
Total Bilirubin: 0.4 mg/dL (ref 0.3–1.2)
Total Protein: 6.3 g/dL — ABNORMAL LOW (ref 6.5–8.1)

## 2019-08-14 LAB — CBC
HCT: 36.4 % (ref 36.0–46.0)
Hemoglobin: 11.3 g/dL — ABNORMAL LOW (ref 12.0–15.0)
MCH: 29.2 pg (ref 26.0–34.0)
MCHC: 31 g/dL (ref 30.0–36.0)
MCV: 94.1 fL (ref 80.0–100.0)
Platelets: 356 10*3/uL (ref 150–400)
RBC: 3.87 MIL/uL (ref 3.87–5.11)
RDW: 14 % (ref 11.5–15.5)
WBC: 8.6 10*3/uL (ref 4.0–10.5)
nRBC: 0 % (ref 0.0–0.2)

## 2019-08-14 LAB — FERRITIN: Ferritin: 14 ng/mL (ref 11–307)

## 2019-08-14 LAB — TSH: TSH: 1.432 u[IU]/mL (ref 0.350–4.500)

## 2019-08-14 LAB — IRON AND TIBC
Iron: 28 ug/dL (ref 28–170)
Saturation Ratios: 8 % — ABNORMAL LOW (ref 10.4–31.8)
TIBC: 337 ug/dL (ref 250–450)
UIBC: 309 ug/dL

## 2019-08-14 LAB — VITAMIN B12: Vitamin B-12: 683 pg/mL (ref 180–914)

## 2019-08-14 LAB — SARS CORONAVIRUS 2 (TAT 6-24 HRS): SARS Coronavirus 2: NEGATIVE

## 2019-08-14 NOTE — TOC Transition Note (Signed)
Transition of Care [facility]) - CM/SW Discharge Note   Patient Details  Name: Gina Hodges MRN: [id number] Date of Birth: 11/01/1926  [facility]) CM/SW Contact:  Kirstie Peri, [place] Work Phone Number: 08/14/2019, 1:09 PM   Clinical Narrative:    Nurse to call report to [phone].         Patient Goals and CMS Choice        Discharge Placement                       Discharge Plan and Services                                     Social Determinants of Health (SDOH) Interventions     Readmission Risk Interventions No flowsheet data found.

## 2019-08-14 NOTE — TOC Initial Note (Signed)
Transition of Care [facility]) - Initial/Assessment Note    Patient Details  Name: India Jolin MRN: [id number] Date of Birth: 1927-05-10  [facility] CM/SW Contact:    Geralynn Ochs, LCSW Phone Number: 08/14/2019, 4:38 PM  Clinical Narrative:   CSW spoke with patient's nephew, Gwyndolyn Saxon, to update him on patient's discharge back to [facility] today. Nephew in agreement, and appreciative of CSW update.                Expected Discharge Plan: Skilled Nursing Facility Barriers to Discharge: No Barriers Identified   Patient Goals and CMS Choice Patient states their goals for this hospitalization and ongoing recovery are:: patient unable to participate in goal setting      Expected Discharge Plan and Services Expected Discharge Plan: [place] Acute Care Choice: Nursing Home Living arrangements for the past 2 months: [place] Expected Discharge Date: 08/14/19                                    Prior Living Arrangements/Services Living arrangements for the past 2 months: [place] Lives with:: Facility Resident Patient language and need for interpreter reviewed:: No Do you feel safe going back to the place where you live?: Yes      Need for Family Participation in Patient Care: Yes (Comment) Care giver support system in place?: Yes (comment) Current home services: DME Criminal Activity/Legal Involvement Pertinent to Current Situation/Hospitalization: No - Comment as needed  Activities of Daily Living      Permission Sought/Granted Permission sought to share information with : Family Supports, Chartered certified accountant granted to share information with : Yes, Verbal Permission Granted  Share Information with NAME: Gwyndolyn Saxon  Permission granted to share info w AGENCY: [place] granted to share info w Relationship: Nephew/HCPOA     Emotional Assessment    Attitude/Demeanor/Rapport: Unable to Assess Affect (typically observed): Unable to Assess Orientation: : Oriented to Self Alcohol / Substance Use: Not Applicable Psych Involvement: No (comment)  Admission diagnosis:  Fall, initial encounter [W19.XXXA] Urinary tract infection without hematuria, site unspecified [N39.0] Patient Active Problem List   Diagnosis Date Noted  . Fall 08/13/2019  . Ingrown nail 11/12/2013  . Compression fracture of spine ([place]) 11/07/2013  . Hypothyroidism 11/07/2013  . Hyperlipidemia   . Dementia with behavioral problem ([place])   . GERD (gastroesophageal reflux disease)   . PAF (paroxysmal atrial fibrillation) ([place])   . Generalized anxiety disorder 04/05/2013   PCP:  Patient, No Pcp Per Pharmacy:   [place], [place] [address] [place] [address] Phone: [phone] Fax: [place], [place] [address] Phone: [phone] Fax: [phone]     Social Determinants of Health (SDOH) Interventions    Readmission Risk Interventions No flowsheet data found.

## 2019-08-14 NOTE — Evaluation (Signed)
Speech Language Pathology Evaluation Patient Details Name: Gina Hodges MRN: [id number] DOB: 1927-02-14 Today's Date: 08/14/2019 Time: 9242-6834 SLP Time Calculation (min) (ACUTE ONLY): 15 min  Problem List:  Patient Active Problem List   Diagnosis Date Noted  . Fall 08/13/2019  . Ingrown nail 11/12/2013  . Compression fracture of spine ([place]) 11/07/2013  . Hypothyroidism 11/07/2013  . Hyperlipidemia   . Dementia with behavioral problem ([place])   . GERD (gastroesophageal reflux disease)   . PAF (paroxysmal atrial fibrillation) ([place])   . Generalized anxiety disorder 04/05/2013   Past Medical History:  Past Medical History:  Diagnosis Date  . Anemia   . Anxiety   . Cerebral infarct ([place])   . Dementia ([place])   . Dementia with behavioral problem ([place])   . GERD (gastroesophageal reflux disease)   . Hyperlipidemia   . Osteoporosis   . PAF (paroxysmal atrial fibrillation) ([place])   . Thyroid disease   . Ulcerative esophagitis   . UTI (urinary tract infection)   . Vertigo    Past Surgical History:  Past Surgical History:  Procedure Laterality Date  . ABDOMINAL HYSTERECTOMY    . APPENDECTOMY    . CHOLECYSTECTOMY     HPI:  Gina Hodges is a 85 year old female with a past medical history significant for dementia, bilateral hearing loss, CVA, GERD, HLD who presented to the ED after sustaining a fall at her residence, [facility]. CT and x-rays neg. Urinalysis pos for UTI.    Assessment / Plan / Recommendation Clinical Impression  Pt presents with significant hearing loss, impacting participation and understanding. She is able to verbalize that her HA are at the "rest home" and that they no longer work.  She presents with orientation to person, disorientation to elements of time/place/situation.  Follows simple commands in context and names objects.  Has difficulty problem-solving through self-feeding.  Tends to repeat responses to questions despite different prompt.  Speech is  fluent and clear, and social pleasantries are preserved.  Cognition remains characteristic of dementia.  Agree with PT re: SNF level of care.  Pt's D/C is pending.     SLP Assessment  SLP Visit Diagnosis: Cognitive communication deficit (R41.841)    Follow Up Recommendations    24 hour supervision   Frequency and Duration           SLP Evaluation Cognition  Overall Cognitive Status: No family/caregiver present to determine baseline cognitive functioning Arousal/Alertness: Awake/alert Orientation Level: Oriented to person;Disoriented to place;Disoriented to time;Disoriented to situation Attention: Focused Focused Attention: Appears intact Memory: Impaired Memory Impairment: Storage deficit;Retrieval deficit Awareness: Impaired Awareness Impairment: Intellectual impairment Problem Solving: Impaired Problem Solving Impairment: Verbal basic Safety/Judgment: Impaired       Comprehension  Auditory Comprehension Overall Auditory Comprehension: Impaired Yes/No Questions: Impaired Commands: (follows simple commands)    Expression Expression Primary Mode of Expression: Verbal Verbal Expression Overall Verbal Expression: Appears within functional limits for tasks assessed   Oral / Motor  Oral Motor/Sensory Function Overall Oral Motor/Sensory Function: Within functional limits Motor Speech Overall Motor Speech: Appears within functional limits for tasks assessed   GO                    Gina Hodges 08/14/2019, 2:34 PM  Gina Hodges L. Tivis Ringer, [place] Office number [phone] Pager [phone]

## 2019-08-14 NOTE — Progress Notes (Signed)
Arrived from ED. Alert and oriented to self. Denies any pain with written communication.  Follow some commands when able to hear. Redirectable.  Call light within reach.

## 2019-08-14 NOTE — Plan of Care (Signed)
Max assist with adls 

## 2019-08-14 NOTE — Evaluation (Signed)
Physical Therapy Evaluation Patient Details Name: Gina Hodges MRN: [id number] DOB: 04/18/1927 Today's Date: 08/14/2019   History of Present Illness  Ms. Sebree is a 85 year old female with a past medical history significant for dementia, bilateral hearing loss, CVA, GERD, HLD who presented to the ED after sustaining a fall at her residence, [facility]. CT and x-rays neg. Urinalysis pos for UTI.   Clinical Impression  Pt admitted with above diagnosis. Pt following simple commands, though difficult to fully assess cognition due to extreme [facility]. Mod A needed for pt to get to EOB. Mod A +2 for sit<>stand but pt unable to step feet or maintain standing. Pt reported low back pain in sitting after several minutes. Recommend SNF level care at d/c. Pt currently with functional limitations due to the deficits listed below (see PT Problem List). Pt will benefit from skilled PT to increase their independence and safety with mobility to allow discharge to the venue listed below.       Follow Up Recommendations SNF;Supervision/Assistance - 24 hour    Equipment Recommendations  None recommended by PT    Recommendations for Other Services OT consult;Speech consult     Precautions / Restrictions Precautions Precautions: Fall Restrictions Weight Bearing Restrictions: No      Mobility  Bed Mobility Overal bed mobility: Needs Assistance Bed Mobility: Supine to Sit;Sit to Supine     Supine to sit: Mod assist Sit to supine: Mod assist   General bed mobility comments: pt able to initiate getting to EOB, mod A needed at trunk to achieve full, upright sitting. Mod A for LE's back into bed. Pt able to lift hips to scoot in bed  Transfers Overall transfer level: Needs assistance Equipment used: None Transfers: Sit to/from Stand Sit to Stand: Mod assist;+2 physical assistance         General transfer comment: +2 HHA for sit<>stand. Pt with sufficient power for initial stand but then  fatigued very quickly and unable to take steps towards [facility]  Ambulation/Gait             General Gait Details: unable   Stairs            Wheelchair Mobility    Modified Rankin (Stroke Patients Only)       Balance Overall balance assessment: Needs assistance;History of Falls Sitting-balance support: Single extremity supported;Feet supported Sitting balance-Leahy Scale: Fair Sitting balance - Comments: able to maintain static sitting balance but began to have low back discomfort after 5 mins   Standing balance support: Bilateral upper extremity supported Standing balance-Leahy Scale: Poor Standing balance comment: required UE support +2 to maintain standing                             Pertinent Vitals/Pain Pain Assessment: Faces Faces Pain Scale: Hurts even more Pain Location: back pain in sitting Pain Descriptors / Indicators: Aching;Sore Pain Intervention(s): Limited activity within patient's tolerance;Monitored during session    Home Living Family/patient expects to be discharged to:: Skilled nursing facility                      Prior Function Level of Independence: Needs assistance   Gait / Transfers Assistance Needed: pt did not communicate much on eval but did indicate that she walked  ADL's / Homemaking Assistance Needed: assist needed        Hand Dominance        Extremity/Trunk Assessment  Upper Extremity Assessment Upper Extremity Assessment: Defer to OT evaluation;Generalized weakness    Lower Extremity Assessment Lower Extremity Assessment: Generalized weakness    Cervical / Trunk Assessment Cervical / Trunk Assessment: Kyphotic  Communication   Communication: HOH  Cognition Arousal/Alertness: Awake/alert Behavior During Therapy: Flat affect Overall Cognitive Status: No family/caregiver present to determine baseline cognitive functioning                                 General Comments: per  chart is altered from baseline, follows simple commands and answers 50% questions with one word answers      General Comments General comments (skin integrity, edema, etc.): BP supine 163/137, sitting 161/132, HR 91 bpm (RN present and aware of elevated BP)    Exercises     Assessment/Plan    PT Assessment Patient needs continued PT services  PT Problem List Decreased strength;Decreased activity tolerance;Decreased balance;Decreased mobility;Decreased cognition;Decreased knowledge of use of DME;Decreased safety awareness;Decreased knowledge of precautions;Pain;Obesity       PT Treatment Interventions DME instruction;Gait training;Functional mobility training;Therapeutic activities;Therapeutic exercise;Balance training;Neuromuscular re-education;Cognitive remediation;Patient/family education    PT Goals (Current goals can be found in the Care Plan section)  Acute Rehab PT Goals Patient Stated Goal: none stated PT Goal Formulation: With patient Time For Goal Achievement: 08/28/19 Potential to Achieve Goals: Fair    Frequency Min 2X/week   Barriers to discharge        Co-evaluation               AM-PAC PT "6 Clicks" Mobility  Outcome Measure Help needed turning from your back to your side while in a flat bed without using bedrails?: A Lot Help needed moving from lying on your back to sitting on the side of a flat bed without using bedrails?: A Lot Help needed moving to and from a bed to a chair (including a wheelchair)?: Total Help needed standing up from a chair using your arms (e.g., wheelchair or bedside chair)?: Total Help needed to walk in hospital room?: Total Help needed climbing 3-5 steps with a railing? : Total 6 Click Score: 8    End of Session Equipment Utilized During Treatment: Gait belt Activity Tolerance: Patient limited by pain Patient left: in bed;with bed alarm set;with call bell/phone within reach;with nursing/sitter in room Nurse Communication:  Mobility status PT Visit Diagnosis: Muscle weakness (generalized) (M62.81);History of falling (Z91.81);Difficulty in walking, not elsewhere classified (R26.2);Pain Pain - part of body: (low back)    Time: 7371-0626 PT Time Calculation (min) (ACUTE ONLY): 16 min   Charges:   PT Evaluation $PT Eval Moderate Complexity: 1 Mod          Lyanne Co, PT  Acute Rehab Services  Pager [phone] Office [phone]   Lawana Chambers Namira Rosekrans 08/14/2019, 1:14 PM

## 2019-08-14 NOTE — [place] FL2 (Signed)
[place] MEDICAID FL2 LEVEL OF CARE SCREENING TOOL     IDENTIFICATION  Patient Name: Gina Hodges Birthdate: 1927/09/23 Sex: female Admission Date (Current Location): 08/13/2019  [facility] and [place] Number:  Producer, television/film/video and Address:  The [place], [address] [place], [place] [address]      Provider Number: [id number]  Attending Physician Name and Address:  Tyson Alias, *  Relative Name and Phone Number:  Kathlen Brunswick [phone]    Current Level of Care: Hospital Recommended Level of Care: Assisted Living Facility Prior Approval Number:    Date Approved/Denied:   PASRR Number: [id number]  Discharge Plan: Other (Comment)(ALF)    Current Diagnoses: Patient Active Problem List   Diagnosis Date Noted  . Fall 08/13/2019  . Ingrown nail 11/12/2013  . Compression fracture of spine (HCC) 11/07/2013  . Hypothyroidism 11/07/2013  . Hyperlipidemia   . Dementia with behavioral problem (HCC)   . GERD (gastroesophageal reflux disease)   . PAF (paroxysmal atrial fibrillation) (HCC)   . Generalized anxiety disorder 04/05/2013    Orientation RESPIRATION BLADDER Height & Weight     Self  Normal Incontinent Weight: 160 lb 15 oz (73 kg) Height:  5' 0.98" (154.9 cm)  BEHAVIORAL SYMPTOMS/MOOD NEUROLOGICAL BOWEL NUTRITION STATUS      Incontinent Diet(Regular)  AMBULATORY STATUS COMMUNICATION OF NEEDS Skin   Extensive Assist Verbally Skin abrasions(Leg, left, mild.)                       Personal Care Assistance Level of Assistance  Bathing, Feeding, Dressing Bathing Assistance: Maximum assistance Feeding assistance: Limited assistance Dressing Assistance: Maximum assistance     Functional Limitations Info             SPECIAL CARE FACTORS FREQUENCY                       Contractures Contractures Info: Not present    Additional Factors Info  Allergies   Allergies Info: Penicillins, Bactrim, Sulfa  Antibiotics, Codeine           Current Medications (08/14/2019):  This is the current hospital active medication list Current Facility-Administered Medications  Medication Dose Route Frequency Provider Last Rate Last Dose  . acetaminophen (TYLENOL) tablet 650 mg  650 mg Oral Q6H PRN Angelita Ingles, MD   650 mg at 08/14/19 0549   Or  . acetaminophen (TYLENOL) suppository 650 mg  650 mg Rectal Q6H PRN Angelita Ingles, MD      . ALPRAZolam Prudy Feeler) tablet 0.5 mg  0.5 mg Oral BID Angelita Ingles, MD   0.5 mg at 08/14/19 1118  . enoxaparin (LOVENOX) injection 40 mg  40 mg Subcutaneous Q24H Angelita Ingles, MD   40 mg at 08/13/19 2245  . levothyroxine (SYNTHROID) tablet 75 mcg  75 mcg Oral QAC breakfast Angelita Ingles, MD   75 mcg at 08/14/19 0549  . pantoprazole (PROTONIX) EC tablet 40 mg  40 mg Oral Daily Angelita Ingles, MD   40 mg at 08/14/19 1118  . traZODone (DESYREL) tablet 75 mg  75 mg Oral QHS Angelita Ingles, MD   75 mg at 08/13/19 2243  . venlafaxine XR (EFFEXOR-XR) 24 hr capsule 75 mg  75 mg Oral Q breakfast Angelita Ingles, MD   75 mg at 08/14/19 1119     Discharge Medications: STOP taking these medications   cephALEXin 500 MG capsule Commonly  known as: KEFLEX   donepezil 10 MG tablet Commonly known as: ARICEPT   traMADol 50 MG tablet Commonly known as: ULTRAM     TAKE these medications   acetaminophen 650 MG CR tablet Commonly known as: TYLENOL Take 650 mg by mouth 3 (three) times daily.   ALPRAZolam 0.5 MG tablet Commonly known as: XANAX Take 0.5 mg by mouth 2 (two) times daily.   alum & mag hydroxide-simeth 200-200-20 MG/5ML suspension Commonly known as: MAALOX/MYLANTA Take 30 mLs by mouth every 8 (eight) hours as needed for indigestion or heartburn.   atorvastatin 10 MG tablet Commonly known as: LIPITOR Take 5 mg by mouth at bedtime.   Calcium Carbonate-Vitamin D 600-400 MG-UNIT tablet Take 1 tablet by mouth 2 (two) times  daily.   eucerin cream Apply 1 application topically at bedtime as needed (itching).   fluconazole 150 MG tablet Commonly known as: DIFLUCAN Take 150 mg by mouth See admin instructions. Take 150 mg by mouth once a day on Tuesdays and Saturdays for yeast- until 08/17/2019   levothyroxine 75 MCG tablet Commonly known as: SYNTHROID Take 75 mcg by mouth daily before breakfast.   loperamide 2 MG capsule Commonly known as: IMODIUM Take 2-4 mg by mouth See admin instructions. Take 2 capsules (4 mg) by mouth at first loose stool, then take 1 capsule (2 mg) every 2 hours as needed for diarrhea/loose stools   loratadine 10 MG tablet Commonly known as: CLARITIN Take 10 mg by mouth daily.   Melatonin 10 MG Tabs Take by mouth.   meloxicam 15 MG tablet Commonly known as: MOBIC Take 15 mg by mouth daily.   mineral oil light external liquid Place 2 drops into both ears once a week. Apply 2 drops to both ears once a week on Mondays for wax build-up   multivitamin with minerals Tabs tablet Take 1 tablet by mouth daily.   nystatin powder Generic drug: nystatin Apply topically See admin instructions. Apply to both breasts two times a day for 14 days- for yeast   omeprazole 20 MG capsule Commonly known as: PRILOSEC Take 20 mg by mouth 2 (two) times daily.   polyethylene glycol 17 g packet Commonly known as: MIRALAX / GLYCOLAX Take 17 g by mouth daily as needed for mild constipation (MIX AND DRINK).   Salonpas Pain Relieving 4 % Ptch Generic drug: Lidocaine Apply 1 patch topically See admin instructions. Apply 1 patch to affected area(s) two times a day and remove, per schedule   senna 8.6 MG Tabs tablet Commonly known as: SENOKOT Take 1 tablet by mouth daily.   traZODone 150 MG tablet Commonly known as: DESYREL Take 75 mg by mouth at bedtime.   venlafaxine XR 75 MG 24 hr capsule Commonly known as: EFFEXOR-XR Take 75 mg by mouth daily with breakfast.      Relevant Imaging Results:  Relevant Lab Results:   Additional Information KD#:[id number]  Kirstie Peri, [employer]

## 2019-08-14 NOTE — Care Management Obs Status (Signed)
[place] NOTIFICATION   Patient Details  Name: Gina Hodges MRN: [id number] Date of Birth: Dec 25, 1926   Medicare Observation Status Notification Given:  Yes    Geralynn Ochs, LCSW 08/14/2019, 2:36 PM

## 2019-08-14 NOTE — Progress Notes (Signed)
   Subjective: Pt seen at the bedside this morning. Asking about going home upon entering her room. Unable to answer questions appropriately though unclear if due to hearing impairment or dementia. Mental status, though somewhat confused, appears to be at her baseline.  Objective:  Vital signs in last 24 hours: Vitals:   08/14/19 0944 08/14/19 0958 08/14/19 1017 08/14/19 1021  BP: (!) 155/103  (!) 163/137 (!) 161/132  Pulse: 95 98 91 (!) 130  Resp: (!) 21 20 20 18   Temp:      TempSrc:      SpO2: 98% 94% 97% 93%  Weight:      Height:       Physical Exam Vitals signs and nursing note reviewed.  Constitutional:      General: She is not in acute distress.    Appearance: She is not ill-appearing.     Comments: Pt laying comfortable in bed. Asking to go home.  Cardiovascular:     Rate and Rhythm: Normal rate and regular rhythm.     Heart sounds: Normal heart sounds.  Pulmonary:     Effort: Pulmonary effort is normal.  Neurological:     Mental Status: She is alert. She is disoriented.    Assessment/Plan:  Principal Problem:   Fall Active Problems:   Generalized anxiety disorder   Dementia with behavioral problem [facility])  Ms. Yellin presented after being found on the floor at her home in [place]. No history presyncopal chest pain, palpitation, and lightheadedness. Pt's functional status at baseline is wheelchair-bound with significant nursing assistance. Telemetry thus far without bradycardia or arrhthymias. Lab work unremarkable with normal renal function, no anemia, normal TSH, and negative COVID test. No acute findings on CT. UA with positive nitrites, moderate leukocytes, and many bacteria. Though with pt denying urinary symptoms, this is indicative of asymptomatic bacteriuria.   Fall history appears low-risk and likely related to the multiple centrally acting medications the pt is taking (Xanax, trazodone, donepezil, and tramadol). Discontinue Tramadol  and donepezil. Would strongly recommend the Xanax be decreased or tapered off, though will refer to outpatient PCP and discussion with the patient/family. Pt's mental status was at baseline on morning evaluation, so will transfer back to [place] today.  Dispo: Anticipated discharge back to SNF today.  Ladona Horns, MD 08/14/2019, 11:12 AM Pager: [phone]

## 2019-08-14 NOTE — Discharge Summary (Signed)
Name: Gina Hodges MRN: [id number] DOB: [birth year] 85 y.o. PCP: Patient, No Pcp Per  Date of Admission: 08/13/2019  2:14 PM Date of Discharge: 08/14/2019 Attending Physician: Tyson Alias, *  Discharge Diagnosis: 1. Fall 2. Dementia 3. GAD  Discharge Medications: Allergies as of 08/14/2019      Reactions   Penicillins Other (See Comments)   "Allergic," per [facility] Did it involve swelling of the face/tongue/throat, SOB, or low BP? Unk Did it involve sudden or severe rash/hives, skin peeling, or any reaction on the inside of your mouth or nose? Unk Did you need to seek medical attention at a hospital or doctor's office? Unk When did it last happen? Unk If all above answers are "NO", may proceed with cephalosporin use.   Bactrim [sulfamethoxazole-trimethoprim] Other (See Comments)   "Allergic," per [facility]   Sulfa Antibiotics Other (See Comments)   "Allergic," per [facility]   Codeine Palpitations, Other (See Comments)   "Allergic," per [facility]      Medication List    STOP taking these medications   cephALEXin 500 MG capsule Commonly known as: KEFLEX   donepezil 10 MG tablet Commonly known as: ARICEPT   traMADol 50 MG tablet Commonly known as: ULTRAM     TAKE these medications   acetaminophen 650 MG CR tablet Commonly known as: TYLENOL Take 650 mg by mouth 3 (three) times daily.   ALPRAZolam 0.5 MG tablet Commonly known as: XANAX Take 0.5 mg by mouth 2 (two) times daily.   alum & mag hydroxide-simeth 200-200-20 MG/5ML suspension Commonly known as: MAALOX/MYLANTA Take 30 mLs by mouth every 8 (eight) hours as needed for indigestion or heartburn.   atorvastatin 10 MG tablet Commonly known as: LIPITOR Take 5 mg by mouth at bedtime.   Calcium Carbonate-Vitamin D 600-400 MG-UNIT tablet Take 1 tablet by mouth 2 (two) times daily.   eucerin cream Apply 1 application topically at bedtime as needed (itching).   fluconazole 150 MG tablet Commonly known as: DIFLUCAN Take  150 mg by mouth See admin instructions. Take 150 mg by mouth once a day on Tuesdays and Saturdays for yeast- until 08/17/2019   levothyroxine 75 MCG tablet Commonly known as: SYNTHROID Take 75 mcg by mouth daily before breakfast.   loperamide 2 MG capsule Commonly known as: IMODIUM Take 2-4 mg by mouth See admin instructions. Take 2 capsules (4 mg) by mouth at first loose stool, then take 1 capsule (2 mg) every 2 hours as needed for diarrhea/loose stools   loratadine 10 MG tablet Commonly known as: CLARITIN Take 10 mg by mouth daily.   Melatonin 10 MG Tabs Take by mouth.   meloxicam 15 MG tablet Commonly known as: MOBIC Take 15 mg by mouth daily.   mineral oil light external liquid Place 2 drops into both ears once a week. Apply 2 drops to both ears once a week on Mondays for wax build-up   multivitamin with minerals Tabs tablet Take 1 tablet by mouth daily.   nystatin powder Generic drug: nystatin Apply topically See admin instructions. Apply to both breasts two times a day for 14 days- for yeast   omeprazole 20 MG capsule Commonly known as: PRILOSEC Take 20 mg by mouth 2 (two) times daily.   polyethylene glycol 17 g packet Commonly known as: MIRALAX / GLYCOLAX Take 17 g by mouth daily as needed for mild constipation (MIX AND DRINK).   Salonpas Pain Relieving 4 % Ptch Generic drug: Lidocaine Apply 1 patch topically See admin instructions.  Apply 1 patch to affected area(s) two times a day and remove, per schedule   senna 8.6 MG Tabs tablet Commonly known as: SENOKOT Take 1 tablet by mouth daily.   traZODone 150 MG tablet Commonly known as: DESYREL Take 75 mg by mouth at bedtime.   venlafaxine XR 75 MG 24 hr capsule Commonly known as: EFFEXOR-XR Take 75 mg by mouth daily with breakfast.       Disposition and follow-up:   Gina Hodges was discharged from [facility] in Stable condition.  At the hospital follow up visit please address:  1.   FALL - Pt is on several centrally acting medications (Xanax, donepezil, tramadol, and trazodone) which are likely contributing to her multiple falls at home. Tramadol and donepezil were discontinued on discharge due to greater risk with minimal benefit for this pt. Would strongly recommend the Xanax be decreased or tapered off. Recommend this be in discussion with the patient/family given the risks/benefits of this medication and desired comfort measures.    ASYMPTOMATIC BACTERIURIA - UA with positive nitrites, moderate leukocytes, and many bacteria. Pt without any urinary symptoms. Per review of previous UAs, pt appears to be colonized. Would not recommend treatment of her asymptomatic bacteriuria at this time.   IRON DEFICIENCY ANEMIA - Pt with ferritin of 14 and Hgb of 11. Could consider iron replacement in this patient.  2.  Labs / imaging needed at time of follow-up: NONE  3.  Pending labs/ test needing follow-up: NONE  Follow-up Appointments: [facility] Course by problem list: 1. Gina Hodges presented to the ED after a presumed fall at [facility]. No history of syncope or active chest pain/palpitation/lightheadedness. Pt with low functional baseline being wheelchair-bound and needing significant nursing assistance. Telemetry without sustained tachycardia, bradycardia, or arrhthymias. Blood work with normal renal function, no anemia, normal TSH, and negative COVID test. No acute findings on CT bedsides chronic microvascular disease. UA with positive nitrites, moderate leukocytes, and many bacteria. Though with pt denying urinary symptoms, this is indicative of asymptomatic bacteriuria.   Fall history appears low-risk and likely related to the multiple centrally acting medications the pt is taking (Xanax, trazodone, donepezil, and tramadol). Tramadol and donepezil were discontinued at discharge. Would strongly recommend the Xanax be decreased or tapered off, in  discussion with the patient/family given the risks/benefits of this medication and desire for comfort measures given limited life expectancy.  Pt's mental status was at baseline on re-evaluation on 11/10, will transfer back to [place].   Discharge Vitals:   BP (!) 161/132   Pulse (!) 130   Temp 99.8 F (37.7 C) (Oral)   Resp 18   Ht 5' 0.98" (1.549 m)   Wt 73 kg   SpO2 93%   BMI 30.42 kg/m   Pertinent Labs, Studies, and Procedures:  CBC Latest Ref Rng & Units 08/14/2019 08/13/2019 02/16/2019  WBC 4.0 - 10.5 K/uL 8.6 9.0 11.0(H)  Hemoglobin 12.0 - 15.0 g/dL 11.3(L) 10.8(L) 11.0(L)  Hematocrit 36.0 - 46.0 % 36.4 34.7(L) 36.2  Platelets 150 - 400 K/uL 356 355 337   BMP Latest Ref Rng & Units 08/14/2019 08/13/2019 02/16/2019  Glucose 70 - 99 mg/dL 97 161(W) 960(A)  BUN 8 - 23 mg/dL Creatinine 0.44 - 1.00 mg/dL 5.40 9.81 1.91  Sodium 135 - 145 mmol/L 137 138 140  Potassium 3.5 - 5.1 mmol/L 3.7 3.8 3.9  Chloride 98 - 111 mmol/L 103 104 106  CO2 22 - 32 mmol/L 22 24 25   Calcium 8.9 - 10.3 mg/dL 9.1 8.9 9.0   Iron/TIBC/Ferritin/ %Sat    Component Value Date/Time   IRON 28 08/14/2019 0424   TIBC 337 08/14/2019 0424   FERRITIN 14 08/14/2019 0424   IRONPCTSAT 8 (L) 08/14/2019 0424   Lab Results  Component Value Date   TSH 1.432 08/14/2019   VITAMIN B12 - 683  Recent Results (from the past 240 hour(s))  Blood culture (routine x 2)     Status: None (Preliminary result)   Collection Time: 08/13/19  5:40 PM   Specimen: BLOOD  Result Value Ref Range Status   Specimen Description BLOOD RIGHT ANTECUBITAL  Final   Special Requests   Final    BOTTLES DRAWN AEROBIC AND ANAEROBIC Blood Culture adequate volume   Culture   Final    NO GROWTH < 24 HOURS Performed at [facility], [address] [place], [place] [address]    Report Status PENDING  Incomplete  Gram stain     Status: None   Collection Time: 08/13/19  5:41 PM   Specimen: Urine, Catheterized  Result  Value Ref Range Status   Specimen Description URINE, CATHETERIZED  Final   Special Requests NONE  Final   Gram Stain   Final    WBC PRESENT, PREDOMINANTLY PMN GRAM VARIABLE ROD CYTOSPIN SMEAR Performed at [facility], [address] [place], [place] [address]    Report Status 08/13/2019 FINAL  Final  Blood culture (routine x 2)     Status: None (Preliminary result)   Collection Time: 08/13/19  5:48 PM   Specimen: BLOOD RIGHT FOREARM  Result Value Ref Range Status   Specimen Description BLOOD RIGHT FOREARM  Final   Special Requests   Final    AEROBIC BOTTLE ONLY Blood Culture results may not be optimal due to an inadequate volume of blood received in culture bottles   Culture   Final    NO GROWTH < 24 HOURS Performed at [facility], [address] [place], [place] [address]    Report Status PENDING  Incomplete  SARS CORONAVIRUS 2 (TAT 6-24 HRS) Nasopharyngeal Nasopharyngeal Swab     Status: None   Collection Time: 08/14/19  2:26 AM   Specimen: Nasopharyngeal Swab  Result Value Ref Range Status   SARS Coronavirus 2 NEGATIVE NEGATIVE Final    Comment: (NOTE) SARS-CoV-2 target nucleic acids are NOT DETECTED. The SARS-CoV-2 RNA is generally detectable in upper and lower respiratory specimens during the acute phase of infection. Negative results do not preclude SARS-CoV-2 infection, do not rule out co-infections with other pathogens, and should not be used as the sole basis for treatment or other patient management decisions. Negative results must be combined with clinical observations, patient history, and epidemiological information. The expected result is Negative. Fact Sheet for Patients: HairSlick.nohttps://www.fda.gov/media/138098/download Fact Sheet for Healthcare Providers: quierodirigir.comhttps://www.fda.gov/media/138095/download This test is not yet approved or cleared by the [place] FDA and  has been authorized for detection and/or diagnosis of SARS-CoV-2 by FDA under an  Emergency Use Authorization (EUA). This EUA will remain  in effect (meaning this test can be used) for the duration of the COVID-19 declaration under Section 56 4(b)(1) of the Act, 21 U.S.C. section 360bbb-3(b)(1), unless the authorization is terminated or revoked sooner. Performed at [facility], [address] [place], [place] [address]   MRSA PCR Screening     Status: None   Collection  Time: 08/14/19  5:57 AM   Specimen: Nasal Mucosa; Nasopharyngeal  Result Value Ref Range Status   MRSA by PCR NEGATIVE NEGATIVE Final    Comment:        The GeneXpert MRSA Assay (FDA approved for NASAL specimens only), is one component of a comprehensive MRSA colonization surveillance program. It is not intended to diagnose MRSA infection nor to guide or monitor treatment for MRSA infections. Performed at [facility], [place] [address] [place], [place] [address]     CHEST X-RAY 08/13/2019 CLINICAL DATA:  Evaluate for pneumonia, history of fall and new onset AFib.  EXAM: PORTABLE CHEST 1 VIEW  COMPARISON:  11/17/2018  FINDINGS: Cardiomediastinal contours are stable. Lungs are clear. Signs of previous midthoracic vertebroplasty in this osteopenic patient. No definite acute osseous findings.  IMPRESSION: No active disease in the chest in.   CT HEAD 08/13/2019 CLINICAL DATA:  Fall with head trauma. Dementia.  EXAM: CT HEAD WITHOUT CONTRAST  TECHNIQUE: Contiguous axial images were obtained from the base of the skull through the vertex without intravenous contrast.  COMPARISON:  02/16/2019  FINDINGS: Brain: Age related atrophy. Extensive chronic small-vessel ischemic changes throughout the cerebral hemispheric white matter. No sign of old large vessel territory infarction. Chronic small-vessel changes also affect the basal ganglia, thalami, brainstem and right cerebellum. No mass, hemorrhage, hydrocephalus or extra-axial collection.  Vascular: There is  atherosclerotic calcification of the major vessels at the base of the brain.  Skull: Negative  Sinuses/Orbits: Clear/normal  Other: None  IMPRESSION: No acute or traumatic finding by CT. Atrophy and extensive chronic small-vessel ischemic changes throughout the brain as outlined above.    CT CERVICAL SPINE 08/13/2019 CLINICAL DATA:  Dementia. Fall.  EXAM: CT CERVICAL SPINE WITHOUT CONTRAST  TECHNIQUE: Multidetector CT imaging of the cervical spine was performed without intravenous contrast. Multiplanar CT image reconstructions were also generated.  COMPARISON:  None.  FINDINGS: Alignment: Normal  Skull base and vertebrae: No evidence of acute fracture. Old minor compression deformity at C7.  Soft tissues and spinal canal: Negative  Disc levels: No significant degenerative disc disease or facet arthropathy. Mild facet osteoarthritis on the right at C4-5.  Upper chest: Negative  Other: None  IMPRESSION: No acute or traumatic finding. Old minor compression deformity at C7. Mild facet osteoarthritis on the right at C4-5.   Discharge Instructions: Discharge Instructions    Call MD for:  difficulty breathing, headache or visual disturbances   Complete by: As directed    Call MD for:  extreme fatigue   Complete by: As directed    Call MD for:  hives   Complete by: As directed    Call MD for:  persistant dizziness or light-headedness   Complete by: As directed    Call MD for:  persistant nausea and vomiting   Complete by: As directed    Call MD for:  redness, tenderness, or signs of infection (pain, swelling, redness, odor or green/yellow discharge around incision site)   Complete by: As directed    Call MD for:  severe uncontrolled pain   Complete by: As directed    Call MD for:  temperature >100.4   Complete by: As directed    Diet - low sodium heart healthy   Complete by: As directed    Discharge instructions   Complete by: As directed     Gina Hodges were seen in the hospital after a fall. Your exam and imaging were all reassuring that you  do not have any life-threatening injuries.   There is concern that some of the medications you take are contributing to these multiple falls. It is recommended that the Tramadol and Donepezil are stopped. Also, the Xanax should be decreased or tapered off in discussion with your primary physician given the risks/benefits of this medication and desired comfort measures.  Thank you for letting us be a part of your care!   Increase activity slowly   Complete by: As directed       Signed: Thom Chimes, MD 08/14/2019, 11:33 AM   Pager: [phone]

## 2019-08-14 NOTE — Progress Notes (Signed)
Patient left via stretcher in [place]. AVS given

## 2019-08-15 LAB — URINE CULTURE: Culture: >=100000 — AB

## 2019-08-15 LAB — FOLATE RBC
Folate, Hemolysate: 506 ng/mL
Folate, RBC: 1497 ng/mL (ref >498)
Hematocrit: 33.8 % — ABNORMAL LOW (ref 34.0–46.6)

## 2019-08-18 LAB — CULTURE, BLOOD (ROUTINE X 2)
Culture: NO GROWTH
Culture: NO GROWTH
Special Requests: ADEQUATE

## 2019-11-10 ENCOUNTER — Emergency Department (HOSPITAL_COMMUNITY): Payer: Medicare HMO

## 2019-11-10 ENCOUNTER — Inpatient Hospital Stay (HOSPITAL_COMMUNITY)
Admission: EM | Admit: 2019-11-10 | Discharge: 2019-11-13 | DRG: 871 | Disposition: A | Discharge status: Home Health | Payer: Medicare HMO | Source: Skilled Nursing Facility | Attending: Internal Medicine | Admitting: Internal Medicine

## 2019-11-10 ENCOUNTER — Encounter (HOSPITAL_COMMUNITY): Payer: Self-pay | Admitting: Internal Medicine

## 2019-11-10 ENCOUNTER — Other Ambulatory Visit: Payer: Self-pay

## 2019-11-10 DIAGNOSIS — M810 Age-related osteoporosis without current pathological fracture: Secondary | ICD-10-CM | POA: Diagnosis present

## 2019-11-10 DIAGNOSIS — Z79890 Hormone replacement therapy (postmenopausal): Secondary | ICD-10-CM

## 2019-11-10 DIAGNOSIS — Z882 Allergy status to sulfonamides status: Secondary | ICD-10-CM | POA: Diagnosis not present

## 2019-11-10 DIAGNOSIS — Z79899 Other long term (current) drug therapy: Secondary | ICD-10-CM

## 2019-11-10 DIAGNOSIS — F0391 Unspecified dementia with behavioral disturbance: Secondary | ICD-10-CM | POA: Diagnosis present

## 2019-11-10 DIAGNOSIS — H9190 Unspecified hearing loss, unspecified ear: Secondary | ICD-10-CM | POA: Diagnosis present

## 2019-11-10 DIAGNOSIS — Z8701 Personal history of pneumonia (recurrent): Secondary | ICD-10-CM | POA: Diagnosis not present

## 2019-11-10 DIAGNOSIS — N390 Urinary tract infection, site not specified: Secondary | ICD-10-CM | POA: Diagnosis present

## 2019-11-10 DIAGNOSIS — Z20822 Contact with and (suspected) exposure to covid-19: Secondary | ICD-10-CM | POA: Diagnosis present

## 2019-11-10 DIAGNOSIS — N3000 Acute cystitis without hematuria: Secondary | ICD-10-CM | POA: Diagnosis not present

## 2019-11-10 DIAGNOSIS — Z66 Do not resuscitate: Secondary | ICD-10-CM | POA: Diagnosis not present

## 2019-11-10 DIAGNOSIS — Z8673 Personal history of transient ischemic attack (TIA), and cerebral infarction without residual deficits: Secondary | ICD-10-CM | POA: Diagnosis not present

## 2019-11-10 DIAGNOSIS — R6520 Severe sepsis without septic shock: Secondary | ICD-10-CM | POA: Diagnosis present

## 2019-11-10 DIAGNOSIS — F0390 Unspecified dementia without behavioral disturbance: Secondary | ICD-10-CM | POA: Diagnosis present

## 2019-11-10 DIAGNOSIS — E039 Hypothyroidism, unspecified: Secondary | ICD-10-CM | POA: Diagnosis present

## 2019-11-10 DIAGNOSIS — Z791 Long term (current) use of non-steroidal anti-inflammatories (NSAID): Secondary | ICD-10-CM

## 2019-11-10 DIAGNOSIS — N179 Acute kidney failure, unspecified: Secondary | ICD-10-CM | POA: Diagnosis present

## 2019-11-10 DIAGNOSIS — A419 Sepsis, unspecified organism: Secondary | ICD-10-CM | POA: Diagnosis present

## 2019-11-10 DIAGNOSIS — Z880 Allergy status to penicillin: Secondary | ICD-10-CM

## 2019-11-10 DIAGNOSIS — Y92129 Unspecified place in nursing home as the place of occurrence of the external cause: Secondary | ICD-10-CM

## 2019-11-10 DIAGNOSIS — E785 Hyperlipidemia, unspecified: Secondary | ICD-10-CM | POA: Diagnosis present

## 2019-11-10 DIAGNOSIS — I480 Paroxysmal atrial fibrillation: Secondary | ICD-10-CM | POA: Diagnosis present

## 2019-11-10 DIAGNOSIS — G9341 Metabolic encephalopathy: Secondary | ICD-10-CM | POA: Diagnosis present

## 2019-11-10 DIAGNOSIS — F411 Generalized anxiety disorder: Secondary | ICD-10-CM | POA: Diagnosis present

## 2019-11-10 DIAGNOSIS — F03918 Unspecified dementia, unspecified severity, with other behavioral disturbance: Secondary | ICD-10-CM | POA: Diagnosis present

## 2019-11-10 DIAGNOSIS — Z885 Allergy status to narcotic agent status: Secondary | ICD-10-CM | POA: Diagnosis not present

## 2019-11-10 DIAGNOSIS — K219 Gastro-esophageal reflux disease without esophagitis: Secondary | ICD-10-CM | POA: Diagnosis present

## 2019-11-10 DIAGNOSIS — W19XXXA Unspecified fall, initial encounter: Secondary | ICD-10-CM

## 2019-11-10 DIAGNOSIS — R1110 Vomiting, unspecified: Secondary | ICD-10-CM

## 2019-11-10 DIAGNOSIS — W050XXA Fall from non-moving wheelchair, initial encounter: Secondary | ICD-10-CM | POA: Diagnosis present

## 2019-11-10 DIAGNOSIS — R531 Weakness: Secondary | ICD-10-CM

## 2019-11-10 LAB — CBC WITH DIFFERENTIAL/PLATELET
Abs Immature Granulocytes: 0.06 10*3/uL (ref 0.00–0.07)
Basophils Absolute: 0.1 10*3/uL (ref 0.0–0.1)
Basophils Relative: 1 %
Eosinophils Absolute: 0.2 10*3/uL (ref 0.0–0.5)
Eosinophils Relative: 1 %
HCT: 38.1 % (ref 36.0–46.0)
Hemoglobin: 11.5 g/dL — ABNORMAL LOW (ref 12.0–15.0)
Immature Granulocytes: 1 %
Lymphocytes Relative: 12 %
Lymphs Abs: 1.5 10*3/uL (ref 0.7–4.0)
MCH: 28 pg (ref 26.0–34.0)
MCHC: 30.2 g/dL (ref 30.0–36.0)
MCV: 92.7 fL (ref 80.0–100.0)
Monocytes Absolute: 0.9 10*3/uL (ref 0.1–1.0)
Monocytes Relative: 7 %
Neutro Abs: 9.8 10*3/uL — ABNORMAL HIGH (ref 1.7–7.7)
Neutrophils Relative %: 78 %
Platelets: 393 10*3/uL (ref 150–400)
RBC: 4.11 MIL/uL (ref 3.87–5.11)
RDW: 14.4 % (ref 11.5–15.5)
WBC: 12.4 10*3/uL — ABNORMAL HIGH (ref 4.0–10.5)
nRBC: 0 % (ref 0.0–0.2)

## 2019-11-10 LAB — RESPIRATORY PANEL BY RT PCR (FLU A&B, COVID)
Influenza A by PCR: NEGATIVE
Influenza B by PCR: NEGATIVE
SARS Coronavirus 2 by RT PCR: NEGATIVE

## 2019-11-10 LAB — TROPONIN I (HIGH SENSITIVITY)
Troponin I (High Sensitivity): 11 ng/L (ref <18)
Troponin I (High Sensitivity): 13 ng/L (ref <18)

## 2019-11-10 LAB — COMPREHENSIVE METABOLIC PANEL
ALT: 12 U/L (ref 0–44)
AST: 20 U/L (ref 15–41)
Albumin: 3.3 g/dL — ABNORMAL LOW (ref 3.5–5.0)
Alkaline Phosphatase: 66 U/L (ref 38–126)
Anion gap: 9 (ref 5–15)
BUN: 24 mg/dL — ABNORMAL HIGH (ref 8–23)
CO2: 25 mmol/L (ref 22–32)
Calcium: 9 mg/dL (ref 8.9–10.3)
Chloride: 104 mmol/L (ref 98–111)
Creatinine, Ser: 1.05 mg/dL — ABNORMAL HIGH (ref 0.44–1.00)
GFR calc Af Amer: 54 mL/min — ABNORMAL LOW (ref >60)
GFR calc non Af Amer: 47 mL/min — ABNORMAL LOW (ref >60)
Glucose, Bld: 120 mg/dL — ABNORMAL HIGH (ref 70–99)
Potassium: 4.3 mmol/L (ref 3.5–5.1)
Sodium: 138 mmol/L (ref 135–145)
Total Bilirubin: 0.5 mg/dL (ref 0.3–1.2)
Total Protein: 6.1 g/dL — ABNORMAL LOW (ref 6.5–8.1)

## 2019-11-10 LAB — APTT: aPTT: 25 seconds (ref 24–36)

## 2019-11-10 LAB — URINALYSIS, ROUTINE W REFLEX MICROSCOPIC
Bilirubin Urine: NEGATIVE
Glucose, UA: NEGATIVE mg/dL
Hgb urine dipstick: NEGATIVE
Ketones, ur: NEGATIVE mg/dL
Nitrite: NEGATIVE
Protein, ur: 30 mg/dL — AB
Specific Gravity, Urine: 1.028 (ref 1.005–1.030)
pH: 5 (ref 5.0–8.0)

## 2019-11-10 LAB — LACTIC ACID, PLASMA
Lactic Acid, Venous: 0.9 mmol/L (ref 0.5–1.9)
Lactic Acid, Venous: 1.1 mmol/L (ref 0.5–1.9)
Lactic Acid, Venous: 1 mmol/L (ref 0.5–1.9)

## 2019-11-10 LAB — PROTIME-INR
INR: 1.1 (ref 0.8–1.2)
Prothrombin Time: 13.9 seconds (ref 11.4–15.2)

## 2019-11-10 LAB — LIPASE, BLOOD: Lipase: 17 U/L (ref 11–51)

## 2019-11-10 MED ORDER — LIDOCAINE 4 % EX PTCH: 1.0000 | MEDICATED_PATCH | CUTANEOUS | Status: DC

## 2019-11-10 MED ORDER — SODIUM CHLORIDE 0.9 % IV SOLN
1.0000 g | Freq: Three times a day (TID) | INTRAVENOUS | Status: DC
  Administered 2019-11-10: 1 g via INTRAVENOUS
  Filled 2019-11-10 (×5): qty 1

## 2019-11-10 MED ORDER — LIDOCAINE 5 % EX PTCH
1.0000 | MEDICATED_PATCH | CUTANEOUS | Status: DC
  Administered 2019-11-10 – 2019-11-13 (×3): 1 via TRANSDERMAL
  Filled 2019-11-10 (×3): qty 1

## 2019-11-10 MED ORDER — IOHEXOL 300 MG/ML  SOLN
80.0000 mL | Freq: Once | INTRAMUSCULAR | Status: AC | PRN
  Administered 2019-11-10: 80 mL via INTRAVENOUS

## 2019-11-10 MED ORDER — LORATADINE 10 MG PO TABS
10.0000 mg | ORAL_TABLET | Freq: Every day | ORAL | Status: DC
  Administered 2019-11-11 – 2019-11-13 (×3): 10 mg via ORAL
  Filled 2019-11-10 (×3): qty 1

## 2019-11-10 MED ORDER — LEVOTHYROXINE SODIUM 75 MCG PO TABS
75.0000 ug | ORAL_TABLET | Freq: Every day | ORAL | Status: DC
  Administered 2019-11-11 – 2019-11-13 (×2): 75 ug via ORAL
  Filled 2019-11-10 (×3): qty 1

## 2019-11-10 MED ORDER — ENOXAPARIN SODIUM 40 MG/0.4ML ~~LOC~~ SOLN
40.0000 mg | SUBCUTANEOUS | Status: DC
  Administered 2019-11-10: 40 mg via SUBCUTANEOUS
  Filled 2019-11-10: qty 0.4

## 2019-11-10 MED ORDER — PANTOPRAZOLE SODIUM 40 MG PO TBEC
40.0000 mg | DELAYED_RELEASE_TABLET | Freq: Every day | ORAL | Status: DC
  Administered 2019-11-11 – 2019-11-13 (×3): 40 mg via ORAL
  Filled 2019-11-10 (×3): qty 1

## 2019-11-10 MED ORDER — TRAZODONE HCL 50 MG PO TABS
75.0000 mg | ORAL_TABLET | Freq: Every day | ORAL | Status: DC
  Administered 2019-11-10 – 2019-11-12 (×3): 75 mg via ORAL
  Filled 2019-11-10 (×3): qty 2

## 2019-11-10 MED ORDER — ALPRAZOLAM 0.5 MG PO TABS
0.5000 mg | ORAL_TABLET | Freq: Two times a day (BID) | ORAL | Status: DC
  Administered 2019-11-10 – 2019-11-13 (×6): 0.5 mg via ORAL
  Filled 2019-11-10 (×6): qty 1

## 2019-11-10 MED ORDER — SODIUM CHLORIDE 0.9 % IV SOLN: 1.0000 g | Freq: Once | INTRAVENOUS | Status: DC

## 2019-11-10 MED ORDER — SODIUM CHLORIDE 0.9 % IV BOLUS
1000.0000 mL | Freq: Once | INTRAVENOUS | Status: AC
  Administered 2019-11-10: 1000 mL via INTRAVENOUS

## 2019-11-10 MED ORDER — VENLAFAXINE HCL ER 75 MG PO CP24
75.0000 mg | ORAL_CAPSULE | Freq: Every day | ORAL | Status: DC
  Administered 2019-11-11 – 2019-11-13 (×3): 75 mg via ORAL
  Filled 2019-11-10 (×4): qty 1

## 2019-11-10 MED ORDER — SODIUM CHLORIDE 0.9 % IV SOLN
2.0000 g | Freq: Two times a day (BID) | INTRAVENOUS | Status: DC
  Administered 2019-11-11: 2 g via INTRAVENOUS
  Filled 2019-11-10 (×2): qty 2

## 2019-11-10 MED ORDER — ACETAMINOPHEN 325 MG PO TABS
650.0000 mg | ORAL_TABLET | Freq: Three times a day (TID) | ORAL | Status: DC
  Administered 2019-11-10 – 2019-11-13 (×7): 650 mg via ORAL
  Filled 2019-11-10 (×8): qty 2

## 2019-11-10 MED ORDER — ATORVASTATIN CALCIUM 10 MG PO TABS
5.0000 mg | ORAL_TABLET | Freq: Every day | ORAL | Status: DC
  Administered 2019-11-10 – 2019-11-12 (×3): 5 mg via ORAL
  Filled 2019-11-10 (×3): qty 1

## 2019-11-10 MED ORDER — POLYETHYLENE GLYCOL 3350 17 G PO PACK
17.0000 g | PACK | Freq: Every day | ORAL | Status: DC | PRN
  Administered 2019-11-11: 17 g via ORAL
  Filled 2019-11-10: qty 1

## 2019-11-10 MED ORDER — CALCIUM CARBONATE-VITAMIN D 600-400 MG-UNIT PO TABS: 1.0000 | ORAL_TABLET | Freq: Two times a day (BID) | ORAL | Status: DC

## 2019-11-10 MED ORDER — SENNA 8.6 MG PO TABS
1.0000 | ORAL_TABLET | Freq: Every day | ORAL | Status: DC
  Administered 2019-11-11 – 2019-11-13 (×3): 8.6 mg via ORAL
  Filled 2019-11-10 (×3): qty 1

## 2019-11-10 MED ORDER — ADULT MULTIVITAMIN W/MINERALS CH
1.0000 | ORAL_TABLET | Freq: Every day | ORAL | Status: DC
  Administered 2019-11-11 – 2019-11-13 (×3): 1 via ORAL
  Filled 2019-11-10 (×3): qty 1

## 2019-11-10 MED ORDER — CALCIUM CARBONATE-VITAMIN D 500-200 MG-UNIT PO TABS
1.0000 | ORAL_TABLET | Freq: Two times a day (BID) | ORAL | Status: DC
  Administered 2019-11-11 – 2019-11-13 (×6): 1 via ORAL
  Filled 2019-11-10 (×6): qty 1

## 2019-11-10 MED ORDER — MELATONIN 3 MG PO TABS
9.0000 mg | ORAL_TABLET | Freq: Every day | ORAL | Status: DC
  Administered 2019-11-10 – 2019-11-12 (×3): 9 mg via ORAL
  Filled 2019-11-10 (×4): qty 3

## 2019-11-10 NOTE — ED Triage Notes (Signed)
Pt arrives with [facility] from [facility] facility c/o being lethargic the past 1-2 days, per the facilty, per EMS. The pt was found on the floor upon EMS arrival; facility denies pt falling and reports pt had a controlled slide onto the floor. Pt was recently dx'd with pneumonia and is taking doxycycline. Per EMS, pt was in afib, with max heart rate of 150; pt converted back to SR upon arrival to ED at 84 bpm. No hx of afib per facility. Pt has dementia and is at her baseline. 200 ml NS bolus given by EMS.  EMS vitals:  140/86 CBG 127 96% O2 on RA RR 20

## 2019-11-10 NOTE — ED Notes (Signed)
Gave pt sprite to drink and she tolerated it well

## 2019-11-10 NOTE — Progress Notes (Signed)
Ordering provider messaged about blood cultures and normal lactic acid done in the ED. Pt also had fluids given in ED. Asked if he still wanted to complete sepsis protocol. He messaged back that the protocol could be halted. Have asked him to DC the order for sepsis. Elink will continue to monitor until MD discontinues the order.

## 2019-11-10 NOTE — ED Notes (Signed)
Pt transported to CT ?

## 2019-11-10 NOTE — Progress Notes (Signed)
NEW ADMISSION NOTE New Admission Note:   Arrival Method: stretcher from ED  Mental Orientation: alert to self only  Telemetry: box [id number]  Assessment: Completed Skin: see skin assessment  IV: right hand  Pain:denies  Tubes:none  Safety Measures: Safety Fall Prevention Plan has been discussed  Admission: Completed [facility] Orientation: Patient has been orientated to the room, unit and staff.  Family: none  Orders have been reviewed and implemented. Will continue to monitor the patient. Call light has been placed within reach and bed alarm has been activated.   Leonia Reeves, RN

## 2019-11-10 NOTE — ED Provider Notes (Signed)
[facility] EMERGENCY DEPARTMENT Provider Note   CSN: [id number] Arrival date & time: 11/10/19  1042     History Chief Complaint  Patient presents with  . Weakness  . Atrial Fibrillation    Gina Hodges is a 86 y.o. female.  HPI   86 year old female with history of anemia, anxiety, cerebral infarct, dementia, GERD, hyperlipidemia, osteoporosis, paroxysmal atrial fibrillation, thyroid disease, ulcerative esophagitis, UTI, vertigo, who presents the emergency department today for evaluation of altered mental status and an episode of atrial fibrillation.  Level 5 caveat as patient has a history of dementia and there is also a communication difficulty as the patient is very hard of hearing. She is able to provide some limited hx and c/o abd Hodges, nausea and vomiting. Denies cp, sob.  Per EMS, patient is coming from [facility].  They were all called out due to patient being "lethargic "for the past several days.  Upon their arrival patient was found on the floor.  Facility denied any falls and states that she had a controlled slide to the floor.  Per facility, patient was recently diagnosed with pneumonia and is taking doxycycline.  She was found to be in atrial fibrillation with a maximum heart rate of 150.  She converted back to sinus rhythm upon arrival to the ED and after being given 200 cc normal saline. On review of MAR pt is on levaquin and doxycycline.   2:53 PM Discussed case with nursing staff who states that pt currently has pneumonia. Staff states she was somewhat weak this morning. After breakfast she vomited a few times and then fell out of her wheelchair and hit her head.   Past Medical History:  Diagnosis Date  . Anemia   . Anxiety   . Cerebral infarct ([place])   . Dementia ([place])   . Dementia with behavioral problem ([place])   . GERD (gastroesophageal reflux disease)   . Hyperlipidemia   . Osteoporosis   . PAF (paroxysmal atrial fibrillation)  ([place])   . Thyroid disease   . Ulcerative esophagitis   . UTI (urinary tract infection)   . Vertigo     Patient Active Problem List   Diagnosis Date Noted  . Fall 08/13/2019  . Ingrown nail 11/12/2013  . Compression fracture of spine ([place]) 11/07/2013  . Hypothyroidism 11/07/2013  . Hyperlipidemia   . Dementia with behavioral problem ([place])   . GERD (gastroesophageal reflux disease)   . PAF (paroxysmal atrial fibrillation) ([place])   . Generalized anxiety disorder 04/05/2013    Past Surgical History:  Procedure Laterality Date  . ABDOMINAL HYSTERECTOMY    . APPENDECTOMY    . CHOLECYSTECTOMY       OB History   No obstetric history on file.     Family History  Problem Relation Age of Onset  . Depression Mother   . Bipolar disorder Daughter   . Drug abuse Son     Social History   Tobacco Use  . Smoking status: Never Smoker  . Smokeless tobacco: Never Used  Substance Use Topics  . Alcohol use: No  . Drug use: No    Home Medications Prior to Admission medications   Medication Sig Start Date End Date Taking? Authorizing Provider  acetaminophen (TYLENOL) 650 MG CR tablet Take 650 mg by mouth 3 (three) times daily.    Yes [provider]  ALPRAZolam Duanne Moron) 0.5 MG tablet Take 0.5 mg by mouth 2 (two) times daily.   Yes [provider]  alum & mag hydroxide-simeth (MAALOX/MYLANTA) 200-200-20 MG/5ML suspension Take 30 mLs by mouth every 8 (eight) hours as needed for indigestion or heartburn.    Yes [provider]  atorvastatin (LIPITOR) 10 MG tablet Take 5 mg by mouth at bedtime.  02/27/13  Yes [provider]  Calcium Carbonate-Vitamin D 600-400 MG-UNIT tablet Take 1 tablet by mouth 2 (two) times daily.    Yes [provider]  doxycycline (VIBRAMYCIN) 100 MG capsule Take 100 mg by mouth 2 (two) times daily. 11/05/19  Yes [provider]  levofloxacin (LEVAQUIN) 750 MG tablet Take 750 mg by mouth daily. 11/05/19  Yes [provider]  levothyroxine (SYNTHROID, LEVOTHROID) 75 MCG tablet Take 75 mcg by mouth daily before breakfast.  02/27/13  Yes [provider]  Lidocaine (SALONPAS Hodges RELIEVING) 4 % PTCH Apply 1 patch topically See admin instructions. Apply 1 patch to affected area(s) two times a day and remove, per schedule   Yes [provider]  loperamide (IMODIUM) 2 MG capsule Take 2-4 mg by mouth See admin instructions. Take 2 capsules (4 mg) by mouth at first loose stool, then take 1 capsule (2 mg) every 2 hours as needed for diarrhea/loose stools 11/08/18  Yes [provider]  loratadine (CLARITIN) 10 MG tablet Take 10 mg by mouth daily.   Yes [provider]  Melatonin 10 MG TABS Take 10 mg by mouth at bedtime.    Yes [provider]  meloxicam (MOBIC) 15 MG tablet Take 15 mg by mouth daily. 10/30/18  Yes [provider]  Multiple Vitamin (MULTIVITAMIN WITH MINERALS) TABS tablet Take 1 tablet by mouth daily.    Yes [provider]  omeprazole (PRILOSEC) 20 MG capsule Take 20 mg by mouth 2 (two) times daily. 10/17/18  Yes [provider]  polyethylene glycol (MIRALAX / GLYCOLAX) packet Take 17 g by mouth daily as needed for mild constipation (MIX AND DRINK).    Yes [provider]  senna (SENOKOT) 8.6 MG TABS tablet Take 1 tablet by mouth daily.   Yes [provider]  traZODone (DESYREL) 150 MG tablet Take 75 mg by mouth at bedtime. 10/27/18  Yes [provider]  venlafaxine XR (EFFEXOR-XR) 75 MG 24 hr capsule Take 75 mg by mouth daily with breakfast.   Yes [provider]    Allergies    Penicillins, Bactrim [sulfamethoxazole-trimethoprim], Sulfa antibiotics, and Codeine  Review of Systems   Review of Systems  Unable to perform ROS: Mental status change (h/o dementia)  Respiratory: Negative for shortness of breath.   Cardiovascular: Negative for chest Hodges.  Gastrointestinal: Positive for abdominal  Hodges and vomiting.    Physical Exam Updated Vital Signs BP 109/83   Pulse 92   Temp 98.2 F (36.8 C) (Oral)   Resp 20   SpO2 98%   Physical Exam Vitals and nursing note reviewed.  Constitutional:      General: She is not in acute distress.    Appearance: She is well-developed.  HENT:     Head: Normocephalic and atraumatic.  Eyes:     Conjunctiva/sclera: Conjunctivae normal.  Cardiovascular:     Rate and Rhythm: Normal rate and regular rhythm.     Pulses: Normal pulses.     Heart sounds: Normal heart sounds. No murmur.  Pulmonary:     Effort: Pulmonary effort is normal. No respiratory distress.     Breath sounds: Normal breath sounds. No wheezing, rhonchi or rales.  Abdominal:  General: Bowel sounds are normal.     Palpations: Abdomen is soft.     Tenderness: There is abdominal tenderness (RLQ, RUQ). There is guarding. There is no rebound.  Musculoskeletal:        General: No swelling.     Cervical back: Neck supple.  Skin:    General: Skin is warm and dry.  Neurological:     Mental Status: She is alert.     Comments: Mental Status:  Alert, oriented to self, place, but not date or situation. Pleasantly demented. Can follow simple commands.  Cranial Nerves:  II:  Peripheral visual fields grossly normal, pupils equal, round, reactive to light III,IV, VI: ptosis not present, extra-ocular motions intact bilaterally  V,VII: smile symmetric, facial light touch sensation equal VIII: hearing grossly normal to voice  X: uvula elevates symmetrically  XI: bilateral shoulder shrug symmetric and strong XII: midline tongue extension without fassiculations Motor:  Normal tone. 5/5 strength of BUE and BLE major muscle groups including strong and equal grip strength and dorsiflexion/plantar flexion Sensory: light touch normal in all extremities. DTRs: biceps and achilles 2+ symmetric b/l Cerebellar: normal finger-to-nose with bilateral upper extremities Gait: normal gait and  balance. Able to walk on toes and heels with ease.  CV: 2+ radial and DP/PT pulses     ED Results / Procedures / Treatments   Labs (all labs ordered are listed, but only abnormal results are displayed) Labs Reviewed  CBC WITH DIFFERENTIAL/PLATELET - Abnormal; Notable for the following components:      Result Value   WBC 12.4 (*)    Hemoglobin 11.5 (*)    Neutro Abs 9.8 (*)    All other components within normal limits  COMPREHENSIVE METABOLIC PANEL - Abnormal; Notable for the following components:   Glucose, Bld 120 (*)    BUN 24 (*)    Creatinine, Ser 1.05 (*)    Total Protein 6.1 (*)    Albumin 3.3 (*)    GFR calc non Af Amer 47 (*)    GFR calc Af Amer 54 (*)    All other components within normal limits  URINALYSIS, ROUTINE W REFLEX MICROSCOPIC - Abnormal; Notable for the following components:   Color, Urine AMBER (*)    APPearance HAZY (*)    Protein, ur 30 (*)    Leukocytes,Ua LARGE (*)    Bacteria, UA RARE (*)    Non Squamous Epithelial 0-5 (*)    All other components within normal limits  RESPIRATORY PANEL BY RT PCR (FLU A&B, COVID)  LIPASE, BLOOD  TROPONIN I (HIGH SENSITIVITY)  TROPONIN I (HIGH SENSITIVITY)    EKG EKG Interpretation  Date/Time:  Saturday November 10 2019 10:43:18 EST Ventricular Rate:  87 PR Interval:    QRS Duration: 91 QT Interval:  351 QTC Calculation: 423 R Axis:   -24 Text Interpretation: Sinus rhythm Atrial premature complexes Consider left atrial enlargement Borderline left axis deviation Low voltage, precordial leads Confirmed by Tilden Fossa [phone]) on 11/10/2019 10:58:02 AM   Radiology CT Head Wo Contrast  Result Date: 11/10/2019 CLINICAL DATA:  Patient found down today. Lethargy for 1-2 days. Initial encounter. EXAM: CT HEAD WITHOUT CONTRAST CT CERVICAL SPINE WITHOUT CONTRAST TECHNIQUE: Multidetector CT imaging of the head and cervical spine was performed following the standard protocol without intravenous contrast. Multiplanar  CT image reconstructions of the cervical spine were also generated. COMPARISON:  Head and cervical spine CT scans 08/13/2019. FINDINGS: CT HEAD FINDINGS Brain: No evidence of acute infarction, hemorrhage,  hydrocephalus, extra-axial collection or mass lesion/mass effect. Atrophy and extensive chronic microvascular ischemic change are again seen. Vascular: No hyperdense vessel or unexpected calcification. Skull: Intact.  No focal lesion. Sinuses/Orbits: Negative. Other: None. CT CERVICAL SPINE FINDINGS Alignment: Maintained. Skull base and vertebrae: No acute fracture. No primary bone lesion or focal pathologic process. Mild, remote superior endplate compression fracture of C7 is unchanged. Soft tissues and spinal canal: Negative. Disc levels:  Intervertebral disc space height is maintained. Upper chest: Negative. Other: None. IMPRESSION: No acute abnormality head or cervical spine. Atrophy and extensive chronic microvascular ischemic change. Electronically Signed   By: Drusilla Kanner M.D.   On: 11/10/2019 12:56   CT Cervical Spine Wo Contrast  Result Date: 11/10/2019 CLINICAL DATA:  Patient found down today. Lethargy for 1-2 days. Initial encounter. EXAM: CT HEAD WITHOUT CONTRAST CT CERVICAL SPINE WITHOUT CONTRAST TECHNIQUE: Multidetector CT imaging of the head and cervical spine was performed following the standard protocol without intravenous contrast. Multiplanar CT image reconstructions of the cervical spine were also generated. COMPARISON:  Head and cervical spine CT scans 08/13/2019. FINDINGS: CT HEAD FINDINGS Brain: No evidence of acute infarction, hemorrhage, hydrocephalus, extra-axial collection or mass lesion/mass effect. Atrophy and extensive chronic microvascular ischemic change are again seen. Vascular: No hyperdense vessel or unexpected calcification. Skull: Intact.  No focal lesion. Sinuses/Orbits: Negative. Other: None. CT CERVICAL SPINE FINDINGS Alignment: Maintained. Skull base and vertebrae:  No acute fracture. No primary bone lesion or focal pathologic process. Mild, remote superior endplate compression fracture of C7 is unchanged. Soft tissues and spinal canal: Negative. Disc levels:  Intervertebral disc space height is maintained. Upper chest: Negative. Other: None. IMPRESSION: No acute abnormality head or cervical spine. Atrophy and extensive chronic microvascular ischemic change. Electronically Signed   By: Drusilla Kanner M.D.   On: 11/10/2019 12:56   CT ABDOMEN PELVIS W CONTRAST  Result Date: 11/10/2019 CLINICAL DATA:  Right upper quadrant and lower quadrant Hodges. EXAM: CT ABDOMEN AND PELVIS WITH CONTRAST TECHNIQUE: Multidetector CT imaging of the abdomen and pelvis was performed using the standard protocol following bolus administration of intravenous contrast. CONTRAST:  13mL OMNIPAQUE IOHEXOL 300 MG/ML  SOLN COMPARISON:  None. FINDINGS: Lower chest: Basilar atelectasis on the right. Hepatobiliary: No suspicious focal abnormality within the liver parenchyma. Mild intra and extrahepatic biliary duct dilatation. Extrahepatic common bile duct measures 8 mm diameter in the head of the pancreas, upper normal for patient age. Nonvisualization of the gallbladder suggest cholecystectomy. Pancreas: No focal mass lesion. No dilatation of the main duct. No intraparenchymal cyst. No peripancreatic edema. Spleen: No splenomegaly. No focal mass lesion. Adrenals/Urinary Tract: No adrenal nodule or mass. 8.2 x 6.1 x 9.1 cm exophytic cyst noted lower pole right kidney. 9 mm hypoattenuating lesion in the upper pole right kidney is too small to characterize but likely a cyst. 16 mm cyst noted upper pole left kidney with adjacent 6 mm subcapsular low-density lesion, also likely a cyst. No evidence for hydroureter. The urinary bladder appears normal for the degree of distention. Stomach/Bowel: Large hiatal hernia. 50-75% of the stomach is in the chest. Duodenum is normally positioned as is the ligament of  Treitz. No small bowel wall thickening. No small bowel dilatation. The terminal ileum is normal. The appendix is not visualized, but there is no edema or inflammation in the region of the cecum. No gross colonic mass. No colonic wall thickening. Vascular/Lymphatic: There is abdominal aortic atherosclerosis without aneurysm. There is no gastrohepatic or hepatoduodenal ligament lymphadenopathy. No retroperitoneal  or mesenteric lymphadenopathy. No pelvic sidewall lymphadenopathy. Reproductive: Hysterectomy.  There is no adnexal mass. Other: No intraperitoneal free fluid. Musculoskeletal: Bones are diffusely demineralized. No worrisome lytic or sclerotic osseous abnormality. Status post vertebral augmentation at T8 and T12. Superior endplate compression deformity noted at T11. IMPRESSION: 1. No acute findings in the abdomen or pelvis. 2. Large hiatal hernia with 50-75% of the stomach in the chest. 3. Mild prominence of the intrahepatic bile ducts with extrahepatic common bile duct measuring upper normal for patient age. 4.  Aortic Atherosclerois (ICD10-170.0) Electronically Signed   By: Kennith Center M.D.   On: 11/10/2019 14:42   DG Chest Portable 1 View  Result Date: 11/10/2019 CLINICAL DATA:  Fall. EXAM: PORTABLE CHEST 1 VIEW COMPARISON:  08/13/2019 FINDINGS: 11:31 a.m. The lungs are clear without focal pneumonia, edema, pneumothorax or pleural effusion. Low volumes. Interstitial markings are diffusely coarsened with chronic features. The cardio pericardial silhouette is enlarged. Large hiatal hernia. Patient is status post midthoracic vertebral augmentation. Bones are diffusely demineralized. Telemetry leads overlie the chest. IMPRESSION: Low volumes without acute cardiopulmonary findings. Large hiatal hernia. Electronically Signed   By: Kennith Center M.D.   On: 11/10/2019 11:45    Procedures Procedures (including critical care time)  Medications Ordered in ED Medications  iohexol (OMNIPAQUE) 300 MG/ML  solution 80 mL (80 mLs Intravenous Contrast Given 11/10/19 1409)    ED Course  I have reviewed the triage vital signs and the nursing notes.  Pertinent labs & imaging results that were available during my care of the patient were reviewed by me and considered in my medical decision making (see chart for details).    MDM Rules/Calculators/A&P                      86 year old female presenting for evaluation of generalized weakness, nausea vomiting, and fall.  Patient's vital signs are stable.  Heart with regular rate and rhythm.  Lungs are clear to auscultation bilaterally.  She does have some abdominal tenderness on exam.  Reviewed labs CBC with leukocytosis at 12.4, anemia present at 11.5 which appears stable from prior  - suspect her leukocytosis may be from her previously diagnosed pneumonia which appears to be resolved/resolving on CXR   At shift change, pt pending UA, COVID test, and PO challenge. If results are negative and pt can tolerate PO she is likely safe for d/c back to the facility.   Care transitioned to [facility], PA-C at shift change pending above w/u.   Patient seen in conjunction with Dr. Madilyn Hook who personally evaluated the patient and is in agreement with plan.  Final Clinical Impression(s) / ED Diagnoses Final diagnoses:  Generalized weakness  Non-intractable vomiting, presence of nausea not specified, unspecified vomiting type  Fall, initial encounter    Rx / DC Orders ED Discharge Orders    None       Rayne Du 11/10/19 1528    Tilden Fossa, MD 11/15/19 [phone]

## 2019-11-10 NOTE — Plan of Care (Signed)
  Problem: Safety: Goal: Ability to remain free from injury will improve Outcome: Progressing  Low bed ordered, bed alarm on, pt educated.

## 2019-11-10 NOTE — ED Provider Notes (Signed)
  Physical Exam  BP 109/83   Pulse 92   Temp 98.2 F (36.8 C) (Oral)   Resp 20   SpO2 98%   Physical Exam Constitutional:      Appearance: She is not ill-appearing or toxic-appearing.  HENT:     Head: Normocephalic and atraumatic.     Mouth/Throat:     Mouth: Mucous membranes are moist.  Eyes:     Pupils: Pupils are equal, round, and reactive to light.  Cardiovascular:     Rate and Rhythm: Regular rhythm. Tachycardia present.  Abdominal:     General: Abdomen is flat.     Tenderness: There is abdominal tenderness (suprapubic).  Skin:    General: Skin is warm.  Neurological:     General: No focal deficit present.     Mental Status: She is alert.     Comments: disoriented     ED Course/Procedures   Clinical Course as of Nov 09 2337  Sat Nov 10, 2019  1656 Spoke with hospitalist for admission of this patient who will accept.    [KM]    Clinical Course User Index [KM] Arlyn Dunning, PA-C    Procedures  MDM  Patient care was assumed from [facility] PA due to change of shift.  Please see her note for full HPI and evaluation.  Briefly, this patient is a 86 year old female coming from nursing home for an episode of emesis and falling out of the wheelchair today.  She did strike her head.  Patient is currently taking Levaquin and doxycycline for outpatient treatment of pneumonia.  Work-up here is revealing no pneumonia on chest x-ray.  Normal CT head and neck.  CT scan is largely negative.  She has a mildly elevated white count 12.4 but otherwise unremarkable labs. Patient has had normal vital signs throughout her stay. However, when the patient was signed out to me it seemed her pressure suddenly dropped to 70s/50s.  She was tachy to 122 and appeared to stay in sinus rhythm. A fluid bolus was initiated and septic workup added. It does appear she has a UTI. Since she is on outpatient antibiotics, cefepime for complex UTI was ordered.   Patient BP and pulse responded to  fluids. Lactic acid normal. However, given her age and comorbidity I think patient needs admission for IV antibiotics and observation, especially given that she has been on outpatient antibiotics already. Discussed with Dr.Yao and plan agreed upon for admission   Jeral Pinch 11/10/19 2339    Charlynne Pander, MD 11/11/19 [phone]

## 2019-11-10 NOTE — H&P (Signed)
History and Physical    Gina Hodges FIE:[id number] DOB: November 23, 1926 DOA: 11/10/2019  Referring MD/NP/PA: EDP PCP: Patient, No Pcp Per  Outpatient Specialists:  Patient coming from: Nursing home Chief Complaint: Generalized weakness and fall HPI: Gina Hodges is a 86 y.o. female with medical history significant for but not limited to cerebral infarct, dementia,  paroxysmal atrial fibrillation, and thyroid disease, presenting to the ED with 1 day history of altered mental status, weakness, nausea and vomiting followed by an episode of fall.  Patient was aggressively treated for pneumonia with Levaquin and doxycycline but nursing home.  Patient unable to give any consistent history because of dementia.  ED Course: At the ED patient was hypotensive with systolic of 70 mmHg with leukocytosis and pyuria on urinalysis.  She was given IV fluid bolus of IV antibiotics. She had a battery of imaging studies with CT head, CT neck, CT abdomen and chest x-ray which were all unremarkable without any pneumonic infiltrates noted on x-ray (nursing home gives history of pneumonia on antibiotics)  Hospitalist called for admission for sepsis of urinary source with acute kidney injury  Review of Systems: As per HPI otherwise unable to volunteer   Past Medical History:  Diagnosis Date  . Anemia   . Anxiety   . Cerebral infarct (HCC)   . Dementia (HCC)   . Dementia with behavioral problem (HCC)   . GERD (gastroesophageal reflux disease)   . Hyperlipidemia   . Osteoporosis   . PAF (paroxysmal atrial fibrillation) (HCC)   . Thyroid disease   . Ulcerative esophagitis   . UTI (urinary tract infection)   . Vertigo     Past Surgical History:  Procedure Laterality Date  . ABDOMINAL HYSTERECTOMY    . APPENDECTOMY    . CHOLECYSTECTOMY       reports that she has never smoked. She has never used smokeless tobacco. She reports that she does not drink alcohol or use drugs.  Allergies  Allergen Reactions  .  Penicillins Other (See Comments)    "Allergic," per [facility] Did it involve swelling of the face/tongue/throat, SOB, or low BP? Unk Did it involve sudden or severe rash/hives, skin peeling, or any reaction on the inside of your mouth or nose? Unk Did you need to seek medical attention at a hospital or doctor's office? Unk When did it last happen? Unk If all above answers are "NO", may proceed with cephalosporin use.  . Bactrim [Sulfamethoxazole-Trimethoprim] Other (See Comments)    "Allergic," per [facility]  . Sulfa Antibiotics Other (See Comments)    "Allergic," per [facility]  . Codeine Palpitations and Other (See Comments)    "Allergic," per [facility]    Family History  Problem Relation Age of Onset  . Depression Mother   . Bipolar disorder Daughter   . Drug abuse Son      Prior to Admission medications   Medication Sig Start Date End Date Taking? Authorizing Provider  acetaminophen (TYLENOL) 650 MG CR tablet Take 650 mg by mouth 3 (three) times daily.    Yes [provider]  ALPRAZolam Prudy Feeler) 0.5 MG tablet Take 0.5 mg by mouth 2 (two) times daily.   Yes [provider]  alum & mag hydroxide-simeth (MAALOX/MYLANTA) 200-200-20 MG/5ML suspension Take 30 mLs by mouth every 8 (eight) hours as needed for indigestion or heartburn.    Yes [provider]  atorvastatin (LIPITOR) 10 MG tablet Take 5 mg by mouth at bedtime.  02/27/13  Yes [provider]  Calcium  Carbonate-Vitamin D 600-400 MG-UNIT tablet Take 1 tablet by mouth 2 (two) times daily.    Yes [provider]  doxycycline (VIBRAMYCIN) 100 MG capsule Take 100 mg by mouth 2 (two) times daily. 11/05/19  Yes [provider]  levofloxacin (LEVAQUIN) 750 MG tablet Take 750 mg by mouth daily. 11/05/19  Yes [provider]  levothyroxine (SYNTHROID, LEVOTHROID) 75 MCG tablet Take 75 mcg by mouth daily before breakfast.  02/27/13  Yes [provider]  Lidocaine (SALONPAS PAIN RELIEVING) 4 % PTCH  Apply 1 patch topically See admin instructions. Apply 1 patch to affected area(s) two times a day and remove, per schedule   Yes [provider]  loperamide (IMODIUM) 2 MG capsule Take 2-4 mg by mouth See admin instructions. Take 2 capsules (4 mg) by mouth at first loose stool, then take 1 capsule (2 mg) every 2 hours as needed for diarrhea/loose stools 11/08/18  Yes [provider]  loratadine (CLARITIN) 10 MG tablet Take 10 mg by mouth daily.   Yes [provider]  Melatonin 10 MG TABS Take 10 mg by mouth at bedtime.    Yes [provider]  meloxicam (MOBIC) 15 MG tablet Take 15 mg by mouth daily. 10/30/18  Yes [provider]  Multiple Vitamin (MULTIVITAMIN WITH MINERALS) TABS tablet Take 1 tablet by mouth daily.    Yes [provider]  omeprazole (PRILOSEC) 20 MG capsule Take 20 mg by mouth 2 (two) times daily. 10/17/18  Yes [provider]  polyethylene glycol (MIRALAX / GLYCOLAX) packet Take 17 g by mouth daily as needed for mild constipation ([place]).    Yes [provider]  senna (SENOKOT) 8.6 MG TABS tablet Take 1 tablet by mouth daily.   Yes [provider]  traZODone (DESYREL) 150 MG tablet Take 75 mg by mouth at bedtime. 10/27/18  Yes [provider]  venlafaxine XR (EFFEXOR-XR) 75 MG 24 hr capsule Take 75 mg by mouth daily with breakfast.   Yes [provider]    Physical Exam: Vitals:   11/10/19 1652 11/10/19 1653 11/10/19 1715 11/10/19 1730  BP:   (!) 131/99 (!) 122/42  Pulse: 91 92 93 90  Resp: (!) 28 (!) 22 18 15   Temp:      TempSrc:      SpO2: 99% 100% 100% 99%      Constitutional: NAD, calm, comfortable Vitals:   11/10/19 1652 11/10/19 1653 11/10/19 1715 11/10/19 1730  BP:   (!) 131/99 (!) 122/42  Pulse: 91 92 93 90  Resp: (!) 28 (!) 22 18 15   Temp:      TempSrc:      SpO2: 99% 100% 100% 99%   Eyes: PERRL, lids and conjunctivae normal ENMT: Mucous membranes are  moist. Posterior pharynx clear of any exudate or lesions.Normal dentition.  Neck: normal, supple, no masses, no thyromegaly Respiratory: clear to auscultation bilaterally, no wheezing, no crackles. Normal respiratory effort. No accessory muscle use.  Cardiovascular: Regular rate and rhythm, no murmurs / rubs / gallops. No extremity edema. 2+ pedal pulses. No carotid bruits.  Abdomen: Mild suprapubic tenderness noted, no masses palpated. No hepatosplenomegaly. Bowel sounds positive.  Neurologic: Alert oriented x1, no obvious acute focal deficit  Labs on Admission: I have personally reviewed following labs and imaging studies  CBC: Recent Labs  Lab 11/10/19 1145  WBC 12.4*  NEUTROABS 9.8*  HGB 11.5*  HCT 38.1  MCV 92.7  PLT 332   Basic Metabolic  Panel: Recent Labs  Lab 11/10/19 1145  NA 138  K 4.3  CL 104  CO2 25  GLUCOSE 120*  BUN 24*  CREATININE 1.05*  CALCIUM 9.0   GFR: CrCl cannot be calculated (Unknown ideal weight.). Liver Function Tests: Recent Labs  Lab 11/10/19 1145  AST 20  ALT 12  ALKPHOS 66  BILITOT 0.5  PROT 6.1*  ALBUMIN 3.3*   Recent Labs  Lab 11/10/19 1145  LIPASE 17   No results for input(s): AMMONIA in the last 168 hours. Coagulation Profile: No results for input(s): INR, PROTIME in the last 168 hours. Cardiac Enzymes: No results for input(s): CKTOTAL, CKMB, CKMBINDEX, TROPONINI in the last 168 hours. BNP (last 3 results) No results for input(s): PROBNP in the last 8760 hours. HbA1C: No results for input(s): HGBA1C in the last 72 hours. CBG: No results for input(s): GLUCAP in the last 168 hours. Lipid Profile: No results for input(s): CHOL, HDL, LDLCALC, TRIG, CHOLHDL, LDLDIRECT in the last 72 hours. Thyroid Function Tests: No results for input(s): TSH, T4TOTAL, FREET4, T3FREE, THYROIDAB in the last 72 hours. Anemia Panel: No results for input(s): VITAMINB12, FOLATE, FERRITIN, TIBC, IRON, RETICCTPCT in the last 72 hours. Urine  analysis:    Component Value Date/Time   COLORURINE AMBER (A) 11/10/2019 1500   APPEARANCEUR HAZY (A) 11/10/2019 1500   LABSPEC 1.028 11/10/2019 1500   PHURINE 5.0 11/10/2019 1500   GLUCOSEU NEGATIVE 11/10/2019 1500   HGBUR NEGATIVE 11/10/2019 1500   BILIRUBINUR NEGATIVE 11/10/2019 1500   KETONESUR NEGATIVE 11/10/2019 1500   PROTEINUR 30 (A) 11/10/2019 1500   NITRITE NEGATIVE 11/10/2019 1500   LEUKOCYTESUR LARGE (A) 11/10/2019 1500   Sepsis Labs: @LABRCNTIP (procalcitonin:4,lacticidven:4) ) Recent Results (from the past 240 hour(s))  Respiratory Panel by RT PCR (Flu A&B, Covid) - Nasopharyngeal Swab     Status: None   Collection Time: 11/10/19  3:00 PM   Specimen: Nasopharyngeal Swab  Result Value Ref Range Status   SARS Coronavirus 2 by RT PCR NEGATIVE NEGATIVE Final    Comment: (NOTE) SARS-CoV-2 target nucleic acids are NOT DETECTED. The SARS-CoV-2 RNA is generally detectable in upper respiratoy specimens during the acute phase of infection. The lowest concentration of SARS-CoV-2 viral copies this assay can detect is 131 copies/mL. A negative result does not preclude SARS-Cov-2 infection and should not be used as the sole basis for treatment or other patient management decisions. A negative result may occur with  improper specimen collection/handling, submission of specimen other than nasopharyngeal swab, presence of viral mutation(s) within the areas targeted by this assay, and inadequate number of viral copies (<131 copies/mL). A negative result must be combined with clinical observations, patient history, and epidemiological information. The expected result is Negative. Fact Sheet for Patients:  01/08/20 Fact Sheet for Healthcare Providers:  https://www.moore.com/ This test is not yet ap proved or cleared by the https://www.young.biz/ FDA and  has been authorized for detection and/or diagnosis of SARS-CoV-2 by FDA under an  Emergency Use Authorization (EUA). This EUA will remain  in effect (meaning this test can be used) for the duration of the COVID-19 declaration under Section 564(b)(1) of the Act, 21 U.S.C. section 360bbb-3(b)(1), unless the authorization is terminated or revoked sooner.    Influenza A by PCR NEGATIVE NEGATIVE Final   Influenza B by PCR NEGATIVE NEGATIVE Final    Comment: (NOTE) The Xpert Xpress SARS-CoV-2/FLU/RSV assay is intended as an aid in  the diagnosis of influenza from Nasopharyngeal swab specimens and  should not  be used as a sole basis for treatment. Nasal washings and  aspirates are unacceptable for Xpert Xpress SARS-CoV-2/FLU/RSV  testing. Fact Sheet for Patients: https://www.moore.com/ Fact Sheet for Healthcare Providers: https://www.young.biz/ This test is not yet approved or cleared by the [place] FDA and  has been authorized for detection and/or diagnosis of SARS-CoV-2 by  FDA under an Emergency Use Authorization (EUA). This EUA will remain  in effect (meaning this test can be used) for the duration of the  Covid-19 declaration under Section 564(b)(1) of the Act, 21  U.S.C. section 360bbb-3(b)(1), unless the authorization is  terminated or revoked. Performed at [facility], [address] [place], [place] [address]      Radiological Exams on Admission: CT Head Wo Contrast  Result Date: 11/10/2019 CLINICAL DATA:  Patient found down today. Lethargy for 1-2 days. Initial encounter. EXAM: CT HEAD WITHOUT CONTRAST CT CERVICAL SPINE WITHOUT CONTRAST TECHNIQUE: Multidetector CT imaging of the head and cervical spine was performed following the standard protocol without intravenous contrast. Multiplanar CT image reconstructions of the cervical spine were also generated. COMPARISON:  Head and cervical spine CT scans 08/13/2019. FINDINGS: CT HEAD FINDINGS Brain: No evidence of acute infarction, hemorrhage, hydrocephalus,  extra-axial collection or mass lesion/mass effect. Atrophy and extensive chronic microvascular ischemic change are again seen. Vascular: No hyperdense vessel or unexpected calcification. Skull: Intact.  No focal lesion. Sinuses/Orbits: Negative. Other: None. CT CERVICAL SPINE FINDINGS Alignment: Maintained. Skull base and vertebrae: No acute fracture. No primary bone lesion or focal pathologic process. Mild, remote superior endplate compression fracture of C7 is unchanged. Soft tissues and spinal canal: Negative. Disc levels:  Intervertebral disc space height is maintained. Upper chest: Negative. Other: None. IMPRESSION: No acute abnormality head or cervical spine. Atrophy and extensive chronic microvascular ischemic change. Electronically Signed   By: Drusilla Kanner M.D.   On: 11/10/2019 12:56   CT Cervical Spine Wo Contrast  Result Date: 11/10/2019 CLINICAL DATA:  Patient found down today. Lethargy for 1-2 days. Initial encounter. EXAM: CT HEAD WITHOUT CONTRAST CT CERVICAL SPINE WITHOUT CONTRAST TECHNIQUE: Multidetector CT imaging of the head and cervical spine was performed following the standard protocol without intravenous contrast. Multiplanar CT image reconstructions of the cervical spine were also generated. COMPARISON:  Head and cervical spine CT scans 08/13/2019. FINDINGS: CT HEAD FINDINGS Brain: No evidence of acute infarction, hemorrhage, hydrocephalus, extra-axial collection or mass lesion/mass effect. Atrophy and extensive chronic microvascular ischemic change are again seen. Vascular: No hyperdense vessel or unexpected calcification. Skull: Intact.  No focal lesion. Sinuses/Orbits: Negative. Other: None. CT CERVICAL SPINE FINDINGS Alignment: Maintained. Skull base and vertebrae: No acute fracture. No primary bone lesion or focal pathologic process. Mild, remote superior endplate compression fracture of C7 is unchanged. Soft tissues and spinal canal: Negative. Disc levels:  Intervertebral disc  space height is maintained. Upper chest: Negative. Other: None. IMPRESSION: No acute abnormality head or cervical spine. Atrophy and extensive chronic microvascular ischemic change. Electronically Signed   By: Drusilla Kanner M.D.   On: 11/10/2019 12:56   CT ABDOMEN PELVIS W CONTRAST  Result Date: 11/10/2019 CLINICAL DATA:  Right upper quadrant and lower quadrant pain. EXAM: CT ABDOMEN AND PELVIS WITH CONTRAST TECHNIQUE: Multidetector CT imaging of the abdomen and pelvis was performed using the standard protocol following bolus administration of intravenous contrast. CONTRAST:  78mL OMNIPAQUE IOHEXOL 300 MG/ML  SOLN COMPARISON:  None. FINDINGS: Lower chest: Basilar atelectasis on the right. Hepatobiliary: No suspicious focal abnormality within the liver parenchyma.  Mild intra and extrahepatic biliary duct dilatation. Extrahepatic common bile duct measures 8 mm diameter in the head of the pancreas, upper normal for patient age. Nonvisualization of the gallbladder suggest cholecystectomy. Pancreas: No focal mass lesion. No dilatation of the main duct. No intraparenchymal cyst. No peripancreatic edema. Spleen: No splenomegaly. No focal mass lesion. Adrenals/Urinary Tract: No adrenal nodule or mass. 8.2 x 6.1 x 9.1 cm exophytic cyst noted lower pole right kidney. 9 mm hypoattenuating lesion in the upper pole right kidney is too small to characterize but likely a cyst. 16 mm cyst noted upper pole left kidney with adjacent 6 mm subcapsular low-density lesion, also likely a cyst. No evidence for hydroureter. The urinary bladder appears normal for the degree of distention. Stomach/Bowel: Large hiatal hernia. 50-75% of the stomach is in the chest. Duodenum is normally positioned as is the ligament of Treitz. No small bowel wall thickening. No small bowel dilatation. The terminal ileum is normal. The appendix is not visualized, but there is no edema or inflammation in the region of the cecum. No gross colonic mass. No  colonic wall thickening. Vascular/Lymphatic: There is abdominal aortic atherosclerosis without aneurysm. There is no gastrohepatic or hepatoduodenal ligament lymphadenopathy. No retroperitoneal or mesenteric lymphadenopathy. No pelvic sidewall lymphadenopathy. Reproductive: Hysterectomy.  There is no adnexal mass. Other: No intraperitoneal free fluid. Musculoskeletal: Bones are diffusely demineralized. No worrisome lytic or sclerotic osseous abnormality. Status post vertebral augmentation at T8 and T12. Superior endplate compression deformity noted at T11. IMPRESSION: 1. No acute findings in the abdomen or pelvis. 2. Large hiatal hernia with 50-75% of the stomach in the chest. 3. Mild prominence of the intrahepatic bile ducts with extrahepatic common bile duct measuring upper normal for patient age. 4.  Aortic Atherosclerois (ICD10-170.0) Electronically Signed   By: Kennith Center M.D.   On: 11/10/2019 14:42   DG Chest Portable 1 View  Result Date: 11/10/2019 CLINICAL DATA:  Fall. EXAM: PORTABLE CHEST 1 VIEW COMPARISON:  08/13/2019 FINDINGS: 11:31 a.m. The lungs are clear without focal pneumonia, edema, pneumothorax or pleural effusion. Low volumes. Interstitial markings are diffusely coarsened with chronic features. The cardio pericardial silhouette is enlarged. Large hiatal hernia. Patient is status post midthoracic vertebral augmentation. Bones are diffusely demineralized. Telemetry leads overlie the chest. IMPRESSION: Low volumes without acute cardiopulmonary findings. Large hiatal hernia. Electronically Signed   By: Kennith Center M.D.   On: 11/10/2019 11:45    EKG: Independently reviewed.   Assessment/Plan Principal Problem:   Sepsis (HCC) Active Problems:   Generalized anxiety disorder   Dementia with behavioral problem (HCC)   PAF (paroxysmal atrial fibrillation) (HCC)   Hypothyroidism   Fall   AKI (acute kidney injury) (HCC)    Sepsis  urinary source Acute hypotension requiring IV fluid  bolus and pyuria with leukocytosis IV antibiotics IV fluids Blood and urine cultures Monitor hemodynamics with telemetry monitoring Antibiotic at this point pericardial results as needed  Acute kidney injury Mild Patient's BUN and creatinine were 13 and 0.7 respectively in November 2020 Admitting BUN and creatinine of 24 and 1.05 respectively IV fluids with monitoring of renal function and electrolytes  Mental status change Improving Likely due to sepsis of UTI Near baseline post IV fluids and antibiotics at the ED  A. fib Stable  Hypothyroidism Continue with thyroid replacement Check TSH  Fall Likely due to sepsis of acute kidney injury PT OT evaluation tomorrow  DVT prophylaxis: Lovenox Code Status: DNR Family Communication:  Disposition Plan: SNF  consults called:  Admission status: (inpatient, tele)    Jackie PlumGeorge Osei-Bonsu MD [facility] Pager [phone]  If 7PM-7AM, please contact night-coverage [email or web address] Password [email or web address]  11/10/2019, 5:42 PM

## 2019-11-10 NOTE — Progress Notes (Signed)
Pharmacy Antibiotic Note  Gina Hodges is a 86 y.o. female admitted on 11/10/2019 with UTI.  Pharmacy has been consulted for aztreonam dosing.  Pt has tolerated multiple doses of cephalosporins including cefepime and ceftriaxone in past, including dose given in ED, will continue cefepime.    Plan: Cefepime 2g IV every 12 hours Monitor renal function, Cx and clinical progression to narrow     Temp (24hrs), Avg:98.2 F (36.8 C), Min:97.8 F (36.6 C), Max:98.6 F (37 C)  Recent Labs  Lab 11/10/19 1145 11/10/19 1550  WBC 12.4*  --   CREATININE 1.05*  --   LATICACIDVEN  --  0.9    CrCl cannot be calculated (Unknown ideal weight.).    Allergies  Allergen Reactions  . Penicillins Other (See Comments)    "Allergic," per [facility] Did it involve swelling of the face/tongue/throat, SOB, or low BP? Unk Did it involve sudden or severe rash/hives, skin peeling, or any reaction on the inside of your mouth or nose? Unk Did you need to seek medical attention at a hospital or doctor's office? Unk When did it last happen? Unk If all above answers are "NO", may proceed with cephalosporin use.  . Bactrim [Sulfamethoxazole-Trimethoprim] Other (See Comments)    "Allergic," per [facility]  . Sulfa Antibiotics Other (See Comments)    "Allergic," per [facility]  . Codeine Palpitations and Other (See Comments)    "Allergic," per [facility]    Daylene Posey, PharmD Clinical Pharmacist Please check AMION for all [facility] numbers 11/10/2019 6:39 PM

## 2019-11-11 DIAGNOSIS — N3000 Acute cystitis without hematuria: Secondary | ICD-10-CM

## 2019-11-11 DIAGNOSIS — R6520 Severe sepsis without septic shock: Secondary | ICD-10-CM

## 2019-11-11 DIAGNOSIS — F0391 Unspecified dementia with behavioral disturbance: Secondary | ICD-10-CM

## 2019-11-11 DIAGNOSIS — A419 Sepsis, unspecified organism: Principal | ICD-10-CM

## 2019-11-11 LAB — COMPREHENSIVE METABOLIC PANEL
ALT: 12 U/L (ref 0–44)
AST: 14 U/L — ABNORMAL LOW (ref 15–41)
Albumin: 3 g/dL — ABNORMAL LOW (ref 3.5–5.0)
Alkaline Phosphatase: 67 U/L (ref 38–126)
Anion gap: 8 (ref 5–15)
BUN: 22 mg/dL (ref 8–23)
CO2: 21 mmol/L — ABNORMAL LOW (ref 22–32)
Calcium: 8.9 mg/dL (ref 8.9–10.3)
Chloride: 109 mmol/L (ref 98–111)
Creatinine, Ser: 0.92 mg/dL (ref 0.44–1.00)
GFR calc Af Amer: >60 mL/min (ref >60)
GFR calc non Af Amer: 55 mL/min — ABNORMAL LOW (ref >60)
Glucose, Bld: 100 mg/dL — ABNORMAL HIGH (ref 70–99)
Potassium: 4 mmol/L (ref 3.5–5.1)
Sodium: 138 mmol/L (ref 135–145)
Total Bilirubin: 0.5 mg/dL (ref 0.3–1.2)
Total Protein: 5.5 g/dL — ABNORMAL LOW (ref 6.5–8.1)

## 2019-11-11 LAB — CBC
HCT: 34.9 % — ABNORMAL LOW (ref 36.0–46.0)
Hemoglobin: 10.8 g/dL — ABNORMAL LOW (ref 12.0–15.0)
MCH: 28.1 pg (ref 26.0–34.0)
MCHC: 30.9 g/dL (ref 30.0–36.0)
MCV: 90.9 fL (ref 80.0–100.0)
Platelets: 331 10*3/uL (ref 150–400)
RBC: 3.84 MIL/uL — ABNORMAL LOW (ref 3.87–5.11)
RDW: 14.4 % (ref 11.5–15.5)
WBC: 8.7 10*3/uL (ref 4.0–10.5)
nRBC: 0 % (ref 0.0–0.2)

## 2019-11-11 LAB — URINE CULTURE: Culture: NO GROWTH

## 2019-11-11 LAB — HEMOGLOBIN A1C
Hgb A1c MFr Bld: 5.9 % — ABNORMAL HIGH (ref 4.8–5.6)
Mean Plasma Glucose: 122.63 mg/dL

## 2019-11-11 MED ORDER — RESOURCE THICKENUP CLEAR PO POWD
ORAL | Status: DC | PRN
  Filled 2019-11-11: qty 125

## 2019-11-11 MED ORDER — SODIUM CHLORIDE 0.9 % IV SOLN
1.0000 g | INTRAVENOUS | Status: DC
  Administered 2019-11-11 – 2019-11-13 (×3): 1 g via INTRAVENOUS
  Filled 2019-11-11 (×3): qty 1

## 2019-11-11 NOTE — Progress Notes (Addendum)
PROGRESS NOTE        PATIENT DETAILS Name: Gina Hodges Age: 86 y.o. Sex: female Date of Birth: 01/02/1927 Admit Date: 11/10/2019 Admitting Physician Jackie Plum, MD [id number], No Pcp Per  Brief Narrative: Patient is a 86 y.o. female dementia, atrial fibrillation, hard of hearing, GERD, history of CVA, HLD-who scented to the ED after she vomited and fell out of her wheelchair, while in the emergency room-she was noted to be hypotensive, more confused than her usual baseline-and thought to have a UTI.  At her SNF-patient was apparently on doxycycline and levofloxacin-for PNA. She was subsequently admitted to the hospitalist service.  Subjective: Lying comfortably in bed-very pleasantly confused.  No major events overnight per RN.  Assessment/Plan: Sepsis with hypotension secondary to UTI POA: Blood pressure has normalized-she seems to have rapidly improved with supportive care and IV antibiotics.  Will narrow down antibiotics to just Rocephin-await culture data but suspect that the cultures will be sterile as she was already on antimicrobial therapy prior to this hospital stay.  Mild AKI: Probably secondary to hypotension in the setting of sepsis.  Resolved with supportive care and IVF. Stop all IVF today.  Acute metabolic encephalopathy superimposed on advanced dementia: Suspect secondary to sepsis/UTI/hypotension-improved with treatment of underlying sepsis.  Improved-likely she is not that far from her usual baseline today  PAF: Sinus rhythm-clearly not a candidate for anticoagulation given advanced age/frailty and fall risk.  Hypothyroidism: Continue levothyroxine  Dementia: Pleasantly confused-at risk for delirium-continue trazodone, Effexor  Mechanical fall: She apparently fell out of her wheelchair while at SNF-obtaining PT eval.  She has advanced frailty from dementia.  Diet: Diet Order            Diet Heart Room service appropriate? Yes; Fluid  consistency: Thin  Diet effective now               DVT Prophylaxis: Prophylactic Lovenox   Code Status:  DNR (confirmed with nephew)  Family Communication: Nephew over the phone  Disposition Plan: SNF when ready for discharge.  Barriers to Discharge: IV antibiotics-awaiting cultures-resolving sepsis   Antimicrobial agents: Anti-infectives (From admission, onward)   Start     Dose/Rate Route Frequency Ordered Stop   11/11/19 0400  ceFEPIme (MAXIPIME) 2 g in sodium chloride 0.9 % 100 mL IVPB     2 g 200 mL/hr over 30 Minutes Intravenous Every 12 hours 11/10/19 1840     11/10/19 1615  ceFEPIme (MAXIPIME) 1 g in sodium chloride 0.9 % 100 mL IVPB  Status:  Discontinued     1 g 200 mL/hr over 30 Minutes Intravenous Every 8 hours 11/10/19 1600 11/10/19 1830   11/10/19 1545  cefTRIAXone (ROCEPHIN) 1 g in sodium chloride 0.9 % 100 mL IVPB  Status:  Discontinued     1 g 200 mL/hr over 30 Minutes Intravenous  Once 11/10/19 1541 11/10/19 1600      Procedures: None  CONSULTS:  None  Time spent: 25- minutes-Greater than 50% of this time was spent in counseling, explanation of diagnosis, planning of further management, and coordination of care.  MEDICATIONS: Scheduled Meds: . acetaminophen  650 mg Oral TID  . ALPRAZolam  0.5 mg Oral BID  . atorvastatin  5 mg Oral QHS  . calcium-vitamin D  1 tablet Oral BID WC  . enoxaparin (LOVENOX) injection  40 mg Subcutaneous Q24H  .  levothyroxine  75 mcg Oral Q0600  . lidocaine  1 patch Transdermal Q24H  . loratadine  10 mg Oral Daily  . Melatonin  9 mg Oral QHS  . multivitamin with minerals  1 tablet Oral Daily  . pantoprazole  40 mg Oral Daily  . senna  1 tablet Oral Daily  . traZODone  75 mg Oral QHS  . venlafaxine XR  75 mg Oral Q breakfast   Continuous Infusions: . ceFEPime (MAXIPIME) IV 2 g (11/11/19 0450)   PRN Meds:.polyethylene glycol   PHYSICAL EXAM: Vital signs: Vitals:   11/10/19 2049 11/10/19 2051 11/11/19  0440 11/11/19 0909  BP: (!) 108/95 112/64 (!) 115/95 118/86  Pulse: (!) 105  80 82  Resp: 19  16 18   Temp: 98.4 F (36.9 C)  98.1 F (36.7 C) 98.4 F (36.9 C)  TempSrc: Oral  Oral Oral  SpO2: 97%  96% 95%  Weight: 71 kg      Filed Weights   11/10/19 2049  Weight: 71 kg   Body mass index is 29.59 kg/m.   Gen Exam: Pleasantly confused HEENT:atraumatic, normocephalic Chest: B/L clear to auscultation anteriorly CVS:S1S2 regular Abdomen:soft non tender, non distended Extremities:no edema Neurology: Seems to be moving all 4 extremities symmetrically-difficult exam given dementia. Skin: no rash  I have personally reviewed following labs and imaging studies  LABORATORY DATA: CBC: Recent Labs  Lab 11/10/19 1145 11/11/19 0625  WBC 12.4* 8.7  NEUTROABS 9.8*  --   HGB 11.5* 10.8*  HCT 38.1 34.9*  MCV 92.7 90.9  PLT 393 331    Basic Metabolic Panel: Recent Labs  Lab 11/10/19 1145 11/11/19 0625  NA 138 138  K 4.3 4.0  CL 104 109  CO2 25 21*  GLUCOSE 120* 100*  BUN 24* 22  CREATININE 1.05* 0.92  CALCIUM 9.0 8.9    GFR: Estimated Creatinine Clearance: 36.6 mL/min (by C-G formula based on SCr of 0.92 mg/dL).  Liver Function Tests: Recent Labs  Lab 11/10/19 1145 11/11/19 0625  AST 20 14*  ALT 12 12  ALKPHOS 66 67  BILITOT 0.5 0.5  PROT 6.1* 5.5*  ALBUMIN 3.3* 3.0*   Recent Labs  Lab 11/10/19 1145  LIPASE 17   No results for input(s): AMMONIA in the last 168 hours.  Coagulation Profile: Recent Labs  Lab 11/10/19 1849  INR 1.1    Cardiac Enzymes: No results for input(s): CKTOTAL, CKMB, CKMBINDEX, TROPONINI in the last 168 hours.  BNP (last 3 results) No results for input(s): PROBNP in the last 8760 hours.  Lipid Profile: No results for input(s): CHOL, HDL, LDLCALC, TRIG, CHOLHDL, LDLDIRECT in the last 72 hours.  Thyroid Function Tests: No results for input(s): TSH, T4TOTAL, FREET4, T3FREE, THYROIDAB in the last 72 hours.  Anemia  Panel: No results for input(s): VITAMINB12, FOLATE, FERRITIN, TIBC, IRON, RETICCTPCT in the last 72 hours.  Urine analysis:    Component Value Date/Time   COLORURINE AMBER (A) 11/10/2019 1500   APPEARANCEUR HAZY (A) 11/10/2019 1500   LABSPEC 1.028 11/10/2019 1500   PHURINE 5.0 11/10/2019 1500   GLUCOSEU NEGATIVE 11/10/2019 1500   HGBUR NEGATIVE 11/10/2019 1500   BILIRUBINUR NEGATIVE 11/10/2019 1500   KETONESUR NEGATIVE 11/10/2019 1500   PROTEINUR 30 (A) 11/10/2019 1500   NITRITE NEGATIVE 11/10/2019 1500   LEUKOCYTESUR LARGE (A) 11/10/2019 1500    Sepsis Labs: Lactic Acid, Venous    Component Value Date/Time   LATICACIDVEN 1.0 11/10/2019 2017    MICROBIOLOGY: Recent Results (from the  past 240 hour(s))  Respiratory Panel by RT PCR (Flu A&B, Covid) - Nasopharyngeal Swab     Status: None   Collection Time: 11/10/19  3:00 PM   Specimen: Nasopharyngeal Swab  Result Value Ref Range Status   SARS Coronavirus 2 by RT PCR NEGATIVE NEGATIVE Final    Comment: (NOTE) SARS-CoV-2 target nucleic acids are NOT DETECTED. The SARS-CoV-2 RNA is generally detectable in upper respiratoy specimens during the acute phase of infection. The lowest concentration of SARS-CoV-2 viral copies this assay can detect is 131 copies/mL. A negative result does not preclude SARS-Cov-2 infection and should not be used as the sole basis for treatment or other patient management decisions. A negative result may occur with  improper specimen collection/handling, submission of specimen other than nasopharyngeal swab, presence of viral mutation(s) within the areas targeted by this assay, and inadequate number of viral copies (<131 copies/mL). A negative result must be combined with clinical observations, patient history, and epidemiological information. The expected result is Negative. Fact Sheet for Patients:  https://www.moore.com/ Fact Sheet for Healthcare Providers:   https://www.young.biz/ This test is not yet ap proved or cleared by the [place] FDA and  has been authorized for detection and/or diagnosis of SARS-CoV-2 by FDA under an Emergency Use Authorization (EUA). This EUA will remain  in effect (meaning this test can be used) for the duration of the COVID-19 declaration under Section 564(b)(1) of the Act, 21 U.S.C. section 360bbb-3(b)(1), unless the authorization is terminated or revoked sooner.    Influenza A by PCR NEGATIVE NEGATIVE Final   Influenza B by PCR NEGATIVE NEGATIVE Final    Comment: (NOTE) The Xpert Xpress SARS-CoV-2/FLU/RSV assay is intended as an aid in  the diagnosis of influenza from Nasopharyngeal swab specimens and  should not be used as a sole basis for treatment. Nasal washings and  aspirates are unacceptable for Xpert Xpress SARS-CoV-2/FLU/RSV  testing. Fact Sheet for Patients: https://www.moore.com/ Fact Sheet for Healthcare Providers: https://www.young.biz/ This test is not yet approved or cleared by the [place] FDA and  has been authorized for detection and/or diagnosis of SARS-CoV-2 by  FDA under an Emergency Use Authorization (EUA). This EUA will remain  in effect (meaning this test can be used) for the duration of the  Covid-19 declaration under Section 564(b)(1) of the Act, 21  U.S.C. section 360bbb-3(b)(1), unless the authorization is  terminated or revoked. Performed at [facility], [address], [place], [place] [address]   Blood culture (routine x 2)     Status: None (Preliminary result)   Collection Time: 11/10/19  3:50 PM   Specimen: BLOOD  Result Value Ref Range Status   Specimen Description BLOOD RIGHT ANTECUBITAL  Final   Special Requests   Final    BOTTLES DRAWN AEROBIC AND ANAEROBIC Blood Culture adequate volume Performed at [facility], [address], [place], [place] [address]    Culture NO GROWTH < 24  HOURS  Final   Report Status PENDING  Incomplete  Blood culture (routine x 2)     Status: None (Preliminary result)   Collection Time: 11/10/19  4:00 PM   Specimen: BLOOD  Result Value Ref Range Status   Specimen Description BLOOD LEFT ANTECUBITAL  Final   Special Requests   Final    BOTTLES DRAWN AEROBIC AND ANAEROBIC Blood Culture adequate volume Performed at [facility], [address], [place], [place] [address]    Culture NO GROWTH < 24 HOURS  Final  Report Status PENDING  Incomplete  Culture, blood (x 2)     Status: None (Preliminary result)   Collection Time: 11/10/19  6:49 PM   Specimen: BLOOD  Result Value Ref Range Status   Specimen Description BLOOD RIGHT ANTECUBITAL  Final   Special Requests   Final    BOTTLES DRAWN AEROBIC AND ANAEROBIC Blood Culture results may not be optimal due to an inadequate volume of blood received in culture bottles   Culture NO GROWTH < 12 HOURS  Final   Report Status PENDING  Incomplete  Culture, blood (x 2)     Status: None (Preliminary result)   Collection Time: 11/10/19  6:57 PM   Specimen: BLOOD LEFT HAND  Result Value Ref Range Status   Specimen Description BLOOD LEFT HAND  Final   Special Requests   Final    BOTTLES DRAWN AEROBIC AND ANAEROBIC Blood Culture results may not be optimal due to an inadequate volume of blood received in culture bottles   Culture NO GROWTH < 12 HOURS  Final   Report Status PENDING  Incomplete    RADIOLOGY STUDIES/RESULTS: CT Head Wo Contrast  Result Date: 11/10/2019 CLINICAL DATA:  Patient found down today. Lethargy for 1-2 days. Initial encounter. EXAM: CT HEAD WITHOUT CONTRAST CT CERVICAL SPINE WITHOUT CONTRAST TECHNIQUE: Multidetector CT imaging of the head and cervical spine was performed following the standard protocol without intravenous contrast. Multiplanar CT image reconstructions of the cervical spine were also generated. COMPARISON:  Head and cervical spine CT scans 08/13/2019. FINDINGS: CT  HEAD FINDINGS Brain: No evidence of acute infarction, hemorrhage, hydrocephalus, extra-axial collection or mass lesion/mass effect. Atrophy and extensive chronic microvascular ischemic change are again seen. Vascular: No hyperdense vessel or unexpected calcification. Skull: Intact.  No focal lesion. Sinuses/Orbits: Negative. Other: None. CT CERVICAL SPINE FINDINGS Alignment: Maintained. Skull base and vertebrae: No acute fracture. No primary bone lesion or focal pathologic process. Mild, remote superior endplate compression fracture of C7 is unchanged. Soft tissues and spinal canal: Negative. Disc levels:  Intervertebral disc space height is maintained. Upper chest: Negative. Other: None. IMPRESSION: No acute abnormality head or cervical spine. Atrophy and extensive chronic microvascular ischemic change. Electronically Signed   By: Inge Rise M.D.   On: 11/10/2019 12:56   CT Cervical Spine Wo Contrast  Result Date: 11/10/2019 CLINICAL DATA:  Patient found down today. Lethargy for 1-2 days. Initial encounter. EXAM: CT HEAD WITHOUT CONTRAST CT CERVICAL SPINE WITHOUT CONTRAST TECHNIQUE: Multidetector CT imaging of the head and cervical spine was performed following the standard protocol without intravenous contrast. Multiplanar CT image reconstructions of the cervical spine were also generated. COMPARISON:  Head and cervical spine CT scans 08/13/2019. FINDINGS: CT HEAD FINDINGS Brain: No evidence of acute infarction, hemorrhage, hydrocephalus, extra-axial collection or mass lesion/mass effect. Atrophy and extensive chronic microvascular ischemic change are again seen. Vascular: No hyperdense vessel or unexpected calcification. Skull: Intact.  No focal lesion. Sinuses/Orbits: Negative. Other: None. CT CERVICAL SPINE FINDINGS Alignment: Maintained. Skull base and vertebrae: No acute fracture. No primary bone lesion or focal pathologic process. Mild, remote superior endplate compression fracture of C7 is  unchanged. Soft tissues and spinal canal: Negative. Disc levels:  Intervertebral disc space height is maintained. Upper chest: Negative. Other: None. IMPRESSION: No acute abnormality head or cervical spine. Atrophy and extensive chronic microvascular ischemic change. Electronically Signed   By: Inge Rise M.D.   On: 11/10/2019 12:56   CT ABDOMEN PELVIS W CONTRAST  Result Date: 11/10/2019 CLINICAL DATA:  Right upper quadrant and lower quadrant pain. EXAM: CT ABDOMEN AND PELVIS WITH CONTRAST TECHNIQUE: Multidetector CT imaging of the abdomen and pelvis was performed using the standard protocol following bolus administration of intravenous contrast. CONTRAST:  51mL OMNIPAQUE IOHEXOL 300 MG/ML  SOLN COMPARISON:  None. FINDINGS: Lower chest: Basilar atelectasis on the right. Hepatobiliary: No suspicious focal abnormality within the liver parenchyma. Mild intra and extrahepatic biliary duct dilatation. Extrahepatic common bile duct measures 8 mm diameter in the head of the pancreas, upper normal for patient age. Nonvisualization of the gallbladder suggest cholecystectomy. Pancreas: No focal mass lesion. No dilatation of the main duct. No intraparenchymal cyst. No peripancreatic edema. Spleen: No splenomegaly. No focal mass lesion. Adrenals/Urinary Tract: No adrenal nodule or mass. 8.2 x 6.1 x 9.1 cm exophytic cyst noted lower pole right kidney. 9 mm hypoattenuating lesion in the upper pole right kidney is too small to characterize but likely a cyst. 16 mm cyst noted upper pole left kidney with adjacent 6 mm subcapsular low-density lesion, also likely a cyst. No evidence for hydroureter. The urinary bladder appears normal for the degree of distention. Stomach/Bowel: Large hiatal hernia. 50-75% of the stomach is in the chest. Duodenum is normally positioned as is the ligament of Treitz. No small bowel wall thickening. No small bowel dilatation. The terminal ileum is normal. The appendix is not visualized, but there  is no edema or inflammation in the region of the cecum. No gross colonic mass. No colonic wall thickening. Vascular/Lymphatic: There is abdominal aortic atherosclerosis without aneurysm. There is no gastrohepatic or hepatoduodenal ligament lymphadenopathy. No retroperitoneal or mesenteric lymphadenopathy. No pelvic sidewall lymphadenopathy. Reproductive: Hysterectomy.  There is no adnexal mass. Other: No intraperitoneal free fluid. Musculoskeletal: Bones are diffusely demineralized. No worrisome lytic or sclerotic osseous abnormality. Status post vertebral augmentation at T8 and T12. Superior endplate compression deformity noted at T11. IMPRESSION: 1. No acute findings in the abdomen or pelvis. 2. Large hiatal hernia with 50-75% of the stomach in the chest. 3. Mild prominence of the intrahepatic bile ducts with extrahepatic common bile duct measuring upper normal for patient age. 4.  Aortic Atherosclerois (ICD10-170.0) Electronically Signed   By: Kennith Center M.D.   On: 11/10/2019 14:42   DG Chest Portable 1 View  Result Date: 11/10/2019 CLINICAL DATA:  Fall. EXAM: PORTABLE CHEST 1 VIEW COMPARISON:  08/13/2019 FINDINGS: 11:31 a.m. The lungs are clear without focal pneumonia, edema, pneumothorax or pleural effusion. Low volumes. Interstitial markings are diffusely coarsened with chronic features. The cardio pericardial silhouette is enlarged. Large hiatal hernia. Patient is status post midthoracic vertebral augmentation. Bones are diffusely demineralized. Telemetry leads overlie the chest. IMPRESSION: Low volumes without acute cardiopulmonary findings. Large hiatal hernia. Electronically Signed   By: Kennith Center M.D.   On: 11/10/2019 11:45     LOS: 1 day   Jeoffrey Massed, MD  [facility]    To contact the attending provider between 7A-7P or the covering provider during after hours 7P-7A, please log into the web site [email or web address] and access using universal [place] password for that web  site. If you do not have the password, please call the hospital operator.  11/11/2019, 11:29 AM

## 2019-11-11 NOTE — Evaluation (Signed)
Physical Therapy Evaluation Patient Details Name: Gina Hodges MRN: [id number] DOB: 1926-10-31 Today's Date: 11/11/2019   History of Present Illness  86 y.o. female dementia, atrial fibrillation, hard of hearing, GERD, history of CVA, and HLD. She presented to the ED from [facility] after she vomited and fell out of her wheelchair, While in the emergency room, she was noted to be hypotensive, more confused than her usual baseline, and thought to have a UTI.    Clinical Impression  Pt admitted with above diagnosis. On eval, pt required mod/max assist bed mobility, mod assist sit to stand and max assist SPT.  Pt is very HOH, but following simple commands consistently. Pt currently with functional limitations due to the deficits listed below (see PT Problem List). Pt will benefit from skilled PT to increase their independence and safety with mobility to allow discharge to the venue listed below.       Follow Up Recommendations SNF    Equipment Recommendations  Other (comment)(defer to next venue)    Recommendations for Other Services       Precautions / Restrictions Precautions Precautions: Fall      Mobility  Bed Mobility Overal bed mobility: Needs Assistance Bed Mobility: Supine to Sit;Sit to Supine     Supine to sit: Max assist;HOB elevated Sit to supine: Mod assist   General bed mobility comments: assist with BLE and to elevate trunk, bed pad to scoot to EOB, increased time to stabilize sitting balance  Transfers Overall transfer level: Needs assistance   Transfers: Sit to/from Stand;Stand Pivot Transfers Sit to Stand: Mod assist Stand pivot transfers: Max assist       General transfer comment: cues for hand placement and sequencing. Pivot transfer bed to [facility]. Stedy utilized for return transfer.  Ambulation/Gait             General Gait Details: nonambulatory at baseline  Stairs            Wheelchair Mobility    Modified Rankin (Stroke  Patients Only)       Balance Overall balance assessment: Needs assistance Sitting-balance support: Feet supported;No upper extremity supported Sitting balance-Leahy Scale: Fair     Standing balance support: Bilateral upper extremity supported;During functional activity Standing balance-Leahy Scale: Poor Standing balance comment: heavy reliance on external support                             Pertinent Vitals/Pain Pain Assessment: Faces Faces Pain Scale: Hurts little more Pain Location: "I hurt all over." Pain Descriptors / Indicators: Discomfort;Sore Pain Intervention(s): Monitored during session;Repositioned    Home Living Family/patient expects to be discharged to:: Skilled nursing facility                      Prior Function Level of Independence: Needs assistance   Gait / Transfers Assistance Needed: wheelchair bound. Unsure of assist level needed for transfers.  ADL's / Homemaking Assistance Needed: assist needed from SNF staff        Hand Dominance        Extremity/Trunk Assessment   Upper Extremity Assessment Upper Extremity Assessment: Defer to OT evaluation    Lower Extremity Assessment Lower Extremity Assessment: Generalized weakness    Cervical / Trunk Assessment Cervical / Trunk Assessment: Kyphotic  Communication   Communication: HOH  Cognition Arousal/Alertness: Awake/alert Behavior During Therapy: Flat affect Overall Cognitive Status: History of cognitive impairments - at baseline  General Comments: Pleasant. Following commands. Dementia at baseline.      General Comments      Exercises     Assessment/Plan    PT Assessment Patient needs continued PT services  PT Problem List Decreased strength;Decreased mobility;Decreased safety awareness;Decreased activity tolerance;Pain;Decreased balance       PT Treatment Interventions Therapeutic activities;DME  instruction;Therapeutic exercise;Patient/family education;Balance training;Functional mobility training    PT Goals (Current goals can be found in the Care Plan section)  Acute Rehab PT Goals Patient Stated Goal: not stated PT Goal Formulation: With patient Time For Goal Achievement: 11/25/19 Potential to Achieve Goals: Fair    Frequency Min 2X/week   Barriers to discharge        Co-evaluation               AM-PAC PT "6 Clicks" Mobility  Outcome Measure Help needed turning from your back to your side while in a flat bed without using bedrails?: A Little Help needed moving from lying on your back to sitting on the side of a flat bed without using bedrails?: A Lot Help needed moving to and from a bed to a chair (including a wheelchair)?: A Lot Help needed standing up from a chair using your arms (e.g., wheelchair or bedside chair)?: A Lot Help needed to walk in hospital room?: Total Help needed climbing 3-5 steps with a railing? : Total 6 Click Score: 11    End of Session Equipment Utilized During Treatment: Gait belt Activity Tolerance: Patient tolerated treatment well Patient left: in bed;with call bell/phone within reach;with bed alarm set Nurse Communication: Mobility status PT Visit Diagnosis: Muscle weakness (generalized) (M62.81);Other abnormalities of gait and mobility (R26.89);Pain    Time: 1356-1430 PT Time Calculation (min) (ACUTE ONLY): 34 min   Charges:   PT Evaluation $PT Eval Moderate Complexity: 1 Mod PT Treatments $Therapeutic Activity: 8-22 mins        Lorrin Goodell, PT  Office # [phone] Pager [phone]   Lorriane Shire 11/11/2019, 3:00 PM

## 2019-11-11 NOTE — Evaluation (Signed)
Occupational Therapy Evaluation Patient Details Name: Gina Hodges MRN: [id number] DOB: 09/15/1927 Today's Date: 11/11/2019    History of Present Illness 86 y.o. female dementia, atrial fibrillation, hard of hearing, GERD, history of CVA, and HLD. She presented to the ED from [facility] after she vomited and fell out of her wheelchair, While in the emergency room, she was noted to be hypotensive, more confused than her usual baseline, and thought to have a UTI.   Clinical Impression   PTA pt living at [facility] senior living, using wheelchair for all functional mobility with assist for transfers and BADL. Pt is very HOH at baseline but it able to appropriately follow commands when she understands. Bed mobility completed at max A with increased time to stabilize sitting balance EOB. Once sitting EOB, pt able to tolerate ~3 minutes before requesting to lie back down. She states she normally gets up in the bmornings and feels fatigued and unwell this PM. Given current status, recommend pt return to facility with therapies to address deconditioning and advance BADL. Will continue to follow per POC listed below.     Follow Up Recommendations  SNF;Supervision/Assistance - 24 hour;Other (comment)(return to facility with therapies)    Equipment Recommendations  None recommended by OT    Recommendations for Other Services       Precautions / Restrictions Precautions Precautions: Fall Precaution Comments: very HOH Restrictions Weight Bearing Restrictions: No      Mobility Bed Mobility Overal bed mobility: Needs Assistance Bed Mobility: Supine to Sit;Sit to Supine     Supine to sit: Max assist;HOB elevated Sit to supine: Max assist   General bed mobility comments: assist with BLE translation to EOB and truncal support to pull to sitting. increased time needed to stabilize sitting balance EOB  Transfers Overall transfer level: Needs assistance   Transfers: Sit to/from  Stand;Stand Pivot Transfers Sit to Stand: Mod assist Stand pivot transfers: Max assist       General transfer comment: deferred per pt, stating she did not feel well enough for transfer    Balance Overall balance assessment: Needs assistance Sitting-balance support: Feet supported;No upper extremity supported Sitting balance-Leahy Scale: Poor Sitting balance - Comments: at least min-mod A to maintain sitting balance EOB Postural control: Posterior lean Standing balance support: Bilateral upper extremity supported;During functional activity Standing balance-Leahy Scale: Poor Standing balance comment: heavy reliance on external support                           ADL either performed or assessed with clinical judgement   ADL Overall ADL's : Needs assistance/impaired Eating/Feeding: Minimal assistance;Sitting   Grooming: Minimal assistance;Sitting   Upper Body Bathing: Moderate assistance;Sitting   Lower Body Bathing: Total assistance;Sit to/from stand;Sitting/lateral leans   Upper Body Dressing : Moderate assistance;Sitting   Lower Body Dressing: Total assistance;Sitting/lateral leans;Sit to/from stand   Toilet Transfer: Maximal assistance;Squat-pivot;BSC   Toileting- Clothing Manipulation and Hygiene: Total assistance;Sitting/lateral lean;Sit to/from stand       Functional mobility during ADLs: Maximal assistance;Cueing for safety(EOB only this date) General ADL Comments: pt limited by generalized weakness for BADL     Vision Patient Visual Report: No change from baseline       Perception     Praxis      Pertinent Vitals/Pain Pain Assessment: No/denies pain Faces Pain Scale: Hurts little more Pain Location: "I hurt all over." Pain Descriptors / Indicators: Discomfort;Sore Pain Intervention(s): Monitored during session;Repositioned  Hand Dominance     Extremity/Trunk Assessment Upper Extremity Assessment Upper Extremity Assessment: Generalized  weakness   Lower Extremity Assessment Lower Extremity Assessment: Generalized weakness   Cervical / Trunk Assessment Cervical / Trunk Assessment: Kyphotic   Communication Communication Communication: HOH   Cognition Arousal/Alertness: Awake/alert Behavior During Therapy: Flat affect Overall Cognitive Status: History of cognitive impairments - at baseline                                 General Comments: noted cognitive deficits at baseline per chart review, overall pt is pleasant and following commands. Increased difficulty due to [facility] status   General Comments       Exercises     Shoulder Instructions      Home Living Family/patient expects to be discharged to:: Assisted living                                 Additional Comments: from [facility]      Prior Functioning/Environment Level of Independence: Needs assistance  Gait / Transfers Assistance Needed: uses wheelchair at baseline, states she needs "a lot of help" to get to and from w/c ADL's / Homemaking Assistance Needed: assist for all BADL            OT Problem List: Decreased strength;Decreased knowledge of use of DME or AE;Decreased activity tolerance;Impaired balance (sitting and/or standing)      OT Treatment/Interventions: Self-care/ADL training;Therapeutic exercise;Patient/family education;Balance training;Energy conservation;Therapeutic activities;DME and/or AE instruction    OT Goals(Current goals can be found in the care plan section) Acute Rehab OT Goals Patient Stated Goal: not stated Time For Goal Achievement: 11/25/19 Potential to Achieve Goals: Good  OT Frequency: Min 2X/week   Barriers to D/C:            Co-evaluation              AM-PAC OT "6 Clicks" Daily Activity     Outcome Measure Help from another person eating meals?: A Little Help from another person taking care of personal grooming?: A Little Help from another person toileting,  which includes using toliet, bedpan, or urinal?: A Lot Help from another person bathing (including washing, rinsing, drying)?: Total Help from another person to put on and taking off regular upper body clothing?: A Lot Help from another person to put on and taking off regular lower body clothing?: Total 6 Click Score: 12   End of Session Nurse Communication: Mobility status  Activity Tolerance: Patient limited by fatigue Patient left: in bed;with call bell/phone within reach;with bed alarm set  OT Visit Diagnosis: Unsteadiness on feet (R26.81);Other abnormalities of gait and mobility (R26.89);Muscle weakness (generalized) (M62.81);History of falling (Z91.81)                Time: 7408-1448 OT Time Calculation (min): 16 min Charges:  OT General Charges $OT Visit: 1 Visit OT Evaluation $OT Eval Moderate Complexity: 1 Mod  Zenovia Jarred, MSOT, OTR/L Acute Rehabilitation Services [facility] Office Number: [phone]  Zenovia Jarred 11/11/2019, 5:56 PM

## 2019-11-11 NOTE — Evaluation (Signed)
Clinical/Bedside Swallow Evaluation Patient Details  Name: Gina Hodges MRN: [id number] Date of Birth: Apr 08, 1927  Today's Date: 11/11/2019 Time:        Past Medical History:  Past Medical History:  Diagnosis Date  . Anemia   . Anxiety   . Cerebral infarct ([place])   . Dementia ([place])   . Dementia with behavioral problem ([place])   . GERD (gastroesophageal reflux disease)   . Hyperlipidemia   . Osteoporosis   . PAF (paroxysmal atrial fibrillation) ([place])   . Thyroid disease   . Ulcerative esophagitis   . UTI (urinary tract infection)   . Vertigo    Past Surgical History:  Past Surgical History:  Procedure Laterality Date  . ABDOMINAL HYSTERECTOMY    . APPENDECTOMY    . CHOLECYSTECTOMY     HPI:  Patient is a 86 y.o. female dementia, atrial fibrillation, hard of hearing, GERD, history of CVA, HLD-who was sent to the ED after she vomited and fell out of her wheelchair, while in the emergency room-she was noted to be hypotensive, more confused than her usual baseline-and thought to have a UTI.  At her SNF-patient was apparently on doxycycline and levofloxacin-for PNA.    Assessment / Plan / Recommendation Clinical Impression  Pt alert and participatory, pleasant and hard of hearing. She exhibitis immediate coughing with small sips of water, delayed coughing after several oz of nectar thick liquids and no cough after honey or pureed textures. She has recently suffered a fall and is also being treated for pna and UTI. Pt has had a remote history of dysphagia with acute illness. Would likely benefit from thickened liquids and puree over the short term, instrumental testing recommended for best diet prior to d/c.  SLP Visit Diagnosis: Dysphagia, oropharyngeal phase (R13.12)    Aspiration Risk  Moderate aspiration risk    Diet Recommendation Dysphagia 1 (Puree);Honey-thick liquid   Liquid Administration via: Cup;Spoon Medication Administration: Crushed with puree Supervision: Staff to  assist with self feeding Compensations: Slow rate;Small sips/bites Postural Changes: Seated upright at 90 degrees    Other  Recommendations     Follow up Recommendations        Frequency and Duration min 2x/week  2 weeks       Prognosis        Swallow Study   General HPI: Patient is a 86 y.o. female dementia, atrial fibrillation, hard of hearing, GERD, history of CVA, HLD-who was sent to the ED after she vomited and fell out of her wheelchair, while in the emergency room-she was noted to be hypotensive, more confused than her usual baseline-and thought to have a UTI.  At her SNF-patient was apparently on doxycycline and levofloxacin-for PNA.  Type of Study: Bedside Swallow Evaluation Previous Swallow Assessment: MBS 2017 - nectar Diet Prior to this Study: Dysphagia 3 (soft);Thin liquids Temperature Spikes Noted: No Respiratory Status: Room air History of Recent Intubation: No Behavior/Cognition: Alert;Cooperative;Pleasant mood Oral Cavity Assessment: Within Functional Limits Oral Care Completed by SLP: No Oral Cavity - Dentition: Edentulous Vision: Functional for self-feeding Self-Feeding Abilities: Needs assist Patient Positioning: Upright in bed Baseline Vocal Quality: Normal Volitional Cough: Strong Volitional Swallow: Able to elicit    Oral/Motor/Sensory Function Overall Oral Motor/Sensory Function: Within functional limits   Ice Chips Ice chips: Not tested   Thin Liquid Thin Liquid: Impaired Presentation: Cup;Straw Pharyngeal  Phase Impairments: Cough - Immediate    Nectar Thick Nectar Thick Liquid: Impaired Presentation: Spoon Pharyngeal Phase Impairments: Cough - Delayed  Honey Thick Honey Thick Liquid: Within functional limits Presentation: Spoon   Puree Puree: Within functional limits   Solid     Solid: Not tested     Harlon Ditty, MA CCC-SLP  Acute Rehabilitation Services Pager [phone] Office [phone]  Claudine Mouton 11/11/2019,4:24 PM

## 2019-11-11 NOTE — Plan of Care (Signed)
  Problem: Nutrition: Goal: Adequate nutrition will be maintained Outcome: Progressing   

## 2019-11-12 LAB — BASIC METABOLIC PANEL
Anion gap: 7 (ref 5–15)
BUN: 20 mg/dL (ref 8–23)
CO2: 24 mmol/L (ref 22–32)
Calcium: 9.2 mg/dL (ref 8.9–10.3)
Chloride: 110 mmol/L (ref 98–111)
Creatinine, Ser: 0.97 mg/dL (ref 0.44–1.00)
GFR calc Af Amer: 60 mL/min — ABNORMAL LOW (ref >60)
GFR calc non Af Amer: 51 mL/min — ABNORMAL LOW (ref >60)
Glucose, Bld: 101 mg/dL — ABNORMAL HIGH (ref 70–99)
Potassium: 4.1 mmol/L (ref 3.5–5.1)
Sodium: 141 mmol/L (ref 135–145)

## 2019-11-12 LAB — CBC
HCT: 35.7 % — ABNORMAL LOW (ref 36.0–46.0)
Hemoglobin: 11.1 g/dL — ABNORMAL LOW (ref 12.0–15.0)
MCH: 27.9 pg (ref 26.0–34.0)
MCHC: 31.1 g/dL (ref 30.0–36.0)
MCV: 89.7 fL (ref 80.0–100.0)
Platelets: 343 10*3/uL (ref 150–400)
RBC: 3.98 MIL/uL (ref 3.87–5.11)
RDW: 14.4 % (ref 11.5–15.5)
WBC: 8.8 10*3/uL (ref 4.0–10.5)
nRBC: 0 % (ref 0.0–0.2)

## 2019-11-12 NOTE — Progress Notes (Addendum)
  Speech Language Pathology Treatment: Dysphagia  Patient Details Name: Gina Hodges MRN: [id number] DOB: 1927-09-16 Today's Date: 11/12/2019 Time: 1355-1410 SLP Time Calculation (min) (ACUTE ONLY): 15 min  Assessment / Plan / Recommendation Clinical Impression  Pt observed with honey thick and thin liquids. Pt observed with delayed cough following almost every sip with both consistencies, unlike previous session as pt was not observed with coughing following honey thick liquids. Pt is planned to discharge in the next day, so a MBS will be necessary in order to determine the least restrictive diet prior to return to SNF. Will plan to complete MBS next date. Continue with Dys 1 and honey thick liquids at this time.    HPI HPI: Patient is a 86 y.o. female dementia, atrial fibrillation, hard of hearing, GERD, history of CVA, HLD-who was sent to the ED after she vomited and fell out of her wheelchair, while in the emergency room-she was noted to be hypotensive, more confused than her usual baseline-and thought to have a UTI.  At her SNF-patient was apparently on doxycycline and levofloxacin-for PNA.       SLP Plan  MBS       Recommendations  Diet recommendations: Honey-thick liquid;Dysphagia 1 (puree) Liquids provided via: Cup;Straw Medication Administration: Crushed with puree Supervision: Full supervision/cueing for compensatory strategies Compensations: Slow rate;Small sips/bites Postural Changes and/or Swallow Maneuvers: Seated upright 90 degrees;Upright 30-60 min after meal                Oral Care Recommendations: Oral care BID Follow up Recommendations: Skilled Nursing facility SLP Visit Diagnosis: Dysphagia, oropharyngeal phase (R13.12) Plan: MBS       GO               Maudry Mayhew, Student SLP Office: [phone]  11/12/2019, 2:11 PM

## 2019-11-12 NOTE — NC FL2 (Addendum)
[place] MEDICAID FL2 LEVEL OF CARE SCREENING TOOL     IDENTIFICATION  Patient Name: Gina Hodges Birthdate: September 27, 1927 Sex: female Admission Date (Current Location): 11/10/2019  [facility] and [place] Number:  Producer, television/film/video and Address:  [facility]. [facility], [address] [place], [place] [address]      Provider Number: [id number]  Attending Physician Name and Address:  Maretta Bees, MD  Relative Name and Phone Number:       Current Level of Care: Hospital Recommended Level of Care: Assisted Living Facility Prior Approval Number:    Date Approved/Denied:   PASRR Number:    Discharge Plan: Other (Comment)([facility])    Current Diagnoses: Patient Active Problem List   Diagnosis Date Noted  . Sepsis (HCC) 11/10/2019  . AKI (acute kidney injury) (HCC) 11/10/2019  . Fall 08/13/2019  . Ingrown nail 11/12/2013  . Compression fracture of spine (HCC) 11/07/2013  . Hypothyroidism 11/07/2013  . Hyperlipidemia   . Dementia with behavioral problem (HCC)   . GERD (gastroesophageal reflux disease)   . PAF (paroxysmal atrial fibrillation) (HCC)   . Generalized anxiety disorder 04/05/2013    Orientation RESPIRATION BLADDER Height & Weight     Self  Normal Incontinent Weight: 155 lb 6.8 oz (70.5 kg) Height:     BEHAVIORAL SYMPTOMS/MOOD NEUROLOGICAL BOWEL NUTRITION STATUS      Continent Puree with nectar thick liquids  AMBULATORY STATUS COMMUNICATION OF NEEDS Skin   Extensive Assist(utilizes wheelchair) Verbally Skin abrasions(abrasion left leg)                       Personal Care Assistance Level of Assistance  Bathing, Dressing, Feeding Bathing Assistance: Limited assistance Feeding assistance: Limited assistance Dressing Assistance: Limited assistance     Functional Limitations Info  Hearing, Sight, Speech Sight Info: Adequate Hearing Info: Impaired Speech Info: Adequate    SPECIAL CARE FACTORS FREQUENCY  PT  (By licensed PT), OT (By licensed OT)     PT Frequency: 3x week OT Frequency: 2x week            Contractures Contractures Info: Not present    Additional Factors Info  Code Status, Allergies, Psychotropic Code Status Info: DNR Allergies Info: Penicillins, Bactrim (Sulfamethoxazole-trimethoprim), Sulfa Antibiotics, Codeine Psychotropic Info: ALPRAZolam (XANAX) tablet 0.5 mg 2x daily PO; traZODone (DESYREL) tablet 75 mg daily at bedtime PO; venlafaxine XR (EFFEXOR-XR) 24 hr capsule 75 mg daily at bedtime PO         Current Medications (11/12/2019):  This is the current hospital active medication list Current Facility-Administered Medications  Medication Dose Route Frequency Provider Last Rate Last Admin  . acetaminophen (TYLENOL) tablet 650 mg  650 mg Oral TID Jackie Plum, MD   650 mg at 11/11/19 2202  . ALPRAZolam Prudy Feeler) tablet 0.5 mg  0.5 mg Oral BID Jackie Plum, MD   0.5 mg at 11/12/19 0924  . atorvastatin (LIPITOR) tablet 5 mg  5 mg Oral QHS Osei-Bonsu, Greggory Stallion, MD   5 mg at 11/11/19 2202  . calcium-vitamin D (OSCAL WITH D) 500-200 MG-UNIT per tablet 1 tablet  1 tablet Oral BID WC Leander Rams, [facility]   1 tablet at 11/12/19 1779  . cefTRIAXone (ROCEPHIN) 1 g in sodium chloride 0.9 % 100 mL IVPB  1 g Intravenous Q24H Ghimire, Shanker M, MD 200 mL/hr at 11/12/19 1046 1 g at 11/12/19 1046  . levothyroxine (SYNTHROID) tablet 75 mcg  75 mcg Oral Q0600  Benito Mccreedy, MD   75 mcg at 11/11/19 0517  . lidocaine (LIDODERM) 5 % 1 patch  1 patch Transdermal Q24H Donnamae Jude, [facility]   1 patch at 11/10/19 2237  . loratadine (CLARITIN) tablet 10 mg  10 mg Oral Daily Osei-Bonsu, Iona Beard, MD   10 mg at 11/12/19 0923  . Melatonin TABS 9 mg  9 mg Oral QHS Benito Mccreedy, MD   9 mg at 11/11/19 2202  . multivitamin with minerals tablet 1 tablet  1 tablet Oral Daily Osei-Bonsu, Iona Beard, MD   1 tablet at 11/12/19 [phone]  . pantoprazole (PROTONIX) EC tablet 40 mg  40 mg Oral Daily  Osei-Bonsu, Iona Beard, MD   40 mg at 11/12/19 0923  . polyethylene glycol (MIRALAX / GLYCOLAX) packet 17 g  17 g Oral Daily PRN Benito Mccreedy, MD   17 g at 11/11/19 1652  . Resource ThickenUp Clear   Oral PRN Jonetta Osgood, MD      . [facility]) tablet 8.6 mg  1 tablet Oral Daily Osei-Bonsu, Iona Beard, MD   8.6 mg at 11/12/19 0921  . traZODone (DESYREL) tablet 75 mg  75 mg Oral QHS Benito Mccreedy, MD   75 mg at 11/11/19 2202  . venlafaxine XR (EFFEXOR-XR) 24 hr capsule 75 mg  75 mg Oral Q breakfast Osei-Bonsu, Iona Beard, MD   75 mg at 11/12/19 1191     Discharge Medications: STOP taking these medications   doxycycline 100 MG capsule Commonly known as: VIBRAMYCIN   levofloxacin 750 MG tablet Commonly known as: LEVAQUIN     TAKE these medications   acetaminophen 650 MG CR tablet Commonly known as: TYLENOL Take 650 mg by mouth 3 (three) times daily.   ALPRAZolam 0.5 MG tablet Commonly known as: XANAX Take 1 tablet (0.5 mg total) by mouth 2 (two) times daily.   alum & mag hydroxide-simeth 200-200-20 MG/5ML suspension Commonly known as: MAALOX/MYLANTA Take 30 mLs by mouth every 8 (eight) hours as needed for indigestion or heartburn.   atorvastatin 10 MG tablet Commonly known as: LIPITOR Take 5 mg by mouth at bedtime.   Calcium Carbonate-Vitamin D 600-400 MG-UNIT tablet Take 1 tablet by mouth 2 (two) times daily.   levothyroxine 75 MCG tablet Commonly known as: SYNTHROID Take 75 mcg by mouth daily before breakfast.   loperamide 2 MG capsule Commonly known as: IMODIUM Take 2-4 mg by mouth See admin instructions. Take 2 capsules (4 mg) by mouth at first loose stool, then take 1 capsule (2 mg) every 2 hours as needed for diarrhea/loose stools   loratadine 10 MG tablet Commonly known as: CLARITIN Take 10 mg by mouth daily.   Melatonin 10 MG Tabs Take 10 mg by mouth at bedtime.   meloxicam 15 MG tablet Commonly known as: MOBIC Take 15 mg by mouth  daily.   multivitamin with minerals Tabs tablet Take 1 tablet by mouth daily.   omeprazole 20 MG capsule Commonly known as: PRILOSEC Take 20 mg by mouth 2 (two) times daily.   polyethylene glycol 17 g packet Commonly known as: MIRALAX / GLYCOLAX Take 17 g by mouth daily as needed for mild constipation (MIX AND DRINK).   Salonpas Pain Relieving 4 % Ptch Generic drug: Lidocaine Apply 1 patch topically See admin instructions. Apply 1 patch to affected area(s) two times a day and remove, per schedule   senna 8.6 MG Tabs tablet Commonly known as: SENOKOT Take 1 tablet by mouth daily.   traZODone 150 MG tablet Commonly  known as: DESYREL Take 75 mg by mouth at bedtime.   venlafaxine XR 75 MG 24 hr capsule Commonly known as: EFFEXOR-XR Take 75 mg by mouth daily with breakfast.     Relevant Imaging Results:  Relevant Lab Results:   Additional Information SSN: [id number]  Medication List    STOP taking these medications   doxycycline 100 MG capsule Commonly known as: VIBRAMYCIN   levofloxacin 750 MG tablet Commonly known as: LEVAQUIN     TAKE these medications   acetaminophen 650 MG CR tablet Commonly known as: TYLENOL Take 650 mg by mouth 3 (three) times daily.   ALPRAZolam 0.5 MG tablet Commonly known as: XANAX Take 1 tablet (0.5 mg total) by mouth 2 (two) times daily.   alum & mag hydroxide-simeth 200-200-20 MG/5ML suspension Commonly known as: MAALOX/MYLANTA Take 30 mLs by mouth every 8 (eight) hours as needed for indigestion or heartburn.   atorvastatin 10 MG tablet Commonly known as: LIPITOR Take 5 mg by mouth at bedtime.   Calcium Carbonate-Vitamin D 600-400 MG-UNIT tablet Take 1 tablet by mouth 2 (two) times daily.   levothyroxine 75 MCG tablet Commonly known as: SYNTHROID Take 75 mcg by mouth daily before breakfast.   loperamide 2 MG capsule Commonly known as: IMODIUM Take 2-4 mg by mouth See admin instructions. Take 2  capsules (4 mg) by mouth at first loose stool, then take 1 capsule (2 mg) every 2 hours as needed for diarrhea/loose stools   loratadine 10 MG tablet Commonly known as: CLARITIN Take 10 mg by mouth daily.   Melatonin 10 MG Tabs Take 10 mg by mouth at bedtime.   meloxicam 15 MG tablet Commonly known as: MOBIC Take 15 mg by mouth daily.   multivitamin with minerals Tabs tablet Take 1 tablet by mouth daily.   omeprazole 20 MG capsule Commonly known as: PRILOSEC Take 20 mg by mouth 2 (two) times daily.   polyethylene glycol 17 g packet Commonly known as: MIRALAX / GLYCOLAX Take 17 g by mouth daily as needed for mild constipation (MIX AND DRINK).   Salonpas Pain Relieving 4 % Ptch Generic drug: Lidocaine Apply 1 patch topically See admin instructions. Apply 1 patch to affected area(s) two times a day and remove, per schedule   senna 8.6 MG Tabs tablet Commonly known as: SENOKOT Take 1 tablet by mouth daily.   traZODone 150 MG tablet Commonly known as: DESYREL Take 75 mg by mouth at bedtime.   venlafaxine XR 75 MG 24 hr capsule Commonly known as: EFFEXOR-XR Take 75 mg by mouth daily with breakfast.      Doy Hutching, LCSWA

## 2019-11-12 NOTE — TOC Initial Note (Signed)
Transition of Care [facility]) - Initial/Assessment Note    Patient Details  Name: Gina Hodges MRN: [id number] Date of Birth: 12-22-1926  Transition of Care [facility]) CM/SW Contact:    Doy Hutching, LCSWA Phone Number: 11/12/2019, 1:29 PM  Clinical Narrative:                 CSW called and spoke with pt nephew via telephone- [phone]. Introduced self, role, reason for call. Pt nephew [name] confirms that pt from [facility]; she has been there for 4-5 years. He confirmed that he would like for her to return with therapies following if that is what's recommended.   CSW then called and spoke with pt ALF. They confirm pt usually utilises her wheelchair. They are agreeable to her returning to ALF with therapy through [facility]. CSW also discussed dietary recommendations pt currently recommended for puree and thickened liquids- per JJ they can accomodate this and ask for it to be on her fl2.  CSW then called Kenard Gower with [facility]; he will follow for pt to be set up with PT/OT. Requested HH orders and face to face from Dr. Jerral Ralph.   Expected Discharge Plan: Assisted Living(w/HH) Barriers to Discharge: Continued Medical Work up   Patient Goals and CMS Choice Patient states their goals for this hospitalization and ongoing recovery are:: return to [facility] [employer] [email or web address].gov Compare Post Acute Care list provided to:: Patient Represenative (must comment)(pt nephew) Choice offered to / list presented to : (pt nephew)  Expected Discharge Plan and Services Expected Discharge Plan: Assisted Living(w/HH) In-house Referral: Clinical Social Work Discharge Planning Services: CM Consult  HH Arranged: PT, OT HH Agency: [facility] Date [facility] Agency Contacted: 11/12/19 Time HH Agency Contacted: 1326 Representative spoke with at [facility] Agency: Angela Adam  Prior Living Arrangements/Services   Lives with:: Facility Resident Patient language and need for interpreter reviewed:: Yes(no  needs) Do you feel safe going back to the place where you live?: Yes      Need for Family Participation in Patient Care: Yes (Comment)(assistance with daily cares; support) Care giver support system in place?: Yes (comment)(facility staff; nephew) Current home services: DME Criminal Activity/Legal Involvement Pertinent to Current Situation/Hospitalization: No - Comment as needed  Permission Sought/Granted Permission sought to share information with : Family Supports Permission granted to share information with : Yes, Verbal Permission Granted  Share Information with NAME: Kathlen Brunswick  Permission granted to share info w AGENCY: [facility]  Permission granted to share info w Relationship: nephew  Permission granted to share info w Contact Information: [phone]  Emotional Assessment Appearance:: Other (Comment Required(telephonic assessment with pt nephew) Attitude/Demeanor/Rapport: (telephonic assessment with pt nephew) Affect (typically observed): (telephonic assessment with pt nephew) Orientation: : Oriented to Self, Fluctuating Orientation (Suspected and/or reported Sundowners) Alcohol / Substance Use: Not Applicable Psych Involvement: No (comment)  Admission diagnosis:  Acute cystitis without hematuria [N30.00] Generalized weakness [R53.1] Fall, initial encounter [W19.XXXA] Sepsis (HCC) [A41.9] Non-intractable vomiting, presence of nausea not specified, unspecified vomiting type [R11.10] Patient Active Problem List   Diagnosis Date Noted  . Sepsis (HCC) 11/10/2019  . AKI (acute kidney injury) (HCC) 11/10/2019  . Fall 08/13/2019  . Ingrown nail 11/12/2013  . Compression fracture of spine (HCC) 11/07/2013  . Hypothyroidism 11/07/2013  . Hyperlipidemia   . Dementia with behavioral problem (HCC)   . GERD (gastroesophageal reflux disease)   . PAF (paroxysmal atrial fibrillation) (HCC)   . Generalized anxiety disorder 04/05/2013   PCP:  Patient,  No Pcp  Per Pharmacy:   [place], [place] - [address] [place] [address] Phone: [phone] Fax: [place], [place] - [address] [place] [address] Phone: [phone] Fax: [phone]    Readmission Risk Interventions Readmission Risk Prevention Plan 11/12/2019  Post Dischage Appt Not Complete  Appt Comments facility resident  Medication Screening Complete  Transportation Screening Complete  Some recent data might be hidden

## 2019-11-12 NOTE — Progress Notes (Addendum)
PROGRESS NOTE        PATIENT DETAILS Name: Gina Hodges Age: 86 y.o. Sex: female Date of Birth: November 16, 1926 Admit Date: 11/10/2019 Admitting Physician Jackie Plum, MD DEY:[id number], No Pcp Per  Brief Narrative: Patient is a 86 y.o. female dementia, atrial fibrillation, hard of hearing, GERD, history of CVA, HLD-who scented to the ED after she vomited and fell out of her wheelchair, while in the emergency room-she was noted to be hypotensive, more confused than her usual baseline-and thought to have a UTI.  At her SNF-patient was apparently on doxycycline and levofloxacin-for PNA. She was subsequently admitted to the hospitalist service.  Subjective: Pleasantly confused-lying comfortably in bed.  No major issues overnight-she was eating breakfast when I walked in.  Assessment/Plan: Sepsis with hypotension secondary to UTI POA: Sepsis physiology has resolved-blood cultures negative-she was already on antibiotics prior to admission-hence all cultures will likely be sterile.  Continue Rocephin for 1 additional day before considering discharge if cultures remain negative.    Mild AKI: Probably secondary to hypotension in the setting of sepsis.  Resolved with supportive care.  Acute metabolic encephalopathy superimposed on advanced dementia: Suspect secondary to sepsis/UTI/hypotension-improved with treatment of underlying sepsis.  Improved-likely she is not that far from her usual baseline today  PAF: Sinus rhythm-clearly not a candidate for anticoagulation given advanced age/frailty and fall risk.  Hypothyroidism: Continue levothyroxine  Dementia: Pleasantly confused-at risk for delirium-continue trazodone, Effexor  Mechanical fall: She apparently fell out of her wheelchair while at ALF-PT eval completed-SNF on discharge.  Social work consulted.  Diet: Diet Order            DIET - DYS 1 Room service appropriate? Yes; Fluid consistency: Honey Thick  Diet  effective now               DVT Prophylaxis: Prophylactic Lovenox   Code Status:  DNR (confirmed with nephew)  Family Communication: Left voicemail for nephew on 2/8  Disposition Plan: SNF hopefully on 2/9  Barriers to Discharge: On IV rocephin-pending repeat COVID test-SNF bed pending   Antimicrobial agents: Anti-infectives (From admission, onward)   Start     Dose/Rate Route Frequency Ordered Stop   11/11/19 1145  cefTRIAXone (ROCEPHIN) 1 g in sodium chloride 0.9 % 100 mL IVPB     1 g 200 mL/hr over 30 Minutes Intravenous Every 24 hours 11/11/19 1143     11/11/19 0400  ceFEPIme (MAXIPIME) 2 g in sodium chloride 0.9 % 100 mL IVPB  Status:  Discontinued     2 g 200 mL/hr over 30 Minutes Intravenous Every 12 hours 11/10/19 1840 11/11/19 1143   11/10/19 1615  ceFEPIme (MAXIPIME) 1 g in sodium chloride 0.9 % 100 mL IVPB  Status:  Discontinued     1 g 200 mL/hr over 30 Minutes Intravenous Every 8 hours 11/10/19 1600 11/10/19 1830   11/10/19 1545  cefTRIAXone (ROCEPHIN) 1 g in sodium chloride 0.9 % 100 mL IVPB  Status:  Discontinued     1 g 200 mL/hr over 30 Minutes Intravenous  Once 11/10/19 1541 11/10/19 1600      Procedures: None  CONSULTS:  None  Time spent: 25- minutes-Greater than 50% of this time was spent in counseling, explanation of diagnosis, planning of further management, and coordination of care.  MEDICATIONS: Scheduled Meds: . acetaminophen  650 mg Oral TID  .  ALPRAZolam  0.5 mg Oral BID  . atorvastatin  5 mg Oral QHS  . calcium-vitamin D  1 tablet Oral BID WC  . levothyroxine  75 mcg Oral Q0600  . lidocaine  1 patch Transdermal Q24H  . loratadine  10 mg Oral Daily  . Melatonin  9 mg Oral QHS  . multivitamin with minerals  1 tablet Oral Daily  . pantoprazole  40 mg Oral Daily  . senna  1 tablet Oral Daily  . traZODone  75 mg Oral QHS  . venlafaxine XR  75 mg Oral Q breakfast   Continuous Infusions: . cefTRIAXone (ROCEPHIN)  IV 1 g (11/12/19  1046)   PRN Meds:.polyethylene glycol, Resource ThickenUp Clear   PHYSICAL EXAM: Vital signs: Vitals:   11/11/19 1645 11/11/19 2240 11/12/19 0443 11/12/19 0904  BP: (!) 117/57 122/61 123/77 111/74  Pulse: 83 88 81 87  Resp: 18 16 16 18   Temp: 98.9 F (37.2 C) 98 F (36.7 C) 98 F (36.7 C) 98.6 F (37 C)  TempSrc: Oral Oral Oral Oral  SpO2: 96% 97% 97% 99%  Weight:  70.5 kg     Filed Weights   11/10/19 2049 11/11/19 2240  Weight: 71 kg 70.5 kg   Body mass index is 29.38 kg/m.   Gen Exam: Pleasantly confused-lying comfortably in bed. HEENT:atraumatic, normocephalic Chest: B/L clear to auscultation anteriorly CVS:S1S2 regular Abdomen:soft non tender, non distended Extremities:no edema Neurology: Moves all 4 extremities. Skin: no rash  I have personally reviewed following labs and imaging studies  LABORATORY DATA: CBC: Recent Labs  Lab 11/10/19 1145 11/11/19 0625 11/12/19 0601  WBC 12.4* 8.7 8.8  NEUTROABS 9.8*  --   --   HGB 11.5* 10.8* 11.1*  HCT 38.1 34.9* 35.7*  MCV 92.7 90.9 89.7  PLT 393 331 343    Basic Metabolic Panel: Recent Labs  Lab 11/10/19 1145 11/11/19 0625 11/12/19 0601  NA 138 138 141  K 4.3 4.0 4.1  CL 104 109 110  CO2 25 21* 24  GLUCOSE 120* 100* 101*  BUN 24* 22 20  CREATININE 1.05* 0.92 0.97  CALCIUM 9.0 8.9 9.2    GFR: Estimated Creatinine Clearance: 34.6 mL/min (by C-G formula based on SCr of 0.97 mg/dL).  Liver Function Tests: Recent Labs  Lab 11/10/19 1145 11/11/19 0625  AST 20 14*  ALT 12 12  ALKPHOS 66 67  BILITOT 0.5 0.5  PROT 6.1* 5.5*  ALBUMIN 3.3* 3.0*   Recent Labs  Lab 11/10/19 1145  LIPASE 17   No results for input(s): AMMONIA in the last 168 hours.  Coagulation Profile: Recent Labs  Lab 11/10/19 1849  INR 1.1    Cardiac Enzymes: No results for input(s): CKTOTAL, CKMB, CKMBINDEX, TROPONINI in the last 168 hours.  BNP (last 3 results) No results for input(s): PROBNP in the last 8760  hours.  Lipid Profile: No results for input(s): CHOL, HDL, LDLCALC, TRIG, CHOLHDL, LDLDIRECT in the last 72 hours.  Thyroid Function Tests: No results for input(s): TSH, T4TOTAL, FREET4, T3FREE, THYROIDAB in the last 72 hours.  Anemia Panel: No results for input(s): VITAMINB12, FOLATE, FERRITIN, TIBC, IRON, RETICCTPCT in the last 72 hours.  Urine analysis:    Component Value Date/Time   COLORURINE AMBER (A) 11/10/2019 1500   APPEARANCEUR HAZY (A) 11/10/2019 1500   LABSPEC 1.028 11/10/2019 1500   PHURINE 5.0 11/10/2019 1500   GLUCOSEU NEGATIVE 11/10/2019 1500   HGBUR NEGATIVE 11/10/2019 1500   BILIRUBINUR NEGATIVE 11/10/2019 1500   KETONESUR  NEGATIVE 11/10/2019 1500   PROTEINUR 30 (A) 11/10/2019 1500   NITRITE NEGATIVE 11/10/2019 1500   LEUKOCYTESUR LARGE (A) 11/10/2019 1500    Sepsis Labs: Lactic Acid, Venous    Component Value Date/Time   LATICACIDVEN 1.0 11/10/2019 2017    MICROBIOLOGY: Recent Results (from the past 240 hour(s))  Respiratory Panel by RT PCR (Flu A&B, Covid) - Nasopharyngeal Swab     Status: None   Collection Time: 11/10/19  3:00 PM   Specimen: Nasopharyngeal Swab  Result Value Ref Range Status   SARS Coronavirus 2 by RT PCR NEGATIVE NEGATIVE Final    Comment: (NOTE) SARS-CoV-2 target nucleic acids are NOT DETECTED. The SARS-CoV-2 RNA is generally detectable in upper respiratoy specimens during the acute phase of infection. The lowest concentration of SARS-CoV-2 viral copies this assay can detect is 131 copies/mL. A negative result does not preclude SARS-Cov-2 infection and should not be used as the sole basis for treatment or other patient management decisions. A negative result may occur with  improper specimen collection/handling, submission of specimen other than nasopharyngeal swab, presence of viral mutation(s) within the areas targeted by this assay, and inadequate number of viral copies (<131 copies/mL). A negative result must be combined  with clinical observations, patient history, and epidemiological information. The expected result is Negative. Fact Sheet for Patients:  https://www.moore.com/ Fact Sheet for Healthcare Providers:  https://www.young.biz/ This test is not yet ap proved or cleared by the [place] FDA and  has been authorized for detection and/or diagnosis of SARS-CoV-2 by FDA under an Emergency Use Authorization (EUA). This EUA will remain  in effect (meaning this test can be used) for the duration of the COVID-19 declaration under Section 564(b)(1) of the Act, 21 U.S.C. section 360bbb-3(b)(1), unless the authorization is terminated or revoked sooner.    Influenza A by PCR NEGATIVE NEGATIVE Final   Influenza B by PCR NEGATIVE NEGATIVE Final    Comment: (NOTE) The Xpert Xpress SARS-CoV-2/FLU/RSV assay is intended as an aid in  the diagnosis of influenza from Nasopharyngeal swab specimens and  should not be used as a sole basis for treatment. Nasal washings and  aspirates are unacceptable for Xpert Xpress SARS-CoV-2/FLU/RSV  testing. Fact Sheet for Patients: https://www.moore.com/ Fact Sheet for Healthcare Providers: https://www.young.biz/ This test is not yet approved or cleared by the [place] FDA and  has been authorized for detection and/or diagnosis of SARS-CoV-2 by  FDA under an Emergency Use Authorization (EUA). This EUA will remain  in effect (meaning this test can be used) for the duration of the  Covid-19 declaration under Section 564(b)(1) of the Act, 21  U.S.C. section 360bbb-3(b)(1), unless the authorization is  terminated or revoked. Performed at [facility], [address], [place], [place] [address]   Urine culture     Status: None   Collection Time: 11/10/19  3:35 PM   Specimen: Urine, Random  Result Value Ref Range Status   Specimen Description URINE, RANDOM  Final   Special Requests  NONE  Final   Culture   Final    NO GROWTH Performed at [facility], [address] [place], [place] [address]    Report Status 11/11/2019 FINAL  Final  Blood culture (routine x 2)     Status: None (Preliminary result)   Collection Time: 11/10/19  3:50 PM   Specimen: BLOOD  Result Value Ref Range Status   Specimen Description BLOOD RIGHT ANTECUBITAL  Final   Special Requests   Final  BOTTLES DRAWN AEROBIC AND ANAEROBIC Blood Culture adequate volume Performed at [facility], [address], [place], [place] [address]    Culture NO GROWTH < 24 HOURS  Final   Report Status PENDING  Incomplete  Blood culture (routine x 2)     Status: None (Preliminary result)   Collection Time: 11/10/19  4:00 PM   Specimen: BLOOD  Result Value Ref Range Status   Specimen Description BLOOD LEFT ANTECUBITAL  Final   Special Requests   Final    BOTTLES DRAWN AEROBIC AND ANAEROBIC Blood Culture adequate volume Performed at [facility], [address] [place], [place] [address]    Culture NO GROWTH < 24 HOURS  Final   Report Status PENDING  Incomplete  Culture, blood (x 2)     Status: None (Preliminary result)   Collection Time: 11/10/19  6:49 PM   Specimen: BLOOD  Result Value Ref Range Status   Specimen Description BLOOD RIGHT ANTECUBITAL  Final   Special Requests   Final    BOTTLES DRAWN AEROBIC AND ANAEROBIC Blood Culture results may not be optimal due to an inadequate volume of blood received in culture bottles   Culture NO GROWTH < 12 HOURS  Final   Report Status PENDING  Incomplete  Culture, blood (x 2)     Status: None (Preliminary result)   Collection Time: 11/10/19  6:57 PM   Specimen: BLOOD LEFT HAND  Result Value Ref Range Status   Specimen Description BLOOD LEFT HAND  Final   Special Requests   Final    BOTTLES DRAWN AEROBIC AND ANAEROBIC Blood Culture results may not be optimal due to an inadequate volume of blood received in culture bottles   Culture NO  GROWTH < 12 HOURS  Final   Report Status PENDING  Incomplete    RADIOLOGY STUDIES/RESULTS: CT Head Wo Contrast  Result Date: 11/10/2019 CLINICAL DATA:  Patient found down today. Lethargy for 1-2 days. Initial encounter. EXAM: CT HEAD WITHOUT CONTRAST CT CERVICAL SPINE WITHOUT CONTRAST TECHNIQUE: Multidetector CT imaging of the head and cervical spine was performed following the standard protocol without intravenous contrast. Multiplanar CT image reconstructions of the cervical spine were also generated. COMPARISON:  Head and cervical spine CT scans 08/13/2019. FINDINGS: CT HEAD FINDINGS Brain: No evidence of acute infarction, hemorrhage, hydrocephalus, extra-axial collection or mass lesion/mass effect. Atrophy and extensive chronic microvascular ischemic change are again seen. Vascular: No hyperdense vessel or unexpected calcification. Skull: Intact.  No focal lesion. Sinuses/Orbits: Negative. Other: None. CT CERVICAL SPINE FINDINGS Alignment: Maintained. Skull base and vertebrae: No acute fracture. No primary bone lesion or focal pathologic process. Mild, remote superior endplate compression fracture of C7 is unchanged. Soft tissues and spinal canal: Negative. Disc levels:  Intervertebral disc space height is maintained. Upper chest: Negative. Other: None. IMPRESSION: No acute abnormality head or cervical spine. Atrophy and extensive chronic microvascular ischemic change. Electronically Signed   By: Drusilla Kanner M.D.   On: 11/10/2019 12:56   CT Cervical Spine Wo Contrast  Result Date: 11/10/2019 CLINICAL DATA:  Patient found down today. Lethargy for 1-2 days. Initial encounter. EXAM: CT HEAD WITHOUT CONTRAST CT CERVICAL SPINE WITHOUT CONTRAST TECHNIQUE: Multidetector CT imaging of the head and cervical spine was performed following the standard protocol without intravenous contrast. Multiplanar CT image reconstructions of the cervical spine were also generated. COMPARISON:  Head and cervical spine CT  scans 08/13/2019. FINDINGS: CT HEAD FINDINGS Brain: No evidence of acute infarction, hemorrhage, hydrocephalus,  extra-axial collection or mass lesion/mass effect. Atrophy and extensive chronic microvascular ischemic change are again seen. Vascular: No hyperdense vessel or unexpected calcification. Skull: Intact.  No focal lesion. Sinuses/Orbits: Negative. Other: None. CT CERVICAL SPINE FINDINGS Alignment: Maintained. Skull base and vertebrae: No acute fracture. No primary bone lesion or focal pathologic process. Mild, remote superior endplate compression fracture of C7 is unchanged. Soft tissues and spinal canal: Negative. Disc levels:  Intervertebral disc space height is maintained. Upper chest: Negative. Other: None. IMPRESSION: No acute abnormality head or cervical spine. Atrophy and extensive chronic microvascular ischemic change. Electronically Signed   By: Drusilla Kannerhomas  Dalessio M.D.   On: 11/10/2019 12:56   CT ABDOMEN PELVIS W CONTRAST  Result Date: 11/10/2019 CLINICAL DATA:  Right upper quadrant and lower quadrant pain. EXAM: CT ABDOMEN AND PELVIS WITH CONTRAST TECHNIQUE: Multidetector CT imaging of the abdomen and pelvis was performed using the standard protocol following bolus administration of intravenous contrast. CONTRAST:  80mL OMNIPAQUE IOHEXOL 300 MG/ML  SOLN COMPARISON:  None. FINDINGS: Lower chest: Basilar atelectasis on the right. Hepatobiliary: No suspicious focal abnormality within the liver parenchyma. Mild intra and extrahepatic biliary duct dilatation. Extrahepatic common bile duct measures 8 mm diameter in the head of the pancreas, upper normal for patient age. Nonvisualization of the gallbladder suggest cholecystectomy. Pancreas: No focal mass lesion. No dilatation of the main duct. No intraparenchymal cyst. No peripancreatic edema. Spleen: No splenomegaly. No focal mass lesion. Adrenals/Urinary Tract: No adrenal nodule or mass. 8.2 x 6.1 x 9.1 cm exophytic cyst noted lower pole right kidney.  9 mm hypoattenuating lesion in the upper pole right kidney is too small to characterize but likely a cyst. 16 mm cyst noted upper pole left kidney with adjacent 6 mm subcapsular low-density lesion, also likely a cyst. No evidence for hydroureter. The urinary bladder appears normal for the degree of distention. Stomach/Bowel: Large hiatal hernia. 50-75% of the stomach is in the chest. Duodenum is normally positioned as is the ligament of Treitz. No small bowel wall thickening. No small bowel dilatation. The terminal ileum is normal. The appendix is not visualized, but there is no edema or inflammation in the region of the cecum. No gross colonic mass. No colonic wall thickening. Vascular/Lymphatic: There is abdominal aortic atherosclerosis without aneurysm. There is no gastrohepatic or hepatoduodenal ligament lymphadenopathy. No retroperitoneal or mesenteric lymphadenopathy. No pelvic sidewall lymphadenopathy. Reproductive: Hysterectomy.  There is no adnexal mass. Other: No intraperitoneal free fluid. Musculoskeletal: Bones are diffusely demineralized. No worrisome lytic or sclerotic osseous abnormality. Status post vertebral augmentation at T8 and T12. Superior endplate compression deformity noted at T11. IMPRESSION: 1. No acute findings in the abdomen or pelvis. 2. Large hiatal hernia with 50-75% of the stomach in the chest. 3. Mild prominence of the intrahepatic bile ducts with extrahepatic common bile duct measuring upper normal for patient age. 4.  Aortic Atherosclerois (ICD10-170.0) Electronically Signed   By: Kennith CenterEric  Mansell M.D.   On: 11/10/2019 14:42   DG Chest Portable 1 View  Result Date: 11/10/2019 CLINICAL DATA:  Fall. EXAM: PORTABLE CHEST 1 VIEW COMPARISON:  08/13/2019 FINDINGS: 11:31 a.m. The lungs are clear without focal pneumonia, edema, pneumothorax or pleural effusion. Low volumes. Interstitial markings are diffusely coarsened with chronic features. The cardio pericardial silhouette is enlarged.  Large hiatal hernia. Patient is status post midthoracic vertebral augmentation. Bones are diffusely demineralized. Telemetry leads overlie the chest. IMPRESSION: Low volumes without acute cardiopulmonary findings. Large hiatal hernia. Electronically Signed   By: Jamison OkaEric  Mansell M.D.  On: 11/10/2019 11:45     LOS: 2 days   Oren Binet, MD  [facility]    To contact the attending provider between 7A-7P or the covering provider during after hours 7P-7A, please log into the web site [email or web address] and access using universal [place] password for that web site. If you do not have the password, please call the hospital operator.  11/12/2019, 10:53 AM

## 2019-11-12 NOTE — Plan of Care (Signed)
Plan of care reviewed with pt at bedside. Pt denies pain, resting in bed. Reorientation provided prn. Fall bundle in place. Call bell in reach. Pt stable at this time, will continue to monitor.  Problem: Fluid Volume: Goal: Hemodynamic stability will improve Outcome: Progressing   Problem: Clinical Measurements: Goal: Diagnostic test results will improve Outcome: Progressing Goal: Signs and symptoms of infection will decrease Outcome: Progressing   Problem: Respiratory: Goal: Ability to maintain adequate ventilation will improve Outcome: Progressing   Problem: Education: Goal: Knowledge of General Education information will improve Description: Including pain rating scale, medication(s)/side effects and non-pharmacologic comfort measures Outcome: Progressing   Problem: Health Behavior/Discharge Planning: Goal: Ability to manage health-related needs will improve Outcome: Progressing   Problem: Clinical Measurements: Goal: Ability to maintain clinical measurements within normal limits will improve Outcome: Progressing Goal: Will remain free from infection Outcome: Progressing Goal: Diagnostic test results will improve Outcome: Progressing Goal: Respiratory complications will improve Outcome: Progressing Goal: Cardiovascular complication will be avoided Outcome: Progressing   Problem: Activity: Goal: Risk for activity intolerance will decrease Outcome: Progressing   Problem: Nutrition: Goal: Adequate nutrition will be maintained Outcome: Progressing   Problem: Coping: Goal: Level of anxiety will decrease Outcome: Progressing   Problem: Elimination: Goal: Will not experience complications related to bowel motility Outcome: Progressing Goal: Will not experience complications related to urinary retention Outcome: Progressing   Problem: Pain Managment: Goal: General experience of comfort will improve Outcome: Progressing   Problem: Safety: Goal: Ability to remain  free from injury will improve Outcome: Progressing   Problem: Skin Integrity: Goal: Risk for impaired skin integrity will decrease Outcome: Progressing

## 2019-11-13 ENCOUNTER — Inpatient Hospital Stay (HOSPITAL_COMMUNITY): Payer: Medicare HMO

## 2019-11-13 DIAGNOSIS — W19XXXA Unspecified fall, initial encounter: Secondary | ICD-10-CM

## 2019-11-13 LAB — SARS CORONAVIRUS 2 (TAT 6-24 HRS): SARS Coronavirus 2: NEGATIVE

## 2019-11-13 MED ORDER — ALPRAZOLAM 0.5 MG PO TABS: 0.5000 mg | ORAL_TABLET | Freq: Two times a day (BID) | ORAL | 0 refills | Status: DC

## 2019-11-13 NOTE — Discharge Summary (Addendum)
PATIENT DETAILS Name: Gina Hodges Age: 86 y.o. Sex: female Date of Birth: 1927/08/27 MRN: [id number]. Admitting Physician: Jackie Plum, MD TWS:[id number], No Pcp Per  Admit Date: 11/10/2019 Discharge date: 11/13/2019  Recommendations for Outpatient Follow-up:  1. Follow up with PCP in 1-2 weeks 2. Please obtain CMP/CBC in one week  Admitted From:  ALF   Disposition: ALF with home health services   Home Health:  Yes  Equipment/Devices: None  Discharge Condition: Stable  CODE STATUS: DNR  Diet recommendation:  Diet Order            DIET - Puree diet; Fluid consistency: Nectar Thick  Diet effective now                     Brief Summary: See H&P, Labs, Consult and Test reports for all details in brief,Patient is a 86 y.o. female dementia, atrial fibrillation, hard of hearing, GERD, history of CVA, HLD-who scented to the ED after she vomited and fell out of her wheelchair, while in the emergency room-she was noted to be hypotensive, more confused than her usual baseline-and thought to have a UTI.  At her SNF-patient was apparently on doxycycline and levofloxacin-for PNA. She was subsequently admitted to the hospitalist service.  Brief Hospital Course: Sepsis with hypotension secondary to UTI POA: Sepsis physiology has resolved-blood cultures negative-she was already on antibiotics prior to admission-hence all cultures were likely sterile.    Has been on Rocephin since admission-does not need any further antimicrobial agents-even if she has UTI-she has been adequately treated.  Blood cultures remain negative-attending MD at SNF to follow until negative.  Mild AKI: Probably secondary to hypotension in the setting of sepsis.  Resolved with supportive care.  Acute metabolic encephalopathy superimposed on advanced dementia: Suspect secondary to sepsis/UTI/hypotension-improved with treatment of underlying sepsis.  Improved-likely she is not that far from her usual  baseline today  PAF: Sinus rhythm-clearly not a candidate for anticoagulation given advanced age/frailty and fall risk.  Hypothyroidism: Continue levothyroxine  Dementia: Pleasantly confused-at risk for delirium-continue trazodone, Effexor.  Evaluated by SLP-recommendations are for dysphagia 1 diet with nectar thick liquids.  Mechanical fall: She apparently fell out of her wheelchair while at ALF-PT eval completed-SNF on discharge.  Social work Ecologist discussion with her ALF-recommendations are for discharge to ALF with home health services (order)  Procedures None  Discharge Diagnoses:  Principal Problem:   Sepsis (HCC) Active Problems:   Generalized anxiety disorder   Dementia with behavioral problem (HCC)   PAF (paroxysmal atrial fibrillation) (HCC)   Hypothyroidism   Fall   AKI (acute kidney injury) (HCC)   Discharge Instructions:  Activity:  As tolerated with Full fall precautions use walker/cane & assistance as needed   Discharge Instructions     Complete by: As directed    Increase activity slowly   Complete by: As directed      Allergies as of 11/13/2019      Reactions   Penicillins Other (See Comments)   "Allergic," per [facility] Did it involve swelling of the face/tongue/throat, SOB, or low BP? Unk Did it involve sudden or severe rash/hives, skin peeling, or any reaction on the inside of your mouth or nose? Unk Did you need to seek medical attention at a hospital or doctor's office? Unk When did it last happen? Unk If all above answers are "NO", may proceed with cephalosporin use.   Bactrim [sulfamethoxazole-trimethoprim] Other (See Comments)   "Allergic," per [facility]   Sulfa Antibiotics Other (  See Comments)   "Allergic," per [facility]   Codeine Palpitations, Other (See Comments)   "Allergic," per [facility]      Medication List    STOP taking these medications   doxycycline 100 MG capsule Commonly known as: VIBRAMYCIN   levofloxacin 750 MG tablet Commonly  known as: LEVAQUIN     TAKE these medications   acetaminophen 650 MG CR tablet Commonly known as: TYLENOL Take 650 mg by mouth 3 (three) times daily.   ALPRAZolam 0.5 MG tablet Commonly known as: XANAX Take 1 tablet (0.5 mg total) by mouth 2 (two) times daily.   alum & mag hydroxide-simeth 200-200-20 MG/5ML suspension Commonly known as: MAALOX/MYLANTA Take 30 mLs by mouth every 8 (eight) hours as needed for indigestion or heartburn.   atorvastatin 10 MG tablet Commonly known as: LIPITOR Take 5 mg by mouth at bedtime.   Calcium Carbonate-Vitamin D 600-400 MG-UNIT tablet Take 1 tablet by mouth 2 (two) times daily.   levothyroxine 75 MCG tablet Commonly known as: SYNTHROID Take 75 mcg by mouth daily before breakfast.   loperamide 2 MG capsule Commonly known as: IMODIUM Take 2-4 mg by mouth See admin instructions. Take 2 capsules (4 mg) by mouth at first loose stool, then take 1 capsule (2 mg) every 2 hours as needed for diarrhea/loose stools   loratadine 10 MG tablet Commonly known as: CLARITIN Take 10 mg by mouth daily.   Melatonin 10 MG Tabs Take 10 mg by mouth at bedtime.   meloxicam 15 MG tablet Commonly known as: MOBIC Take 15 mg by mouth daily.   multivitamin with minerals Tabs tablet Take 1 tablet by mouth daily.   omeprazole 20 MG capsule Commonly known as: PRILOSEC Take 20 mg by mouth 2 (two) times daily.   polyethylene glycol 17 g packet Commonly known as: MIRALAX / GLYCOLAX Take 17 g by mouth daily as needed for mild constipation (MIX AND DRINK).   Salonpas Pain Relieving 4 % Ptch Generic drug: Lidocaine Apply 1 patch topically See admin instructions. Apply 1 patch to affected area(s) two times a day and remove, per schedule   senna 8.6 MG Tabs tablet Commonly known as: SENOKOT Take 1 tablet by mouth daily.   traZODone 150 MG tablet Commonly known as: DESYREL Take 75 mg by mouth at bedtime.   venlafaxine XR 75 MG 24 hr capsule Commonly known  as: EFFEXOR-XR Take 75 mg by mouth daily with breakfast.      Follow-up Information    primary care MD. Schedule an appointment as soon as possible for a visit in 1 week(s).          Allergies  Allergen Reactions  . Penicillins Other (See Comments)    "Allergic," per [facility] Did it involve swelling of the face/tongue/throat, SOB, or low BP? Unk Did it involve sudden or severe rash/hives, skin peeling, or any reaction on the inside of your mouth or nose? Unk Did you need to seek medical attention at a hospital or doctor's office? Unk When did it last happen? Unk If all above answers are "NO", may proceed with cephalosporin use.  . Bactrim [Sulfamethoxazole-Trimethoprim] Other (See Comments)    "Allergic," per [facility]  . Sulfa Antibiotics Other (See Comments)    "Allergic," per [facility]  . Codeine Palpitations and Other (See Comments)    "Allergic," per [facility]    Consultations:   None   Other Procedures/Studies: CT Head Wo Contrast  Result Date: 11/10/2019 CLINICAL DATA:  Patient found down today. Lethargy  for 1-2 days. Initial encounter. EXAM: CT HEAD WITHOUT CONTRAST CT CERVICAL SPINE WITHOUT CONTRAST TECHNIQUE: Multidetector CT imaging of the head and cervical spine was performed following the standard protocol without intravenous contrast. Multiplanar CT image reconstructions of the cervical spine were also generated. COMPARISON:  Head and cervical spine CT scans 08/13/2019. FINDINGS: CT HEAD FINDINGS Brain: No evidence of acute infarction, hemorrhage, hydrocephalus, extra-axial collection or mass lesion/mass effect. Atrophy and extensive chronic microvascular ischemic change are again seen. Vascular: No hyperdense vessel or unexpected calcification. Skull: Intact.  No focal lesion. Sinuses/Orbits: Negative. Other: None. CT CERVICAL SPINE FINDINGS Alignment: Maintained. Skull base and vertebrae: No acute fracture. No primary bone lesion or focal pathologic process. Mild, remote superior endplate  compression fracture of C7 is unchanged. Soft tissues and spinal canal: Negative. Disc levels:  Intervertebral disc space height is maintained. Upper chest: Negative. Other: None. IMPRESSION: No acute abnormality head or cervical spine. Atrophy and extensive chronic microvascular ischemic change. Electronically Signed   By: Drusilla Kanner M.D.   On: 11/10/2019 12:56   CT Cervical Spine Wo Contrast  Result Date: 11/10/2019 CLINICAL DATA:  Patient found down today. Lethargy for 1-2 days. Initial encounter. EXAM: CT HEAD WITHOUT CONTRAST CT CERVICAL SPINE WITHOUT CONTRAST TECHNIQUE: Multidetector CT imaging of the head and cervical spine was performed following the standard protocol without intravenous contrast. Multiplanar CT image reconstructions of the cervical spine were also generated. COMPARISON:  Head and cervical spine CT scans 08/13/2019. FINDINGS: CT HEAD FINDINGS Brain: No evidence of acute infarction, hemorrhage, hydrocephalus, extra-axial collection or mass lesion/mass effect. Atrophy and extensive chronic microvascular ischemic change are again seen. Vascular: No hyperdense vessel or unexpected calcification. Skull: Intact.  No focal lesion. Sinuses/Orbits: Negative. Other: None. CT CERVICAL SPINE FINDINGS Alignment: Maintained. Skull base and vertebrae: No acute fracture. No primary bone lesion or focal pathologic process. Mild, remote superior endplate compression fracture of C7 is unchanged. Soft tissues and spinal canal: Negative. Disc levels:  Intervertebral disc space height is maintained. Upper chest: Negative. Other: None. IMPRESSION: No acute abnormality head or cervical spine. Atrophy and extensive chronic microvascular ischemic change. Electronically Signed   By: Drusilla Kanner M.D.   On: 11/10/2019 12:56   CT ABDOMEN PELVIS W CONTRAST  Result Date: 11/10/2019 CLINICAL DATA:  Right upper quadrant and lower quadrant pain. EXAM: CT ABDOMEN AND PELVIS WITH CONTRAST TECHNIQUE:  Multidetector CT imaging of the abdomen and pelvis was performed using the standard protocol following bolus administration of intravenous contrast. CONTRAST:  41mL OMNIPAQUE IOHEXOL 300 MG/ML  SOLN COMPARISON:  None. FINDINGS: Lower chest: Basilar atelectasis on the right. Hepatobiliary: No suspicious focal abnormality within the liver parenchyma. Mild intra and extrahepatic biliary duct dilatation. Extrahepatic common bile duct measures 8 mm diameter in the head of the pancreas, upper normal for patient age. Nonvisualization of the gallbladder suggest cholecystectomy. Pancreas: No focal mass lesion. No dilatation of the main duct. No intraparenchymal cyst. No peripancreatic edema. Spleen: No splenomegaly. No focal mass lesion. Adrenals/Urinary Tract: No adrenal nodule or mass. 8.2 x 6.1 x 9.1 cm exophytic cyst noted lower pole right kidney. 9 mm hypoattenuating lesion in the upper pole right kidney is too small to characterize but likely a cyst. 16 mm cyst noted upper pole left kidney with adjacent 6 mm subcapsular low-density lesion, also likely a cyst. No evidence for hydroureter. The urinary bladder appears normal for the degree of distention. Stomach/Bowel: Large hiatal hernia. 50-75% of the stomach is in the chest. Duodenum is normally  positioned as is the ligament of Treitz. No small bowel wall thickening. No small bowel dilatation. The terminal ileum is normal. The appendix is not visualized, but there is no edema or inflammation in the region of the cecum. No gross colonic mass. No colonic wall thickening. Vascular/Lymphatic: There is abdominal aortic atherosclerosis without aneurysm. There is no gastrohepatic or hepatoduodenal ligament lymphadenopathy. No retroperitoneal or mesenteric lymphadenopathy. No pelvic sidewall lymphadenopathy. Reproductive: Hysterectomy.  There is no adnexal mass. Other: No intraperitoneal free fluid. Musculoskeletal: Bones are diffusely demineralized. No worrisome lytic or  sclerotic osseous abnormality. Status post vertebral augmentation at T8 and T12. Superior endplate compression deformity noted at T11. IMPRESSION: 1. No acute findings in the abdomen or pelvis. 2. Large hiatal hernia with 50-75% of the stomach in the chest. 3. Mild prominence of the intrahepatic bile ducts with extrahepatic common bile duct measuring upper normal for patient age. 4.  Aortic Atherosclerois (ICD10-170.0) Electronically Signed   By: Misty Stanley M.D.   On: 11/10/2019 14:42   DG Chest Portable 1 View  Result Date: 11/10/2019 CLINICAL DATA:  Fall. EXAM: PORTABLE CHEST 1 VIEW COMPARISON:  08/13/2019 FINDINGS: 11:31 a.m. The lungs are clear without focal pneumonia, edema, pneumothorax or pleural effusion. Low volumes. Interstitial markings are diffusely coarsened with chronic features. The cardio pericardial silhouette is enlarged. Large hiatal hernia. Patient is status post midthoracic vertebral augmentation. Bones are diffusely demineralized. Telemetry leads overlie the chest. IMPRESSION: Low volumes without acute cardiopulmonary findings. Large hiatal hernia. Electronically Signed   By: Misty Stanley M.D.   On: 11/10/2019 11:45     TODAY-DAY OF DISCHARGE:  Subjective:   Reesa Chew today remains pleasantly confused-Per nursing staff-no major events overnight.  Objective:   Blood pressure 140/80, pulse 80, temperature 97.8 F (36.6 C), temperature source Oral, resp. rate 15, weight 70.5 kg, SpO2 97 %.  Intake/Output Summary (Last 24 hours) at 11/13/2019 1350 Last data filed at 11/13/2019 1300 Gross per 24 hour  Intake 600 ml  Output 453 ml  Net 147 ml   Filed Weights   11/10/19 2049 11/11/19 2240  Weight: 71 kg 70.5 kg    Exam: Awake Alert, Oriented *3, No new F.N deficits, Normal affect [place].AT,PERRAL Supple Neck,No JVD, No cervical lymphadenopathy appriciated.  Symmetrical Chest wall movement, Good air movement bilaterally, CTAB RRR,No Gallops,Rubs or new Murmurs, No  Parasternal Heave +ve B.Sounds, Abd Soft, Non tender, No organomegaly appriciated, No rebound -guarding or rigidity. No Cyanosis, Clubbing or edema, No new Rash or bruise   PERTINENT RADIOLOGIC STUDIES: No results found.   PERTINENT LAB RESULTS: CBC: Recent Labs    11/11/19 0625 11/12/19 0601  WBC 8.7 8.8  HGB 10.8* 11.1*  HCT 34.9* 35.7*  PLT 331 343   CMET CMP     Component Value Date/Time   NA 141 11/12/2019 0601   K 4.1 11/12/2019 0601   CL 110 11/12/2019 0601   CO2 24 11/12/2019 0601   GLUCOSE 101 (H) 11/12/2019 0601   BUN 20 11/12/2019 0601   CREATININE 0.97 11/12/2019 0601   CALCIUM 9.2 11/12/2019 0601   PROT 5.5 (L) 11/11/2019 0625   ALBUMIN 3.0 (L) 11/11/2019 0625   AST 14 (L) 11/11/2019 0625   ALT 12 11/11/2019 0625   ALKPHOS 67 11/11/2019 0625   BILITOT 0.5 11/11/2019 0625   GFRNONAA 51 (L) 11/12/2019 0601   GFRAA 60 (L) 11/12/2019 0601    GFR Estimated Creatinine Clearance: 34.6 mL/min (by C-G formula based on SCr of 0.97 mg/dL).  No results for input(s): LIPASE, AMYLASE in the last 72 hours. No results for input(s): CKTOTAL, CKMB, CKMBINDEX, TROPONINI in the last 72 hours. Invalid input(s): POCBNP No results for input(s): DDIMER in the last 72 hours. Recent Labs    11/11/19 0625  HGBA1C 5.9*   No results for input(s): CHOL, HDL, LDLCALC, TRIG, CHOLHDL, LDLDIRECT in the last 72 hours. No results for input(s): TSH, T4TOTAL, T3FREE, THYROIDAB in the last 72 hours.  Invalid input(s): FREET3 No results for input(s): VITAMINB12, FOLATE, FERRITIN, TIBC, IRON, RETICCTPCT in the last 72 hours. Coags: Recent Labs    11/10/19 1849  INR 1.1   Microbiology: Recent Results (from the past 240 hour(s))  Respiratory Panel by RT PCR (Flu A&B, Covid) - Nasopharyngeal Swab     Status: None   Collection Time: 11/10/19  3:00 PM   Specimen: Nasopharyngeal Swab  Result Value Ref Range Status   SARS Coronavirus 2 by RT PCR NEGATIVE NEGATIVE Final    Comment:  (NOTE) SARS-CoV-2 target nucleic acids are NOT DETECTED. The SARS-CoV-2 RNA is generally detectable in upper respiratoy specimens during the acute phase of infection. The lowest concentration of SARS-CoV-2 viral copies this assay can detect is 131 copies/mL. A negative result does not preclude SARS-Cov-2 infection and should not be used as the sole basis for treatment or other patient management decisions. A negative result may occur with  improper specimen collection/handling, submission of specimen other than nasopharyngeal swab, presence of viral mutation(s) within the areas targeted by this assay, and inadequate number of viral copies (<131 copies/mL). A negative result must be combined with clinical observations, patient history, and epidemiological information. The expected result is Negative. Fact Sheet for Patients:  https://www.moore.com/ Fact Sheet for Healthcare Providers:  https://www.young.biz/ This test is not yet ap proved or cleared by the [place] FDA and  has been authorized for detection and/or diagnosis of SARS-CoV-2 by FDA under an Emergency Use Authorization (EUA). This EUA will remain  in effect (meaning this test can be used) for the duration of the COVID-19 declaration under Section 564(b)(1) of the Act, 21 U.S.C. section 360bbb-3(b)(1), unless the authorization is terminated or revoked sooner.    Influenza A by PCR NEGATIVE NEGATIVE Final   Influenza B by PCR NEGATIVE NEGATIVE Final    Comment: (NOTE) The Xpert Xpress SARS-CoV-2/FLU/RSV assay is intended as an aid in  the diagnosis of influenza from Nasopharyngeal swab specimens and  should not be used as a sole basis for treatment. Nasal washings and  aspirates are unacceptable for Xpert Xpress SARS-CoV-2/FLU/RSV  testing. Fact Sheet for Patients: https://www.moore.com/ Fact Sheet for Healthcare  Providers: https://www.young.biz/ This test is not yet approved or cleared by the [place] FDA and  has been authorized for detection and/or diagnosis of SARS-CoV-2 by  FDA under an Emergency Use Authorization (EUA). This EUA will remain  in effect (meaning this test can be used) for the duration of the  Covid-19 declaration under Section 564(b)(1) of the Act, 21  U.S.C. section 360bbb-3(b)(1), unless the authorization is  terminated or revoked. Performed at [facility], [address], [place], [place] [address]   Urine culture     Status: None   Collection Time: 11/10/19  3:35 PM   Specimen: Urine, Random  Result Value Ref Range Status   Specimen Description URINE, RANDOM  Final   Special Requests NONE  Final   Culture   Final    NO GROWTH Performed at [facility], [address] [place], [place] [address]    Report Status 11/11/2019 FINAL  Final  Blood culture (routine x 2)     Status: None (Preliminary result)   Collection Time: 11/10/19  3:50 PM   Specimen: BLOOD  Result Value Ref Range Status   Specimen Description BLOOD RIGHT ANTECUBITAL  Final   Special Requests   Final    BOTTLES DRAWN AEROBIC AND ANAEROBIC Blood Culture adequate volume   Culture   Final    NO GROWTH 3 DAYS Performed at [facility], [address] [place], [place] [address]    Report Status PENDING  Incomplete  Blood culture (routine x 2)     Status: None (Preliminary result)   Collection Time: 11/10/19  4:00 PM   Specimen: BLOOD  Result Value Ref Range Status   Specimen Description BLOOD LEFT ANTECUBITAL  Final   Special Requests   Final    BOTTLES DRAWN AEROBIC AND ANAEROBIC Blood Culture adequate volume   Culture   Final    NO GROWTH 3 DAYS Performed at [facility], [address] [place], [place] [address]    Report Status PENDING  Incomplete  Culture, blood (x 2)     Status: None (Preliminary result)   Collection Time: 11/10/19  6:49  PM   Specimen: BLOOD  Result Value Ref Range Status   Specimen Description BLOOD RIGHT ANTECUBITAL  Final   Special Requests   Final    BOTTLES DRAWN AEROBIC AND ANAEROBIC Blood Culture results may not be optimal due to an inadequate volume of blood received in culture bottles   Culture   Final    NO GROWTH 3 DAYS Performed at [facility], [address] [place], [place] [address]    Report Status PENDING  Incomplete  Culture, blood (x 2)     Status: None (Preliminary result)   Collection Time: 11/10/19  6:57 PM   Specimen: BLOOD LEFT HAND  Result Value Ref Range Status   Specimen Description BLOOD LEFT HAND  Final   Special Requests   Final    BOTTLES DRAWN AEROBIC AND ANAEROBIC Blood Culture results may not be optimal due to an inadequate volume of blood received in culture bottles   Culture   Final    NO GROWTH 3 DAYS Performed at [facility], [address] [place], [place] [address]    Report Status PENDING  Incomplete  SARS CORONAVIRUS 2 (TAT 6-24 HRS) Nasopharyngeal Nasopharyngeal Swab     Status: None   Collection Time: 11/12/19  8:42 AM   Specimen: Nasopharyngeal Swab  Result Value Ref Range Status   SARS Coronavirus 2 NEGATIVE NEGATIVE Final    Comment: (NOTE) SARS-CoV-2 target nucleic acids are NOT DETECTED. The SARS-CoV-2 RNA is generally detectable in upper and lower respiratory specimens during the acute phase of infection. Negative results do not preclude SARS-CoV-2 infection, do not rule out co-infections with other pathogens, and should not be used as the sole basis for treatment or other patient management decisions. Negative results must be combined with clinical observations, patient history, and epidemiological information. The expected result is Negative. Fact Sheet for Patients: HairSlick.nohttps://www.fda.gov/media/138098/download Fact Sheet for Healthcare Providers: quierodirigir.comhttps://www.fda.gov/media/138095/download This test is not yet approved or  cleared by the [place] FDA and  has been authorized for detection and/or diagnosis of SARS-CoV-2 by FDA under an Emergency Use Authorization (EUA). This EUA will remain  in effect (meaning this test can be used) for  the duration of the COVID-19 declaration under Section 56 4(b)(1) of the Act, 21 U.S.C. section 360bbb-3(b)(1), unless the authorization is terminated or revoked sooner. Performed at [facility], [address] [place], [place] [address]     FURTHER DISCHARGE INSTRUCTIONS:  Get Medicines reviewed and adjusted: Please take all your medications with you for your next visit with your Primary MD  Laboratory/radiological data: Please request your Primary MD to go over all hospital tests and procedure/radiological results at the follow up, please ask your Primary MD to get all Hospital records sent to his/her office.  In some cases, they will be blood work, cultures and biopsy results pending at the time of your discharge. Please request that your primary care M.D. goes through all the records of your hospital data and follows up on these results.  Also Note the following: If you experience worsening of your admission symptoms, develop shortness of breath, life threatening emergency, suicidal or homicidal thoughts you must seek medical attention immediately by calling 911 or calling your MD immediately  if symptoms less severe.  You must read complete instructions/literature along with all the possible adverse reactions/side effects for all the Medicines you take and that have been prescribed to you. Take any new Medicines after you have completely understood and accpet all the possible adverse reactions/side effects.   Do not drive when taking Pain medications or sleeping medications (Benzodaizepines)  Do not take more than prescribed Pain, Sleep and Anxiety Medications. It is not advisable to combine anxiety,sleep and pain medications without talking with your  primary care practitioner  Special Instructions: If you have smoked or chewed Tobacco  in the last 2 yrs please stop smoking, stop any regular Alcohol  and or any Recreational drug use.  Wear Seat belts while driving.  Please note: You were cared for by a hospitalist during your hospital stay. Once you are discharged, your primary care physician will handle any further medical issues. Please note that NO REFILLS for any discharge medications will be authorized once you are discharged, as it is imperative that you return to your primary care physician (or establish a relationship with a primary care physician if you do not have one) for your post hospital discharge needs so that they can reassess your need for medications and monitor your lab values.  Total Time spent coordinating discharge including counseling, education and face to face time equals 35 minutes.  Signed: Dora Clauss 11/13/2019 1:50 PM

## 2019-11-13 NOTE — Progress Notes (Signed)
Patient discharged to [facility] by [facility]. Belonging sent with patient. Gina Hodges, Gina Hodges, Charity fundraiser

## 2019-11-13 NOTE — TOC Transition Note (Signed)
Transition of Care [facility]) - CM/SW Discharge Note *11/13/19 - Discharged back to [facility] via non-emergency ambulance   Patient Details  Name: Gina Hodges MRN: [id number] Date of Birth: 01-15-1927  Transition of Care [facility]) CM/SW Contact:  Cristobal Goldmann, LCSW Phone Number: 11/13/2019, 3:09 PM   Clinical Narrative:  Patient medically stable for discharge today and contact made with Kirstie Peri at ALF regarding patient's d/c. Discharge clinicals transmitted to facility and reviewed. MD signed FL-2. Loa Socks [phone]) contacted and informed of today's discharge. Non-emergency ambulance transport arranged.   Final next level of care: Assisted Living([facility]) Barriers to Discharge: Barriers Resolved   Patient Goals and CMS Choice Patient states their goals for this hospitalization and ongoing recovery are:: Nephew advised of return to [facility].gov Compare Post Acute Care list provided to:: Patient Represenative (must comment)(Medicare.gov not needed as patient returning to ALF) Choice offered to / list presented to : NA  Discharge Placement              Patient chooses bed at: [facility]) Patient to be transferred to facility by: Non-emergency ambulance Name of family member notified: Loa Socks - [phone] Patient and family notified of of transfer: 11/13/19  Discharge Plan and Services In-house Referral: Clinical Social Work Discharge Planning Services: CM Consult                      HH Arranged: PT, OT HH Agency: [facility] Date [facility] Agency Contacted: 11/12/19 Time HH Agency Contacted: 1326 Representative spoke with at [facility] Agency: Angela Adam  Social Determinants of Health (SDOH) Interventions  No SDOH interventions needed or requested prior to discharge.    Readmission Risk Interventions Readmission Risk Prevention Plan 11/12/2019  Post Dischage Appt Not Complete  Appt Comments facility resident   Medication Screening Complete  Transportation Screening Complete  Some recent data might be hidden

## 2019-11-13 NOTE — Plan of Care (Signed)
  Problem: Fluid Volume: Goal: Hemodynamic stability will improve Outcome: Progressing   Problem: Clinical Measurements: Goal: Ability to maintain clinical measurements within normal limits will improve Outcome: Progressing   Problem: Safety: Goal: Ability to remain free from injury will improve Outcome: Progressing

## 2019-11-13 NOTE — Plan of Care (Signed)
  Problem: Clinical Measurements: Goal: Diagnostic test results will improve Outcome: Progressing   

## 2019-11-13 NOTE — Progress Notes (Signed)
Modified Barium Swallow Progress Note  Patient Details  Name: Gina Hodges MRN: [id number] Date of Birth: [birth year]  Today's Date: 11/13/2019  Modified Barium Swallow completed.  Full report located under Chart Review in the Imaging Section.  Brief recommendations include the following:  Clinical Impression  Pt demonstrates incomplete laryngeal closure during the swallow intermittently, leading to instances of sensed penetration to the cords with nectar and thin. Pt does not fully eject this penetrate despite slight throat clearing efforts, and thin liquids do slightly spill into the subglottis. Excessive verbal cueing, due to hearing impairment, were needed to increase cough strength to eject aspirate. Of note, at bedside, when drinking thin liquids, pt did have strong involuntary coughing. With nectar thick liquids, there were instances of normal swallowing, even with consecutive straw sips, but also penetration events, with increased spontaneous ejection with subsequent swallows and throat clearing. Overall, pt is at increased risk of aspiration with thin liquids, but would benefit from training for a preventative hard cough or throat clear to reinforce airway protection. For now, will initiate a puree/nectar thick diet with recommended f/u for eventual resumption of thin liquids as pt continues to recover acutely.    Swallow Evaluation Recommendations       SLP Diet Recommendations: Dysphagia 1 (Puree) solids;Nectar thick liquid   Liquid Administration via: Cup;Straw   Medication Administration: Whole meds with puree   Supervision: Staff to assist with self feeding   Compensations: Slow rate;Small sips/bites;Clear throat intermittently;Hard cough after swallow       Oral Care Recommendations: Oral care BID   Other Recommendations: Order thickener from pharmacy   Harlon Ditty, MA CCC-SLP  Acute Rehabilitation Services Pager [phone] Office [phone]  Claudine Mouton 11/13/2019,9:37 AM

## 2019-11-13 NOTE — Progress Notes (Signed)
Called and gave report to Facility [facility].   Lillia PaulsRN

## 2019-11-15 ENCOUNTER — Other Ambulatory Visit: Payer: Self-pay

## 2019-11-15 ENCOUNTER — Emergency Department (HOSPITAL_COMMUNITY)
Admission: EM | Admit: 2019-11-15 | Discharge: 2019-11-15 | Disposition: A | Discharge status: Home / Self Care | Payer: Medicare HMO | Attending: Emergency Medicine | Admitting: Emergency Medicine

## 2019-11-15 ENCOUNTER — Emergency Department (HOSPITAL_COMMUNITY): Payer: Medicare HMO

## 2019-11-15 ENCOUNTER — Encounter (HOSPITAL_COMMUNITY): Payer: Self-pay | Admitting: Emergency Medicine

## 2019-11-15 DIAGNOSIS — I480 Paroxysmal atrial fibrillation: Secondary | ICD-10-CM | POA: Diagnosis not present

## 2019-11-15 DIAGNOSIS — Z8673 Personal history of transient ischemic attack (TIA), and cerebral infarction without residual deficits: Secondary | ICD-10-CM | POA: Insufficient documentation

## 2019-11-15 DIAGNOSIS — R55 Syncope and collapse: Secondary | ICD-10-CM

## 2019-11-15 DIAGNOSIS — F0390 Unspecified dementia without behavioral disturbance: Secondary | ICD-10-CM | POA: Diagnosis not present

## 2019-11-15 DIAGNOSIS — Z79899 Other long term (current) drug therapy: Secondary | ICD-10-CM | POA: Diagnosis not present

## 2019-11-15 HISTORY — DX: Cerebral infarction, unspecified: I63.9

## 2019-11-15 LAB — CULTURE, BLOOD (ROUTINE X 2)
Culture: NO GROWTH
Culture: NO GROWTH
Culture: NO GROWTH
Culture: NO GROWTH
Special Requests: ADEQUATE
Special Requests: ADEQUATE

## 2019-11-15 LAB — CBC WITH DIFFERENTIAL/PLATELET
Abs Immature Granulocytes: 0.04 10*3/uL (ref 0.00–0.07)
Basophils Absolute: 0 10*3/uL (ref 0.0–0.1)
Basophils Relative: 0 %
Eosinophils Absolute: 0.5 10*3/uL (ref 0.0–0.5)
Eosinophils Relative: 6 %
HCT: 34.7 % — ABNORMAL LOW (ref 36.0–46.0)
Hemoglobin: 10.5 g/dL — ABNORMAL LOW (ref 12.0–15.0)
Immature Granulocytes: 1 %
Lymphocytes Relative: 19 %
Lymphs Abs: 1.6 10*3/uL (ref 0.7–4.0)
MCH: 27.5 pg (ref 26.0–34.0)
MCHC: 30.3 g/dL (ref 30.0–36.0)
MCV: 90.8 fL (ref 80.0–100.0)
Monocytes Absolute: 1 10*3/uL (ref 0.1–1.0)
Monocytes Relative: 11 %
Neutro Abs: 5.6 10*3/uL (ref 1.7–7.7)
Neutrophils Relative %: 63 %
Platelets: 324 10*3/uL (ref 150–400)
RBC: 3.82 MIL/uL — ABNORMAL LOW (ref 3.87–5.11)
RDW: 15 % (ref 11.5–15.5)
WBC: 8.8 10*3/uL (ref 4.0–10.5)
nRBC: 0 % (ref 0.0–0.2)

## 2019-11-15 LAB — COMPREHENSIVE METABOLIC PANEL
ALT: 11 U/L (ref 0–44)
AST: 13 U/L — ABNORMAL LOW (ref 15–41)
Albumin: 3 g/dL — ABNORMAL LOW (ref 3.5–5.0)
Alkaline Phosphatase: 82 U/L (ref 38–126)
Anion gap: 9 (ref 5–15)
BUN: 18 mg/dL (ref 8–23)
CO2: 28 mmol/L (ref 22–32)
Calcium: 9.1 mg/dL (ref 8.9–10.3)
Chloride: 104 mmol/L (ref 98–111)
Creatinine, Ser: 0.8 mg/dL (ref 0.44–1.00)
GFR calc Af Amer: >60 mL/min (ref >60)
GFR calc non Af Amer: >60 mL/min (ref >60)
Glucose, Bld: 110 mg/dL — ABNORMAL HIGH (ref 70–99)
Potassium: 4 mmol/L (ref 3.5–5.1)
Sodium: 141 mmol/L (ref 135–145)
Total Bilirubin: 0.6 mg/dL (ref 0.3–1.2)
Total Protein: 5.7 g/dL — ABNORMAL LOW (ref 6.5–8.1)

## 2019-11-15 LAB — TROPONIN I (HIGH SENSITIVITY): Troponin I (High Sensitivity): 5 ng/L (ref <18)

## 2019-11-15 NOTE — ED Triage Notes (Signed)
Pt arrives by [facility] with c/o a syncopal episode. Pt from [facility] in [place] when she was being assisted by 2 healthcare workers. Pt was in the shower when she went  Unresponsive. Workers moved her from Paediatric nurse to floor. Reports loss of bowels. Episode lasted about 1-25 minutes.  No head trauma reported. Baseline pt is a/o X4  B/p 148/64 98% RA rr 16 HR 78

## 2019-11-15 NOTE — Discharge Instructions (Addendum)
The cause of Gina Hodges's syncopal event was not identified today. Please have her follow-up with her family doctor and cardiology. Please have her rechecked if she has additional events.

## 2019-11-15 NOTE — ED Provider Notes (Signed)
[facility] EMERGENCY DEPARTMENT Provider Note   CSN: [id number] Arrival date & time: 11/15/19  1237     History Chief Complaint  Patient presents with  . Loss of Consciousness    Gina Hodges is a 86 y.o. female.  The history is provided by the patient, the EMS personnel, medical records and the nursing home. No language interpreter was used.  Loss of Consciousness  Gina Hodges is a 86 y.o. female who presents to the Emergency Department complaining of syncope. Level V caveat due to dementia. History is provided by EMS and nursing facility. She presents to the emergency department following a syncopal event at her facility today. She is a resident [facility] in [place]. She was being assisted by two healthcare workers in the shower when she became unresponsive. She was eased down to the floor and did not have a fall. She was unresponsive for 1 to 2 minutes. No generalized activity. She was recently admitted to the hospital for UTI. Patient denies any complaints on ED arrival. She did have loss of bowels during the event. No hematochezia or melena.   Past Medical History:  Diagnosis Date  . Anemia   . Anxiety   . Cerebral infarct (HCC)   . Dementia (HCC)   . Dementia with behavioral problem (HCC)   . GERD (gastroesophageal reflux disease)   . Hyperlipidemia   . Osteoporosis   . PAF (paroxysmal atrial fibrillation) (HCC)   . Stroke (HCC)   . Thyroid disease   . Ulcerative esophagitis   . UTI (urinary tract infection)   . Vertigo     Patient Active Problem List   Diagnosis Date Noted  . Sepsis (HCC) 11/10/2019  . AKI (acute kidney injury) (HCC) 11/10/2019  . Fall 08/13/2019  . Ingrown nail 11/12/2013  . Compression fracture of spine (HCC) 11/07/2013  . Hypothyroidism 11/07/2013  . Hyperlipidemia   . Dementia with behavioral problem (HCC)   . GERD (gastroesophageal reflux disease)   . PAF (paroxysmal atrial fibrillation) (HCC)   .  Generalized anxiety disorder 04/05/2013    Past Surgical History:  Procedure Laterality Date  . ABDOMINAL HYSTERECTOMY    . APPENDECTOMY    . CHOLECYSTECTOMY       OB History   No obstetric history on file.     Family History  Problem Relation Age of Onset  . Depression Mother   . Bipolar disorder Daughter   . Drug abuse Son     Social History   Tobacco Use  . Smoking status: Never Smoker  . Smokeless tobacco: Never Used  Substance Use Topics  . Alcohol use: No  . Drug use: No    Home Medications Prior to Admission medications   Medication Sig Start Date End Date Taking? Authorizing Provider  acetaminophen (TYLENOL) 650 MG CR tablet Take 650 mg by mouth 3 (three) times daily.     [provider]  ALPRAZolam Prudy Feeler) 0.5 MG tablet Take 1 tablet (0.5 mg total) by mouth 2 (two) times daily. 11/13/19   Ghimire, Werner Lean, MD  alum & mag hydroxide-simeth (MAALOX/MYLANTA) 200-200-20 MG/5ML suspension Take 30 mLs by mouth every 8 (eight) hours as needed for indigestion or heartburn.     [provider]  atorvastatin (LIPITOR) 10 MG tablet Take 5 mg by mouth at bedtime.  02/27/13   [provider]  Calcium Carbonate-Vitamin D 600-400 MG-UNIT tablet Take 1 tablet by mouth 2 (two) times daily.  [provider]  levothyroxine (SYNTHROID, LEVOTHROID) 75 MCG tablet Take 75 mcg by mouth daily before breakfast.  02/27/13   [provider]  Lidocaine (SALONPAS PAIN RELIEVING) 4 % PTCH Apply 1 patch topically See admin instructions. Apply 1 patch to affected area(s) two times a day and remove, per schedule    [provider]  loperamide (IMODIUM) 2 MG capsule Take 2-4 mg by mouth See admin instructions. Take 2 capsules (4 mg) by mouth at first loose stool, then take 1 capsule (2 mg) every 2 hours as needed for diarrhea/loose stools 11/08/18   [provider]  loratadine (CLARITIN) 10 MG tablet Take 10 mg by mouth daily.    [provider]  Melatonin 10 MG TABS Take 10 mg by mouth at bedtime.     [provider]  meloxicam (MOBIC) 15 MG tablet Take 15 mg by mouth daily. 10/30/18   [provider]  Multiple Vitamin (MULTIVITAMIN WITH MINERALS) TABS tablet Take 1 tablet by mouth daily.     [provider]  omeprazole (PRILOSEC) 20 MG capsule Take 20 mg by mouth 2 (two) times daily. 10/17/18   [provider]  polyethylene glycol (MIRALAX / GLYCOLAX) packet Take 17 g by mouth daily as needed for mild constipation ([place]).     [provider]  senna (SENOKOT) 8.6 MG TABS tablet Take 1 tablet by mouth daily.    [provider]  traZODone (DESYREL) 150 MG tablet Take 75 mg by mouth at bedtime. 10/27/18   [provider]  venlafaxine XR (EFFEXOR-XR) 75 MG 24 hr capsule Take 75 mg by mouth daily with breakfast.    [provider]    Allergies    Penicillins, Bactrim [sulfamethoxazole-trimethoprim], Sulfa antibiotics, and Codeine  Review of Systems   Review of Systems  Cardiovascular: Positive for syncope.  All other systems reviewed and are negative.   Physical Exam Updated Vital Signs BP (!) 149/81   Pulse 89   Temp 98.2 F (36.8 C) (Axillary)   Resp 18   SpO2 97%   Physical Exam Vitals and nursing note reviewed.  Constitutional:      Appearance: She is well-developed.  HENT:     Head: Normocephalic and atraumatic.  Cardiovascular:     Rate and Rhythm: Normal rate and regular rhythm.     Heart sounds: No murmur.  Pulmonary:     Effort: Pulmonary effort is normal. No respiratory distress.     Breath sounds: Normal breath sounds.  Abdominal:     Palpations: Abdomen is soft.     Tenderness: There is no abdominal tenderness. There is no guarding or rebound.  Musculoskeletal:        General: No tenderness.  Skin:    General: Skin is warm and dry.  Neurological:     Mental Status: She is alert.     Comments: Very hard of  hearing. Disoriented to place, time in recent events. Five out of five strength in all four extremities. No asymmetry of facial movements.  Psychiatric:        Behavior: Behavior normal.     ED Results / Procedures / Treatments   Labs (all labs ordered are listed, but only abnormal results are displayed) Labs Reviewed  COMPREHENSIVE METABOLIC PANEL - Abnormal; Notable for the following components:      Result Value   Glucose, Bld 110 (*)    Total Protein 5.7 (*)    Albumin 3.0 (*)  AST 13 (*)    All other components within normal limits  CBC WITH DIFFERENTIAL/PLATELET - Abnormal; Notable for the following components:   RBC 3.82 (*)    Hemoglobin 10.5 (*)    HCT 34.7 (*)    All other components within normal limits  TROPONIN I (HIGH SENSITIVITY)    EKG EKG Interpretation  Date/Time:  Thursday November 15 2019 12:47:15 EST Ventricular Rate:  79 PR Interval:    QRS Duration: 91 QT Interval:  370 QTC Calculation: 425 R Axis:   -12 Text Interpretation: Sinus rhythm Low voltage, precordial leads Confirmed by Tilden Fossa [phone]) on 11/15/2019 1:03:42 PM   Radiology CT Head Wo Contrast  Result Date: 11/15/2019 CLINICAL DATA:  Syncope EXAM: CT HEAD WITHOUT CONTRAST TECHNIQUE: Contiguous axial images were obtained from the base of the skull through the vertex without intravenous contrast. COMPARISON:  11/10/2019 FINDINGS: Brain: No evidence of acute infarction, hemorrhage, hydrocephalus, extra-axial collection or mass lesion/mass effect. Extensive periventricular and deep white matter hypodensity with global volume loss. Vascular: No hyperdense vessel or unexpected calcification. Skull: Normal. Negative for fracture or focal lesion. Sinuses/Orbits: No acute finding. Other: None. IMPRESSION: No acute intracranial pathology. Advanced small-vessel white matter disease and global volume loss. Electronically Signed   By: Lauralyn Primes M.D.   On: 11/15/2019 15:56     Procedures Procedures (including critical care time)  Medications Ordered in ED Medications - No data to display  ED Course  I have reviewed the triage vital signs and the nursing notes.  Pertinent labs & imaging results that were available during my care of the patient were reviewed by me and considered in my medical decision making (see chart for details).    MDM Rules/Calculators/A&P                     Patient here for evaluation following brief syncopal event where she quickly returned to her baseline. She is asymptomatic on ED evaluation. She was recently treated with Levaquin for pneumonia but completed the treatment two days ago. Presentation is not consistent with subarachnoid hemorrhage, CT head is negative. Presentation is not consistent with PE. Event does not sound like seizure activity. Plan to discharge back to facility with outpatient follow-up and return precautions.  Final Clinical Impression(s) / ED Diagnoses Final diagnoses:  Syncope, unspecified syncope type    Rx / DC Orders ED Discharge Orders         Ordered    Ambulatory referral to Cardiology     11/15/19 1620           Tilden Fossa, MD 11/15/19 1621

## 2019-11-27 ENCOUNTER — Ambulatory Visit (INDEPENDENT_AMBULATORY_CARE_PROVIDER_SITE_OTHER): Payer: Medicare HMO | Admitting: Cardiology

## 2019-11-27 ENCOUNTER — Encounter: Payer: Self-pay | Admitting: Cardiology

## 2019-11-27 ENCOUNTER — Telehealth: Payer: Self-pay | Admitting: *Deleted

## 2019-11-27 ENCOUNTER — Other Ambulatory Visit: Payer: Self-pay

## 2019-11-27 VITALS — BP 136/69 | HR 75 | Temp 96.6°F | Wt 151.0 lb

## 2019-11-27 DIAGNOSIS — I480 Paroxysmal atrial fibrillation: Secondary | ICD-10-CM | POA: Diagnosis not present

## 2019-11-27 DIAGNOSIS — F0390 Unspecified dementia without behavioral disturbance: Secondary | ICD-10-CM | POA: Diagnosis not present

## 2019-11-27 DIAGNOSIS — R55 Syncope and collapse: Secondary | ICD-10-CM | POA: Diagnosis not present

## 2019-11-27 DIAGNOSIS — Z8673 Personal history of transient ischemic attack (TIA), and cerebral infarction without residual deficits: Secondary | ICD-10-CM

## 2019-11-27 DIAGNOSIS — I491 Atrial premature depolarization: Secondary | ICD-10-CM

## 2019-11-27 NOTE — Patient Instructions (Signed)
Medication Instructions:  Your Physician recommend you continue on your current medication as directed.    *If you need a refill on your cardiac medications before your next appointment, please call your pharmacy*  Lab Work: None  Testing/Procedures: Your physician has requested that you have an echocardiogram. Echocardiography is a painless test that uses sound waves to create images of your heart. It provides your doctor with information about the size and shape of your heart and how well your heart's chambers and valves are working. This procedure takes approximately one hour. There are no restrictions for this procedure. [address]  Your physician has recommended that you wear an event monitor. Event monitors are medical devices that record the heart's electrical activity. Doctors most often [place] these monitors to diagnose arrhythmias. Arrhythmias are problems with the speed or rhythm of the heartbeat. The monitor is a small, portable device. You can wear one while you do your normal daily activities. This is usually used to diagnose what is causing palpitations/syncope (passing out).   Follow-Up: At [facility], you and your health needs are our priority.  As part of our continuing mission to provide you with exceptional heart care, we have created designated Provider Care Teams.  These Care Teams include your primary Cardiologist (physician) and Advanced Practice Providers (APPs -  Physician Assistants and Nurse Practitioners) who all work together to provide you with the care you need, when you need it.  Your next appointment:   Based on test results  The format for your next appointment:   Either In Person or Virtual  Provider:   Jodelle Red, MD

## 2019-11-27 NOTE — Progress Notes (Signed)
Cardiology Office Note:    Date:  11/27/2019   ID:  Gina Hodges, DOB 1927/05/27, MRN [id number]  PCP:  Patient, No Pcp Per  Cardiologist:  Jodelle Red, MD  Referring MD: Tilden Fossa, MD   CC: new patient evaluation for syncope.  History of Present Illness:    Gina Hodges is a 86 y.o. female with a hx of atrial fibrillation not on anticoagulation due to falls, CVA, dementia who is seen as a new consult at the request of Tilden Fossa, MD for the evaluation and management of syncope.  She is here today with an Geophysicist/field seismologist from her nursing facility. The patient is hard of hearing and cannot provide any history. This is her baseline, confirmed with the nursing facility assistant. She denies any current pain. Does not recall syncopal event. Is wheelchair bound, took 2 people to assist to weight her today, cannot participate fully in interview.  Given her inability to provide history, information taken from the chart.  She was recently admitted to the hospital from 11/10/19-11/13/19 and then presented to the ER on 11/15/19. Notes from these encounters reviewed extensively today. Her admission was for sepsis due to UTI. Her ER visit notes syncope in the shower while being assisted by two employees; they were able to ease her to the floor, but she was unresponsive for 1-2 minutes. Has loss of bowels but no other sequelae noted. CT head unremarkable. She was referred to cardiology from the ER to evaluate for a cardiac cause of syncope.  Past Medical History:  Diagnosis Date  . Anemia   . Anxiety   . Cerebral infarct (HCC)   . Dementia (HCC)   . Dementia with behavioral problem (HCC)   . GERD (gastroesophageal reflux disease)   . Hyperlipidemia   . Osteoporosis   . PAF (paroxysmal atrial fibrillation) (HCC)   . Stroke (HCC)   . Thyroid disease   . Ulcerative esophagitis   . UTI (urinary tract infection)   . Vertigo     Past Surgical History:  Procedure Laterality Date  .  ABDOMINAL HYSTERECTOMY    . APPENDECTOMY    . CHOLECYSTECTOMY      Current Medications: Current Outpatient Medications on File Prior to Visit  Medication Sig  . acetaminophen (TYLENOL) 650 MG CR tablet Take 650 mg by mouth 3 (three) times daily.   Marland Kitchen ALPRAZolam (XANAX) 0.5 MG tablet Take 1 tablet (0.5 mg total) by mouth 2 (two) times daily.  Marland Kitchen alum & mag hydroxide-simeth (MAALOX/MYLANTA) 200-200-20 MG/5ML suspension Take 30 mLs by mouth every 8 (eight) hours as needed for indigestion or heartburn.   Marland Kitchen atorvastatin (LIPITOR) 10 MG tablet Take 5 mg by mouth at bedtime.   . Calcium Carbonate-Vitamin D 600-400 MG-UNIT tablet Take 1 tablet by mouth 2 (two) times daily.   Marland Kitchen levothyroxine (SYNTHROID, LEVOTHROID) 75 MCG tablet Take 75 mcg by mouth daily before breakfast.   . Lidocaine (SALONPAS PAIN RELIEVING) 4 % PTCH Apply 1 patch topically See admin instructions. Apply 1 patch to affected area(s) two times a day and remove, per schedule  . loperamide (IMODIUM) 2 MG capsule Take 2-4 mg by mouth See admin instructions. Take 2 capsules (4 mg) by mouth at first loose stool, then take 1 capsule (2 mg) every 2 hours as needed for diarrhea/loose stools  . loratadine (CLARITIN) 10 MG tablet Take 10 mg by mouth daily.  . Melatonin 10 MG TABS Take 10 mg by mouth at bedtime.   . meloxicam (MOBIC) 15  MG tablet Take 15 mg by mouth daily.  . Multiple Vitamin (MULTIVITAMIN WITH MINERALS) TABS tablet Take 1 tablet by mouth daily.   Marland Kitchen omeprazole (PRILOSEC) 20 MG capsule Take 20 mg by mouth 2 (two) times daily.  . polyethylene glycol (MIRALAX / GLYCOLAX) packet Take 17 g by mouth daily as needed for mild constipation (MIX AND DRINK).   Marland Kitchen senna (SENOKOT) 8.6 MG TABS tablet Take 1 tablet by mouth daily.  . traZODone (DESYREL) 150 MG tablet Take 75 mg by mouth at bedtime.  Marland Kitchen venlafaxine XR (EFFEXOR-XR) 75 MG 24 hr capsule Take 75 mg by mouth daily with breakfast.   No current facility-administered medications on file  prior to visit.     Allergies:   Penicillins, Bactrim [sulfamethoxazole-trimethoprim], Sulfa antibiotics, and Codeine   Social History   Tobacco Use  . Smoking status: Never Smoker  . Smokeless tobacco: Never Used  Substance Use Topics  . Alcohol use: No  . Drug use: No    Family History: family history includes Bipolar disorder in her daughter; Depression in her mother; Drug abuse in her son.  ROS:   Please see the history of present illness.  Additional pertinent ROS unable to be obtained due to patient's baseline dementia.  EKGs/Labs/Other Studies Reviewed:    The following studies were reviewed today: Echo 11/17/18 1. The left ventricle has normal systolic function with an ejection  fraction of 60-65%. The cavity size was normal. Left ventricular diastolic  Doppler parameters are consistent with impaired relaxation.  2. The right ventricle has normal systolic function. The cavity was  normal. There is no increase in right ventricular wall thickness.  3. Left atrial size was mildly dilated.  4. The mitral valve is normal in structure.  5. The tricuspid valve is normal in structure.  6. The aortic valve is tricuspid.  7. The pulmonic valve was normal in structure.  8. Right atrial pressure is estimated at 10 mmHg.   EKG:  EKG is personally reviewed.  The ekg ordered today demonstrates sinus rhythm with PACs  Recent Labs: 08/14/2019: TSH 1.432 11/15/2019: ALT 11; BUN 18; Creatinine, Ser 0.80; Hemoglobin 10.5; Platelets 324; Potassium 4.0; Sodium 141  Recent Lipid Panel No results found for: CHOL, TRIG, HDL, CHOLHDL, VLDL, LDLCALC, LDLDIRECT  Physical Exam:    VS:  BP 136/69   Pulse 75   Temp (!) 96.6 F (35.9 C)   Wt 151 lb (68.5 kg)   SpO2 96%   BMI 28.55 kg/m    Unable to do orthostatics today as patient cannot independently stand and is unsteady out of her wheelchair  Wt Readings from Last 3 Encounters:  11/27/19 151 lb (68.5 kg)  11/11/19 155 lb  6.8 oz (70.5 kg)  08/13/19 160 lb 15 oz (73 kg)    GEN: frail appearing, sitting in wheelchair, in no acute distress HEENT: Normal, moist mucous membranes NECK: No JVD CARDIAC: regular rhythm, rare early beat, normal S1 and S2, no rubs or gallops. No murmurs appreciated. VASCULAR: Radial and DP pulses 2+ bilaterally. RESPIRATORY:  Clear to auscultation without rales, wheezing or rhonchi  ABDOMEN: Soft, non-tender, non-distended MUSCULOSKELETAL:  In wheelchair, moves limbs but cannot stand/ambulate without assistance. SKIN: Warm and dry, no edema NEUROLOGIC:  Awake, hard of hearing. Responds to basic questions but cannot give any history.  ASSESSMENT:    1. Syncope, unspecified syncope type   2. PAF (paroxysmal atrial fibrillation) (HCC)   3. History of CVA (cerebrovascular accident)   4.  Dementia without behavioral disturbance, unspecified dementia type ([place])   5. Premature atrial contractions    PLAN:    Syncope: she cannot give history re: the event. Information taken from the chart -with reported bowel incontinence, concerning for neurologic etiology -will exclude cardiac etiology. Will order 30 day [employer] monitor to exclude pauses/high risk conduction disease or rapid rhythms. Will recheck echo to exclude significant structural abnormalities -will try to have monitor placed in the office (they are usually mailed) given her lack of mobility and dementia.  Paroxysmal atrial fibrillation, PACs today, with history of CVA: sinus with PACs on ECG today -CHA2DS2/VAS Stroke Risk Points-5  -based on prior notes, not on anticoagulation due to high fall risk  Baseline dementia: limits history taking ability  Plan for follow up: if monitor and echo without high risk cardiac etiology, can follow up as needed.  Buford Dresser, MD, PhD [place]  [facility]    Medication Adjustments/Labs and Tests Ordered: Current medicines are reviewed at length with the patient  today.  Concerns regarding medicines are outlined above.  Orders Placed This Encounter  Procedures  . CARDIAC EVENT MONITOR  . EKG 12-Lead  . ECHOCARDIOGRAM COMPLETE   No orders of the defined types were placed in this encounter.   Patient Instructions  Medication Instructions:  Your Physician recommend you continue on your current medication as directed.    *If you need a refill on your cardiac medications before your next appointment, please call your pharmacy*  Lab Work: None  Testing/Procedures: Your physician has requested that you have an echocardiogram. Echocardiography is a painless test that uses sound waves to create images of your heart. It provides your doctor with information about the size and shape of your heart and how well your heart's chambers and valves are working. This procedure takes approximately one hour. There are no restrictions for this procedure. [place] has recommended that you wear an event monitor. Event monitors are medical devices that record the heart's electrical activity. Doctors most often [place] these monitors to diagnose arrhythmias. Arrhythmias are problems with the speed or rhythm of the heartbeat. The monitor is a small, portable device. You can wear one while you do your normal daily activities. This is usually used to diagnose what is causing palpitations/syncope (passing out).   Follow-Up: At [facility], you and your health needs are our priority.  As part of our continuing mission to provide you with exceptional heart care, we have created designated Provider Care Teams.  These Care Teams include your primary Cardiologist (physician) and Advanced Practice Providers (APPs -  Physician Assistants and Nurse Practitioners) who all work together to provide you with the care you need, when you need it.  Your next appointment:   Based on test results  The format for your next appointment:   Either In  Person or Virtual  Provider:   Buford Dresser, MD     Signed, Buford Dresser, MD PhD 11/27/2019 1:09 PM    [place]

## 2019-11-27 NOTE — Telephone Encounter (Signed)
Monitor appointment scheduled 12/13/2019, 10:30 AM subsequent to Echocardiogram per Dr. Gwendlyn Deutscher request.

## 2019-12-13 ENCOUNTER — Ambulatory Visit (INDEPENDENT_AMBULATORY_CARE_PROVIDER_SITE_OTHER): Payer: Medicare HMO

## 2019-12-13 ENCOUNTER — Other Ambulatory Visit: Payer: Self-pay

## 2019-12-13 ENCOUNTER — Ambulatory Visit (HOSPITAL_COMMUNITY): Payer: Medicare HMO | Attending: Internal Medicine

## 2019-12-13 DIAGNOSIS — R55 Syncope and collapse: Secondary | ICD-10-CM

## 2019-12-14 ENCOUNTER — Telehealth: Payer: Self-pay

## 2019-12-14 ENCOUNTER — Telehealth: Payer: Self-pay | Admitting: Internal Medicine

## 2019-12-14 NOTE — Telephone Encounter (Signed)
Received fax from [employer]. Pt in A.fib. According to last note - pt was asymptomatic and it was day 1. Gave to DOD to review.

## 2019-12-14 NOTE — Telephone Encounter (Signed)
Cardiology Moon[place] Note  Returned page from [employer]. Patient with event monitor. Had "first documented episode of AF w RVR." Average HR 110bpm. Patient asymptomatic.   Rosario Jacks, MD Cardiology Fellow, PGY-7

## 2019-12-31 ENCOUNTER — Other Ambulatory Visit: Payer: Self-pay

## 2019-12-31 ENCOUNTER — Emergency Department (HOSPITAL_COMMUNITY)
Admission: EM | Admit: 2019-12-31 | Discharge: 2019-12-31 | Disposition: A | Discharge status: Home / Self Care | Payer: Medicare HMO | Attending: Emergency Medicine | Admitting: Emergency Medicine

## 2019-12-31 ENCOUNTER — Emergency Department (HOSPITAL_COMMUNITY): Payer: Medicare HMO

## 2019-12-31 DIAGNOSIS — Z79899 Other long term (current) drug therapy: Secondary | ICD-10-CM | POA: Insufficient documentation

## 2019-12-31 DIAGNOSIS — W050XXA Fall from non-moving wheelchair, initial encounter: Secondary | ICD-10-CM | POA: Diagnosis not present

## 2019-12-31 DIAGNOSIS — Y939 Activity, unspecified: Secondary | ICD-10-CM | POA: Insufficient documentation

## 2019-12-31 DIAGNOSIS — M542 Cervicalgia: Secondary | ICD-10-CM | POA: Insufficient documentation

## 2019-12-31 DIAGNOSIS — E039 Hypothyroidism, unspecified: Secondary | ICD-10-CM | POA: Diagnosis not present

## 2019-12-31 DIAGNOSIS — Y999 Unspecified external cause status: Secondary | ICD-10-CM | POA: Diagnosis not present

## 2019-12-31 DIAGNOSIS — S138XXA Sprain of joints and ligaments of other parts of neck, initial encounter: Secondary | ICD-10-CM | POA: Insufficient documentation

## 2019-12-31 DIAGNOSIS — I480 Paroxysmal atrial fibrillation: Secondary | ICD-10-CM | POA: Diagnosis not present

## 2019-12-31 DIAGNOSIS — W19XXXA Unspecified fall, initial encounter: Secondary | ICD-10-CM

## 2019-12-31 DIAGNOSIS — F0390 Unspecified dementia without behavioral disturbance: Secondary | ICD-10-CM | POA: Insufficient documentation

## 2019-12-31 DIAGNOSIS — Y92122 Bedroom in nursing home as the place of occurrence of the external cause: Secondary | ICD-10-CM | POA: Insufficient documentation

## 2019-12-31 DIAGNOSIS — W2209XA Striking against other stationary object, initial encounter: Secondary | ICD-10-CM | POA: Insufficient documentation

## 2019-12-31 NOTE — ED Notes (Signed)
PTAR CALLED AT 1921--Gina Hodges

## 2019-12-31 NOTE — ED Notes (Signed)
Attempted report to [place], unsuccessful
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
## 2019-12-31 NOTE — Discharge Instructions (Addendum)
Gina Hodges has a head CT today because of the fall and she was complaining of a headache. It did not show any acute abnormalities. There is no need for follow-up unless she develops new symptoms or her headache doesn't resolve in the next 2-3 days.
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
## 2020-02-18 ENCOUNTER — Emergency Department (HOSPITAL_COMMUNITY)
Admission: EM | Admit: 2020-02-18 | Discharge: 2020-02-19 | Disposition: A | Discharge status: Home / Self Care | Payer: Medicare HMO | Attending: Emergency Medicine | Admitting: Emergency Medicine

## 2020-02-18 ENCOUNTER — Encounter (HOSPITAL_COMMUNITY): Payer: Self-pay | Admitting: Emergency Medicine

## 2020-02-18 ENCOUNTER — Other Ambulatory Visit: Payer: Self-pay

## 2020-02-18 DIAGNOSIS — E039 Hypothyroidism, unspecified: Secondary | ICD-10-CM | POA: Insufficient documentation

## 2020-02-18 DIAGNOSIS — Z8673 Personal history of transient ischemic attack (TIA), and cerebral infarction without residual deficits: Secondary | ICD-10-CM | POA: Insufficient documentation

## 2020-02-18 DIAGNOSIS — R0902 Hypoxemia: Secondary | ICD-10-CM | POA: Diagnosis present

## 2020-02-18 DIAGNOSIS — F0390 Unspecified dementia without behavioral disturbance: Secondary | ICD-10-CM | POA: Diagnosis not present

## 2020-02-18 DIAGNOSIS — Z79899 Other long term (current) drug therapy: Secondary | ICD-10-CM | POA: Diagnosis not present

## 2020-02-18 NOTE — ED Notes (Signed)
PTAR called  

## 2020-02-18 NOTE — ED Notes (Signed)
Report given to [place] ?

## 2020-02-18 NOTE — ED Provider Notes (Signed)
[facility]-EMERGENCY DEPT Provider Note   CSN: [id number] Arrival date & time: 02/18/20  1501     History Chief Complaint  Patient presents with  . Cough  . Hypoxia    Gina Hodges is a 86 y.o. female.  Patient was sent over to the emergency department for hypoxia.  She stated a nursing home.  When the paramedics picked her up her oxygen level was normal and she has no complaints.  Nursing home center anyway  The history is provided by the patient and the nursing home. No language interpreter was used.  Weakness Severity:  Mild Onset quality:  Unable to specify Timing:  Constant Progression:  Unchanged Chronicity:  New Context: not alcohol use   Relieved by:  Nothing Worsened by:  Nothing Ineffective treatments:  None tried Associated symptoms: no abdominal pain, no chest pain, no cough, no diarrhea, no frequency, no headaches and no seizures        Past Medical History:  Diagnosis Date  . Anemia   . Anxiety   . Cerebral infarct (HCC)   . Dementia (HCC)   . Dementia with behavioral problem (HCC)   . GERD (gastroesophageal reflux disease)   . Hyperlipidemia   . Osteoporosis   . PAF (paroxysmal atrial fibrillation) (HCC)   . Stroke (HCC)   . Thyroid disease   . Ulcerative esophagitis   . UTI (urinary tract infection)   . Vertigo     Patient Active Problem List   Diagnosis Date Noted  . Sepsis (HCC) 11/10/2019  . AKI (acute kidney injury) (HCC) 11/10/2019  . Fall 08/13/2019  . Ingrown nail 11/12/2013  . Compression fracture of spine (HCC) 11/07/2013  . Hypothyroidism 11/07/2013  . Hyperlipidemia   . Dementia with behavioral problem (HCC)   . GERD (gastroesophageal reflux disease)   . PAF (paroxysmal atrial fibrillation) (HCC)   . Generalized anxiety disorder 04/05/2013    Past Surgical History:  Procedure Laterality Date  . ABDOMINAL HYSTERECTOMY    . APPENDECTOMY    . CHOLECYSTECTOMY       OB History   No obstetric history  on file.     Family History  Problem Relation Age of Onset  . Depression Mother   . Bipolar disorder Daughter   . Drug abuse Son     Social History   Tobacco Use  . Smoking status: Never Smoker  . Smokeless tobacco: Never Used  Substance Use Topics  . Alcohol use: No  . Drug use: No    Home Medications Prior to Admission medications   Medication Sig Start Date End Date Taking? Authorizing Provider  acetaminophen (TYLENOL) 650 MG CR tablet Take 650 mg by mouth 3 (three) times daily.     [provider]  ALPRAZolam Prudy Feeler) 0.5 MG tablet Take 1 tablet (0.5 mg total) by mouth 2 (two) times daily. 11/13/19   Ghimire, Werner Lean, MD  alum & mag hydroxide-simeth (MAALOX/MYLANTA) 200-200-20 MG/5ML suspension Take 30 mLs by mouth every 8 (eight) hours as needed for indigestion or heartburn.     [provider]  atorvastatin (LIPITOR) 10 MG tablet Take 5 mg by mouth at bedtime.  02/27/13   [provider]  Calcium Carbonate-Vitamin D 600-400 MG-UNIT tablet Take 1 tablet by mouth 2 (two) times daily.     [provider]  levothyroxine (SYNTHROID, LEVOTHROID) 75 MCG tablet Take 75 mcg by mouth daily before breakfast.  02/27/13   [provider]  Lidocaine (SALONPAS PAIN RELIEVING)  4 % PTCH Apply 1 patch topically See admin instructions. Apply 1 patch to affected area(s) two times a day and remove, per schedule    [provider]  loperamide (IMODIUM) 2 MG capsule Take 2-4 mg by mouth See admin instructions. Take 2 capsules (4 mg) by mouth at first loose stool, then take 1 capsule (2 mg) every 2 hours as needed for diarrhea/loose stools 11/08/18   [provider]  loratadine (CLARITIN) 10 MG tablet Take 10 mg by mouth daily.    [provider]  Melatonin 10 MG TABS Take 10 mg by mouth at bedtime.     [provider]  meloxicam (MOBIC) 15 MG tablet Take 15 mg by mouth daily. 10/30/18   [provider]  Multiple  Vitamin (MULTIVITAMIN WITH MINERALS) TABS tablet Take 1 tablet by mouth daily.     [provider]  omeprazole (PRILOSEC) 20 MG capsule Take 20 mg by mouth 2 (two) times daily. 10/17/18   [provider]  polyethylene glycol (MIRALAX / GLYCOLAX) packet Take 17 g by mouth daily as needed for mild constipation ([place]).     [provider]  senna (SENOKOT) 8.6 MG TABS tablet Take 1 tablet by mouth daily.    [provider]  traZODone (DESYREL) 150 MG tablet Take 75 mg by mouth at bedtime. 10/27/18   [provider]  venlafaxine XR (EFFEXOR-XR) 75 MG 24 hr capsule Take 75 mg by mouth daily with breakfast.    [provider]    Allergies    Penicillins, Bactrim [sulfamethoxazole-trimethoprim], Sulfa antibiotics, and Codeine  Review of Systems   Review of Systems  Constitutional: Negative for appetite change and fatigue.  HENT: Negative for congestion, ear discharge and sinus pressure.   Eyes: Negative for discharge.  Respiratory: Negative for cough.   Cardiovascular: Negative for chest pain.  Gastrointestinal: Negative for abdominal pain and diarrhea.  Genitourinary: Negative for frequency and hematuria.  Musculoskeletal: Negative for back pain.  Skin: Negative for rash.  Neurological: Positive for weakness. Negative for seizures and headaches.  Psychiatric/Behavioral: Negative for hallucinations.    Physical Exam Updated Vital Signs BP (!) 142/103 (BP Location: Left Arm)   Pulse 86   Temp 97.6 F (36.4 C) (Axillary)   Resp 13   SpO2 100%   Physical Exam Vitals and nursing note reviewed.  Constitutional:      Appearance: She is well-developed.  HENT:     Head: Normocephalic.     Nose: Nose normal.  Eyes:     General: No scleral icterus.    Conjunctiva/sclera: Conjunctivae normal.  Neck:     Thyroid: No thyromegaly.  Cardiovascular:     Rate and Rhythm: Normal rate and regular rhythm.     Heart sounds: No murmur.  No friction rub. No gallop.   Pulmonary:     Breath sounds: No stridor. No wheezing or rales.  Chest:     Chest wall: No tenderness.  Abdominal:     General: There is no distension.     Tenderness: There is no abdominal tenderness. There is no rebound.  Musculoskeletal:        General: Normal range of motion.     Cervical back: Neck supple.  Lymphadenopathy:     Cervical: No cervical adenopathy.  Skin:    Findings: No erythema or rash.  Neurological:     Mental Status: She is alert.     Motor: No abnormal muscle tone.  Coordination: Coordination normal.     Comments: Patient oriented to person only  Psychiatric:        Behavior: Behavior normal.     ED Results / Procedures / Treatments   Labs (all labs ordered are listed, but only abnormal results are displayed) Labs Reviewed - No data to display  EKG None  Radiology No results found.  Procedures Procedures (including critical care time)  Medications Ordered in ED Medications - No data to display  ED Course  I have reviewed the triage vital signs and the nursing notes.  Pertinent labs & imaging results that were available during my care of the patient were reviewed by me and considered in my medical decision making (see chart for details).    MDM Rules/Calculators/A&P                      Patient sent over here for hypoxia that improved when EMS came to get her.  I suspect it is just a poor reading from the pulse oximetry and the nursing home.  She will be discharged home Final Clinical Impression(s) / ED Diagnoses Final diagnoses:  Hypoxia    Rx / DC Orders ED Discharge Orders    None       Bethann Berkshire, MD 02/18/20 1621

## 2020-02-18 NOTE — Discharge Instructions (Signed)
Follow up as needed

## 2020-02-18 NOTE — ED Triage Notes (Signed)
Patient here from [facility] with complaints of hypoxia and cough. Cough x2 days. Reports O2 Sat were maintaining in 80s.

## 2020-02-19 NOTE — ED Notes (Signed)
Patient unable to sign for discharge paperwork due to dementia. Discharge paperwork given to [facility] to bring back to nursing staff at [facility]. [facility] notified of discharge.

## 2020-04-20 ENCOUNTER — Other Ambulatory Visit: Payer: Self-pay

## 2020-04-20 ENCOUNTER — Emergency Department (HOSPITAL_BASED_OUTPATIENT_CLINIC_OR_DEPARTMENT_OTHER)
Admission: EM | Admit: 2020-04-20 | Discharge: 2020-04-20 | Disposition: A | Discharge status: Skilled Nursing Facility | Payer: Medicare HMO | Attending: Emergency Medicine | Admitting: Emergency Medicine

## 2020-04-20 DIAGNOSIS — E039 Hypothyroidism, unspecified: Secondary | ICD-10-CM | POA: Diagnosis not present

## 2020-04-20 DIAGNOSIS — Z8673 Personal history of transient ischemic attack (TIA), and cerebral infarction without residual deficits: Secondary | ICD-10-CM | POA: Insufficient documentation

## 2020-04-20 DIAGNOSIS — W19XXXA Unspecified fall, initial encounter: Secondary | ICD-10-CM

## 2020-04-20 DIAGNOSIS — Z79899 Other long term (current) drug therapy: Secondary | ICD-10-CM | POA: Insufficient documentation

## 2020-04-20 DIAGNOSIS — Z79890 Hormone replacement therapy (postmenopausal): Secondary | ICD-10-CM | POA: Diagnosis not present

## 2020-04-20 DIAGNOSIS — Z043 Encounter for examination and observation following other accident: Secondary | ICD-10-CM | POA: Insufficient documentation

## 2020-04-20 DIAGNOSIS — I480 Paroxysmal atrial fibrillation: Secondary | ICD-10-CM | POA: Diagnosis not present

## 2020-04-20 DIAGNOSIS — F0391 Unspecified dementia with behavioral disturbance: Secondary | ICD-10-CM | POA: Diagnosis not present

## 2020-04-20 NOTE — ED Notes (Signed)
ED Provider at bedside. Dr. Anitra Lauth examining patient

## 2020-04-20 NOTE — ED Triage Notes (Signed)
Pt brought in by EMS from [facility]. Per staff pt attempted to stand from her wheelchair and walk, she fell but did not hit her head. Pt is confused at baseline. Alert upon arrival. Pt in room 12 in view of nurses station with safety alarm active on stretcher

## 2020-04-20 NOTE — Discharge Instructions (Signed)
No notable injury seen on exam and pt has not reproducible pain.  No x-rays needed at this time.

## 2020-04-20 NOTE — ED Provider Notes (Signed)
[facility] EMERGENCY DEPARTMENT Provider Note   CSN: [id number] Arrival date & time: 04/20/20  1842     History Chief Complaint  Patient presents with  . Fall    Gina Hodges is a 86 y.o. female.  Patient is a 86 year old female with a history of dementia who lives at memory care and is wheelchair-bound, PAF without anticoagulation, stroke, recurrent UTIs and prior compression fractures of the spine who is presenting today from her living facility after she was witnessed to fall from the wheelchair.  Staff reports that patient stood up forgetting that she was unable to walk and fell to the floor.  They denied that she had any head injury and she did not lose consciousness.  Patient initially complained of back pain but had no other complaints per EMS.  Staff reported that patient is at her baseline and has otherwise been acting at her baseline.  The history is provided by the patient. The history is limited by the absence of a caregiver.  Fall This is a new problem. The current episode started 1 to 2 hours ago. The problem occurs constantly. The problem has not changed since onset.Associated symptoms comments: No complaints..       Past Medical History:  Diagnosis Date  . Anemia   . Anxiety   . Cerebral infarct (HCC)   . Dementia (HCC)   . Dementia with behavioral problem (HCC)   . GERD (gastroesophageal reflux disease)   . Hyperlipidemia   . Osteoporosis   . PAF (paroxysmal atrial fibrillation) (HCC)   . Stroke (HCC)   . Thyroid disease   . Ulcerative esophagitis   . UTI (urinary tract infection)   . Vertigo     Patient Active Problem List   Diagnosis Date Noted  . Sepsis (HCC) 11/10/2019  . AKI (acute kidney injury) (HCC) 11/10/2019  . Fall 08/13/2019  . Ingrown nail 11/12/2013  . Compression fracture of spine (HCC) 11/07/2013  . Hypothyroidism 11/07/2013  . Hyperlipidemia   . Dementia with behavioral problem (HCC)   . GERD (gastroesophageal reflux  disease)   . PAF (paroxysmal atrial fibrillation) (HCC)   . Generalized anxiety disorder 04/05/2013    Past Surgical History:  Procedure Laterality Date  . ABDOMINAL HYSTERECTOMY    . APPENDECTOMY    . CHOLECYSTECTOMY       OB History   No obstetric history on file.     Family History  Problem Relation Age of Onset  . Depression Mother   . Bipolar disorder Daughter   . Drug abuse Son     Social History   Tobacco Use  . Smoking status: Never Smoker  . Smokeless tobacco: Never Used  Substance Use Topics  . Alcohol use: No  . Drug use: No    Home Medications Prior to Admission medications   Medication Sig Start Date End Date Taking? Authorizing Provider  acetaminophen (TYLENOL) 650 MG CR tablet Take 650 mg by mouth 3 (three) times daily.     [provider]  ALPRAZolam Prudy Feeler) 0.5 MG tablet Take 1 tablet (0.5 mg total) by mouth 2 (two) times daily. 11/13/19   Ghimire, Werner Lean, MD  alum & mag hydroxide-simeth (MAALOX/MYLANTA) 200-200-20 MG/5ML suspension Take 30 mLs by mouth every 8 (eight) hours as needed for indigestion or heartburn.     [provider]  atorvastatin (LIPITOR) 10 MG tablet Take 5 mg by mouth at bedtime.  02/27/13   [provider]  Calcium Carbonate-Vitamin D 600-400  MG-UNIT tablet Take 1 tablet by mouth 2 (two) times daily.     [provider]  levothyroxine (SYNTHROID, LEVOTHROID) 75 MCG tablet Take 75 mcg by mouth daily before breakfast.  02/27/13   [provider]  Lidocaine (SALONPAS PAIN RELIEVING) 4 % PTCH Apply 1 patch topically See admin instructions. Apply 1 patch to affected area(s) two times a day and remove, per schedule    [provider]  loperamide (IMODIUM) 2 MG capsule Take 2-4 mg by mouth See admin instructions. Take 2 capsules (4 mg) by mouth at first loose stool, then take 1 capsule (2 mg) every 2 hours as needed for diarrhea/loose stools 11/08/18   [provider]  loratadine  (CLARITIN) 10 MG tablet Take 10 mg by mouth daily.    [provider]  Melatonin 10 MG TABS Take 10 mg by mouth at bedtime.     [provider]  meloxicam (MOBIC) 15 MG tablet Take 15 mg by mouth daily. 10/30/18   [provider]  Multiple Vitamin (MULTIVITAMIN WITH MINERALS) TABS tablet Take 1 tablet by mouth daily.     [provider]  omeprazole (PRILOSEC) 20 MG capsule Take 20 mg by mouth 2 (two) times daily. 10/17/18   [provider]  polyethylene glycol (MIRALAX / GLYCOLAX) packet Take 17 g by mouth daily as needed for mild constipation (MIX AND DRINK).     [provider]  senna (SENOKOT) 8.6 MG TABS tablet Take 1 tablet by mouth daily.    [provider]  traZODone (DESYREL) 150 MG tablet Take 75 mg by mouth at bedtime. 10/27/18   [provider]  venlafaxine XR (EFFEXOR-XR) 75 MG 24 hr capsule Take 75 mg by mouth daily with breakfast.    [provider]    Allergies    Penicillins, Bactrim [sulfamethoxazole-trimethoprim], Sulfa antibiotics, and Codeine  Review of Systems   Review of Systems  Unable to perform ROS: Dementia    Physical Exam Updated Vital Signs BP (!) 124/105 (BP Location: Right Arm)   Pulse (!) 107   Temp 97.9 F (36.6 C) (Axillary)   Resp 16   SpO2 98%   Physical Exam Vitals and nursing note reviewed.  Constitutional:      General: She is not in acute distress.    Appearance: Normal appearance. She is well-developed and normal weight.  HENT:     Head: Normocephalic and atraumatic.     Comments: No contusions, abrasions or lacerations to the head.  No tenderness with palpation of the scalp.    Right Ear: Tympanic membrane normal.     Left Ear: Tympanic membrane normal.  Eyes:     Pupils: Pupils are equal, round, and reactive to light.     Funduscopic exam:    Right eye: No papilledema.        Left eye: No papilledema.  Cardiovascular:     Rate and Rhythm: Normal rate and  regular rhythm.     Heart sounds: Normal heart sounds. No murmur heard.  No friction rub.  Pulmonary:     Effort: Pulmonary effort is normal.     Breath sounds: Normal breath sounds. No wheezing or rales.  Abdominal:     General: Bowel sounds are normal. There is no distension.     Palpations: Abdomen is soft.     Tenderness: There is no abdominal tenderness. There is no guarding or rebound.  Musculoskeletal:        General: No tenderness.  Normal range of motion.     Cervical back: Normal range of motion and neck supple. No tenderness. No spinous process tenderness or muscular tenderness.     Comments: No edema.  Patient was able to sit forward on her own without any tenderness to palpation to the thoracic or lumbar spine.  No muscular back tenderness.  Full range of motion of bilateral shoulders, elbows, wrists, hips, knees and ankles without tenderness.  Lymphadenopathy:     Cervical: No cervical adenopathy.  Skin:    General: Skin is warm and dry.     Findings: No rash.  Neurological:     Mental Status: She is alert.     Cranial Nerves: No cranial nerve deficit.     Sensory: No sensory deficit.     Gait: Gait normal.     Comments: Oriented to self and able to follow commands.  Psychiatric:     Comments: Calm and cooperative     ED Results / Procedures / Treatments   Labs (all labs ordered are listed, but only abnormal results are displayed) Labs Reviewed - No data to display  EKG None  Radiology No results found.  Procedures Procedures (including critical care time)  Medications Ordered in ED Medications - No data to display  ED Course  I have reviewed the triage vital signs and the nursing notes.  Pertinent labs & imaging results that were available during my care of the patient were reviewed by me and considered in my medical decision making (see chart for details).    MDM Rules/Calculators/A&P                          Elderly female who lives in a memory  care facility who fell from her wheelchair today.  Fall was witnessed by staff but they were unable to get to her in time.  They report patient forgot she was unable to walk and stood up and fell to the floor.  Patient did not hit her head or lose consciousness.  She has no evidence of trauma to her head and has no complaints at this time.  Full range of motion of all joints produce no pain.  Patient is able to sit up on her own without any discomfort in her back and no reproducible pain with palpation.  Patient is not on anticoagulation.  At this time patient does not appear to need any imaging and otherwise appears stable for discharge back to her facility.  Final Clinical Impression(s) / ED Diagnoses Final diagnoses:  Fall, initial encounter    Rx / DC Orders ED Discharge Orders    None       Gwyneth Sprout, MD 04/20/20 1919

## 2020-04-20 NOTE — ED Notes (Signed)
Contacted [facility] - patient ready for transport

## 2020-04-20 NOTE — ED Notes (Addendum)
Left with [facility] at this time.   Yellow DNR form was sent back to facility via transporter.

## 2020-07-15 ENCOUNTER — Emergency Department (HOSPITAL_COMMUNITY)
Admission: EM | Admit: 2020-07-15 | Discharge: 2020-07-15 | Disposition: A | Discharge status: Home / Self Care | Payer: Medicare HMO | Attending: Emergency Medicine | Admitting: Emergency Medicine

## 2020-07-15 ENCOUNTER — Encounter (HOSPITAL_COMMUNITY): Payer: Self-pay

## 2020-07-15 ENCOUNTER — Emergency Department (HOSPITAL_COMMUNITY): Payer: Medicare HMO

## 2020-07-15 DIAGNOSIS — F0391 Unspecified dementia with behavioral disturbance: Secondary | ICD-10-CM | POA: Diagnosis not present

## 2020-07-15 DIAGNOSIS — R062 Wheezing: Secondary | ICD-10-CM | POA: Diagnosis not present

## 2020-07-15 DIAGNOSIS — Z79899 Other long term (current) drug therapy: Secondary | ICD-10-CM | POA: Insufficient documentation

## 2020-07-15 DIAGNOSIS — R0602 Shortness of breath: Secondary | ICD-10-CM | POA: Diagnosis present

## 2020-07-15 DIAGNOSIS — E039 Hypothyroidism, unspecified: Secondary | ICD-10-CM | POA: Insufficient documentation

## 2020-07-15 LAB — BASIC METABOLIC PANEL
Anion gap: 8 (ref 5–15)
BUN: 17 mg/dL (ref 8–23)
CO2: 22 mmol/L (ref 22–32)
Calcium: 9 mg/dL (ref 8.9–10.3)
Chloride: 105 mmol/L (ref 98–111)
Creatinine, Ser: 0.9 mg/dL (ref 0.44–1.00)
GFR, Estimated: 56 mL/min — ABNORMAL LOW (ref >60)
Glucose, Bld: 104 mg/dL — ABNORMAL HIGH (ref 70–99)
Potassium: 4.4 mmol/L (ref 3.5–5.1)
Sodium: 135 mmol/L (ref 135–145)

## 2020-07-15 LAB — CBC
HCT: 37.4 % (ref 36.0–46.0)
Hemoglobin: 11.2 g/dL — ABNORMAL LOW (ref 12.0–15.0)
MCH: 28.6 pg (ref 26.0–34.0)
MCHC: 29.9 g/dL — ABNORMAL LOW (ref 30.0–36.0)
MCV: 95.4 fL (ref 80.0–100.0)
Platelets: 275 10*3/uL (ref 150–400)
RBC: 3.92 MIL/uL (ref 3.87–5.11)
RDW: 15 % (ref 11.5–15.5)
WBC: 7.6 10*3/uL (ref 4.0–10.5)
nRBC: 0 % (ref 0.0–0.2)

## 2020-07-15 LAB — TROPONIN I (HIGH SENSITIVITY): Troponin I (High Sensitivity): 5 ng/L (ref <18)

## 2020-07-15 LAB — BRAIN NATRIURETIC PEPTIDE: B Natriuretic Peptide: 141.6 pg/mL — ABNORMAL HIGH (ref 0.0–100.0)

## 2020-07-15 NOTE — Discharge Instructions (Addendum)
Ms. Bartl appeared well without any complaints while in the ED tonight. We evaluated her for shortness of breath.  Her chest x-ray did not demonstrate any pneumonia.  She had some findings which could indicate mild fluid overload in her lungs and the blood test for heart failure which was slightly high.  However, this does not seem to be severe enough to cause symptoms described earlier today.  She should follow-up with her doctor regarding these findings.  Otherwise, evaluation of the heart with EKG and blood testing was reassuring.  Blood cell counts and metabolic panel were normal.  As she is doing well, with normal vital signs, normal oxygen, we feel she is safe to return to [facility].   If she develops worsening shortness of breath, chest pain, fever, she should return to the emergency department.

## 2020-07-15 NOTE — ED Triage Notes (Signed)
To triage via EMS. Per EMS called for wheezing, shortness of breath, and face turning blue.  Upon EMS arrival pt not wheezing, no c/o shortness of breath, and face not blue.   Pt denies shortness of breath.

## 2020-07-15 NOTE — ED Provider Notes (Signed)
[facility] EMERGENCY DEPARTMENT Provider Note   CSN: [id number] Arrival date & time: 07/15/20  1438     History Chief Complaint  Patient presents with  . Shortness of Breath    Gina Hodges is a 86 y.o. female.  Patient with history of previous stroke, dementia, paroxysmal atrial fibrillation no current anticoagulation --presents from nursing facility, [facility], for evaluation of shortness of breath.  Level 5 caveat due to dementia.  Per EMS report, patient had an episode of wheezing and shortness of breath earlier with a blue color of her skin.        Past Medical History:  Diagnosis Date  . Anemia   . Anxiety   . Cerebral infarct (HCC)   . Dementia (HCC)   . Dementia with behavioral problem (HCC)   . GERD (gastroesophageal reflux disease)   . Hyperlipidemia   . Osteoporosis   . PAF (paroxysmal atrial fibrillation) (HCC)   . Stroke (HCC)   . Thyroid disease   . Ulcerative esophagitis   . UTI (urinary tract infection)   . Vertigo     Patient Active Problem List   Diagnosis Date Noted  . Sepsis (HCC) 11/10/2019  . AKI (acute kidney injury) (HCC) 11/10/2019  . Fall 08/13/2019  . Ingrown nail 11/12/2013  . Compression fracture of spine (HCC) 11/07/2013  . Hypothyroidism 11/07/2013  . Hyperlipidemia   . Dementia with behavioral problem (HCC)   . GERD (gastroesophageal reflux disease)   . PAF (paroxysmal atrial fibrillation) (HCC)   . Generalized anxiety disorder 04/05/2013    Past Surgical History:  Procedure Laterality Date  . ABDOMINAL HYSTERECTOMY    . APPENDECTOMY    . CHOLECYSTECTOMY       OB History   No obstetric history on file.     Family History  Problem Relation Age of Onset  . Depression Mother   . Bipolar disorder Daughter   . Drug abuse Son     Social History   Tobacco Use  . Smoking status: Never Smoker  . Smokeless tobacco: Never Used  Substance Use Topics  . Alcohol use: No  . Drug use: No     Home Medications Prior to Admission medications   Medication Sig Start Date End Date Taking? Authorizing Provider  acetaminophen (TYLENOL) 650 MG CR tablet Take 650 mg by mouth 3 (three) times daily.     [provider]  ALPRAZolam Prudy Feeler) 0.5 MG tablet Take 1 tablet (0.5 mg total) by mouth 2 (two) times daily. 11/13/19   Ghimire, Werner Lean, MD  alum & mag hydroxide-simeth (MAALOX/MYLANTA) 200-200-20 MG/5ML suspension Take 30 mLs by mouth every 8 (eight) hours as needed for indigestion or heartburn.     [provider]  atorvastatin (LIPITOR) 10 MG tablet Take 5 mg by mouth at bedtime.  02/27/13   [provider]  Calcium Carbonate-Vitamin D 600-400 MG-UNIT tablet Take 1 tablet by mouth 2 (two) times daily.     [provider]  levothyroxine (SYNTHROID, LEVOTHROID) 75 MCG tablet Take 75 mcg by mouth daily before breakfast.  02/27/13   [provider]  Lidocaine (SALONPAS PAIN RELIEVING) 4 % PTCH Apply 1 patch topically See admin instructions. Apply 1 patch to affected area(s) two times a day and remove, per schedule    [provider]  loperamide (IMODIUM) 2 MG capsule Take 2-4 mg by mouth See admin instructions. Take 2 capsules (4 mg) by mouth at first loose stool, then take 1 capsule (  2 mg) every 2 hours as needed for diarrhea/loose stools 11/08/18   [provider]  loratadine (CLARITIN) 10 MG tablet Take 10 mg by mouth daily.    [provider]  Melatonin 10 MG TABS Take 10 mg by mouth at bedtime.     [provider]  meloxicam (MOBIC) 15 MG tablet Take 15 mg by mouth daily. 10/30/18   [provider]  Multiple Vitamin (MULTIVITAMIN WITH MINERALS) TABS tablet Take 1 tablet by mouth daily.     [provider]  omeprazole (PRILOSEC) 20 MG capsule Take 20 mg by mouth 2 (two) times daily. 10/17/18   [provider]  polyethylene glycol (MIRALAX / GLYCOLAX) packet Take 17 g by mouth daily as  needed for mild constipation (MIX AND DRINK).     [provider]  senna (SENOKOT) 8.6 MG TABS tablet Take 1 tablet by mouth daily.    [provider]  traZODone (DESYREL) 150 MG tablet Take 75 mg by mouth at bedtime. 10/27/18   [provider]  venlafaxine XR (EFFEXOR-XR) 75 MG 24 hr capsule Take 75 mg by mouth daily with breakfast.    [provider]    Allergies    Penicillins, Bactrim [sulfamethoxazole-trimethoprim], Sulfa antibiotics, and Codeine  Review of Systems   Review of Systems  Unable to perform ROS: Dementia    Physical Exam Updated Vital Signs BP 115/60 (BP Location: Right Arm)   Pulse 78   Temp 98.7 F (37.1 C) (Oral)   SpO2 100%   Physical Exam Vitals and nursing note reviewed.  Constitutional:      Appearance: She is well-developed. She is not diaphoretic.  HENT:     Head: Normocephalic and atraumatic.     Mouth/Throat:     Mouth: Mucous membranes are not dry.  Eyes:     Conjunctiva/sclera: Conjunctivae normal.  Neck:     Vascular: Normal carotid pulses. JVD present. No carotid bruit.     Trachea: Trachea normal. No tracheal deviation.  Cardiovascular:     Rate and Rhythm: Normal rate and regular rhythm.     Pulses: No decreased pulses.     Heart sounds: Normal heart sounds, S1 normal and S2 normal. No murmur heard.   Pulmonary:     Effort: Pulmonary effort is normal. No respiratory distress.     Breath sounds: No wheezing.  Chest:     Chest wall: No tenderness.  Abdominal:     General: Bowel sounds are normal.     Palpations: Abdomen is soft.     Tenderness: There is no abdominal tenderness. There is no guarding or rebound.  Musculoskeletal:        General: Normal range of motion.     Cervical back: Normal range of motion and neck supple. No muscular tenderness.     Right lower leg: No tenderness. No edema.     Left lower leg: No tenderness. No edema.  Skin:    General: Skin is warm and dry.     Coloration:  Skin is not pale.  Neurological:     Mental Status: She is alert.     ED Results / Procedures / Treatments   Labs (all labs ordered are listed, but only abnormal results are displayed) Labs Reviewed  CBC - Abnormal; Notable for the following components:      Result Value   Hemoglobin 11.2 (*)    MCHC 29.9 (*)    All other components within normal limits  BASIC  METABOLIC PANEL - Abnormal; Notable for the following components:   Glucose, Bld 104 (*)    GFR, Estimated 56 (*)    All other components within normal limits  BRAIN NATRIURETIC PEPTIDE - Abnormal; Notable for the following components:   B Natriuretic Peptide 141.6 (*)    All other components within normal limits  TROPONIN I (HIGH SENSITIVITY)    ED ECG REPORT   Date: 07/15/2020  Rate: 86  Rhythm:   QRS Axis: left  Intervals: normal  ST/T Wave abnormalities: normal  Conduction Disutrbances:none  Narrative Interpretation:   Old EKG Reviewed:NSR with PACs in 11/27/19  I have personally reviewed the EKG tracing and agree with the computerized printout as noted.  Radiology DG Chest 2 View  Result Date: 07/15/2020 CLINICAL DATA:  Shortness of breath EXAM: CHEST - 2 VIEW COMPARISON:  Radiograph 11/10/2019, CT 11/17/2018 FINDINGS: Chronically coarsened reticular interstitial change with more hazy interstitial opacity increased from prior with slight fissural thickening in central vascular congestion. Stable cardiomediastinal contours with a calcified aorta. Redemonstration of a moderate to large air and fluid-filled hiatal hernia. No pneumothorax. No visible effusion. The osseous structures appear diffusely demineralized which may limit detection of small or nondisplaced fractures. No acute osseous or soft tissue abnormality. Degenerative changes are present in the imaged spine and shoulders. Dextrocurvature of the spine similar to prior. IMPRESSION: 1. Findings could suggest mild CHF/volume overload with interstitial edema.  2. Stable moderate to large hiatal hernia. Electronically Signed   By: Kreg Shropshire M.D.   On: 07/15/2020 15:32    Procedures Procedures (including critical care time)  Medications Ordered in ED Medications - No data to display  ED Course  I have reviewed the triage vital signs and the nursing notes.  Pertinent labs & imaging results that were available during my care of the patient were reviewed by me and considered in my medical decision making (see chart for details).  Patient seen and examined.  Patient unable to give history.  I tried calling facility and no one answered.  Will call back.  Work-up initiated.  Patient currently in no distress.  Exam is reassuring.  Vital signs reviewed and are as follows: BP 115/60 (BP Location: Right Arm)   Pulse 78   Temp 98.7 F (37.1 C) (Oral)   SpO2 100%   6:11 PM Pt discussed with and seen by Dr. Anitra Lauth. I have called facility again and left VM without callback. Pt continues to do well. Appears stable, no complaints.   Plan: recheck vitals -- if they continue to look okay, plan d/c back to facility.   7:08 PM Pt continues to do well with reassuring vital signs. Plan for d/c back to [facility].     MDM Rules/Calculators/A&P                          Patient presents after an episode of shortness of breath and possible cyanosis tonight.  In the emergency department, her vital signs are normal and she looks well.  She is not hypoxic or tachycardic.  She had a chest x-ray and BNP which could indicate mild heart failure, however her symptoms do not seem to be persistent.  She otherwise does not appear fluid overloaded on exam.  Troponin is negative.  CBC and BMP are reassuring.  EKG unchanged, nonischemic.  No indications for admission tonight.  She is stable for discharge back to facility.   Final Clinical Impression(s) / ED Diagnoses  Final diagnoses:  Shortness of breath    Rx / DC Orders ED Discharge Orders    None       Renne CriglerGeiple,  Regine Christian, Cordelia Poche-C 07/15/20 1910    Gwyneth SproutPlunkett, Whitney, MD 07/16/20 1349

## 2020-07-15 NOTE — ED Notes (Signed)
No answer x3

## 2020-11-14 ENCOUNTER — Emergency Department (HOSPITAL_COMMUNITY): Payer: Medicare HMO

## 2020-11-14 ENCOUNTER — Other Ambulatory Visit: Payer: Self-pay

## 2020-11-14 ENCOUNTER — Emergency Department (HOSPITAL_COMMUNITY)
Admission: EM | Admit: 2020-11-14 | Discharge: 2020-11-14 | Disposition: A | Discharge status: Home / Self Care | Payer: Medicare HMO | Attending: Emergency Medicine | Admitting: Emergency Medicine

## 2020-11-14 DIAGNOSIS — S0181XA Laceration without foreign body of other part of head, initial encounter: Secondary | ICD-10-CM

## 2020-11-14 DIAGNOSIS — Z8673 Personal history of transient ischemic attack (TIA), and cerebral infarction without residual deficits: Secondary | ICD-10-CM | POA: Insufficient documentation

## 2020-11-14 DIAGNOSIS — W19XXXA Unspecified fall, initial encounter: Secondary | ICD-10-CM | POA: Diagnosis not present

## 2020-11-14 DIAGNOSIS — E039 Hypothyroidism, unspecified: Secondary | ICD-10-CM | POA: Insufficient documentation

## 2020-11-14 DIAGNOSIS — S80211A Abrasion, right knee, initial encounter: Secondary | ICD-10-CM | POA: Insufficient documentation

## 2020-11-14 DIAGNOSIS — F0390 Unspecified dementia without behavioral disturbance: Secondary | ICD-10-CM | POA: Insufficient documentation

## 2020-11-14 DIAGNOSIS — Z79899 Other long term (current) drug therapy: Secondary | ICD-10-CM | POA: Insufficient documentation

## 2020-11-14 DIAGNOSIS — Y92129 Unspecified place in nursing home as the place of occurrence of the external cause: Secondary | ICD-10-CM | POA: Insufficient documentation

## 2020-11-14 DIAGNOSIS — S0990XA Unspecified injury of head, initial encounter: Secondary | ICD-10-CM | POA: Diagnosis present

## 2020-11-14 LAB — BASIC METABOLIC PANEL
Anion gap: 11 (ref 5–15)
BUN: 23 mg/dL (ref 8–23)
CO2: 22 mmol/L (ref 22–32)
Calcium: 9.1 mg/dL (ref 8.9–10.3)
Chloride: 105 mmol/L (ref 98–111)
Creatinine, Ser: 0.99 mg/dL (ref 0.44–1.00)
GFR, Estimated: 54 mL/min — ABNORMAL LOW (ref >60)
Glucose, Bld: 95 mg/dL (ref 70–99)
Potassium: 4.4 mmol/L (ref 3.5–5.1)
Sodium: 138 mmol/L (ref 135–145)

## 2020-11-14 LAB — CBC
HCT: 41.2 % (ref 36.0–46.0)
Hemoglobin: 12 g/dL (ref 12.0–15.0)
MCH: 28.8 pg (ref 26.0–34.0)
MCHC: 29.1 g/dL — ABNORMAL LOW (ref 30.0–36.0)
MCV: 98.8 fL (ref 80.0–100.0)
Platelets: 302 10*3/uL (ref 150–400)
RBC: 4.17 MIL/uL (ref 3.87–5.11)
RDW: 15.6 % — ABNORMAL HIGH (ref 11.5–15.5)
WBC: 13.2 10*3/uL — ABNORMAL HIGH (ref 4.0–10.5)
nRBC: 0 % (ref 0.0–0.2)

## 2020-11-14 LAB — URINALYSIS, ROUTINE W REFLEX MICROSCOPIC
Bilirubin Urine: NEGATIVE
Glucose, UA: NEGATIVE mg/dL
Hgb urine dipstick: NEGATIVE
Ketones, ur: NEGATIVE mg/dL
Nitrite: NEGATIVE
Protein, ur: NEGATIVE mg/dL
Specific Gravity, Urine: 1.019 (ref 1.005–1.030)
pH: 5 (ref 5.0–8.0)

## 2020-11-14 MED ORDER — LIDOCAINE-EPINEPHRINE 1 %-1:100000 IJ SOLN
10.0000 mL | Freq: Once | INTRAMUSCULAR | Status: AC
  Administered 2020-11-14: 10 mL
  Filled 2020-11-14: qty 1

## 2020-11-14 NOTE — Discharge Instructions (Addendum)
Sutures may be removed in one week Remainder of work up without evidence of injury

## 2020-11-14 NOTE — ED Provider Notes (Signed)
LACERATION REPAIR Performed by: Langston Masker Authorized by: Langston Masker Consent: Verbal consent obtained. Risks and benefits: risks, benefits and alternatives were discussed Consent given by: patient Patient identity confirmed: provided demographic data Prepped and Draped in normal sterile fashion Wound explored  Laceration Location: left forehead  Laceration Length: 1cm  No Foreign Bodies seen or palpated  Anesthesia: local infiltration  Local anesthetic: lidocaine 1% epinephrine  Anesthetic total: 5 ml  Irrigation method: syringe Amount of cleaning: standard  Skin closure: sutures   Number of sutures: 3  Technique: simple  Patient tolerance: Patient tolerated the procedure well with no immediate complications.    Elson Areas, [place] 11/14/20 1003    Margarita Grizzle, MD 11/14/20 [phone]

## 2020-11-14 NOTE — ED Triage Notes (Signed)
Pt BIB [facility] from [facility] after having an unwitnessed fall. Pt found laying down on floor beside bed lying on back. Unknown if there was any LOC. No report of a blood thinner. Pt does have a laceration to the left side of her forehead and some carpet burn to her chin area. VSS. NAD at this time.

## 2020-11-14 NOTE — Medical Student Note (Signed)
[facility]-EMERGENCY DEPT Provider Student Note For educational purposes for Medical, PA and NP students only and not part of the legal medical record.   CSN: [id number] Arrival date & time: 11/14/20  1443      History   Chief Complaint Chief Complaint  Patient presents with  . Fall    HPI Gina Hodges is a 87 y.o. female with PMH of dementia, CVA, paroxysmal a fib not anticoagulated who presents to ED via EMS from [facility] facility for unwitnessed mechanical fall.   Per EMS, patient found laying down on back next to bed on morning rounds. Unknown LOC. Patient is poor historian. Information obtained from EMS, chart review and patient interview. Patient states that she rolled out of bed last night. She denies trying to get up to use the restroom. She states she landed on her stomach. Denies neck or back pain. Denies pain to extremities or hips. Denies recent fevers, abdominal pain or dysuria. Denies hitting her head despite laceration to left forehead and abrasion to chin. She states she is unable to walk and does not use a cane or walker at facility.   On chart review she has had two previous visits in 2021 for fall. In July, she had witnessed fall from wheelchair. States she forgot she could not ambulate, stood up and fell. Had negative work-up at that time and d/c back to facility.   Past Medical History:  Diagnosis Date  . Anemia   . Anxiety   . Cerebral infarct (HCC)   . Dementia (HCC)   . Dementia with behavioral problem (HCC)   . GERD (gastroesophageal reflux disease)   . Hyperlipidemia   . Osteoporosis   . PAF (paroxysmal atrial fibrillation) (HCC)   . Stroke (HCC)   . Thyroid disease   . Ulcerative esophagitis   . UTI (urinary tract infection)   . Vertigo     Patient Active Problem List   Diagnosis Date Noted  . Sepsis (HCC) 11/10/2019  . AKI (acute kidney injury) (HCC) 11/10/2019  . Fall 08/13/2019  . Ingrown nail 11/12/2013  . Compression fracture of spine (HCC)  11/07/2013  . Hypothyroidism 11/07/2013  . Hyperlipidemia   . Dementia with behavioral problem (HCC)   . GERD (gastroesophageal reflux disease)   . PAF (paroxysmal atrial fibrillation) (HCC)   . Generalized anxiety disorder 04/05/2013    Past Surgical History:  Procedure Laterality Date  . ABDOMINAL HYSTERECTOMY    . APPENDECTOMY    . CHOLECYSTECTOMY      OB History   No obstetric history on file.     Home Medications    Prior to Admission medications   Medication Sig Start Date End Date Taking? Authorizing Provider  acetaminophen (TYLENOL) 650 MG CR tablet Take 650 mg by mouth 3 (three) times daily.     [provider]  ALPRAZolam Prudy Feeler) 0.5 MG tablet Take 1 tablet (0.5 mg total) by mouth 2 (two) times daily. 11/13/19   Ghimire, Werner Lean, MD  alum & mag hydroxide-simeth (MAALOX/MYLANTA) 200-200-20 MG/5ML suspension Take 30 mLs by mouth every 8 (eight) hours as needed for indigestion or heartburn.     [provider]  atorvastatin (LIPITOR) 10 MG tablet Take 5 mg by mouth at bedtime.  02/27/13   [provider]  Calcium Carbonate-Vitamin D 600-400 MG-UNIT tablet Take 1 tablet by mouth 2 (two) times daily.     [provider]  levothyroxine (SYNTHROID, LEVOTHROID) 75 MCG tablet Take 75 mcg by mouth daily  before breakfast.  02/27/13   [provider]  Lidocaine (SALONPAS PAIN RELIEVING) 4 % PTCH Apply 1 patch topically See admin instructions. Apply 1 patch to affected area(s) two times a day and remove, per schedule    [provider]  loperamide (IMODIUM) 2 MG capsule Take 2-4 mg by mouth See admin instructions. Take 2 capsules (4 mg) by mouth at first loose stool, then take 1 capsule (2 mg) every 2 hours as needed for diarrhea/loose stools 11/08/18   [provider]  loratadine (CLARITIN) 10 MG tablet Take 10 mg by mouth daily.    [provider]  Melatonin 10 MG TABS Take 10 mg by mouth at bedtime.     [provider]  meloxicam (MOBIC) 15 MG tablet Take 15 mg by mouth daily. 10/30/18   [provider]  Multiple Vitamin (MULTIVITAMIN WITH MINERALS) TABS tablet Take 1 tablet by mouth daily.     [provider]  omeprazole (PRILOSEC) 20 MG capsule Take 20 mg by mouth 2 (two) times daily. 10/17/18   [provider]  polyethylene glycol (MIRALAX / GLYCOLAX) packet Take 17 g by mouth daily as needed for mild constipation (MIX AND DRINK).     [provider]  senna (SENOKOT) 8.6 MG TABS tablet Take 1 tablet by mouth daily.    [provider]  traZODone (DESYREL) 150 MG tablet Take 75 mg by mouth at bedtime. 10/27/18   [provider]  venlafaxine XR (EFFEXOR-XR) 75 MG 24 hr capsule Take 75 mg by mouth daily with breakfast.    [provider]    Family History Family History  Problem Relation Age of Onset  . Depression Mother   . Bipolar disorder Daughter   . Drug abuse Son     Social History Social History   Tobacco Use  . Smoking status: Never Smoker  . Smokeless tobacco: Never Used  Substance Use Topics  . Alcohol use: No  . Drug use: No     Allergies   Penicillins, Bactrim [sulfamethoxazole-trimethoprim], Sulfa antibiotics, and Codeine   Review of Systems Review of Systems  Constitutional: Negative for fever.  HENT: Negative.   Eyes: Negative.   Respiratory: Negative.   Cardiovascular: Negative.   Gastrointestinal: Negative.   Endocrine: Negative.   Genitourinary: Negative.   Musculoskeletal: Positive for gait problem.  Skin: Negative.   Allergic/Immunologic: Negative.   Neurological: Negative for dizziness, syncope and light-headedness.  Hematological: Negative.   Psychiatric/Behavioral: Positive for confusion.   Physical Exam Updated Vital Signs BP (!) 150/87   Pulse 83   Temp 98 F (36.7 C) (Oral)   Resp 20   Ht 5\' 1"  (1.549 m)   Wt 68.5 kg   SpO2 95%   BMI 28.53 kg/m   Physical  Exam Constitutional:      General: She is not in acute distress. HENT:     Head: Normocephalic.     Comments: Laceration to left forehead, abrasion to chin    Nose: Nose normal.  Eyes:     Extraocular Movements: Extraocular movements intact.     Comments: Pupils pinpoint   Neck:     Comments: In c-collar  Cardiovascular:     Rate and Rhythm: Normal rate. Rhythm irregularly irregular.     Pulses: Normal pulses.     Heart sounds: Normal heart sounds. No murmur heard.   Pulmonary:     Effort: Pulmonary effort is normal.  Abdominal:     General: There  is no distension.     Palpations: Abdomen is soft.     Tenderness: There is no abdominal tenderness.  Musculoskeletal:        General: No swelling or deformity.  Skin:    General: Skin is warm and dry.     Capillary Refill: Capillary refill takes less than 2 seconds.     Findings: Abrasion and laceration present.       Neurological:     Mental Status: She is alert. Mental status is at baseline. She is disoriented.  Psychiatric:        Mood and Affect: Mood normal.    ED Treatments / Results  Labs (all labs ordered are listed, but only abnormal results are displayed) Labs Reviewed  CBC - Abnormal; Notable for the following components:      Result Value   WBC 13.2 (*)    MCHC 29.1 (*)    RDW 15.6 (*)    All other components within normal limits  BASIC METABOLIC PANEL - Abnormal; Notable for the following components:   GFR, Estimated 54 (*)    All other components within normal limits  URINALYSIS, ROUTINE W REFLEX MICROSCOPIC - Abnormal; Notable for the following components:   APPearance HAZY (*)    Leukocytes,Ua TRACE (*)    Bacteria, UA RARE (*)    All other components within normal limits   EKG  Radiology CT Head Wo Contrast  Result Date: 11/14/2020 CLINICAL DATA:  Trauma. EXAM: CT HEAD WITHOUT CONTRAST CT CERVICAL SPINE WITHOUT CONTRAST TECHNIQUE: Multidetector CT imaging of the head and cervical spine was  performed following the standard protocol without intravenous contrast. Multiplanar CT image reconstructions of the cervical spine were also generated. COMPARISON:  CT head and CT cervical spine from November 10, 2019 FINDINGS: CT HEAD FINDINGS Brain: No evidence of acute infarction, hemorrhage, hydrocephalus, extra-axial collection or mass lesion/mass effect. Mild mineralization along the falx, similar to prior. Similar advanced patchy hypoattenuation in the white matter, compatible with chronic microvascular ischemic disease. Similar diffuse cerebral atrophy with ex vacuo ventricular dilation. Chronic right cerebellar lacunar infarct. Vascular: Calcific atherosclerosis.  No hyperdense vessel identified Skull: No acute fracture.  Left frontal/periorbital contusion. Sinuses/Orbits: Sinuses are largely clear. No air-fluid levels. No acute intraorbital abnormality. Other: No mastoid effusions. CT CERVICAL SPINE FINDINGS Alignment: Similar alignment with slightly exaggerated cervical lordosis. No substantial sagittal subluxation. Skull base and vertebrae: Similar height loss of the C7 vertebral body. No new/progressive vertebral body height loss. No evidence of acute fracture. Diffuse osteopenia. Soft tissues and spinal canal: No prevertebral fluid or swelling. No visible canal hematoma. Disc levels: Mild degenerative disc disease. Multilevel posterior disc bulges, likely greatest at C3-C4 and grossly similar to prior. Multilevel facet arthropathy, greatest on the right at C4-C5. Upper chest: Visualized lung apices are clear with limited evaluation secondary to motion. Aberrant right subclavian artery, which courses posterior to the esophagus. Other: Calcific atherosclerosis of the carotid arteries and the aorta. IMPRESSION: CT head: 1. No evidence of acute intracranial abnormality. 2. Left frontal/periorbital contusion without acute fracture. 3. Advanced chronic microvascular ischemic disease, generalized cerebral  atrophy, and remote right cerebellar lacunar infarct. CT cervical spine: 1. No evidence of acute fracture or traumatic malalignment. 2. Remote C7 compression fracture with similar height loss. 3. Posterior disc bulges and facet arthropathy. Electronically Signed   By: Feliberto Harts MD   On: 11/14/2020 08:53   CT Cervical Spine Wo Contrast  Result Date: 11/14/2020 CLINICAL DATA:  Trauma. EXAM: CT  HEAD WITHOUT CONTRAST CT CERVICAL SPINE WITHOUT CONTRAST TECHNIQUE: Multidetector CT imaging of the head and cervical spine was performed following the standard protocol without intravenous contrast. Multiplanar CT image reconstructions of the cervical spine were also generated. COMPARISON:  CT head and CT cervical spine from November 10, 2019 FINDINGS: CT HEAD FINDINGS Brain: No evidence of acute infarction, hemorrhage, hydrocephalus, extra-axial collection or mass lesion/mass effect. Mild mineralization along the falx, similar to prior. Similar advanced patchy hypoattenuation in the white matter, compatible with chronic microvascular ischemic disease. Similar diffuse cerebral atrophy with ex vacuo ventricular dilation. Chronic right cerebellar lacunar infarct. Vascular: Calcific atherosclerosis.  No hyperdense vessel identified Skull: No acute fracture.  Left frontal/periorbital contusion. Sinuses/Orbits: Sinuses are largely clear. No air-fluid levels. No acute intraorbital abnormality. Other: No mastoid effusions. CT CERVICAL SPINE FINDINGS Alignment: Similar alignment with slightly exaggerated cervical lordosis. No substantial sagittal subluxation. Skull base and vertebrae: Similar height loss of the C7 vertebral body. No new/progressive vertebral body height loss. No evidence of acute fracture. Diffuse osteopenia. Soft tissues and spinal canal: No prevertebral fluid or swelling. No visible canal hematoma. Disc levels: Mild degenerative disc disease. Multilevel posterior disc bulges, likely greatest at C3-C4 and  grossly similar to prior. Multilevel facet arthropathy, greatest on the right at C4-C5. Upper chest: Visualized lung apices are clear with limited evaluation secondary to motion. Aberrant right subclavian artery, which courses posterior to the esophagus. Other: Calcific atherosclerosis of the carotid arteries and the aorta. IMPRESSION: CT head: 1. No evidence of acute intracranial abnormality. 2. Left frontal/periorbital contusion without acute fracture. 3. Advanced chronic microvascular ischemic disease, generalized cerebral atrophy, and remote right cerebellar lacunar infarct. CT cervical spine: 1. No evidence of acute fracture or traumatic malalignment. 2. Remote C7 compression fracture with similar height loss. 3. Posterior disc bulges and facet arthropathy. Electronically Signed   By: Feliberto Harts MD   On: 11/14/2020 08:53    Procedures Procedures (including critical care time)  Medications Ordered in ED Medications  lidocaine-EPINEPHrine (XYLOCAINE W/EPI) 1 %-1:100000 (with pres) injection 10 mL (has no administration in time range)   Initial Impression / Assessment and Plan / ED Course  I have reviewed the triage vital signs and the nursing notes.  Pertinent labs & imaging results that were available during my care of the patient were reviewed by me and considered in my medical decision making (see chart for details).  Gina Hodges is a 89yoF with PMH as listed in HPI presents for unwitnessed fall. Differential diagnosis at this time includes mechanical fall, syncope, CVA. Likely mechanical fall due to baseline dementia. She has previous ED visits for falls after forgetting she cannot ambulate. Unlikely CVA as she is at baseline mental status, no focal neurological deficit, not on anticoagulation. Unlikely to be syncope as she recalls event, no prodrome that she can explain. Has atrial fibrillation but rate controlled at presentation.  Mechanical fall without obvious deformities on exam.  Will CT head/neck, basic labs to r/o infectious causes of confusion.   EKG: rate controlled atrial fibrillation  CT head, c-spine: no acute abnormalities Labs: WBC 13 likely reactive; UA with trace leukocytes, negative nitrates.   Sutured left forehead lac; hemostatic.  Will d/c back to facility    Final Clinical Impressions(s) / ED Diagnoses   Final diagnoses:  Fall, initial encounter  Facial laceration, initial encounter    New Prescriptions New Prescriptions   No medications on file

## 2020-11-14 NOTE — ED Provider Notes (Signed)
[facility] EMERGENCY DEPARTMENT Provider Note   CSN: [id number] Arrival date & time: 11/14/20  [id number]     History Chief Complaint  Patient presents with  . Fall    Gina NoraLora Luzader is a 87 y.o. female.  HPI Level 5 caveat secondary to dementia   87 year old female with history of dementia, paroxysmal A. fib, from skilled nursing facility with report of probable fall.  Patient was last seen last night and found on ground this morning per report.  Per RN triage summary [facility]  Patient was placed in cervical collar prior to arrival they state that they found patient laying down the floor beside the bed on her back.  She is not on blood thinners there is an unknown loss of conscious.  Noted laceration to her left forehead and some abrasion to her chin.  Her vital signs were reported as stable prehospital and on arrival. Patient unreliable historian secondary dementia but did seem to indicate that she had fallen this morning.  She noted that she had some pain in her head but denied any other pain. Past Medical History:  Diagnosis Date  . Anemia   . Anxiety   . Cerebral infarct (HCC)   . Dementia (HCC)   . Dementia with behavioral problem (HCC)   . GERD (gastroesophageal reflux disease)   . Hyperlipidemia   . Osteoporosis   . PAF (paroxysmal atrial fibrillation) (HCC)   . Stroke (HCC)   . Thyroid disease   . Ulcerative esophagitis   . UTI (urinary tract infection)   . Vertigo     Patient Active Problem List   Diagnosis Date Noted  . Sepsis (HCC) 11/10/2019  . AKI (acute kidney injury) (HCC) 11/10/2019  . Fall 08/13/2019  . Ingrown nail 11/12/2013  . Compression fracture of spine (HCC) 11/07/2013  . Hypothyroidism 11/07/2013  . Hyperlipidemia   . Dementia with behavioral problem (HCC)   . GERD (gastroesophageal reflux disease)   . PAF (paroxysmal atrial fibrillation) (HCC)   .  Generalized anxiety disorder 04/05/2013    Past Surgical History:  Procedure Laterality Date  . ABDOMINAL HYSTERECTOMY    . APPENDECTOMY    . CHOLECYSTECTOMY       OB History   No obstetric history on file.     Family History  Problem Relation Age of Onset  . Depression Mother   . Bipolar disorder Daughter   . Drug abuse Son     Social History   Tobacco Use  . Smoking status: Never Smoker  . Smokeless tobacco: Never Used  Substance Use Topics  . Alcohol use: No  . Drug use: No    Home Medications Prior to Admission medications   Medication Sig Start Date End Date Taking? Authorizing Provider  acetaminophen (TYLENOL) 650 MG CR tablet Take 650 mg by mouth 3 (three) times daily.     [provider]  ALPRAZolam Prudy Feeler(XANAX) 0.5 MG tablet Take 1 tablet (0.5 mg total) by mouth 2 (two) times daily. 11/13/19   Ghimire, Werner LeanShanker M, MD  alum & mag hydroxide-simeth (MAALOX/MYLANTA) 200-200-20 MG/5ML suspension Take 30 mLs by mouth every 8 (eight) hours as needed for indigestion or heartburn.     [provider]  atorvastatin (LIPITOR) 10 MG tablet Take 5 mg by mouth at bedtime.  02/27/13   [provider]  Calcium Carbonate-Vitamin D 600-400 MG-UNIT tablet Take 1 tablet by mouth 2 (two) times  daily.     [provider]  levothyroxine (SYNTHROID, LEVOTHROID) 75 MCG tablet Take 75 mcg by mouth daily before breakfast.  02/27/13   [provider]  Lidocaine (SALONPAS PAIN RELIEVING) 4 % PTCH Apply 1 patch topically See admin instructions. Apply 1 patch to affected area(s) two times a day and remove, per schedule    [provider]  loperamide (IMODIUM) 2 MG capsule Take 2-4 mg by mouth See admin instructions. Take 2 capsules (4 mg) by mouth at first loose stool, then take 1 capsule (2 mg) every 2 hours as needed for diarrhea/loose stools 11/08/18   [provider]  loratadine (CLARITIN) 10 MG tablet Take 10 mg by mouth daily.    [provider]  Melatonin 10 MG TABS Take 10 mg by mouth at bedtime.     [provider]  meloxicam (MOBIC) 15 MG tablet Take 15 mg by mouth daily. 10/30/18   [provider]  Multiple Vitamin (MULTIVITAMIN WITH MINERALS) TABS tablet Take 1 tablet by mouth daily.     [provider]  omeprazole (PRILOSEC) 20 MG capsule Take 20 mg by mouth 2 (two) times daily. 10/17/18   [provider]  polyethylene glycol (MIRALAX / GLYCOLAX) packet Take 17 g by mouth daily as needed for mild constipation (MIX AND DRINK).     [provider]  senna (SENOKOT) 8.6 MG TABS tablet Take 1 tablet by mouth daily.    [provider]  traZODone (DESYREL) 150 MG tablet Take 75 mg by mouth at bedtime. 10/27/18   [provider]  venlafaxine XR (EFFEXOR-XR) 75 MG 24 hr capsule Take 75 mg by mouth daily with breakfast.    [provider]    Allergies    Penicillins, Bactrim [sulfamethoxazole-trimethoprim], Sulfa antibiotics, and Codeine  Review of Systems   Review of Systems  Unable to perform ROS: Dementia    Physical Exam Updated Vital Signs BP (!) 154/91   Pulse 79   Temp 98 F (36.7 C) (Oral)   Resp 19   Ht 1.549 m (5\' 1" )   Wt 68.5 kg   SpO2 97%   BMI 28.53 kg/m   Physical Exam Vitals reviewed.  Constitutional:      Appearance: Normal appearance.  HENT:     Head: Normocephalic.     Comments: Abrasion and 2 cm laceration left forehead Abrasion to chin    Right Ear: External ear normal.     Left Ear: External ear normal.     Nose: Nose normal.  Eyes:     Comments: Pupils are small  Neck:     Comments: Cervical collar is in place Cardiovascular:     Rate and Rhythm: Normal rate. Rhythm irregular.     Pulses: Normal pulses.  Pulmonary:     Effort: Pulmonary effort is normal.     Breath sounds: Normal breath sounds.  Abdominal:     General: Abdomen is flat.     Palpations: Abdomen is soft.  Musculoskeletal:         General: Normal range of motion.     Comments: Pelvis appears stable There is not appear to be any point tenderness in her hips. There is left range of motion of her lower extremities and upper extremities. No obvious external signs of trauma with the exception of an abrasion on her right knee  Skin:    General: Skin is warm and dry.     Capillary Refill: Capillary refill takes  less than 2 seconds.  Neurological:     General: No focal deficit present.     Mental Status: She is alert.  Psychiatric:        Mood and Affect: Mood normal.     ED Results / Procedures / Treatments   Labs (all labs ordered are listed, but only abnormal results are displayed) Labs Reviewed  CBC - Abnormal; Notable for the following components:      Result Value   WBC 13.2 (*)    MCHC 29.1 (*)    RDW 15.6 (*)    All other components within normal limits  BASIC METABOLIC PANEL - Abnormal; Notable for the following components:   GFR, Estimated 54 (*)    All other components within normal limits  URINALYSIS, ROUTINE W REFLEX MICROSCOPIC    EKG EKG Interpretation  Date/Time:  Friday November 14 2020 07:54:45 EST Ventricular Rate:  80 PR Interval:    QRS Duration: 92 QT Interval:  413 QTC Calculation: 477 R Axis:   -18 Text Interpretation: Atrial fibrillation Borderline left axis deviation Low voltage, precordial leads Anteroseptal infarct, old No significant change since last tracing 12 October 21 Confirmed by Margarita Grizzle [phone] on 11/14/2020 9:17:18 AM   Radiology CT Head Wo Contrast  Result Date: 11/14/2020 CLINICAL DATA:  Trauma. EXAM: CT HEAD WITHOUT CONTRAST CT CERVICAL SPINE WITHOUT CONTRAST TECHNIQUE: Multidetector CT imaging of the head and cervical spine was performed following the standard protocol without intravenous contrast. Multiplanar CT image reconstructions of the cervical spine were also generated. COMPARISON:  CT head and CT cervical spine from November 10, 2019 FINDINGS: CT HEAD  FINDINGS Brain: No evidence of acute infarction, hemorrhage, hydrocephalus, extra-axial collection or mass lesion/mass effect. Mild mineralization along the falx, similar to prior. Similar advanced patchy hypoattenuation in the white matter, compatible with chronic microvascular ischemic disease. Similar diffuse cerebral atrophy with ex vacuo ventricular dilation. Chronic right cerebellar lacunar infarct. Vascular: Calcific atherosclerosis.  No hyperdense vessel identified Skull: No acute fracture.  Left frontal/periorbital contusion. Sinuses/Orbits: Sinuses are largely clear. No air-fluid levels. No acute intraorbital abnormality. Other: No mastoid effusions. CT CERVICAL SPINE FINDINGS Alignment: Similar alignment with slightly exaggerated cervical lordosis. No substantial sagittal subluxation. Skull base and vertebrae: Similar height loss of the C7 vertebral body. No new/progressive vertebral body height loss. No evidence of acute fracture. Diffuse osteopenia. Soft tissues and spinal canal: No prevertebral fluid or swelling. No visible canal hematoma. Disc levels: Mild degenerative disc disease. Multilevel posterior disc bulges, likely greatest at C3-C4 and grossly similar to prior. Multilevel facet arthropathy, greatest on the right at C4-C5. Upper chest: Visualized lung apices are clear with limited evaluation secondary to motion. Aberrant right subclavian artery, which courses posterior to the esophagus. Other: Calcific atherosclerosis of the carotid arteries and the aorta. IMPRESSION: CT head: 1. No evidence of acute intracranial abnormality. 2. Left frontal/periorbital contusion without acute fracture. 3. Advanced chronic microvascular ischemic disease, generalized cerebral atrophy, and remote right cerebellar lacunar infarct. CT cervical spine: 1. No evidence of acute fracture or traumatic malalignment. 2. Remote C7 compression fracture with similar height loss. 3. Posterior disc bulges and facet  arthropathy. Electronically Signed   By: Feliberto Harts MD   On: 11/14/2020 08:53   CT Cervical Spine Wo Contrast  Result Date: 11/14/2020 CLINICAL DATA:  Trauma. EXAM: CT HEAD WITHOUT CONTRAST CT CERVICAL SPINE WITHOUT CONTRAST TECHNIQUE: Multidetector CT imaging of the head and cervical spine was performed following the standard protocol without intravenous contrast. Multiplanar  CT image reconstructions of the cervical spine were also generated. COMPARISON:  CT head and CT cervical spine from November 10, 2019 FINDINGS: CT HEAD FINDINGS Brain: No evidence of acute infarction, hemorrhage, hydrocephalus, extra-axial collection or mass lesion/mass effect. Mild mineralization along the falx, similar to prior. Similar advanced patchy hypoattenuation in the white matter, compatible with chronic microvascular ischemic disease. Similar diffuse cerebral atrophy with ex vacuo ventricular dilation. Chronic right cerebellar lacunar infarct. Vascular: Calcific atherosclerosis.  No hyperdense vessel identified Skull: No acute fracture.  Left frontal/periorbital contusion. Sinuses/Orbits: Sinuses are largely clear. No air-fluid levels. No acute intraorbital abnormality. Other: No mastoid effusions. CT CERVICAL SPINE FINDINGS Alignment: Similar alignment with slightly exaggerated cervical lordosis. No substantial sagittal subluxation. Skull base and vertebrae: Similar height loss of the C7 vertebral body. No new/progressive vertebral body height loss. No evidence of acute fracture. Diffuse osteopenia. Soft tissues and spinal canal: No prevertebral fluid or swelling. No visible canal hematoma. Disc levels: Mild degenerative disc disease. Multilevel posterior disc bulges, likely greatest at C3-C4 and grossly similar to prior. Multilevel facet arthropathy, greatest on the right at C4-C5. Upper chest: Visualized lung apices are clear with limited evaluation secondary to motion. Aberrant right subclavian artery, which courses  posterior to the esophagus. Other: Calcific atherosclerosis of the carotid arteries and the aorta. IMPRESSION: CT head: 1. No evidence of acute intracranial abnormality. 2. Left frontal/periorbital contusion without acute fracture. 3. Advanced chronic microvascular ischemic disease, generalized cerebral atrophy, and remote right cerebellar lacunar infarct. CT cervical spine: 1. No evidence of acute fracture or traumatic malalignment. 2. Remote C7 compression fracture with similar height loss. 3. Posterior disc bulges and facet arthropathy. Electronically Signed   By: Feliberto Harts MD   On: 11/14/2020 08:53    Procedures Procedures   Medications Ordered in ED Medications  lidocaine-EPINEPHrine (XYLOCAINE W/EPI) 1 %-1:100000 (with pres) injection 10 mL (has no administration in time range)    ED Course  I have reviewed the triage vital signs and the nursing notes.  Pertinent labs & imaging results that were available during my care of the patient were reviewed by me and considered in my medical decision making (see chart for details).    MDM Rules/Calculators/A&P                          87 year old female from nursing home with suspected fall.  Patient has history of dementia is not able to significantly contribute to her history.  On exam Here, she had a cervical collar in place, laceration to face and abrasion to face.  Imaging of head and neck did not reveal any acute abnormality.  Laceration of face is repaired.  Please see separate laceration repair note.  CBC, chemistry, and urine obtained without any evidence of acute abnormality.  Patient with mild leukocytosis at 13,200, but no physical exam signs of infection and patient is afebrile.  Urinalysis without significant suspicion for infection.  Urine was obtained through suction device.  Remainder of CBC chemistries appear within normal limits for age.  Is to be stood to assure that she does not have significant pain in her pelvis or  hips but did not reveal any significant abnormality on her exam in the bed.  As long as this is normal the plan is to discharge patient back to her skilled nursing facility. Final Clinical Impression(s) / ED Diagnoses Final diagnoses:  Fall, initial encounter  Facial laceration, initial encounter    Rx /  DC Orders ED Discharge Orders    None       Margarita Grizzle, MD 11/14/20 1309

## 2021-03-05 ENCOUNTER — Other Ambulatory Visit: Payer: Self-pay

## 2021-03-05 ENCOUNTER — Emergency Department (HOSPITAL_COMMUNITY): Payer: Medicare HMO

## 2021-03-05 ENCOUNTER — Inpatient Hospital Stay (HOSPITAL_COMMUNITY)
Admission: EM | Admit: 2021-03-05 | Discharge: 2021-03-09 | DRG: 871 | Disposition: A | Discharge status: Home / Self Care | Payer: Medicare HMO | Source: Skilled Nursing Facility | Attending: Internal Medicine | Admitting: Internal Medicine

## 2021-03-05 DIAGNOSIS — J159 Unspecified bacterial pneumonia: Secondary | ICD-10-CM | POA: Diagnosis present

## 2021-03-05 DIAGNOSIS — J9601 Acute respiratory failure with hypoxia: Secondary | ICD-10-CM | POA: Diagnosis present

## 2021-03-05 DIAGNOSIS — A419 Sepsis, unspecified organism: Principal | ICD-10-CM | POA: Diagnosis present

## 2021-03-05 DIAGNOSIS — Z880 Allergy status to penicillin: Secondary | ICD-10-CM

## 2021-03-05 DIAGNOSIS — Z791 Long term (current) use of non-steroidal anti-inflammatories (NSAID): Secondary | ICD-10-CM

## 2021-03-05 DIAGNOSIS — Z79890 Hormone replacement therapy (postmenopausal): Secondary | ICD-10-CM

## 2021-03-05 DIAGNOSIS — Z79899 Other long term (current) drug therapy: Secondary | ICD-10-CM

## 2021-03-05 DIAGNOSIS — Z885 Allergy status to narcotic agent status: Secondary | ICD-10-CM

## 2021-03-05 DIAGNOSIS — Z881 Allergy status to other antibiotic agents status: Secondary | ICD-10-CM

## 2021-03-05 DIAGNOSIS — G4730 Sleep apnea, unspecified: Secondary | ICD-10-CM | POA: Diagnosis present

## 2021-03-05 DIAGNOSIS — Z66 Do not resuscitate: Secondary | ICD-10-CM | POA: Diagnosis present

## 2021-03-05 DIAGNOSIS — F419 Anxiety disorder, unspecified: Secondary | ICD-10-CM | POA: Diagnosis present

## 2021-03-05 DIAGNOSIS — R0902 Hypoxemia: Secondary | ICD-10-CM | POA: Diagnosis not present

## 2021-03-05 DIAGNOSIS — F32A Depression, unspecified: Secondary | ICD-10-CM | POA: Diagnosis present

## 2021-03-05 DIAGNOSIS — J189 Pneumonia, unspecified organism: Secondary | ICD-10-CM | POA: Diagnosis present

## 2021-03-05 DIAGNOSIS — Z20822 Contact with and (suspected) exposure to covid-19: Secondary | ICD-10-CM | POA: Diagnosis present

## 2021-03-05 DIAGNOSIS — M810 Age-related osteoporosis without current pathological fracture: Secondary | ICD-10-CM | POA: Diagnosis present

## 2021-03-05 DIAGNOSIS — F0390 Unspecified dementia without behavioral disturbance: Secondary | ICD-10-CM | POA: Diagnosis present

## 2021-03-05 DIAGNOSIS — J690 Pneumonitis due to inhalation of food and vomit: Secondary | ICD-10-CM | POA: Diagnosis present

## 2021-03-05 DIAGNOSIS — E039 Hypothyroidism, unspecified: Secondary | ICD-10-CM | POA: Diagnosis present

## 2021-03-05 DIAGNOSIS — G4700 Insomnia, unspecified: Secondary | ICD-10-CM | POA: Diagnosis present

## 2021-03-05 DIAGNOSIS — E785 Hyperlipidemia, unspecified: Secondary | ICD-10-CM | POA: Diagnosis present

## 2021-03-05 DIAGNOSIS — I4821 Permanent atrial fibrillation: Secondary | ICD-10-CM | POA: Diagnosis present

## 2021-03-05 DIAGNOSIS — R296 Repeated falls: Secondary | ICD-10-CM | POA: Diagnosis present

## 2021-03-05 DIAGNOSIS — Z8673 Personal history of transient ischemic attack (TIA), and cerebral infarction without residual deficits: Secondary | ICD-10-CM

## 2021-03-05 LAB — CBC WITH DIFFERENTIAL/PLATELET
Abs Immature Granulocytes: 0.06 10*3/uL (ref 0.00–0.07)
Basophils Absolute: 0 10*3/uL (ref 0.0–0.1)
Basophils Relative: 0 %
Eosinophils Absolute: 0.5 10*3/uL (ref 0.0–0.5)
Eosinophils Relative: 4 %
HCT: 31.5 % — ABNORMAL LOW (ref 36.0–46.0)
Hemoglobin: 10 g/dL — ABNORMAL LOW (ref 12.0–15.0)
Immature Granulocytes: 1 %
Lymphocytes Relative: 16 %
Lymphs Abs: 1.9 10*3/uL (ref 0.7–4.0)
MCH: 29.9 pg (ref 26.0–34.0)
MCHC: 31.7 g/dL (ref 30.0–36.0)
MCV: 94 fL (ref 80.0–100.0)
Monocytes Absolute: 1.8 10*3/uL — ABNORMAL HIGH (ref 0.1–1.0)
Monocytes Relative: 15 %
Neutro Abs: 7.6 10*3/uL (ref 1.7–7.7)
Neutrophils Relative %: 64 %
Platelets: 300 10*3/uL (ref 150–400)
RBC: 3.35 MIL/uL — ABNORMAL LOW (ref 3.87–5.11)
RDW: 14.1 % (ref 11.5–15.5)
WBC: 11.9 10*3/uL — ABNORMAL HIGH (ref 4.0–10.5)
nRBC: 0 % (ref 0.0–0.2)

## 2021-03-05 LAB — TROPONIN I (HIGH SENSITIVITY)
Troponin I (High Sensitivity): 36 ng/L — ABNORMAL HIGH (ref <18)
Troponin I (High Sensitivity): 9 ng/L (ref <18)

## 2021-03-05 LAB — COMPREHENSIVE METABOLIC PANEL
ALT: 5 U/L (ref 0–44)
AST: 17 U/L (ref 15–41)
Albumin: 2.7 g/dL — ABNORMAL LOW (ref 3.5–5.0)
Alkaline Phosphatase: 75 U/L (ref 38–126)
Anion gap: 8 (ref 5–15)
BUN: 16 mg/dL (ref 8–23)
CO2: 24 mmol/L (ref 22–32)
Calcium: 8.1 mg/dL — ABNORMAL LOW (ref 8.9–10.3)
Chloride: 101 mmol/L (ref 98–111)
Creatinine, Ser: 0.79 mg/dL (ref 0.44–1.00)
GFR, Estimated: >60 mL/min (ref >60)
Glucose, Bld: 109 mg/dL — ABNORMAL HIGH (ref 70–99)
Potassium: 3.6 mmol/L (ref 3.5–5.1)
Sodium: 133 mmol/L — ABNORMAL LOW (ref 135–145)
Total Bilirubin: 0.6 mg/dL (ref 0.3–1.2)
Total Protein: 5.6 g/dL — ABNORMAL LOW (ref 6.5–8.1)

## 2021-03-05 LAB — I-STAT VENOUS BLOOD GAS, ED
Acid-Base Excess: 4 mmol/L — ABNORMAL HIGH (ref 0.0–2.0)
Bicarbonate: 25.2 mmol/L (ref 20.0–28.0)
Calcium, Ion: 0.94 mmol/L — ABNORMAL LOW (ref 1.15–1.40)
HCT: 31 % — ABNORMAL LOW (ref 36.0–46.0)
Hemoglobin: 10.5 g/dL — ABNORMAL LOW (ref 12.0–15.0)
O2 Saturation: 99 %
Potassium: 4 mmol/L (ref 3.5–5.1)
Sodium: 133 mmol/L — ABNORMAL LOW (ref 135–145)
TCO2: 26 mmol/L (ref 22–32)
pCO2, Ven: 25.4 mmHg — ABNORMAL LOW (ref 44.0–60.0)
pH, Ven: 7.605 (ref 7.250–7.430)
pO2, Ven: 121 mmHg — ABNORMAL HIGH (ref 32.0–45.0)

## 2021-03-05 LAB — LACTIC ACID, PLASMA
Lactic Acid, Venous: 0.9 mmol/L (ref 0.5–1.9)
Lactic Acid, Venous: 1.1 mmol/L (ref 0.5–1.9)

## 2021-03-05 MED ORDER — LACTATED RINGERS IV BOLUS (SEPSIS)
1000.0000 mL | Freq: Once | INTRAVENOUS | Status: AC
  Administered 2021-03-05: 1000 mL via INTRAVENOUS

## 2021-03-05 MED ORDER — VANCOMYCIN HCL 750 MG/150ML IV SOLN: 750.0000 mg | INTRAVENOUS | Status: DC

## 2021-03-05 MED ORDER — IOHEXOL 350 MG/ML SOLN
75.0000 mL | Freq: Once | INTRAVENOUS | Status: AC | PRN
  Administered 2021-03-05: 75 mL via INTRAVENOUS

## 2021-03-05 MED ORDER — SODIUM CHLORIDE 0.9 % IV SOLN
2.0000 g | INTRAVENOUS | Status: DC
  Administered 2021-03-06 – 2021-03-07 (×2): 2 g via INTRAVENOUS
  Filled 2021-03-05 (×2): qty 2

## 2021-03-05 MED ORDER — VANCOMYCIN HCL 1500 MG/300ML IV SOLN
1500.0000 mg | Freq: Once | INTRAVENOUS | Status: AC
  Administered 2021-03-05: 1500 mg via INTRAVENOUS
  Filled 2021-03-05: qty 300

## 2021-03-05 MED ORDER — SODIUM CHLORIDE 0.9 % IV SOLN
2.0000 g | Freq: Once | INTRAVENOUS | Status: AC
  Administered 2021-03-05: 2 g via INTRAVENOUS
  Filled 2021-03-05: qty 2

## 2021-03-05 MED ORDER — ASPIRIN EC 325 MG PO TBEC
325.0000 mg | DELAYED_RELEASE_TABLET | Freq: Once | ORAL | Status: AC
  Administered 2021-03-05: 325 mg via ORAL
  Filled 2021-03-05: qty 1

## 2021-03-05 MED ORDER — ASPIRIN 81 MG PO CHEW: 324.0000 mg | CHEWABLE_TABLET | Freq: Once | ORAL | Status: DC

## 2021-03-05 NOTE — ED Provider Notes (Signed)
[facility] Provider Note   CSN: [id number] Arrival date & time: 03/05/21  1936     History Chief Complaint  Patient presents with  . Shortness of Breath    Gina Hodges is a 87 y.o. female.  The history is provided by the patient and the EMS personnel.  Shortness of Breath Severity:  Moderate Onset quality:  Gradual Duration:  3 days Timing:  Constant Progression:  Worsening Chronicity:  New Context: not activity, not animal exposure, not emotional upset, not fumes, not known allergens, not occupational exposure, not pollens, not smoke exposure, not strong odors, not URI and not weather changes   Relieved by:  None tried Worsened by:  Deep breathing, exertion and activity Ineffective treatments:  None tried Associated symptoms: cough, fever (subjective) and sputum production   Associated symptoms: no abdominal pain, no chest pain, no claudication, no diaphoresis, no ear pain, no headaches, no hemoptysis, no neck pain, no PND, no rash, no sore throat, no syncope, no swollen glands, no vomiting and no wheezing     Does not require supplemental oxygen at baseline. Has been having worsening shortness of breath apparently for at least 2-3 days. Skilled nurse facility     Past Medical History:  Diagnosis Date  . Anemia   . Anxiety   . Cerebral infarct (HCC)   . Dementia (HCC)   . Dementia with behavioral problem (HCC)   . GERD (gastroesophageal reflux disease)   . Hyperlipidemia   . Osteoporosis   . PAF (paroxysmal atrial fibrillation) (HCC)   . Stroke (HCC)   . Thyroid disease   . Ulcerative esophagitis   . UTI (urinary tract infection)   . Vertigo     Patient Active Problem List   Diagnosis Date Noted  . Sepsis (HCC) 11/10/2019  . AKI (acute kidney injury) (HCC) 11/10/2019  . Fall 08/13/2019  . Ingrown nail 11/12/2013  . Compression fracture of spine (HCC) 11/07/2013  . Hypothyroidism 11/07/2013  . Hyperlipidemia   .  Dementia with behavioral problem (HCC)   . GERD (gastroesophageal reflux disease)   . PAF (paroxysmal atrial fibrillation) (HCC)   . Generalized anxiety disorder 04/05/2013    Past Surgical History:  Procedure Laterality Date  . ABDOMINAL HYSTERECTOMY    . APPENDECTOMY    . CHOLECYSTECTOMY       OB History   No obstetric history on file.     Family History  Problem Relation Age of Onset  . Depression Mother   . Bipolar disorder Daughter   . Drug abuse Son     Social History   Tobacco Use  . Smoking status: Never Smoker  . Smokeless tobacco: Never Used  Substance Use Topics  . Alcohol use: No  . Drug use: No    Home Medications Prior to Admission medications   Medication Sig Start Date End Date Taking? Authorizing Provider  acetaminophen (TYLENOL) 650 MG CR tablet Take 650 mg by mouth 3 (three) times daily.   Yes [provider]  ALPRAZolam (XANAX) 0.5 MG tablet Take 1 tablet (0.5 mg total) by mouth 2 (two) times daily. 11/13/19  Yes Ghimire, Werner LeanShanker M, MD  alum & mag hydroxide-simeth (MAALOX/MYLANTA) 200-200-20 MG/5ML suspension Take 30 mLs by mouth every 8 (eight) hours as needed for indigestion or heartburn.    Yes [provider]  atorvastatin (LIPITOR) 10 MG tablet Take 5 mg by mouth at bedtime.  02/27/13  Yes [provider]  Calcium Carbonate-Vitamin D 600-400  MG-UNIT tablet Take 1 tablet by mouth 2 (two) times daily.    Yes [provider]  levothyroxine (SYNTHROID, LEVOTHROID) 75 MCG tablet Take 75 mcg by mouth daily before breakfast.  02/27/13  Yes [provider]  Lidocaine 4 % PTCH Apply 1 patch topically See admin instructions. Apply 1 patch to affected area(s) two times a day and remove, per schedule   Yes [provider]  loperamide (IMODIUM) 2 MG capsule Take 2-4 mg by mouth See admin instructions. Take 2 capsules (4 mg) by mouth at first loose stool, then take 1 capsule (2 mg) every 2 hours as needed for  diarrhea/loose stools 11/08/18  Yes [provider]  loratadine (CLARITIN) 10 MG tablet Take 10 mg by mouth daily.   Yes [provider]  Melatonin 10 MG TABS Take 10 mg by mouth at bedtime.    Yes [provider]  meloxicam (MOBIC) 15 MG tablet Take 15 mg by mouth daily. 10/30/18  Yes [provider]  mirtazapine (REMERON) 15 MG tablet Take 15 mg by mouth at bedtime. 03/03/21  Yes [provider]  Multiple Vitamin (MULTIVITAMIN WITH MINERALS) TABS tablet Take 1 tablet by mouth daily.    Yes [provider]  omeprazole (PRILOSEC) 20 MG capsule Take 20 mg by mouth 2 (two) times daily. 10/17/18  Yes [provider]  polyethylene glycol (MIRALAX / GLYCOLAX) packet Take 17 g by mouth daily as needed for mild constipation (MIX AND DRINK).    Yes [provider]  senna (SENOKOT) 8.6 MG TABS tablet Take 1 tablet by mouth daily.   Yes [provider]  traZODone (DESYREL) 150 MG tablet Take 75 mg by mouth at bedtime. 10/27/18  Yes [provider]  venlafaxine XR (EFFEXOR-XR) 75 MG 24 hr capsule Take 75 mg by mouth daily with breakfast.   Yes [provider]    Allergies    Penicillins, Bactrim [sulfamethoxazole-trimethoprim], Sulfa antibiotics, and Codeine  Review of Systems   Review of Systems  Constitutional: Positive for fever (subjective). Negative for chills and diaphoresis.  HENT: Negative for ear pain and sore throat.   Eyes: Negative for pain and visual disturbance.  Respiratory: Positive for cough, sputum production and shortness of breath. Negative for hemoptysis and wheezing.   Cardiovascular: Negative for chest pain, palpitations, claudication, syncope and PND.  Gastrointestinal: Negative for abdominal pain and vomiting.  Genitourinary: Negative for dysuria and hematuria.  Musculoskeletal: Negative for arthralgias, back pain and neck pain.  Skin: Negative for color change and rash.   Neurological: Negative for seizures, syncope and headaches.  All other systems reviewed and are negative.   Physical Exam Updated Vital Signs BP 139/66   Pulse 88   Temp 99.1 F (37.3 C) (Rectal)   Resp 20   Ht 5\' 1"  (1.549 m)   Wt 65.8 kg   SpO2 100%   BMI 27.40 kg/m   Physical Exam Vitals and nursing note reviewed.  Constitutional:      General: She is not in acute distress.    Appearance: She is well-developed. She is ill-appearing. She is not toxic-appearing.     Interventions: Nasal cannula in place.  HENT:     Head: Normocephalic and atraumatic.     Mouth/Throat:     Mouth: Mucous membranes are dry.  Eyes:     Conjunctiva/sclera: Conjunctivae normal.  Cardiovascular:     Rate and Rhythm: Regular rhythm. Tachycardia present.     Pulses: Normal pulses.  Heart sounds: Normal heart sounds. No murmur heard.   Pulmonary:     Effort: Tachypnea and accessory muscle usage present. No respiratory distress or retractions.     Breath sounds: Examination of the right-middle field reveals rhonchi. Examination of the left-middle field reveals rhonchi. Rhonchi present.  Abdominal:     General: Abdomen is flat.     Palpations: Abdomen is soft.     Tenderness: There is no abdominal tenderness.  Musculoskeletal:     Cervical back: Neck supple.     Right lower leg: No edema.     Left lower leg: No edema.  Skin:    General: Skin is warm and dry.  Neurological:     General: No focal deficit present.     Mental Status: She is alert and oriented to person, place, and time.     GCS: GCS eye subscore is 4. GCS verbal subscore is 5. GCS motor subscore is 6.  Psychiatric:        Behavior: Behavior is cooperative.     ED Results / Procedures / Treatments   Labs (all labs ordered are listed, but only abnormal results are displayed) Labs Reviewed  COMPREHENSIVE METABOLIC PANEL - Abnormal; Notable for the following components:      Result Value   Sodium 133 (*)    Glucose,  Bld 109 (*)    Calcium 8.1 (*)    Total Protein 5.6 (*)    Albumin 2.7 (*)    All other components within normal limits  CBC WITH DIFFERENTIAL/PLATELET - Abnormal; Notable for the following components:   WBC 11.9 (*)    RBC 3.35 (*)    Hemoglobin 10.0 (*)    HCT 31.5 (*)    Monocytes Absolute 1.8 (*)    All other components within normal limits  I-STAT VENOUS BLOOD GAS, ED - Abnormal; Notable for the following components:   pH, Ven 7.605 (*)    pCO2, Ven 25.4 (*)    pO2, Ven 121.0 (*)    Acid-Base Excess 4.0 (*)    Sodium 133 (*)    Calcium, Ion 0.94 (*)    HCT 31.0 (*)    Hemoglobin 10.5 (*)    All other components within normal limits  TROPONIN I (HIGH SENSITIVITY) - Abnormal; Notable for the following components:   Troponin I (High Sensitivity) 36 (*)    All other components within normal limits  CULTURE, BLOOD (SINGLE)  URINE CULTURE  SARS CORONAVIRUS 2 (TAT 6-24 HRS)  RESP PANEL BY RT-PCR (FLU A&B, COVID) ARPGX2  LACTIC ACID, PLASMA  LACTIC ACID, PLASMA  URINALYSIS, ROUTINE W REFLEX MICROSCOPIC  BRAIN NATRIURETIC PEPTIDE  TROPONIN I (HIGH SENSITIVITY)    EKG None  Radiology CT Angio Chest PE W and/or Wo Contrast  Result Date: 03/05/2021 CLINICAL DATA:  PE suspected, high prob Hypoxia and increased fatigue. EXAM: CT ANGIOGRAPHY CHEST WITH CONTRAST TECHNIQUE: Multidetector CT imaging of the chest was performed using the standard protocol during bolus administration of intravenous contrast. Multiplanar CT image reconstructions and MIPs were obtained to evaluate the vascular anatomy. CONTRAST:  58mL OMNIPAQUE IOHEXOL 350 MG/ML SOLN COMPARISON:  Radiograph earlier today.  Chest CTA 11/17/2018 FINDINGS: Cardiovascular: There are no filling defects within the pulmonary arteries to suggest pulmonary embolus. The thoracic aorta is tortuous with atherosclerosis. However right subclavian artery courses posterior to the esophagus, variant anatomy. Heart is normal in size. There are  coronary artery calcifications. No pericardial effusion. Mediastinum/Nodes: Large hiatal hernia with majority of the  stomach being intrathoracic. No esophageal wall thickening. No thyroid nodule. No mediastinal adenopathy. Small mediastinal lymph nodes are all subcentimeter short axis. Lungs/Pleura: Moderate bronchial thickening with areas of bronchial filling and mucoid impaction involving the right greater than left upper lobe, and right greater than left lower lobe. Perifissural opacity in the dependent right greater than left upper lobes. Dependent right lower lobe opacity with small pleural effusion. Compressive atelectasis in the left lower lobe adjacent to hiatal hernia. Minimal patchy and nodular right middle lobe opacity. Upper Abdomen: No acute findings in the included upper abdomen. Musculoskeletal: Multiple chronic thoracic vertebral compression fractures, with augmentation of T8 and T12. Similar additional compression fractures of T3, T6, and T11. No new compression fracture or acute osseous abnormality. Review of the MIP images confirms the above findings. IMPRESSION: 1. No pulmonary embolus. 2. Moderate bronchial thickening with areas of bronchial filling and mucoid impaction involving the right greater than left upper lobe, and right greater than left lower lobe. Findings may be due to bronchitis or aspiration. 3. Dependent airspace opacities in the upper lobes and right lower lobe with small right effusion. Pneumonia versus aspiration. Minimal patchy and nodular right middle lobe opacity is likely infectious. 4. Large hiatal hernia with majority of the stomach being intrathoracic. 5. Multiple chronic thoracic vertebral compression fractures. Aortic Atherosclerosis (ICD10-I70.0). Electronically Signed   By: Narda Rutherford M.D.   On: 03/05/2021 23:13   DG Chest Port 1 View  Result Date: 03/05/2021 CLINICAL DATA:  Hypoxia and possible sepsis.  Tachypnea. EXAM: PORTABLE CHEST 1 VIEW COMPARISON:   07/15/2020 FINDINGS: Atherosclerotic calcification of the aortic arch. Retrocardiac density corresponds in location to the location of a known moderate-sized hiatal hernia and accordingly may simply represent fluid-filled stomach and adjacent passive atelectasis. No blunting of the costophrenic angles. Thoracic spondylosis.  Prior lower thoracic vertebral augmentation. Degenerative glenohumeral arthropathy on the left. IMPRESSION: 1. Retrocardiac opacity probably represents fluid-filled hiatal hernia and adjacent passive atelectasis. 2.  Aortic Atherosclerosis (ICD10-I70.0). 3. Thoracic spondylosis. Electronically Signed   By: Gaylyn Rong M.D.   On: 03/05/2021 20:28    Procedures Procedures   Medications Ordered in ED Medications  ceFEPIme (MAXIPIME) 2 g in sodium chloride 0.9 % 100 mL IVPB (has no administration in time range)  vancomycin (VANCOREADY) IVPB 750 mg/150 mL (has no administration in time range)  lactated ringers bolus 1,000 mL (0 mLs Intravenous Stopped 03/05/21 2133)  vancomycin (VANCOREADY) IVPB 1500 mg/300 mL (0 mg Intravenous Stopped 03/06/21 0018)  ceFEPIme (MAXIPIME) 2 g in sodium chloride 0.9 % 100 mL IVPB (0 g Intravenous Stopped 03/05/21 2148)  aspirin EC tablet 325 mg (325 mg Oral Given 03/05/21 2346)  iohexol (OMNIPAQUE) 350 MG/ML injection 75 mL (75 mLs Intravenous Contrast Given 03/05/21 2254)    ED Course  I have reviewed the triage vital signs and the nursing notes.  Pertinent labs & imaging results that were available during my care of the patient were reviewed by me and considered in my medical decision making (see chart for details).    MDM Rules/Calculators/A&P                          This is a 87 year old female with history of atrial fibrillation, hyperlipidemia and hypertension who presented to the emergency department from skilled nursing facility after she was found to be hypoxic into the 60s on room air. Has been having shortness of breath and cough for  the past 3 days.  Not on oxygen at baseline. No reported documented fever however subjective fever and chills. In the emergency department she was tachycardic, slightly hypertensive however afebrile. Work-up and treatment for sepsis began on arrival.  Anticipate pulmonary etiology. Administered broad-spectrum antibiotics. Chest x-ray without clear evidence of consolidative pneumonia.  Also considered pulmonary embolism however CTA without evidence of VTE but did show evidence of likely aspiration pneumonia. Also considered ACS.  EKG with atrial fibrillation, no STEMI, no evidence of acute ischemia. Initial troponin was elevated however delta negative.  Still requiring supplemental oxygen. Blood pressure improved after crystalloid infusion. Not requiring vasopressors. Admitted to medicine for further treatment, work-up.  Final Clinical Impression(s) / ED Diagnoses Final diagnoses:  Community acquired pneumonia, unspecified laterality  Hypoxia  Sepsis with acute hypoxic respiratory failure without septic shock, due to unspecified organism [facility])    Rx / DC Orders ED Discharge Orders    None       Ardeen Fillers, DO 03/06/21 0050    Benjiman Core, MD 03/06/21 [phone]

## 2021-03-05 NOTE — ED Notes (Signed)
Pt awake, A&Ox3, extremely hard of hearing. RA sat of 97%, but increased work of breathing noted and pt placed back on 2L [place].

## 2021-03-05 NOTE — ED Triage Notes (Addendum)
Pt arrived via [facility] for cc of low oxygen saturations increased fatigue from SNF. Facility reported oxygen saturation of 70% on RA (pt baseline). EMS placed pt on 2L [place] and pt remained 95% during transport, not in acute distress, but tachypea noted. Facility reported recent cough and congestion with fatigue. PMH of dementia, pt at baseline mental status.

## 2021-03-05 NOTE — Progress Notes (Signed)
Pharmacy Antibiotic Note  Gina Hodges is a 87 y.o. female admitted on 03/05/2021 with sepsis.  Pharmacy has been consulted for vancomycin and cefepime dosing.  Plan: Vancomycin 1500 mg IV x 1, then 750 mg IV every 24 hours (eAUC 449, Goal AUC 400-550, SCr 0.8 used) Cefepime 2g IV every 24 hours Monitor renal function, Cx and clinical progression to narrow Vancomycin levels as needed     Temp (24hrs), Avg:99.2 F (37.3 C), Min:99.2 F (37.3 C), Max:99.2 F (37.3 C)  Recent Labs  Lab 03/05/21 2003  WBC 11.9*  CREATININE 0.79    CrCl cannot be calculated (Unknown ideal weight.).    Allergies  Allergen Reactions  . Penicillins Other (See Comments)    "Allergic," per [facility] Did it involve swelling of the face/tongue/throat, SOB, or low BP? Unk Did it involve sudden or severe rash/hives, skin peeling, or any reaction on the inside of your mouth or nose? Unk Did you need to seek medical attention at a hospital or doctor's office? Unk When did it last happen? Unk If all above answers are "NO", may proceed with cephalosporin use.  . Bactrim [Sulfamethoxazole-Trimethoprim] Other (See Comments)    "Allergic," per [facility]  . Sulfa Antibiotics Other (See Comments)    "Allergic," per [facility]  . Codeine Palpitations and Other (See Comments)    "Allergic," per [facility]    Daylene Posey, PharmD Clinical Pharmacist ED Pharmacist Phone # [phone] 03/05/2021 9:42 PM

## 2021-03-06 DIAGNOSIS — Z880 Allergy status to penicillin: Secondary | ICD-10-CM | POA: Diagnosis not present

## 2021-03-06 DIAGNOSIS — Z885 Allergy status to narcotic agent status: Secondary | ICD-10-CM | POA: Diagnosis not present

## 2021-03-06 DIAGNOSIS — R296 Repeated falls: Secondary | ICD-10-CM | POA: Diagnosis present

## 2021-03-06 DIAGNOSIS — G4730 Sleep apnea, unspecified: Secondary | ICD-10-CM | POA: Diagnosis present

## 2021-03-06 DIAGNOSIS — Z79890 Hormone replacement therapy (postmenopausal): Secondary | ICD-10-CM | POA: Diagnosis not present

## 2021-03-06 DIAGNOSIS — J159 Unspecified bacterial pneumonia: Secondary | ICD-10-CM | POA: Diagnosis present

## 2021-03-06 DIAGNOSIS — R0902 Hypoxemia: Secondary | ICD-10-CM | POA: Diagnosis present

## 2021-03-06 DIAGNOSIS — J189 Pneumonia, unspecified organism: Secondary | ICD-10-CM | POA: Diagnosis not present

## 2021-03-06 DIAGNOSIS — F419 Anxiety disorder, unspecified: Secondary | ICD-10-CM | POA: Diagnosis present

## 2021-03-06 DIAGNOSIS — Z791 Long term (current) use of non-steroidal anti-inflammatories (NSAID): Secondary | ICD-10-CM | POA: Diagnosis not present

## 2021-03-06 DIAGNOSIS — Z66 Do not resuscitate: Secondary | ICD-10-CM | POA: Diagnosis present

## 2021-03-06 DIAGNOSIS — Z20822 Contact with and (suspected) exposure to covid-19: Secondary | ICD-10-CM | POA: Diagnosis present

## 2021-03-06 DIAGNOSIS — Z79899 Other long term (current) drug therapy: Secondary | ICD-10-CM | POA: Diagnosis not present

## 2021-03-06 DIAGNOSIS — M810 Age-related osteoporosis without current pathological fracture: Secondary | ICD-10-CM | POA: Diagnosis present

## 2021-03-06 DIAGNOSIS — Z8673 Personal history of transient ischemic attack (TIA), and cerebral infarction without residual deficits: Secondary | ICD-10-CM | POA: Diagnosis not present

## 2021-03-06 DIAGNOSIS — R6520 Severe sepsis without septic shock: Secondary | ICD-10-CM | POA: Diagnosis not present

## 2021-03-06 DIAGNOSIS — A419 Sepsis, unspecified organism: Secondary | ICD-10-CM | POA: Diagnosis present

## 2021-03-06 DIAGNOSIS — Z881 Allergy status to other antibiotic agents status: Secondary | ICD-10-CM | POA: Diagnosis not present

## 2021-03-06 DIAGNOSIS — J9601 Acute respiratory failure with hypoxia: Secondary | ICD-10-CM | POA: Diagnosis present

## 2021-03-06 DIAGNOSIS — F0390 Unspecified dementia without behavioral disturbance: Secondary | ICD-10-CM | POA: Diagnosis present

## 2021-03-06 DIAGNOSIS — I4821 Permanent atrial fibrillation: Secondary | ICD-10-CM | POA: Diagnosis present

## 2021-03-06 DIAGNOSIS — E039 Hypothyroidism, unspecified: Secondary | ICD-10-CM | POA: Diagnosis present

## 2021-03-06 DIAGNOSIS — F32A Depression, unspecified: Secondary | ICD-10-CM | POA: Diagnosis present

## 2021-03-06 DIAGNOSIS — E785 Hyperlipidemia, unspecified: Secondary | ICD-10-CM | POA: Diagnosis present

## 2021-03-06 DIAGNOSIS — J690 Pneumonitis due to inhalation of food and vomit: Secondary | ICD-10-CM | POA: Diagnosis present

## 2021-03-06 DIAGNOSIS — G4700 Insomnia, unspecified: Secondary | ICD-10-CM | POA: Diagnosis present

## 2021-03-06 LAB — PHOSPHORUS: Phosphorus: 2.8 mg/dL (ref 2.5–4.6)

## 2021-03-06 LAB — BASIC METABOLIC PANEL
Anion gap: 9 (ref 5–15)
BUN: 11 mg/dL (ref 8–23)
CO2: 24 mmol/L (ref 22–32)
Calcium: 8.5 mg/dL — ABNORMAL LOW (ref 8.9–10.3)
Chloride: 102 mmol/L (ref 98–111)
Creatinine, Ser: 0.75 mg/dL (ref 0.44–1.00)
GFR, Estimated: >60 mL/min (ref >60)
Glucose, Bld: 115 mg/dL — ABNORMAL HIGH (ref 70–99)
Potassium: 3.6 mmol/L (ref 3.5–5.1)
Sodium: 135 mmol/L (ref 135–145)

## 2021-03-06 LAB — URINALYSIS, ROUTINE W REFLEX MICROSCOPIC
Bilirubin Urine: NEGATIVE
Glucose, UA: NEGATIVE mg/dL
Hgb urine dipstick: NEGATIVE
Ketones, ur: NEGATIVE mg/dL
Nitrite: NEGATIVE
Protein, ur: NEGATIVE mg/dL
Specific Gravity, Urine: 1.028 (ref 1.005–1.030)
pH: 7 (ref 5.0–8.0)

## 2021-03-06 LAB — CBC
HCT: 32.7 % — ABNORMAL LOW (ref 36.0–46.0)
Hemoglobin: 10.1 g/dL — ABNORMAL LOW (ref 12.0–15.0)
MCH: 29.4 pg (ref 26.0–34.0)
MCHC: 30.9 g/dL (ref 30.0–36.0)
MCV: 95.3 fL (ref 80.0–100.0)
Platelets: 294 10*3/uL (ref 150–400)
RBC: 3.43 MIL/uL — ABNORMAL LOW (ref 3.87–5.11)
RDW: 14.1 % (ref 11.5–15.5)
WBC: 10.8 10*3/uL — ABNORMAL HIGH (ref 4.0–10.5)
nRBC: 0 % (ref 0.0–0.2)

## 2021-03-06 LAB — SARS CORONAVIRUS 2 (TAT 6-24 HRS): SARS Coronavirus 2: NEGATIVE

## 2021-03-06 LAB — RESP PANEL BY RT-PCR (FLU A&B, COVID) ARPGX2
Influenza A by PCR: NEGATIVE
Influenza B by PCR: NEGATIVE
SARS Coronavirus 2 by RT PCR: NEGATIVE

## 2021-03-06 LAB — MAGNESIUM: Magnesium: 1.8 mg/dL (ref 1.7–2.4)

## 2021-03-06 LAB — PROCALCITONIN: Procalcitonin: <0.1 ng/mL

## 2021-03-06 MED ORDER — PANTOPRAZOLE SODIUM 40 MG PO TBEC
40.0000 mg | DELAYED_RELEASE_TABLET | Freq: Every day | ORAL | Status: DC
  Administered 2021-03-06 – 2021-03-09 (×4): 40 mg via ORAL
  Filled 2021-03-06 (×4): qty 1

## 2021-03-06 MED ORDER — LEVOTHYROXINE SODIUM 75 MCG PO TABS
75.0000 ug | ORAL_TABLET | Freq: Every day | ORAL | Status: DC
  Administered 2021-03-06 – 2021-03-09 (×4): 75 ug via ORAL
  Filled 2021-03-06 (×4): qty 1

## 2021-03-06 MED ORDER — METRONIDAZOLE 500 MG/100ML IV SOLN
500.0000 mg | Freq: Three times a day (TID) | INTRAVENOUS | Status: DC
  Administered 2021-03-06 – 2021-03-09 (×10): 500 mg via INTRAVENOUS
  Filled 2021-03-06 (×10): qty 100

## 2021-03-06 MED ORDER — IPRATROPIUM-ALBUTEROL 0.5-2.5 (3) MG/3ML IN SOLN
3.0000 mL | Freq: Three times a day (TID) | RESPIRATORY_TRACT | Status: DC
  Administered 2021-03-06 – 2021-03-07 (×3): 3 mL via RESPIRATORY_TRACT
  Filled 2021-03-06 (×3): qty 3

## 2021-03-06 MED ORDER — MIRTAZAPINE 15 MG PO TABS
7.5000 mg | ORAL_TABLET | Freq: Every day | ORAL | Status: DC
  Administered 2021-03-06 – 2021-03-08 (×3): 7.5 mg via ORAL
  Filled 2021-03-06 (×4): qty 1

## 2021-03-06 MED ORDER — ALPRAZOLAM 0.25 MG PO TABS
0.2500 mg | ORAL_TABLET | Freq: Two times a day (BID) | ORAL | Status: DC | PRN
  Administered 2021-03-07 (×2): 0.25 mg via ORAL
  Filled 2021-03-06 (×2): qty 1

## 2021-03-06 MED ORDER — BENZONATATE 100 MG PO CAPS: 100.0000 mg | ORAL_CAPSULE | Freq: Three times a day (TID) | ORAL | Status: DC | PRN

## 2021-03-06 MED ORDER — VENLAFAXINE HCL ER 75 MG PO CP24
75.0000 mg | ORAL_CAPSULE | Freq: Every day | ORAL | Status: DC
  Administered 2021-03-06 – 2021-03-09 (×4): 75 mg via ORAL
  Filled 2021-03-06 (×4): qty 1

## 2021-03-06 MED ORDER — ENOXAPARIN SODIUM 40 MG/0.4ML IJ SOSY
40.0000 mg | PREFILLED_SYRINGE | Freq: Every day | INTRAMUSCULAR | Status: DC
  Administered 2021-03-06 – 2021-03-09 (×4): 40 mg via SUBCUTANEOUS
  Filled 2021-03-06 (×4): qty 0.4

## 2021-03-06 MED ORDER — METOPROLOL TARTRATE 5 MG/5ML IV SOLN: 2.5000 mg | Freq: Four times a day (QID) | INTRAVENOUS | Status: DC | PRN

## 2021-03-06 MED ORDER — DEXTROSE IN LACTATED RINGERS 5 % IV SOLN: INTRAVENOUS | Status: DC

## 2021-03-06 MED ORDER — ATORVASTATIN CALCIUM 10 MG PO TABS
5.0000 mg | ORAL_TABLET | Freq: Every day | ORAL | Status: DC
  Administered 2021-03-06 – 2021-03-08 (×3): 5 mg via ORAL
  Filled 2021-03-06 (×3): qty 1

## 2021-03-06 MED ORDER — ALBUTEROL SULFATE (2.5 MG/3ML) 0.083% IN NEBU: 2.5000 mg | INHALATION_SOLUTION | RESPIRATORY_TRACT | Status: DC | PRN

## 2021-03-06 MED ORDER — ALBUTEROL SULFATE (2.5 MG/3ML) 0.083% IN NEBU
2.5000 mg | INHALATION_SOLUTION | RESPIRATORY_TRACT | Status: DC | PRN
  Administered 2021-03-07 – 2021-03-09 (×2): 2.5 mg via RESPIRATORY_TRACT
  Filled 2021-03-06 (×2): qty 3

## 2021-03-06 MED ORDER — MELATONIN 5 MG PO TABS
10.0000 mg | ORAL_TABLET | Freq: Every day | ORAL | Status: DC
  Administered 2021-03-06 – 2021-03-08 (×3): 10 mg via ORAL
  Filled 2021-03-06 (×4): qty 2

## 2021-03-06 MED ORDER — GUAIFENESIN ER 600 MG PO TB12
600.0000 mg | ORAL_TABLET | Freq: Two times a day (BID) | ORAL | Status: DC
  Administered 2021-03-06 – 2021-03-09 (×7): 600 mg via ORAL
  Filled 2021-03-06 (×7): qty 1

## 2021-03-06 NOTE — H&P (Signed)
History and Physical  Gina Hodges ZOX:[id number] DOB: 1927/02/01 DOA: 03/05/2021  Referring physician: Dr. Wynona Neat PCP: Patient, No Pcp Per (Inactive)  Outpatient Specialists: Cardiology. Patient coming from: SNF.  Chief Complaint: Shortness of breath.  HPI: Gina Hodges is a 87 y.o. female with medical history significant for permanent atrial fibrillation not on oral anticoagulation due to falls, CVA, prior syncope in 2021, dementia, who presented to [facility] ED from SNF due to worsening shortness of breath of 2-3 days duration.  Unable to obtain any history from the patient due to dementia.  History is mainly obtained from EDP and from review of medical records.  Was found to be severely hypoxic with O2 saturation in the 60s on room air.  Not on oxygen supplementation at baseline.  She was brought into the ED for further evaluation and management of symptomatology.  Work-up in the ED revealed sepsis secondary to aspiration pneumonia.  Was started on IV antibiotics empirically.  TRH, hospitalist team, was asked to admit.  ED Course: T-max 99.2, BP 151/75, pulse 107, respiratory 26, O2 saturation 100% on 2 L on the monitor in the room.  Lab studies remarkable for serum sodium 133, potassium 4.0, BUN 16, creatinine 0.79, troponin 9 from 36.  WBC 11.9, hemoglobin 10.5, MCV 94, platelet 300.  Urine analysis positive for pyuria.  Review of Systems: Review of systems as noted in the HPI. All other systems reviewed and are negative.   Past Medical History:  Diagnosis Date  . Anemia   . Anxiety   . Cerebral infarct (HCC)   . Dementia (HCC)   . Dementia with behavioral problem (HCC)   . GERD (gastroesophageal reflux disease)   . Hyperlipidemia   . Osteoporosis   . PAF (paroxysmal atrial fibrillation) (HCC)   . Stroke (HCC)   . Thyroid disease   . Ulcerative esophagitis   . UTI (urinary tract infection)   . Vertigo    Past Surgical History:  Procedure Laterality Date  . ABDOMINAL HYSTERECTOMY     . APPENDECTOMY    . CHOLECYSTECTOMY      Social History:  reports that she has never smoked. She has never used smokeless tobacco. She reports that she does not drink alcohol and does not use drugs.   Allergies  Allergen Reactions  . Penicillins Other (See Comments)    "Allergic," per [facility] Did it involve swelling of the face/tongue/throat, SOB, or low BP? Unk Did it involve sudden or severe rash/hives, skin peeling, or any reaction on the inside of your mouth or nose? Unk Did you need to seek medical attention at a hospital or doctor's office? Unk When did it last happen? Unk If all above answers are "NO", may proceed with cephalosporin use.  . Bactrim [Sulfamethoxazole-Trimethoprim] Other (See Comments)    "Allergic," per [facility]  . Sulfa Antibiotics Other (See Comments)    "Allergic," per [facility]  . Codeine Palpitations and Other (See Comments)    "Allergic," per [facility]    Family History  Problem Relation Age of Onset  . Depression Mother   . Bipolar disorder Daughter   . Drug abuse Son       Prior to Admission medications   Medication Sig Start Date End Date Taking? Authorizing Provider  acetaminophen (TYLENOL) 650 MG CR tablet Take 650 mg by mouth 3 (three) times daily.   Yes [provider]  ALPRAZolam (XANAX) 0.5 MG tablet Take 1 tablet (0.5 mg total) by mouth 2 (two) times daily. 11/13/19  Yes  Ghimire, Werner Lean, MD  alum & mag hydroxide-simeth (MAALOX/MYLANTA) 200-200-20 MG/5ML suspension Take 30 mLs by mouth every 8 (eight) hours as needed for indigestion or heartburn.    Yes [provider]  atorvastatin (LIPITOR) 10 MG tablet Take 5 mg by mouth at bedtime.  02/27/13  Yes [provider]  Calcium Carbonate-Vitamin D 600-400 MG-UNIT tablet Take 1 tablet by mouth 2 (two) times daily.    Yes [provider]  levothyroxine (SYNTHROID, LEVOTHROID) 75 MCG tablet Take 75 mcg by mouth daily before breakfast.  02/27/13  Yes [provider]   Lidocaine 4 % PTCH Apply 1 patch topically See admin instructions. Apply 1 patch to affected area(s) two times a day and remove, per schedule   Yes [provider]  loperamide (IMODIUM) 2 MG capsule Take 2-4 mg by mouth See admin instructions. Take 2 capsules (4 mg) by mouth at first loose stool, then take 1 capsule (2 mg) every 2 hours as needed for diarrhea/loose stools 11/08/18  Yes [provider]  loratadine (CLARITIN) 10 MG tablet Take 10 mg by mouth daily.   Yes [provider]  Melatonin 10 MG TABS Take 10 mg by mouth at bedtime.    Yes [provider]  meloxicam (MOBIC) 15 MG tablet Take 15 mg by mouth daily. 10/30/18  Yes [provider]  mirtazapine (REMERON) 15 MG tablet Take 15 mg by mouth at bedtime. 03/03/21  Yes [provider]  Multiple Vitamin (MULTIVITAMIN WITH MINERALS) TABS tablet Take 1 tablet by mouth daily.    Yes [provider]  omeprazole (PRILOSEC) 20 MG capsule Take 20 mg by mouth 2 (two) times daily. 10/17/18  Yes [provider]  polyethylene glycol (MIRALAX / GLYCOLAX) packet Take 17 g by mouth daily as needed for mild constipation (MIX AND DRINK).    Yes [provider]  senna (SENOKOT) 8.6 MG TABS tablet Take 1 tablet by mouth daily.   Yes [provider]  traZODone (DESYREL) 150 MG tablet Take 75 mg by mouth at bedtime. 10/27/18  Yes [provider]  venlafaxine XR (EFFEXOR-XR) 75 MG 24 hr capsule Take 75 mg by mouth daily with breakfast.   Yes [provider]    Physical Exam: BP 114/62   Pulse 86   Temp 99.1 F (37.3 C) (Rectal)   Resp (!) 23   Ht 5\' 1"  (1.549 m)   Wt 65.8 kg   SpO2 99%   BMI 27.40 kg/m   . General: 87 y.o. year-old female well developed well nourished in no acute distress.  Alert and pleasantly demented. . Cardiovascular: Irregular rate and rhythm with no rubs or gallops.  No thyromegaly or JVD noted.  No lower extremity edema. 2/4  pulses in all 4 extremities. 85 Respiratory: Diffuse rales bilaterally.  No wheezing noted.  Poor inspiratory effort. . Abdomen: Soft nontender nondistended with normal bowel sounds x4 quadrants. . Muskuloskeletal: No cyanosis, clubbing or edema noted bilaterally . Neuro: CN II-XII intact, strength, sensation, reflexes . Skin: No ulcerative lesions noted or rashes . Psychiatry: Judgement and insight appear altered. Mood is appropriate for condition and setting          Labs on Admission:  Basic Metabolic Panel: Recent Labs  Lab 03/05/21 2003 03/05/21 2057  NA 133* 133*  K 3.6 4.0  CL 101  --   CO2 24  --   GLUCOSE 109*  --   BUN 16  --   CREATININE 0.79  --  CALCIUM 8.1*  --    Liver Function Tests: Recent Labs  Lab 03/05/21 2003  AST 17  ALT 5  ALKPHOS 75  BILITOT 0.6  PROT 5.6*  ALBUMIN 2.7*   No results for input(s): LIPASE, AMYLASE in the last 168 hours. No results for input(s): AMMONIA in the last 168 hours. CBC: Recent Labs  Lab 03/05/21 2003 03/05/21 2057  WBC 11.9*  --   NEUTROABS 7.6  --   HGB 10.0* 10.5*  HCT 31.5* 31.0*  MCV 94.0  --   PLT 300  --    Cardiac Enzymes: No results for input(s): CKTOTAL, CKMB, CKMBINDEX, TROPONINI in the last 168 hours.  BNP (last 3 results) Recent Labs    07/15/20 1505  BNP 141.6*    ProBNP (last 3 results) No results for input(s): PROBNP in the last 8760 hours.  CBG: No results for input(s): GLUCAP in the last 168 hours.  Radiological Exams on Admission: CT Angio Chest PE W and/or Wo Contrast  Result Date: 03/05/2021 CLINICAL DATA:  PE suspected, high prob Hypoxia and increased fatigue. EXAM: CT ANGIOGRAPHY CHEST WITH CONTRAST TECHNIQUE: Multidetector CT imaging of the chest was performed using the standard protocol during bolus administration of intravenous contrast. Multiplanar CT image reconstructions and MIPs were obtained to evaluate the vascular anatomy. CONTRAST:  26mL OMNIPAQUE IOHEXOL 350 MG/ML  SOLN COMPARISON:  Radiograph earlier today.  Chest CTA 11/17/2018 FINDINGS: Cardiovascular: There are no filling defects within the pulmonary arteries to suggest pulmonary embolus. The thoracic aorta is tortuous with atherosclerosis. However right subclavian artery courses posterior to the esophagus, variant anatomy. Heart is normal in size. There are coronary artery calcifications. No pericardial effusion. Mediastinum/Nodes: Large hiatal hernia with majority of the stomach being intrathoracic. No esophageal wall thickening. No thyroid nodule. No mediastinal adenopathy. Small mediastinal lymph nodes are all subcentimeter short axis. Lungs/Pleura: Moderate bronchial thickening with areas of bronchial filling and mucoid impaction involving the right greater than left upper lobe, and right greater than left lower lobe. Perifissural opacity in the dependent right greater than left upper lobes. Dependent right lower lobe opacity with small pleural effusion. Compressive atelectasis in the left lower lobe adjacent to hiatal hernia. Minimal patchy and nodular right middle lobe opacity. Upper Abdomen: No acute findings in the included upper abdomen. Musculoskeletal: Multiple chronic thoracic vertebral compression fractures, with augmentation of T8 and T12. Similar additional compression fractures of T3, T6, and T11. No new compression fracture or acute osseous abnormality. Review of the MIP images confirms the above findings. IMPRESSION: 1. No pulmonary embolus. 2. Moderate bronchial thickening with areas of bronchial filling and mucoid impaction involving the right greater than left upper lobe, and right greater than left lower lobe. Findings may be due to bronchitis or aspiration. 3. Dependent airspace opacities in the upper lobes and right lower lobe with small right effusion. Pneumonia versus aspiration. Minimal patchy and nodular right middle lobe opacity is likely infectious. 4. Large hiatal hernia with majority of the  stomach being intrathoracic. 5. Multiple chronic thoracic vertebral compression fractures. Aortic Atherosclerosis (ICD10-I70.0). Electronically Signed   By: Narda Rutherford M.D.   On: 03/05/2021 23:13   DG Chest Port 1 View  Result Date: 03/05/2021 CLINICAL DATA:  Hypoxia and possible sepsis.  Tachypnea. EXAM: PORTABLE CHEST 1 VIEW COMPARISON:  07/15/2020 FINDINGS: Atherosclerotic calcification of the aortic arch. Retrocardiac density corresponds in location to the location of a known moderate-sized hiatal hernia and accordingly may simply represent fluid-filled stomach and adjacent passive atelectasis.  No blunting of the costophrenic angles. Thoracic spondylosis.  Prior lower thoracic vertebral augmentation. Degenerative glenohumeral arthropathy on the left. IMPRESSION: 1. Retrocardiac opacity probably represents fluid-filled hiatal hernia and adjacent passive atelectasis. 2.  Aortic Atherosclerosis (ICD10-I70.0). 3. Thoracic spondylosis. Electronically Signed   By: Gaylyn RongWalter  Liebkemann M.D.   On: 03/05/2021 20:28    EKG: I independently viewed the EKG done and my findings are as followed: A. fib with rate of 103, nonspecific ST-T changes.  QTc 414.  Assessment/Plan Present on Admission: . Multifocal pneumonia  Active Problems:   Multifocal pneumonia  Sepsis secondary to multifocal pneumonia with concern for aspiration seen on CT scan, presumptive UTI. Presented with tachycardia, tachypnea and evidence of pneumonia on CT scan, pyuria on urinalysis. Started on cefepime and IV vancomycin in the ED, continue Add IV Flagyl. Follow cultures Monitor fever curve and WBC. Obtain procalcitonin level Speech therapist for swallow evaluation.  Presumptive UTI Follow urine culture for ID and sensitivities. Continue antibiotics, cefepime  Acute hypoxic respiratory failure secondary to multifocal pneumonia with concern for aspiration Not on oxygen supplementation at baseline Currently on 2 L with O2  saturation of 100%. Wean off oxygen supplementation as tolerated. Aspiration precautions.  Permanent A. fib, not on oral anticoagulation due to falls Not on rate control agent Continue to monitor for now IV Lopressor as needed with parameters.  Hypothyroidism Resume home levothyroxine.  Chronic anxiety/depression/insomnia Resume home Xanax, Effexor XR, Remeron  GERD Resume home PPI  Hyperlipidemia Resume home Lipitor  Physical debility PT OT to assess Fall precautions.    DVT prophylaxis: Subcu Lovenox daily  Code Status: Full code, patient did not have a DNR form in the ER room.  Patient has dementia.  Please contact family in the morning to update CODE STATUS.  Family Communication: None at bedside.  Disposition Plan: Admitted to progressive unit.  Consults called: None.  Admission status: Inpatient status.  Patient will require at least 2 midnights for further evaluation and treatment of present condition.   Status is: Inpatient    Dispo:  Patient From: Skilled Nursing Facility  Planned Disposition: Skilled Nursing Facility possibly home 03/08/2021 or when hemodynamically stable.  Medically stable for discharge: No         Darlin Droparole N Tehani Mersman MD [facility] Pager [phone]  If 7PM-7AM, please contact night-coverage [email or web address] Password [email or web address]  03/06/2021, 3:40 AM

## 2021-03-06 NOTE — Progress Notes (Signed)
Patient admitted earlier this morning by Dr. Margo Aye.  Please see H&P for further details.  Subjective: Pleasantly confused-awake-not in any distress.  On 2 L of oxygen.  Objective: Blood pressure 124/69, pulse 85, temperature 99.1 F (37.3 C), temperature source Rectal, resp. rate 20, height 5\' 1"  (1.549 m), weight 65.8 kg, SpO2 97 %.  Labs: Reviewed COVID PCR: Negative Blood culture/urine culture: Pending  CTA chest: No PE-findings suspicious for aspiration pneumonia  Assessment/plan: Acute hypoxic respiratory failure due to aspiration pneumonia: Hypoxia improving-now on just 2 L of oxygen-continue cefepime/Flagyl pending culture data.  Suspect can discontinue vancomycin.  Dementia: Pleasantly confused-at risk of delirium during this hospital stay  Await PT/OT/SLP eval  Spoke with patient's nephew-William Coleman over the phone this morning.  DNR status confirmed.  Rest of plan as outlined in H&P by Dr.  No charge note

## 2021-03-06 NOTE — Progress Notes (Signed)
PT Cancellation Note  Patient Details Name: Gina Hodges MRN: [id number] DOB: 1927-06-30   Cancelled Treatment:    Reason Eval/Treat Not Completed: Patient declined, no reason specified. Will re-attempt tomorrow as appropriate.    Gina Hodges 03/06/2021, 2:43 PM

## 2021-03-07 ENCOUNTER — Inpatient Hospital Stay (HOSPITAL_COMMUNITY): Payer: Medicare HMO

## 2021-03-07 DIAGNOSIS — J9601 Acute respiratory failure with hypoxia: Secondary | ICD-10-CM

## 2021-03-07 DIAGNOSIS — R6520 Severe sepsis without septic shock: Secondary | ICD-10-CM

## 2021-03-07 DIAGNOSIS — A419 Sepsis, unspecified organism: Principal | ICD-10-CM

## 2021-03-07 LAB — CBC
HCT: 31.7 % — ABNORMAL LOW (ref 36.0–46.0)
Hemoglobin: 9.9 g/dL — ABNORMAL LOW (ref 12.0–15.0)
MCH: 29.2 pg (ref 26.0–34.0)
MCHC: 31.2 g/dL (ref 30.0–36.0)
MCV: 93.5 fL (ref 80.0–100.0)
Platelets: 310 10*3/uL (ref 150–400)
RBC: 3.39 MIL/uL — ABNORMAL LOW (ref 3.87–5.11)
RDW: 14 % (ref 11.5–15.5)
WBC: 8.8 10*3/uL (ref 4.0–10.5)
nRBC: 0 % (ref 0.0–0.2)

## 2021-03-07 LAB — BASIC METABOLIC PANEL
Anion gap: 12 (ref 5–15)
BUN: 8 mg/dL (ref 8–23)
CO2: 19 mmol/L — ABNORMAL LOW (ref 22–32)
Calcium: 8.5 mg/dL — ABNORMAL LOW (ref 8.9–10.3)
Chloride: 103 mmol/L (ref 98–111)
Creatinine, Ser: 0.58 mg/dL (ref 0.44–1.00)
GFR, Estimated: >60 mL/min (ref >60)
Glucose, Bld: 119 mg/dL — ABNORMAL HIGH (ref 70–99)
Potassium: 3.8 mmol/L (ref 3.5–5.1)
Sodium: 134 mmol/L — ABNORMAL LOW (ref 135–145)

## 2021-03-07 LAB — GLUCOSE, CAPILLARY
Glucose-Capillary: 127 mg/dL — ABNORMAL HIGH (ref 70–99)
Glucose-Capillary: 111 mg/dL — ABNORMAL HIGH (ref 70–99)

## 2021-03-07 MED ORDER — SODIUM CHLORIDE 0.9 % IV SOLN: INTRAVENOUS | Status: AC

## 2021-03-07 NOTE — Progress Notes (Signed)
  Speech Language Pathology Treatment: Dysphagia  Patient Details Name: Gina Hodges MRN: [id number] DOB: 02/09/1927 Today's Date: 03/07/2021 Time: 4193-7902 SLP Time Calculation (min) (ACUTE ONLY): 17 min  Assessment / Plan / Recommendation Clinical Impression  BSE ordered received and acknowledged.  BSE was completed yesterday with no known acute change, therefore patient was seen for diagnostic treatment on this date.  Pt was encountered awake/alert and was agreeable to tx session.  RN reported that pt refused crushed meds in puree overnight, so she was trialed with whole meds with water with PM nursing staff and they reported that she had difficulty.  Pt was seen with trials of thin liquid, nectar-thick liquid, honey-thick liquid, and puree on this date.  She exhibited immediate coughing with thin liquid and immediate throat clearing with nectar-thick liquid.  No immediate throat clearing or coughing with honey-thick liquid or puree; however, delayed cough was observed approximately 20-30 seconds after HTL and puree trials.  Possible esophageal etiology; however, unable to r/o pharyngeal dysphagia with those consistencies.  Multiple swallows per bolus were also noted with water and NTL, concerning for pharyngeal residue vs esophageal dysphagia.  Given clinical s/sx of aspiration with all consistencies, recommend a modified barium swallow study to further evaluate swallow function.  Spoke with radiology following this session and MBS is unable to be completed this AM.  Will check back this afternoon.  Will plan to complete tomorrow if unable to do so this afternoon.  Recommend continuation of NPO with essential PO meds crushed in puree and small sips of honey-thick liquid PRN for comfort with full RN supervision until MBS is completed.      HPI HPI: Pt is a 87 yo female presenting with 2-3 days of SOB and admitted with sepsis secondary to PNA. CTA Chest showed moderate bronchial thickening with areas of  bronchial filling and mucoid impaction that may be due to bronchitis or aspiration. Additional opacities are concerning for PNA vs aspiration and pt had a large HH with the majority of the stomach being intrathoracic. PMH also includes: dementia, afib, CVA, GERD, anxiety, thyroid disase, ulcerative esophagitis. MBS in 11/2019 revealed sensed penetration to the cords with thin and nectar thick liquids. Pt did have instances of normal swallowing wtih nectar thick liquids and they were more spontaneously cleared, so a diet of Dys 1 solids and nectar thick liquids was initiated.      SLP Plan  MBS       Recommendations  Diet recommendations: NPO (essential PO meds crushed in puree.  Small sips of honey-thick liquid PNR for comfort with RN supervision) Liquids provided via: Teaspoon;Straw Medication Administration: Via alternative means (Crush essential PO meds in puree) Supervision: Staff to assist with self feeding;Full supervision/cueing for compensatory strategies Compensations: Minimize environmental distractions;Slow rate;Small sips/bites Postural Changes and/or Swallow Maneuvers: Seated upright 90 degrees                Oral Care Recommendations: Oral care QID;Staff/trained caregiver to provide oral care Follow up Recommendations: Skilled Nursing facility SLP Visit Diagnosis: Dysphagia, unspecified (R13.10) Plan: MBS       GO               Villa Herb., M.S., CCC-SLP Acute Rehabilitation Services Office: [phone]  Shanon Rosser [facility] 03/07/2021, 9:03 AM

## 2021-03-07 NOTE — Progress Notes (Signed)
Bedside swallow evaluation completed on 03/06/21 by Ivar Drape, CCC-SLP.  Please see below.    03/06/21 1500  SLP Visit Information  SLP Received On 03/06/21  Subjective  Subjective pt alert, hard of hearing, denies trouble swallowing  Patient/Family Stated Goal none stated  General Information  HPI Pt is a 87 yo female presenting with 2-3 days of SOB and admitted with sepsis secondary to PNA. CTA Chest showed moderate bronchial thickening with areas of bronchial filling and mucoid impaction that may be due to bronchitis or aspiration. Additional opacities are concerning for PNA vs aspiration and pt had a large HH with the majority of the stomach being intrathoracic. PMH also includes: dementia, afib, CVA, GERD, anxiety, thyroid disase, ulcerative esophagitis. MBS in 11/2019 revealed sensed penetration to the cords with thin and nectar thick liquids. Pt did have instances of normal swallowing wtih nectar thick liquids and they were more spontaneously cleared, so a diet of Dys 1 solids and nectar thick liquids was initiated.  Type of Study Bedside Swallow Evaluation  Previous Swallow Assessment see HPI  Diet Prior to this Study NPO  Temperature Spikes Noted No  Respiratory Status Nasal cannula  History of Recent Intubation No  Behavior/Cognition Alert;Cooperative;Other (Comment) (HOH)  Oral Cavity Assessment WFL  Oral Care Completed by SLP No  Oral Cavity - Dentition Edentulous;Dentures, not available (pt says dentures are not at the hospital)  Vision Functional for self-feeding  Self-Feeding Abilities Able to feed self  Patient Positioning Upright in bed  Baseline Vocal Quality Normal  Pain Assessment  Pain Assessment Faces  Faces Pain Scale 0  Oral Assessment (Complete on admission/transfer/change in patient condition)  Does patient have any of the following "high(er) risk" factors? Nutritional status - fluids only or NPO for >24 hours  Patient is HIGH RISK: Non-ventilated Order set for  Adult Oral Care Protocol initiated - "High Risk Patients - Non-Ventilated" option selected  (see row information)  Oral Motor/Sensory Function  Overall Oral Motor/Sensory Function WFL  Ice Chips  Ice chips NT  Thin Liquid  Thin Liquid Impaired  Presentation Cup;Self Fed;Straw  Pharyngeal  Phase Impairments Cough - Immediate;Cough - Delayed  Nectar Thick Liquid  Nectar Thick Liquid Impaired  Presentation Cup;Self Fed  Pharyngeal Phase Impairments Cough - Immediate  Honey Thick Liquid  Honey Thick Liquid Impaired  Presentation Spoon  Pharyngeal Phase Impairments Cough - Delayed  Puree  Puree Impaired  Presentation Spoon  Pharyngeal Phase Impairments Cough - Immediate;Cough - Delayed  Solid  Solid NT  SLP - End of Session  Patient left in bed;with call bell/phone within reach  Nurse Communication Diet recommendation;Swallow strategies reviewed;Treatment plan  Suspected Esophageal Findings  Suspected Esophageal Findings Belching  SLP Assessment  Clinical Impression Statement (ACUTE ONLY) Pt with a h/o dysphagia presents with coughing and frequent eructation across all liquids tested. Coughing was not initially noted with purees, but as boluses progressed she started to exhibit prolonged coughing with c/o it "not going down." Pt denies any difficulties at baseline but also denies any coughing during trials today. Current presentation exhibits more s/s of potential aspiration compared to evaluation from 2021, and imaging is concerning for PNA and possible aspiration. Question an exacerbation of known pharyngeal and/or esophageal issues. Would hold PO trials for today with SLP f/u as infection is treated to see if she shows any clinical signs of improvement. MBS may likely be indicated again.  SLP Visit Diagnosis Dysphagia, unspecified (R13.10)  Impact on safety and function Moderate aspiration risk  Other Related Risk Factors History of dysphagia;History of pneumonia;History of GERD;History  of esophageal-related issues;Deconditioning;Decreased respiratory status  Swallow Evaluation Recommendations  SLP Diet Recommendations NPO;NPO except meds  Medication Administration Via alternative means (per RN most meds are via IV at the moment, but could crush essential meds in puree if needed)  Treatment Plan  Oral Care Recommendations Oral care QID  Treatment Recommendations Therapy as outlined in treatment plan below  Follow up Recommendations Skilled Nursing facility  Speech Therapy Frequency (ACUTE ONLY) min 2x/week  Treatment Duration 2 weeks  Interventions Aspiration precaution training;Compensatory techniques;Patient/family education;Trials of upgraded texture/liquids  Prognosis  Prognosis for Safe Diet Advancement Good  Barriers to Reach Goals Time post onset  Individuals Consulted  Consulted and Agree with Results and Recommendations Patient;RN  SLP Time Calculation  SLP Start Time (ACUTE ONLY) 1448  SLP Stop Time (ACUTE ONLY) 1507  SLP Time Calculation (min) (ACUTE ONLY) 19 min  SLP Evaluations  $ SLP Speech Visit 1 Visit  SLP Evaluations  $BSS Swallow 1 Procedure  $Swallowing Treatment 1 Procedure

## 2021-03-07 NOTE — Progress Notes (Signed)
[facility] PROGRESS NOTE    Progress Note  Gina Hodges  GXQ:[id number] DOB: [birth year] DOA: 03/05/2021 PCP: Patient, No Pcp Per (Inactive)     Brief Narrative:   Gina Hodges is an 87 y.o. female past medical history significant for permanent atrial fibrillation not on anticoagulation due to fall CVA prior syncope dementia comes into the ED for worsening shortness of breath for 3 days.  With some leukocytosis I am concerned about sepsis probably due to aspiration pneumonia.    Assessment/Plan:  Acute respiratory failure with hypoxia 2/2  Multifocal pneumonia: Requiring 2 L of oxygen to keep saturations greater 96%, will try to wean to room air. Has remained afebrile leukocytosis improved, she was started on admission and Flagyl and cefepime.   Urine cultures are pending. Speech evaluation is pending.  Sepsis present on admission possible due to bacterial pneumonia: Started on cefepime and Flagyl and fluid resuscitated sepsis physiology has resolved.  Permanent atrial fibrillation: Continue IV Lopressor as needed, now anticoagulation due to fall continue IV Lopressor.  Hypothyroidism Continue Synthroid.  Chronic anxiety/depression: Continue Xanax Effexor and Remeron.  Hyperlipidemia Continue home dose of Lipitor. Physical therapy has been consulted.   DVT prophylaxis: lovenox Family Communication:none Status is: Inpatient  Remains inpatient appropriate because:Hemodynamically unstable   Dispo:  Patient From: Skilled Nursing Facility  Planned Disposition: Skilled Nursing Facility  Medically stable for discharge: No          Code Status:     Code Status Orders  (From admission, onward)         Start     Ordered   03/06/21 0958  Do not attempt resuscitation (DNR)  Continuous       Question Answer Comment  In the event of cardiac or respiratory ARREST Do not call a "code blue"   In the event of cardiac or respiratory ARREST Do not perform  Intubation, CPR, defibrillation or ACLS   In the event of cardiac or respiratory ARREST Use medication by any route, position, wound care, and other measures to relive pain and suffering. May use oxygen, suction and manual treatment of airway obstruction as needed for comfort.      03/06/21 0958        Code Status History    Date Active Date Inactive Code Status Order ID Comments User Context   03/06/2021 0402 03/06/2021 0958 Full Code [id number]  Darlin Drop, DO ED   03/06/2021 0334 03/06/2021 0402 DNR [id number]  Darlin Drop, DO ED   11/10/2019 1830 11/14/2019 0308 DNR [id number]  Jackie Plum, MD Inpatient   08/13/2019 2110 08/15/2019 0023 DNR [id number]  Kirt Boys, MD ED   08/13/2019 2110 08/13/2019 2110 DNR [id number]  Angelita Ingles, MD ED   08/13/2019 2007 08/13/2019 2110 Full Code [id number]  Angelita Ingles, MD ED   11/17/2018 0041 11/19/2018 1828 Full Code [id number]  Rometta Emery, MD Inpatient   02/19/2016 1606 02/24/2016 1855 DNR [id number]  Raliegh Ip, DO Inpatient   Advance Care Planning Activity        IV Access:    Peripheral IV   Procedures and diagnostic studies:   CT Angio Chest PE W and/or Wo Contrast  Result Date: 03/05/2021 CLINICAL DATA:  PE suspected, high prob Hypoxia and increased fatigue. EXAM: CT ANGIOGRAPHY CHEST WITH CONTRAST TECHNIQUE: Multidetector CT imaging of the chest was performed using the standard protocol during bolus administration of intravenous contrast. Multiplanar CT image reconstructions and MIPs were obtained to evaluate the  vascular anatomy. CONTRAST:  55mL OMNIPAQUE IOHEXOL 350 MG/ML SOLN COMPARISON:  Radiograph earlier today.  Chest CTA 11/17/2018 FINDINGS: Cardiovascular: There are no filling defects within the pulmonary arteries to suggest pulmonary embolus. The thoracic aorta is tortuous with atherosclerosis. However right subclavian artery courses posterior to the esophagus, variant anatomy. Heart is normal in size.  There are coronary artery calcifications. No pericardial effusion. Mediastinum/Nodes: Large hiatal hernia with majority of the stomach being intrathoracic. No esophageal wall thickening. No thyroid nodule. No mediastinal adenopathy. Small mediastinal lymph nodes are all subcentimeter short axis. Lungs/Pleura: Moderate bronchial thickening with areas of bronchial filling and mucoid impaction involving the right greater than left upper lobe, and right greater than left lower lobe. Perifissural opacity in the dependent right greater than left upper lobes. Dependent right lower lobe opacity with small pleural effusion. Compressive atelectasis in the left lower lobe adjacent to hiatal hernia. Minimal patchy and nodular right middle lobe opacity. Upper Abdomen: No acute findings in the included upper abdomen. Musculoskeletal: Multiple chronic thoracic vertebral compression fractures, with augmentation of T8 and T12. Similar additional compression fractures of T3, T6, and T11. No new compression fracture or acute osseous abnormality. Review of the MIP images confirms the above findings. IMPRESSION: 1. No pulmonary embolus. 2. Moderate bronchial thickening with areas of bronchial filling and mucoid impaction involving the right greater than left upper lobe, and right greater than left lower lobe. Findings may be due to bronchitis or aspiration. 3. Dependent airspace opacities in the upper lobes and right lower lobe with small right effusion. Pneumonia versus aspiration. Minimal patchy and nodular right middle lobe opacity is likely infectious. 4. Large hiatal hernia with majority of the stomach being intrathoracic. 5. Multiple chronic thoracic vertebral compression fractures. Aortic Atherosclerosis (ICD10-I70.0). Electronically Signed   By: Narda Rutherford M.D.   On: 03/05/2021 23:13   DG Chest Port 1 View  Result Date: 03/05/2021 CLINICAL DATA:  Hypoxia and possible sepsis.  Tachypnea. EXAM: PORTABLE CHEST 1 VIEW  COMPARISON:  07/15/2020 FINDINGS: Atherosclerotic calcification of the aortic arch. Retrocardiac density corresponds in location to the location of a known moderate-sized hiatal hernia and accordingly may simply represent fluid-filled stomach and adjacent passive atelectasis. No blunting of the costophrenic angles. Thoracic spondylosis.  Prior lower thoracic vertebral augmentation. Degenerative glenohumeral arthropathy on the left. IMPRESSION: 1. Retrocardiac opacity probably represents fluid-filled hiatal hernia and adjacent passive atelectasis. 2.  Aortic Atherosclerosis (ICD10-I70.0). 3. Thoracic spondylosis. Electronically Signed   By: Gaylyn Rong M.D.   On: 03/05/2021 20:28     Medical Consultants:    None.   Subjective:    Gina Hodges sleepy this morning able to open her eyes at high  Objective:    Vitals:   03/06/21 1925 03/06/21 1936 03/07/21 0037 03/07/21 0400  BP:  99/86 97/78 114/63  Pulse:  95  94  Resp:  18 20 20   Temp:  98.8 F (37.1 C) 98.7 F (37.1 C) 97.7 F (36.5 C)  TempSrc:  Oral Oral Oral  SpO2: 91% 96% 97% 95%  Weight:   66.1 kg   Height:       SpO2: 95 % O2 Flow Rate (L/min): 2 L/min   Intake/Output Summary (Last 24 hours) at 03/07/2021 0708 Last data filed at 03/07/2021 0630 Gross per 24 hour  Intake 1188.97 ml  Output 700 ml  Net 488.97 ml   Filed Weights   03/05/21 2313 03/06/21 1400 03/07/21 0037  Weight: 65.8 kg 66.3 kg 66.1 kg  Exam: General exam: In no acute distress. Respiratory system: Good air movement and clear to auscultation. Cardiovascular system: S1 & S2 heard, RRR. No JVD. Gastrointestinal system: Abdomen is nondistended, soft and nontender.  Extremities: No pedal edema. Skin: No rashes, lesions or ulcers Psychiatry: Not oriented to time person and place.   Data Reviewed:    Labs: Basic Metabolic Panel: Recent Labs  Lab 03/05/21 2003 03/05/21 2057 03/06/21 0410 03/07/21 0305  NA 133* 133* 135 134*  K 3.6  4.0 3.6 3.8  CL 101  --  102 103  CO2 24  --  24 19*  GLUCOSE 109*  --  115* 119*  BUN 16  --  11 8  CREATININE 0.79  --  0.75 0.58  CALCIUM 8.1*  --  8.5* 8.5*  MG  --   --  1.8  --   PHOS  --   --  2.8  --    GFR Estimated Creatinine Clearance: 40.9 mL/min (by C-G formula based on SCr of 0.58 mg/dL). Liver Function Tests: Recent Labs  Lab 03/05/21 2003  AST 17  ALT 5  ALKPHOS 75  BILITOT 0.6  PROT 5.6*  ALBUMIN 2.7*   No results for input(s): LIPASE, AMYLASE in the last 168 hours. No results for input(s): AMMONIA in the last 168 hours. Coagulation profile No results for input(s): INR, PROTIME in the last 168 hours. COVID-19 Labs  No results for input(s): DDIMER, FERRITIN, LDH, CRP in the last 72 hours.  Lab Results  Component Value Date   SARSCOV2NAA NEGATIVE 03/05/2021   SARSCOV2NAA NEGATIVE 03/05/2021   SARSCOV2NAA NEGATIVE 11/12/2019   SARSCOV2NAA NEGATIVE 11/10/2019    CBC: Recent Labs  Lab 03/05/21 2003 03/05/21 2057 03/06/21 0410 03/07/21 0305  WBC 11.9*  --  10.8* 8.8  NEUTROABS 7.6  --   --   --   HGB 10.0* 10.5* 10.1* 9.9*  HCT 31.5* 31.0* 32.7* 31.7*  MCV 94.0  --  95.3 93.5  PLT 300  --  294 310   Cardiac Enzymes: No results for input(s): CKTOTAL, CKMB, CKMBINDEX, TROPONINI in the last 168 hours. BNP (last 3 results) No results for input(s): PROBNP in the last 8760 hours. CBG: Recent Labs  Lab 03/07/21 0620  GLUCAP 127*   D-Dimer: No results for input(s): DDIMER in the last 72 hours. Hgb A1c: No results for input(s): HGBA1C in the last 72 hours. Lipid Profile: No results for input(s): CHOL, HDL, LDLCALC, TRIG, CHOLHDL, LDLDIRECT in the last 72 hours. Thyroid function studies: No results for input(s): TSH, T4TOTAL, T3FREE, THYROIDAB in the last 72 hours.  Invalid input(s): FREET3 Anemia work up: No results for input(s): VITAMINB12, FOLATE, FERRITIN, TIBC, IRON, RETICCTPCT in the last 72 hours. Sepsis Labs: Recent Labs  Lab  03/05/21 2003 03/05/21 2203 03/06/21 0410 03/07/21 0305  PROCALCITON  --   --  <0.10  --   WBC 11.9*  --  10.8* 8.8  LATICACIDVEN 0.9 1.1  --   --    Microbiology Recent Results (from the past 240 hour(s))  SARS CORONAVIRUS 2 (TAT 6-24 HRS) Nasopharyngeal Nasopharyngeal Swab     Status: None   Collection Time: 03/05/21  7:48 PM   Specimen: Nasopharyngeal Swab  Result Value Ref Range Status   SARS Coronavirus 2 NEGATIVE NEGATIVE Final    Comment: (NOTE) SARS-CoV-2 target nucleic acids are NOT DETECTED.  The SARS-CoV-2 RNA is generally detectable in upper and lower respiratory specimens during the acute phase of infection. Negative results do  not preclude SARS-CoV-2 infection, do not rule out co-infections with other pathogens, and should not be used as the sole basis for treatment or other patient management decisions. Negative results must be combined with clinical observations, patient history, and epidemiological information. The expected result is Negative.  Fact Sheet for Patients: HairSlick.no  Fact Sheet for Healthcare Providers: quierodirigir.com  This test is not yet approved or cleared by the [place] FDA and  has been authorized for detection and/or diagnosis of SARS-CoV-2 by FDA under an Emergency Use Authorization (EUA). This EUA will remain  in effect (meaning this test can be used) for the duration of the COVID-19 declaration under Se ction 564(b)(1) of the Act, 21 U.S.C. section 360bbb-3(b)(1), unless the authorization is terminated or revoked sooner.  Performed at [facility], [address] [place], [place] [address]   Resp Panel by RT-PCR (Flu A&B, Covid) Nasopharyngeal Swab     Status: None   Collection Time: 03/05/21 11:32 PM   Specimen: Nasopharyngeal Swab; Nasopharyngeal(NP) swabs in vial transport medium  Result Value Ref Range Status   SARS Coronavirus 2 by RT PCR NEGATIVE  NEGATIVE Final    Comment: (NOTE) SARS-CoV-2 target nucleic acids are NOT DETECTED.  The SARS-CoV-2 RNA is generally detectable in upper respiratory specimens during the acute phase of infection. The lowest concentration of SARS-CoV-2 viral copies this assay can detect is 138 copies/mL. A negative result does not preclude SARS-Cov-2 infection and should not be used as the sole basis for treatment or other patient management decisions. A negative result may occur with  improper specimen collection/handling, submission of specimen other than nasopharyngeal swab, presence of viral mutation(s) within the areas targeted by this assay, and inadequate number of viral copies(<138 copies/mL). A negative result must be combined with clinical observations, patient history, and epidemiological information. The expected result is Negative.  Fact Sheet for Patients:  BloggerCourse.com  Fact Sheet for Healthcare Providers:  SeriousBroker.it  This test is no t yet approved or cleared by the [place] FDA and  has been authorized for detection and/or diagnosis of SARS-CoV-2 by FDA under an Emergency Use Authorization (EUA). This EUA will remain  in effect (meaning this test can be used) for the duration of the COVID-19 declaration under Section 564(b)(1) of the Act, 21 U.S.C.section 360bbb-3(b)(1), unless the authorization is terminated  or revoked sooner.       Influenza A by PCR NEGATIVE NEGATIVE Final   Influenza B by PCR NEGATIVE NEGATIVE Final    Comment: (NOTE) The Xpert Xpress SARS-CoV-2/FLU/RSV plus assay is intended as an aid in the diagnosis of influenza from Nasopharyngeal swab specimens and should not be used as a sole basis for treatment. Nasal washings and aspirates are unacceptable for Xpert Xpress SARS-CoV-2/FLU/RSV testing.  Fact Sheet for Patients: BloggerCourse.com  Fact Sheet for Healthcare  Providers: SeriousBroker.it  This test is not yet approved or cleared by the [place] FDA and has been authorized for detection and/or diagnosis of SARS-CoV-2 by FDA under an Emergency Use Authorization (EUA). This EUA will remain in effect (meaning this test can be used) for the duration of the COVID-19 declaration under Section 564(b)(1) of the Act, 21 U.S.C. section 360bbb-3(b)(1), unless the authorization is terminated or revoked.  Performed at [facility] [place] [facility], [address] [place], [place] [address]      Medications:   . atorvastatin  5 mg Oral QHS  . enoxaparin (LOVENOX) injection  40 mg Subcutaneous Daily  . guaiFENesin  600 mg Oral BID  . ipratropium-albuterol  3 mL Nebulization TID  . levothyroxine  75 mcg Oral QAC breakfast  . melatonin  10 mg Oral QHS  . mirtazapine  7.5 mg Oral QHS  . pantoprazole  40 mg Oral Daily  . venlafaxine XR  75 mg Oral Q breakfast   Continuous Infusions: . ceFEPime (MAXIPIME) IV 2 g (03/06/21 2104)  . dextrose 5% lactated ringers 50 mL/hr at 03/07/21 0515  . metronidazole 500 mg (03/07/21 0516)      LOS: 1 day   Marinda ElkAbraham Feliz Ortiz  [facility]  03/07/2021, 7:08 AM

## 2021-03-07 NOTE — Evaluation (Addendum)
Occupational Therapy Evaluation Patient Details Name: Gina Hodges MRN: [id number] DOB: 09-03-1927 Today's Date: 03/07/2021    History of Present Illness 87 y.o. female with medical history significant for permanent atrial fibrillation not on oral anticoagulation due to falls, CVA, prior syncope in 2021, dementia, who presented to [facility] ED from SNF due to worsening shortness of breath of 2-3 days duration.   Clinical Impression   Patient admitted for the diagnosis above.  PTA she is a long term resident at a local SNF.  She is assist to a wheelchair and Dep care for ADL except self feeding.  Plan is for a return to her SNF when cleared medically.  No significant changes to her current abilities compared to her SNF status.  No acute skilled OT needs.  Given prior level of function, and near total care, patient is appropriate for a return to her SNF.  Recommend OOB with staff via hoyer lift to recliner for meals.      Follow Up Recommendations  SNF    Equipment Recommendations  None recommended by OT    Recommendations for Other Services       Precautions / Restrictions Precautions Precautions: Fall Restrictions Weight Bearing Restrictions: No Other Position/Activity Restrictions: aspiration pneumonia      Mobility Bed Mobility Overal bed mobility: Needs Assistance Bed Mobility: Supine to Sit;Sit to Supine     Supine to sit: Max assist Sit to supine: Max assist     Patient Response: Cooperative  Transfers                 General transfer comment: not attempted    Balance Overall balance assessment: Needs assistance Sitting-balance support: Feet supported Sitting balance-Leahy Scale: Poor   Postural control: Posterior lean                                 ADL either performed or assessed with clinical judgement   ADL Overall ADL's : At baseline                                             Vision Patient Visual Report: No  change from baseline       Perception     Praxis      Pertinent Vitals/Pain Pain Assessment: Faces Faces Pain Scale: No hurt Pain Intervention(s): Monitored during session     Hand Dominance Right   Extremity/Trunk Assessment Upper Extremity Assessment Upper Extremity Assessment: Generalized weakness   Lower Extremity Assessment Lower Extremity Assessment: Defer to PT evaluation   Cervical / Trunk Assessment Cervical / Trunk Assessment: Kyphotic   Communication Communication Communication: HOH   Cognition Arousal/Alertness: Awake/alert Behavior During Therapy: WFL for tasks assessed/performed Overall Cognitive Status: History of cognitive impairments - at baseline                                                      Home Living Family/patient expects to be discharged to:: Skilled nursing facility   Available Help at Discharge: Skilled Nursing Facility;Available 24 hours/day Type of Home: Skilled Nursing Facility  Additional Comments: Patient continues to reside at [facility], sounds like hoyer to wheelchair, and assist for ADL care.  States she can feed herself.      Prior Functioning/Environment Level of Independence: Needs assistance  Gait / Transfers Assistance Needed: wheelchair at baseline, and probably +2 or hoyer lift. ADL's / Homemaking Assistance Needed: assist for all ADL except self feeding Communication / Swallowing Assistance Needed: WFL Comments: aspiration pneumonia        OT Problem List: Decreased cognition      OT Treatment/Interventions:      OT Goals(Current goals can be found in the care plan section) Acute Rehab OT Goals Patient Stated Goal: Plan appears to be back to SNF OT Goal Formulation: Patient unable to participate in goal setting Time For Goal Achievement: 03/07/21 Potential to Achieve Goals: Good  OT Frequency:     Barriers to D/C:  none noted           Co-evaluation              AM-PAC OT "6 Clicks" Daily Activity     Outcome Measure Help from another person eating meals?: A Little Help from another person taking care of personal grooming?: A Lot Help from another person toileting, which includes using toliet, bedpan, or urinal?: Total Help from another person bathing (including washing, rinsing, drying)?: Total Help from another person to put on and taking off regular upper body clothing?: Total Help from another person to put on and taking off regular lower body clothing?: Total 6 Click Score: 9   End of Session Nurse Communication: Other (comment) (patient needing bed lines changed)  Activity Tolerance: Patient tolerated treatment well Patient left: in bed;with call bell/phone within reach;with bed alarm set  OT Visit Diagnosis: Unsteadiness on feet (R26.81)                Time: 4818-5631 OT Time Calculation (min): 19 min Charges:  OT General Charges $OT Visit: 1 Visit OT Evaluation $OT Eval Moderate Complexity: 1 Mod  03/07/2021  Rich, OTR/L  Acute Rehabilitation Services  Office:  [phone]   Suzanna Obey 03/07/2021, 9:27 AM

## 2021-03-07 NOTE — Progress Notes (Signed)
Pt c/o shortness of breath, difficulty breathing and excessive coughing, expiratory wheezing noted on auscultation, albuterol neb given and was effective.

## 2021-03-07 NOTE — Progress Notes (Addendum)
Modified Barium Swallow Progress Note  Patient Details  Name: Gina Hodges MRN: [id number] Date of Birth: Nov 06, 1926  Today's Date: 03/07/2021  Modified Barium Swallow completed.  Full report located under Chart Review in the Imaging Section.  Brief recommendations include the following:  Clinical Impression  Pt was seen for a modified barium swallow study and she presents with moderate oropharyngeal dysphagia c/b silent aspiration of thin liquid and nectar-thick liquid (PAS 8).    Aspiration occurred before and during the swallow secondary to delayed swallow initiation at the level of the pyriform sinus and delayed laryngeal closure.  Pt was able to clear some of the aspirated material with a cued cough.  Aspiration mostly occurred with large sips of liquid; however, patient had difficulty following cues to take small sips.  Trace, shallow, transient laryngeal penetration (PAS 2) occurred x1 with both honey-thick liquid and puree, but it was not observed with additional trials of either consistency, and no aspiration occurred.  Oral phase was remarkable for decreased lingual control resulting in premature spillage to the pyriform sinus and decreased lingual strength resulting in trace oral residue.  Pharyngeal phase was remarkable for decreased hyolaryngeal excursion resulting in trace-mild vallecular and pyriform residue.  Pharyngoesophageal phase was unremarkable.    Recommend Dysphagia 1 (puree) solids and honey-thick liquids with medications administered crushed in puree.   Swallow Evaluation Recommendations       SLP Diet Recommendations: Dysphagia 1 (Puree) solids;Honey thick liquids   Liquid Administration via: Cup;Straw   Medication Administration: Crushed with puree   Supervision: Staff to assist with self feeding;Full supervision/cueing for compensatory strategies   Compensations: Minimize environmental distractions;Slow rate;Small sips/bites       Oral Care  Recommendations: Oral care BID   Other Recommendations: Order thickener from pharmacy;Prohibited food (jello, ice cream, thin soups);Remove water pitcher   Villa Herb M.S., CCC-SLP Acute Rehabilitation Services Office: [phone]  Shanon Rosser [facility] 03/07/2021,2:09 PM

## 2021-03-07 NOTE — Evaluation (Signed)
Physical Therapy Evaluation Patient Details Name: Gina Hodges MRN: [id number] DOB: Apr 30, 1927 Today's Date: 03/07/2021   History of Present Illness  Pt is a 87 y/o female presenting to ED from SNF on 03/05/2021 with worsening shortness. Work-up in the ED revealed sepsis secondary to aspiration pneumonia. PMH significant for permanent atrial fibrillation not on oral anticoagulation due to falls, CVA, prior syncope in 2021, dementia.  Clinical Impression  Pt presents to PT with deficits in functional mobility, balance, endurance, strength, power, however the pt may not be far from baseline. History obtained from chart review as the pt is an unreliable historian. Per chart review pt resides at [facility] and has required assistance for all transfers in the past. Pt is able to transfer at a min-modA level currently. Pt will benefit from acute PT services to avoid deconditioning this admission and to maintain her current level of function. PT recommends return to [facility] at the time of discharge, no need for continued PT services.    Follow Up Recommendations SNF (return to [facility], no need for skilled PT services)    Equipment Recommendations  None recommended by PT    Recommendations for Other Services       Precautions / Restrictions Precautions Precautions: Fall Restrictions Weight Bearing Restrictions: No      Mobility  Bed Mobility Overal bed mobility: Needs Assistance Bed Mobility: Supine to Sit;Sit to Supine     Supine to sit: Max assist Sit to supine: Mod assist        Transfers Overall transfer level: Needs assistance Equipment used: 1 person hand held assist Transfers: Sit to/from [employer] Transfers Sit to Stand: Mod assist Stand pivot transfers: Mod assist;Min assist (modA initially due to posterior lean, minA for 3 final transfers)          Ambulation/Gait                Stairs            Wheelchair Mobility    Modified Rankin  (Stroke Patients Only)       Balance Overall balance assessment: Needs assistance Sitting-balance support: Single extremity supported;Bilateral upper extremity supported;Feet supported Sitting balance-Leahy Scale: Poor Sitting balance - Comments: reliant on UE support   Standing balance support: Single extremity supported;Bilateral upper extremity supported Standing balance-Leahy Scale: Poor Standing balance comment: min-modA, posterior lean                             Pertinent Vitals/Pain Pain Assessment: No/denies pain    Home Living Family/patient expects to be discharged to:: Skilled nursing facility   Available Help at Discharge: Skilled Nursing Facility;Available 24 hours/day Type of Home: Skilled Nursing Facility                Prior Function Level of Independence: Needs assistance   Gait / Transfers Assistance Needed: pt requires assistance for all frunctional mobility, dependent for wheelchair mobility  ADL's / Homemaking Assistance Needed: assist for all ADL except self feeding        Hand Dominance   Dominant Hand: Right    Extremity/Trunk Assessment   Upper Extremity Assessment Upper Extremity Assessment: Generalized weakness    Lower Extremity Assessment Lower Extremity Assessment: Generalized weakness    Cervical / Trunk Assessment Cervical / Trunk Assessment: Kyphotic  Communication   Communication: HOH  Cognition Arousal/Alertness: Awake/alert Behavior During Therapy: Impulsive Overall Cognitive Status: History of cognitive impairments - at baseline  General Comments: pt with significant short term memory impairements. Pt reports her maiden name rather than married name. Pt is disoriented to time and situation. Poor safety awareness and poor awareness of deficits,      General Comments General comments (skin integrity, edema, etc.): VSS, pt on 2L [place] during session, unreliable  pulse ox reading intermittently during session. Pt in NAD at end of session    Exercises     Assessment/Plan    PT Assessment Patient needs continued PT services  PT Problem List Decreased strength;Decreased balance;Decreased mobility;Decreased knowledge of use of DME;Decreased safety awareness;Decreased knowledge of precautions;Cardiopulmonary status limiting activity       PT Treatment Interventions Functional mobility training;Therapeutic activities;Therapeutic exercise;Balance training;Neuromuscular re-education;Patient/family education    PT Goals (Current goals can be found in the Care Plan section)  Acute Rehab PT Goals Patient Stated Goal: pt unable to state, PT goal to reduce caregiver burden PT Goal Formulation: Patient unable to participate in goal setting Time For Goal Achievement: 03/21/21 Potential to Achieve Goals: Poor    Frequency Min 2X/week   Barriers to discharge        Co-evaluation               AM-PAC PT "6 Clicks" Mobility  Outcome Measure Help needed turning from your back to your side while in a flat bed without using bedrails?: A Little Help needed moving from lying on your back to sitting on the side of a flat bed without using bedrails?: A Lot Help needed moving to and from a bed to a chair (including a wheelchair)?: A Little Help needed standing up from a chair using your arms (e.g., wheelchair or bedside chair)?: A Lot Help needed to walk in hospital room?: Total Help needed climbing 3-5 steps with a railing? : Total 6 Click Score: 12    End of Session Equipment Utilized During Treatment: Oxygen Activity Tolerance: Patient tolerated treatment well Patient left: in bed;with call bell/phone within reach;with bed alarm set Nurse Communication: Mobility status PT Visit Diagnosis: Unsteadiness on feet (R26.81);Other abnormalities of gait and mobility (R26.89);Muscle weakness (generalized) (M62.81)    Time: 0623-7628 PT Time Calculation  (min) (ACUTE ONLY): 25 min   Charges:   PT Evaluation $PT Eval Low Complexity: 1 Low PT Treatments $Therapeutic Activity: 8-22 mins        Arlyss Gandy, PT, DPT Acute Rehabilitation Pager: [phone]   Arlyss Gandy 03/07/2021, 4:41 PM

## 2021-03-08 LAB — CBC
HCT: 30.1 % — ABNORMAL LOW (ref 36.0–46.0)
Hemoglobin: 9.5 g/dL — ABNORMAL LOW (ref 12.0–15.0)
MCH: 29.4 pg (ref 26.0–34.0)
MCHC: 31.6 g/dL (ref 30.0–36.0)
MCV: 93.2 fL (ref 80.0–100.0)
Platelets: 337 10*3/uL (ref 150–400)
RBC: 3.23 MIL/uL — ABNORMAL LOW (ref 3.87–5.11)
RDW: 13.9 % (ref 11.5–15.5)
WBC: 7.6 10*3/uL (ref 4.0–10.5)
nRBC: 0 % (ref 0.0–0.2)

## 2021-03-08 LAB — URINE CULTURE

## 2021-03-08 LAB — BASIC METABOLIC PANEL
Anion gap: 6 (ref 5–15)
BUN: 5 mg/dL — ABNORMAL LOW (ref 8–23)
CO2: 26 mmol/L (ref 22–32)
Calcium: 8.8 mg/dL — ABNORMAL LOW (ref 8.9–10.3)
Chloride: 104 mmol/L (ref 98–111)
Creatinine, Ser: 0.62 mg/dL (ref 0.44–1.00)
GFR, Estimated: >60 mL/min (ref >60)
Glucose, Bld: 107 mg/dL — ABNORMAL HIGH (ref 70–99)
Potassium: 2.9 mmol/L — ABNORMAL LOW (ref 3.5–5.1)
Sodium: 136 mmol/L (ref 135–145)

## 2021-03-08 MED ORDER — POTASSIUM CHLORIDE 20 MEQ PO PACK
40.0000 meq | PACK | Freq: Two times a day (BID) | ORAL | Status: AC
  Administered 2021-03-08 (×2): 40 meq via ORAL
  Filled 2021-03-08 (×2): qty 2

## 2021-03-08 MED ORDER — SODIUM CHLORIDE 0.9 % IV SOLN
2.0000 g | Freq: Two times a day (BID) | INTRAVENOUS | Status: DC
  Administered 2021-03-08 (×2): 2 g via INTRAVENOUS
  Filled 2021-03-08 (×3): qty 2

## 2021-03-08 MED ORDER — ADULT MULTIVITAMIN W/MINERALS CH
1.0000 | ORAL_TABLET | Freq: Every day | ORAL | Status: DC
  Administered 2021-03-08 – 2021-03-09 (×2): 1 via ORAL
  Filled 2021-03-08: qty 1

## 2021-03-08 NOTE — Progress Notes (Signed)
Pt's potassium is 2.9,  Dr.. David Stall made aware, KCL replacement ordered.

## 2021-03-08 NOTE — Progress Notes (Signed)
Pharmacy Antibiotic Note  Gina Hodges is a 87 y.o. female admitted on 03/05/2021 with sepsis possibly from aspiration pneumonia.  Pharmacy has been consulted for cefepime dosing.  WBC down to 7.6, afebrile. SCr improved since admission, CrCl now 41.   Plan: Adjust cefepime 2g IV to every 12 hours Metronidazole per MD  Follow up with cultures, antibiotic de-escalation and LOT Monitor renal function and clinical progress   Height: 5\' 2"  (157.5 cm) Weight: 66.7 kg (147 lb 0.8 oz) IBW/kg (Calculated) : 50.1  Temp (24hrs), Avg:98.5 F (36.9 C), Min:97.5 F (36.4 C), Max:98.9 F (37.2 C)  Recent Labs  Lab 03/05/21 2003 03/05/21 2203 03/06/21 0410 03/07/21 0305 03/08/21 0345  WBC 11.9*  --  10.8* 8.8 7.6  CREATININE 0.79  --  0.75 0.58 0.62  LATICACIDVEN 0.9 1.1  --   --   --     Estimated Creatinine Clearance: 41 mL/min (by C-G formula based on SCr of 0.62 mg/dL).    Allergies  Allergen Reactions  . Penicillins Other (See Comments)    "Allergic," per [facility] Did it involve swelling of the face/tongue/throat, SOB, or low BP? Unk Did it involve sudden or severe rash/hives, skin peeling, or any reaction on the inside of your mouth or nose? Unk Did you need to seek medical attention at a hospital or doctor's office? Unk When did it last happen? Unk If all above answers are "NO", may proceed with cephalosporin use.  . Bactrim [Sulfamethoxazole-Trimethoprim] Other (See Comments)    "Allergic," per MAR  . Sulfa Antibiotics Other (See Comments)    "Allergic," per MAR  . Codeine Palpitations and Other (See Comments)    "Allergic," per [facility]   Microbiology: 6/2 BCx - no growth x1d  6/2 UCx - no growth x1d  8/2, PharmD PGY1 Acute Care Pharmacy Resident Please refer to [facility] for unit-specific pharmacist

## 2021-03-08 NOTE — TOC Initial Note (Addendum)
Transition of Care [facility]) - Initial/Assessment Note    Patient Details  Name: Gina Hodges MRN: [id number] Date of Birth: July 13, 1927  Transition of Care [facility]) CM/SW Contact:    Gina Paradise, LCSW Phone Number: [phone] 03/08/2021, 1:19 PM  Clinical Narrative:                 CSW spoke with pt's nephew who is pt's POA Gina Hodges about discharging planning needs due to pt's orientation. CSW inquired about which [facility] that pt resided and was informed that it was [facility] explained that pt had been there a couple of years and the plan was for her to return.  CSW informed Gina Hodges that per PT evaluation that pt was at baseline therefore no skilled services were being recommended. He said that he understood and CSW explained that facility would be contacted to ascertain what is needed for her to return when closer to [place].  TOC team will continue to assist with discharge planning needs.   Expected Discharge Plan: Assisted Living Barriers to Discharge: Continued Medical Work up   Patient Goals and CMS Choice        Expected Discharge Plan and Services Expected Discharge Plan: Assisted Living       Living arrangements for the past 2 months: Assisted Living Facility                                      Prior Living Arrangements/Services Living arrangements for the past 2 months: Assisted Living Facility Lives with:: Facility Resident            Care giver support system in place?: Yes (comment)      Activities of Daily Living Home Assistive Devices/Equipment: Eyeglasses ADL Screening (condition at time of admission) Patient's cognitive ability adequate to safely complete daily activities?: Yes Is the patient deaf or have difficulty hearing?: Yes Does the patient have difficulty seeing, even when wearing glasses/contacts?: No Does the patient have difficulty concentrating, remembering, or making decisions?: Yes Patient able to express need for  assistance with ADLs?: Yes Does the patient have difficulty dressing or bathing?: Yes Independently performs ADLs?: No Communication: Independent Dressing (OT): Needs assistance Is this a change from baseline?: Pre-admission baseline Grooming: Independent Feeding: Independent Bathing: Needs assistance Is this a change from baseline?: Pre-admission baseline Toileting: Needs assistance Is this a change from baseline?: Pre-admission baseline In/Out Bed: Needs assistance Is this a change from baseline?: Pre-admission baseline Walks in Home: Needs assistance Is this a change from baseline?: Pre-admission baseline Does the patient have difficulty walking or climbing stairs?: Yes Weakness of Legs: Both Weakness of Arms/Hands: None  Permission Sought/Granted      Share Information with NAME: Gina Hodges  Permission granted to share info w AGENCY: [employer]  Permission granted to share info w Relationship: nephrew POA  Permission granted to share info w Contact Information: [phone]  Emotional Assessment   Attitude/Demeanor/Rapport: Unable to Assess Affect (typically observed): Unable to Assess Orientation: : Oriented to Self,Oriented to Place,Fluctuating Orientation (Suspected and/or reported [employer])      Admission diagnosis:  Hypoxia [R09.02] Multifocal pneumonia [J18.9] Community acquired pneumonia, unspecified laterality [J18.9] Sepsis with acute hypoxic respiratory failure without septic shock, due to unspecified organism (HCC) [A41.9, R65.20, J96.01] Patient Active Problem List   Diagnosis Date Noted  . Multifocal pneumonia 03/06/2021  . Sepsis (HCC) 11/10/2019  . AKI (acute kidney  injury) (HCC) 11/10/2019  . Fall 08/13/2019  . Ingrown nail 11/12/2013  . Compression fracture of spine (HCC) 11/07/2013  . Hypothyroidism 11/07/2013  . Hyperlipidemia   . Dementia with behavioral problem (HCC)   . GERD (gastroesophageal reflux disease)   . PAF (paroxysmal  atrial fibrillation) (HCC)   . Generalized anxiety disorder 04/05/2013   PCP:  Patient, No Pcp Per (Inactive) Pharmacy:   [facility] - [place], [place] - [address] [place] [address] Phone: [phone] Fax: [phone]  [facility] - [place], [place] [address] [place] [address] Phone: [phone] Fax: [phone]     Social Determinants of Health (SDOH) Interventions    Readmission Risk Interventions Readmission Risk Prevention Plan 11/12/2019  Post Dischage Appt Not Complete  Appt Comments facility resident  Medication Screening Complete  Transportation Screening Complete  Some recent data might be hidden

## 2021-03-08 NOTE — Progress Notes (Signed)
Initial Nutrition Assessment  DOCUMENTATION CODES:   Not applicable  INTERVENTION:    Magic cup TID with meals, each supplement provides 290 kcal and 9 grams of protein  MVI with minerals daily  NUTRITION DIAGNOSIS:   Inadequate oral intake related to dysphagia as evidenced by meal completion < 50%.  GOAL:   Patient will meet greater than or equal to 90% of their needs  MONITOR:   PO intake,Supplement acceptance  REASON FOR ASSESSMENT:   Malnutrition Screening Tool    ASSESSMENT:   87 yo female admitted with sepsis likely d/t aspiration PNA. PMH includes A fib, CVA, dementia.  S/P MBS with SLP 6/4. Found to have silent aspiration and dysphagia 1 diet with honey thick liquids initiated. Meal intakes not recorded, but suspect intake is poor due to restrictions for dysphagia / aspiration.   Unable to speak with patient today. No answer when phone number called.  Labs reviewed. K 2.9 CBG: 127-111  Medications reviewed and include Remeron, KCl, flagyl, cefipime.  Weight encounters reviewed.  Weight is down by 3% over the past 4 months, which is not significant.  NUTRITION - FOCUSED PHYSICAL EXAM:  unable to complete  Diet Order:   Diet Order            DIET - DYS 1 Room service appropriate? No; Fluid consistency: Honey Thick  Diet effective now                 EDUCATION NEEDS:   No education needs have been identified at this time  Skin:  Skin Assessment: Reviewed RN Assessment  Last BM:  6/4  Height:   Ht Readings from Last 1 Encounters:  03/06/21 5\' 2"  (1.575 m)    Weight:   Wt Readings from Last 1 Encounters:  03/08/21 66.7 kg    Ideal Body Weight:  50 kg  BMI:  Body mass index is 26.9 kg/m.  Estimated Nutritional Needs:   Kcal:  1500-1700  Protein:  80-90 gm  Fluid:  >/= 1.6 L    05/08/21, RD, LDN, CNSC Please refer to Amion for contact information.

## 2021-03-08 NOTE — Progress Notes (Signed)
[facility] PROGRESS NOTE    Progress Note  Gina Hodges  RUE:[id number] DOB: 03-24-1927 DOA: 03/05/2021 PCP: Patient, No Pcp Per (Inactive)     Brief Narrative:   Gina Hodges is an 87 y.o. female past medical history significant for permanent atrial fibrillation not on anticoagulation due to fall CVA prior syncope dementia comes into the ED for worsening shortness of breath for 3 days.  With some leukocytosis I am concerned about sepsis probably due to aspiration pneumonia.    Assessment/Plan:  Acute respiratory failure with hypoxia 2/2  Multifocal pneumonia: Has been satting greater 97% on room air. Currently on Flagyl and cefepime since admission. Urine culture is negative till date. Speech evaluation deemed her high risk for aspiration she is placed dysphagia 1 diet for an MBS  Sepsis present on admission possible due to bacterial pneumonia: Started on cefepime and Flagyl and fluid resuscitated sepsis physiology has resolved.  Permanent atrial fibrillation: Continue IV Lopressor as needed, off anticoagulation due to multiple falls.  Hypothyroidism Continue Synthroid.  Chronic anxiety/depression: Continue Xanax Effexor and Remeron.  Hyperlipidemia Discontinue Lipitor. Physical therapy recommended to return to skilled nursing facility.  Advanced dementia without behavioral disturbances: Continue Effexor.   DVT prophylaxis: lovenox Family Communication:none Status is: Inpatient  Remains inpatient appropriate because:Hemodynamically unstable   Dispo:  Patient From: Skilled Nursing Facility  Planned Disposition: Skilled Nursing Facility  Medically stable for discharge: No          Code Status:     Code Status Orders  (From admission, onward)         Start     Ordered   03/06/21 0958  Do not attempt resuscitation (DNR)  Continuous       Question Answer Comment  In the event of cardiac or respiratory ARREST Do not call a "code blue"   In the  event of cardiac or respiratory ARREST Do not perform Intubation, CPR, defibrillation or ACLS   In the event of cardiac or respiratory ARREST Use medication by any route, position, wound care, and other measures to relive pain and suffering. May use oxygen, suction and manual treatment of airway obstruction as needed for comfort.      03/06/21 0958        Code Status History    Date Active Date Inactive Code Status Order ID Comments User Context   03/06/2021 0402 03/06/2021 0958 Full Code [id number]  Darlin Drop, DO ED   03/06/2021 0334 03/06/2021 0402 DNR [id number]  Darlin Drop, DO ED   11/10/2019 1830 11/14/2019 0308 DNR [id number]  Jackie Plum, MD Inpatient   08/13/2019 2110 08/15/2019 0023 DNR [id number]  Kirt Boys, MD ED   08/13/2019 2110 08/13/2019 2110 DNR [id number]  Angelita Ingles, MD ED   08/13/2019 2007 08/13/2019 2110 Full Code [id number]  Angelita Ingles, MD ED   11/17/2018 0041 11/19/2018 1828 Full Code [id number]  Rometta Emery, MD Inpatient   02/19/2016 1606 02/24/2016 1855 DNR [id number]  Raliegh Ip, DO Inpatient   Advance Care Planning Activity        IV Access:    Peripheral IV   Procedures and diagnostic studies:   DG Swallowing Func-Speech Pathology  Result Date: 03/07/2021 Objective Swallowing Evaluation: Type of Study: MBS-Modified Barium Swallow Study  Patient Details Name: Gina Hodges MRN: [id number] Date of Birth: 1927-05-28 Today's Date: 03/07/2021 Time: SLP Start Time (ACUTE ONLY): 1305 -SLP Stop Time (ACUTE ONLY): 1330 SLP Time Calculation (min) (ACUTE ONLY): 25 min Past  Medical History: Past Medical History: Diagnosis Date . Anemia  . Anxiety  . Cerebral infarct (HCC)  . Dementia (HCC)  . Dementia with behavioral problem (HCC)  . GERD (gastroesophageal reflux disease)  . Hyperlipidemia  . Osteoporosis  . PAF (paroxysmal atrial fibrillation) (HCC)  . Stroke (HCC)  . Thyroid disease  . Ulcerative esophagitis  . UTI (urinary tract infection)  .  Vertigo  Past Surgical History: Past Surgical History: Procedure Laterality Date . ABDOMINAL HYSTERECTOMY   . APPENDECTOMY   . CHOLECYSTECTOMY   HPI: Pt is a 87 yo female presenting with 2-3 days of SOB and admitted with sepsis secondary to PNA. CTA Chest showed moderate bronchial thickening with areas of bronchial filling and mucoid impaction that may be due to bronchitis or aspiration. Additional opacities are concerning for PNA vs aspiration and pt had a large HH with the majority of the stomach being intrathoracic. PMH also includes: dementia, afib, CVA, GERD, anxiety, thyroid disase, ulcerative esophagitis. MBS in 11/2019 revealed sensed penetration to the cords with thin and nectar thick liquids. Pt did have instances of normal swallowing with nectar thick liquids and they were more spontaneously cleared, so a diet of Dys 1 solids and nectar thick liquids was initiated.  Subjective: Pt alert and cooperative Assessment / Plan / Recommendation CHL IP CLINICAL IMPRESSIONS 03/07/2021 Clinical Impression Pt was seen for a modified barium swallow study and she presents with moderate oropharyngeal dysphagia c/b silent aspiration of thin liquid and nectar-thick liquid (PAS 8).  Aspiration occurred before and during the swallow secondary to delayed swallow initiation at the level of the pyriform sinus and delayed laryngeal closure.  Pt was able to clear some of the aspirated material with a cued cough.  Aspiration mostly occurred with large sips of liquid; however, patient had difficulty following cues to take small sips.  Trace, shallow, transient laryngeal penetration (PAS 2) occurred x1 with both honey-thick liquid and puree, but it was not observed with additional trials of either consistency, and no aspiration occurred.  Oral phase was remarkable for decreased lingual control resulting in premature spillage to the pyriform sinus and decreased lingual strength resulting in trace oral residue.  Pharyngeal phase was  remarkable for decreased hyolaryngeal excursion resulting in trace-mild vallecular and pyriform residue.  Pharyngoesophageal phase was unremarkable.  Recommend Dysphagia 1 (puree) solids and honey-thick liquids with medications administered crushed in puree.  SLP Visit Diagnosis Dysphagia, oropharyngeal phase (R13.12) Attention and concentration deficit following -- Frontal lobe and executive function deficit following -- Impact on safety and function Moderate aspiration risk   CHL IP TREATMENT RECOMMENDATION 03/07/2021 Treatment Recommendations Therapy as outlined in treatment plan below   Prognosis 03/07/2021 Prognosis for Safe Diet Advancement Fair Barriers to Reach Goals Time post onset Barriers/Prognosis Comment -- CHL IP DIET RECOMMENDATION 03/07/2021 SLP Diet Recommendations Dysphagia 1 (Puree) solids;Honey thick liquids Liquid Administration via Cup;Straw Medication Administration Crushed with puree Compensations Minimize environmental distractions;Slow rate;Small sips/bites Postural Changes --   CHL IP OTHER RECOMMENDATIONS 03/07/2021 Recommended Consults -- Oral Care Recommendations Oral care BID Other Recommendations Order thickener from pharmacy;Prohibited food (jello, ice cream, thin soups);Remove water pitcher   CHL IP FOLLOW UP RECOMMENDATIONS 03/07/2021 Follow up [facility]ations [facility] D[facility]/07[facility]2021 Speech Therapy Frequency (ACUTE ONLY) min 2x/week Treatment Duration 2 weeks      CHL IP ORAL PHASE 03/07/2021 Oral Phase Impaired Oral - Pudding Teaspoon Premature spillage;Lingual/palatal residue Oral - Pudding Cup -- Oral - Honey Teaspoon --  Oral - Honey Cup Decreased bolus cohesion;Premature spillage;Piecemeal swallowing;Lingual/palatal residue Oral - Nectar Teaspoon -- Oral - Nectar Cup Premature spillage;Decreased bolus cohesion;Piecemeal swallowing;Lingual/palatal residue Oral - Nectar Straw Lingual/palatal residue;Piecemeal swallowing;Decreased bolus  cohesion;Premature spillage Oral - Thin Teaspoon Decreased bolus cohesion;Premature spillage;Piecemeal swallowing;Lingual/palatal residue Oral - Thin Cup Premature spillage;Decreased bolus cohesion;Piecemeal swallowing;Lingual/palatal residue Oral - Thin Straw Lingual/palatal residue;Piecemeal swallowing;Decreased bolus cohesion;Premature spillage Oral - Puree -- Oral - Mech Soft -- Oral - Regular -- Oral - Multi-Consistency -- Oral - Pill -- Oral Phase - Comment --  CHL IP PHARYNGEAL PHASE 03/07/2021 Pharyngeal Phase Impaired Pharyngeal- Pudding Teaspoon Delayed swallow initiation-pyriform sinuses;Penetration/Aspiration during swallow;Pharyngeal residue - valleculae;Reduced airway/laryngeal closure;Reduced anterior laryngeal mobility Pharyngeal Material enters airway, remains ABOVE vocal cords then ejected out Pharyngeal- Pudding Cup NT Pharyngeal -- Pharyngeal- Honey Teaspoon NT Pharyngeal -- Pharyngeal- Honey Cup Delayed swallow initiation-pyriform sinuses;Reduced anterior laryngeal mobility;Reduced airway/laryngeal closure;Penetration/Aspiration during swallow;Pharyngeal residue - valleculae Pharyngeal Material enters airway, remains ABOVE vocal cords then ejected out Pharyngeal- Nectar Teaspoon NT Pharyngeal -- Pharyngeal- Nectar Cup Delayed swallow initiation-pyriform sinuses;Penetration/Aspiration before swallow;Penetration/Aspiration during swallow;Penetration/Apiration after swallow;Trace aspiration;Pharyngeal residue - valleculae;Pharyngeal residue - pyriform;Reduced anterior laryngeal mobility;Reduced airway/laryngeal closure Pharyngeal Material enters airway, passes BELOW cords without attempt by patient to eject out (silent aspiration) Pharyngeal- Nectar Straw NT Pharyngeal -- Pharyngeal- Thin Teaspoon Delayed swallow initiation-pyriform sinuses;Reduced anterior laryngeal mobility;Reduced airway/laryngeal closure;Penetration/Aspiration before swallow;Penetration/Aspiration during swallow;Pharyngeal  residue - valleculae Pharyngeal Material enters airway, CONTACTS cords and not ejected out Pharyngeal- Thin Cup NT Pharyngeal -- Pharyngeal- Thin Straw Delayed swallow initiation-pyriform sinuses;Reduced airway/laryngeal closure;Reduced anterior laryngeal mobility;Penetration/Aspiration before swallow;Moderate aspiration;Pharyngeal residue - valleculae;Pharyngeal residue - pyriform Pharyngeal Material enters airway, passes BELOW cords without attempt by patient to eject out (silent aspiration) Pharyngeal- Puree -- Pharyngeal -- Pharyngeal- Mechanical Soft -- Pharyngeal -- Pharyngeal- Regular -- Pharyngeal -- Pharyngeal- Multi-consistency -- Pharyngeal -- Pharyngeal- Pill -- Pharyngeal -- Pharyngeal Comment --  CHL IP CERVICAL ESOPHAGEAL PHASE 03/07/2021 Cervical Esophageal Phase WFL Pudding Teaspoon -- Pudding Cup -- Honey Teaspoon -- Honey Cup -- Nectar Teaspoon -- Nectar Cup -- Nectar Straw -- Thin Teaspoon -- Thin Cup -- Thin Straw -- Puree -- Mechanical Soft -- Regular -- Multi-consistency -- Pill -- Cervical Esophageal Comment -- Villa Herb M.S., CCC-SLP Acute Rehabilitation Services Office: [phone] Shanon Rosser [facility] 03/07/2021, 2:13 PM                Medical Consultants:    None.   Subjective:    Gina Hodges more awake today she relates she had a bad night.  Objective:    Vitals:   03/07/21 2000 03/08/21 0000 03/08/21 0400 03/08/21 0759  BP: 96/73 133/74 124/76 125/66  Pulse: 100 97 90 81  Resp: 20 19 18 20   Temp: (!) 97.5 F (36.4 C) 98.3 F (36.8 C) 98.7 F (37.1 C) 98.6 F (37 C)  TempSrc: Oral Oral Oral Oral  SpO2: 97% 100% 100% 99%  Weight:   66.7 kg   Height:       SpO2: 99 % O2 Flow Rate (L/min): 1 L/min   Intake/Output Summary (Last 24 hours) at 03/08/2021 0818 Last data filed at 03/08/2021 0447 Gross per 24 hour  Intake 1536.35 ml  Output 700 ml  Net 836.35 ml   Filed Weights   03/06/21 1400 03/07/21 0037 03/08/21 0400  Weight: 66.3 kg 66.1 kg 66.7 kg     Exam: General exam: In no acute distress. Respiratory system: Good air movement and clear to auscultation. Cardiovascular system: S1 &  S2 heard, RRR. No JVD. Gastrointestinal system: Abdomen is nondistended, soft and nontender.  Extremities: No pedal edema. Skin: No rashes, lesions or ulcers Psychiatry: Judgement and insight appear normal. Mood & affect appropriate.   Data Reviewed:    Labs: Basic Metabolic Panel: Recent Labs  Lab 03/05/21 2003 03/05/21 2057 03/06/21 0410 03/07/21 0305 03/08/21 0345  NA 133* 133* 135 134* 136  K 3.6 4.0 3.6 3.8 2.9*  CL 101  --  102 103 104  CO2 24  --  24 19* 26  GLUCOSE 109*  --  115* 119* 107*  BUN 16  --  11 8 5*  CREATININE 0.79  --  0.75 0.58 0.62  CALCIUM 8.1*  --  8.5* 8.5* 8.8*  MG  --   --  1.8  --   --   PHOS  --   --  2.8  --   --    GFR Estimated Creatinine Clearance: 41 mL/min (by C-G formula based on SCr of 0.62 mg/dL). Liver Function Tests: Recent Labs  Lab 03/05/21 2003  AST 17  ALT 5  ALKPHOS 75  BILITOT 0.6  PROT 5.6*  ALBUMIN 2.7*   No results for input(s): LIPASE, AMYLASE in the last 168 hours. No results for input(s): AMMONIA in the last 168 hours. Coagulation profile No results for input(s): INR, PROTIME in the last 168 hours. COVID-19 Labs  No results for input(s): DDIMER, FERRITIN, LDH, CRP in the last 72 hours.  Lab Results  Component Value Date   SARSCOV2NAA NEGATIVE 03/05/2021   SARSCOV2NAA NEGATIVE 03/05/2021   SARSCOV2NAA NEGATIVE 11/12/2019   SARSCOV2NAA NEGATIVE 11/10/2019    CBC: Recent Labs  Lab 03/05/21 2003 03/05/21 2057 03/06/21 0410 03/07/21 0305 03/08/21 0345  WBC 11.9*  --  10.8* 8.8 7.6  NEUTROABS 7.6  --   --   --   --   HGB 10.0* 10.5* 10.1* 9.9* 9.5*  HCT 31.5* 31.0* 32.7* 31.7* 30.1*  MCV 94.0  --  95.3 93.5 93.2  PLT 300  --  294 310 337   Cardiac Enzymes: No results for input(s): CKTOTAL, CKMB, CKMBINDEX, TROPONINI in the last 168 hours. BNP (last 3  results) No results for input(s): PROBNP in the last 8760 hours. CBG: Recent Labs  Lab 03/07/21 0620 03/07/21 2247  GLUCAP 127* 111*   D-Dimer: No results for input(s): DDIMER in the last 72 hours. Hgb A1c: No results for input(s): HGBA1C in the last 72 hours. Lipid Profile: No results for input(s): CHOL, HDL, LDLCALC, TRIG, CHOLHDL, LDLDIRECT in the last 72 hours. Thyroid function studies: No results for input(s): TSH, T4TOTAL, T3FREE, THYROIDAB in the last 72 hours.  Invalid input(s): FREET3 Anemia work up: No results for input(s): VITAMINB12, FOLATE, FERRITIN, TIBC, IRON, RETICCTPCT in the last 72 hours. Sepsis Labs: Recent Labs  Lab 03/05/21 2003 03/05/21 2203 03/06/21 0410 03/07/21 0305 03/08/21 0345  PROCALCITON  --   --  <0.10  --   --   WBC 11.9*  --  10.8* 8.8 7.6  LATICACIDVEN 0.9 1.1  --   --   --    Microbiology Recent Results (from the past 240 hour(s))  SARS CORONAVIRUS 2 (TAT 6-24 HRS) Nasopharyngeal Nasopharyngeal Swab     Status: None   Collection Time: 03/05/21  7:48 PM   Specimen: Nasopharyngeal Swab  Result Value Ref Range Status   SARS Coronavirus 2 NEGATIVE NEGATIVE Final    Comment: (NOTE) SARS-CoV-2 target nucleic acids are NOT DETECTED.  The SARS-CoV-2  RNA is generally detectable in upper and lower respiratory specimens during the acute phase of infection. Negative results do not preclude SARS-CoV-2 infection, do not rule out co-infections with other pathogens, and should not be used as the sole basis for treatment or other patient management decisions. Negative results must be combined with clinical observations, patient history, and epidemiological information. The expected result is Negative.  Fact Sheet for Patients: HairSlick.no  Fact Sheet for Healthcare Providers: quierodirigir.com  This test is not yet approved or cleared by the [place] FDA and  has been authorized  for detection and/or diagnosis of SARS-CoV-2 by FDA under an Emergency Use Authorization (EUA). This EUA will remain  in effect (meaning this test can be used) for the duration of the COVID-19 declaration under Se ction 564(b)(1) of the Act, 21 U.S.C. section 360bbb-3(b)(1), unless the authorization is terminated or revoked sooner.  Performed at [facility], [address] [place], [place] [address]   Urine culture     Status: None (Preliminary result)   Collection Time: 03/05/21  8:03 PM   Specimen: In/Out Cath Urine  Result Value Ref Range Status   Specimen Description IN/OUT CATH URINE  Final   Special Requests NONE  Final   Culture   Final    CULTURE REINCUBATED FOR BETTER GROWTH Performed at [facility], [address], [place], [place] [address]    Report Status PENDING  Incomplete  Blood culture (routine single)     Status: None (Preliminary result)   Collection Time: 03/05/21  8:40 PM   Specimen: BLOOD  Result Value Ref Range Status   Specimen Description BLOOD LEFT ARM  Final   Special Requests   Final    BOTTLES DRAWN AEROBIC AND ANAEROBIC Blood Culture results may not be optimal due to an inadequate volume of blood received in culture bottles   Culture   Final    NO GROWTH 1 DAY Performed at [facility], [address], [place], [place] [address]    Report Status PENDING  Incomplete  Resp Panel by RT-PCR (Flu A&B, Covid) Nasopharyngeal Swab     Status: None   Collection Time: 03/05/21 11:32 PM   Specimen: Nasopharyngeal Swab; Nasopharyngeal(NP) swabs in vial transport medium  Result Value Ref Range Status   SARS Coronavirus 2 by RT PCR NEGATIVE NEGATIVE Final    Comment: (NOTE) SARS-CoV-2 target nucleic acids are NOT DETECTED.  The SARS-CoV-2 RNA is generally detectable in upper respiratory specimens during the acute phase of infection. The lowest concentration of SARS-CoV-2 viral copies this assay can detect is 138 copies/mL. A negative  result does not preclude SARS-Cov-2 infection and should not be used as the sole basis for treatment or other patient management decisions. A negative result may occur with  improper specimen collection/handling, submission of specimen other than nasopharyngeal swab, presence of viral mutation(s) within the areas targeted by this assay, and inadequate number of viral copies(<138 copies/mL). A negative result must be combined with clinical observations, patient history, and epidemiological information. The expected result is Negative.  Fact Sheet for Patients:  BloggerCourse.com  Fact Sheet for Healthcare Providers:  SeriousBroker.it  This test is no t yet approved or cleared by the [place] FDA and  has been authorized for detection and/or diagnosis of SARS-CoV-2 by FDA under an Emergency Use Authorization (EUA). This EUA will remain  in effect (meaning this test can be used) for the duration of the COVID-19 declaration under Section 564(b)(1)  of the Act, 21 U.S.C.section 360bbb-3(b)(1), unless the authorization is terminated  or revoked sooner.       Influenza A by PCR NEGATIVE NEGATIVE Final   Influenza B by PCR NEGATIVE NEGATIVE Final    Comment: (NOTE) The Xpert Xpress SARS-CoV-2/FLU/RSV plus assay is intended as an aid in the diagnosis of influenza from Nasopharyngeal swab specimens and should not be used as a sole basis for treatment. Nasal washings and aspirates are unacceptable for Xpert Xpress SARS-CoV-2/FLU/RSV testing.  Fact Sheet for Patients: BloggerCourse.com  Fact Sheet for Healthcare Providers: SeriousBroker.it  This test is not yet approved or cleared by the [place] FDA and has been authorized for detection and/or diagnosis of SARS-CoV-2 by FDA under an Emergency Use Authorization (EUA). This EUA will remain in effect (meaning this test can be used)  for the duration of the COVID-19 declaration under Section 564(b)(1) of the Act, 21 U.S.C. section 360bbb-3(b)(1), unless the authorization is terminated or revoked.  Performed at [facility], [address], [place], [place] [address]      Medications:   . atorvastatin  5 mg Oral QHS  . enoxaparin (LOVENOX) injection  40 mg Subcutaneous Daily  . guaiFENesin  600 mg Oral BID  . levothyroxine  75 mcg Oral QAC breakfast  . melatonin  10 mg Oral QHS  . mirtazapine  7.5 mg Oral QHS  . pantoprazole  40 mg Oral Daily  . potassium chloride  40 mEq Oral BID  . venlafaxine XR  75 mg Oral Q breakfast   Continuous Infusions: . ceFEPime (MAXIPIME) IV 2 g (03/07/21 2020)  . metronidazole 500 mg (03/08/21 0615)      LOS: 2 days   Marinda Elk  [facility]  03/08/2021, 8:18 AM

## 2021-03-09 LAB — BASIC METABOLIC PANEL
Anion gap: 8 (ref 5–15)
BUN: 5 mg/dL — ABNORMAL LOW (ref 8–23)
CO2: 23 mmol/L (ref 22–32)
Calcium: 9 mg/dL (ref 8.9–10.3)
Chloride: 108 mmol/L (ref 98–111)
Creatinine, Ser: 0.69 mg/dL (ref 0.44–1.00)
GFR, Estimated: >60 mL/min (ref >60)
Glucose, Bld: 102 mg/dL — ABNORMAL HIGH (ref 70–99)
Potassium: 4 mmol/L (ref 3.5–5.1)
Sodium: 139 mmol/L (ref 135–145)

## 2021-03-09 MED ORDER — AMOXICILLIN-POT CLAVULANATE 875-125 MG PO TABS: 1.0000 | ORAL_TABLET | Freq: Two times a day (BID) | ORAL | Status: DC

## 2021-03-09 MED ORDER — AMOXICILLIN-POT CLAVULANATE 875-125 MG PO TABS: 1.0000 | ORAL_TABLET | Freq: Two times a day (BID) | ORAL | 0 refills | Status: DC

## 2021-03-09 MED ORDER — AMOXICILLIN-POT CLAVULANATE 875-125 MG PO TABS
1.0000 | ORAL_TABLET | Freq: Two times a day (BID) | ORAL | Status: DC
  Administered 2021-03-09: 1 via ORAL
  Filled 2021-03-09: qty 1

## 2021-03-09 NOTE — Progress Notes (Signed)
  Speech Language Pathology Treatment: Dysphagia  Patient Details Name: Kylan Veach MRN: [id number] DOB: 24-Oct-1926 Today's Date: 03/09/2021 Time: 6314-9702 SLP Time Calculation (min) (ACUTE ONLY): 8 min  Assessment / Plan / Recommendation Clinical Impression  Pt wanted minimal PO intake, but did consume some purees and honey thick liquids with only a single cough about one minute after intake had stopped. This is significantly reduced coughing compared to previous observation at bedside, and per MBS this weekend, no aspiration occurred with these consistencies. Will continue with current diet for now particularly in light of acute infection. Pt will benefit from further f/u acutely and at SNF, with consideration for potentially more chronic swallowing difficulties in light of prior dysphagia on MBS with h/o CVA, dementia, and esophageal issues.    HPI HPI: Pt is a 87 yo female presenting with 2-3 days of SOB and admitted with sepsis secondary to PNA. CTA Chest showed moderate bronchial thickening with areas of bronchial filling and mucoid impaction that may be due to bronchitis or aspiration. Additional opacities are concerning for PNA vs aspiration and pt had a large HH with the majority of the stomach being intrathoracic. PMH also includes: dementia, afib, CVA, GERD, anxiety, thyroid disase, ulcerative esophagitis. MBS in 11/2019 revealed sensed penetration to the cords with thin and nectar thick liquids. Pt did have instances of normal swallowing wtih nectar thick liquids and they were more spontaneously cleared, so a diet of Dys 1 solids and nectar thick liquids was initiated.      SLP Plan  Continue with current plan of care       Recommendations  Diet recommendations: Dysphagia 1 (puree);Honey-thick liquid Liquids provided via: Teaspoon;Straw Medication Administration: Crushed with puree Supervision: Staff to assist with self feeding;Full supervision/cueing for compensatory  strategies Compensations: Minimize environmental distractions;Slow rate;Small sips/bites Postural Changes and/or Swallow Maneuvers: Seated upright 90 degrees                Oral Care Recommendations: Oral care BID Follow up Recommendations: Skilled Nursing facility SLP Visit Diagnosis: Dysphagia, oropharyngeal phase (R13.12) Plan: Continue with current plan of care       GO                 Mahala Menghini., M.A. CCC-SLP Acute Rehabilitation Services Pager [phone] Office [phone]  03/09/2021, 12:02 PM

## 2021-03-09 NOTE — TOC Progression Note (Signed)
Transition of Care [facility]) - Progression Note    Patient Details  Name: Gina Hodges MRN: [id number] Date of Birth: 18-Jun-1927  [facility]) CM/SW Contact  Leone Haven, RN Phone Number: 03/09/2021, 1:58 PM  Clinical Narrative:    NCM was notified by CSW that patient will need oxygen at [facility], CSW states Chip Boer said they use [employer]. NCM spoke with Morrie Sheldon at [facility] he states he did speak with [facility] about patient.  He will need the ambulatory sats and the order corrected.  NCM informed MD to sign off on the corrected oxygen order.     Expected Discharge Plan: Assisted Living Barriers to Discharge: Continued Medical Work up  Expected Discharge Plan and Services Expected Discharge Plan: Assisted Living       Living arrangements for the past 2 months: Assisted Living Facility Expected Discharge Date: 03/09/21                                     Social Determinants of Health (SDOH) Interventions    Readmission Risk Interventions Readmission Risk Prevention Plan 11/12/2019  Post Dischage Appt Not Complete  Appt Comments facility resident  Medication Screening Complete  Transportation Screening Complete  Some recent data might be hidden

## 2021-03-09 NOTE — [place] FL2 (Signed)
[place] MEDICAID FL2 LEVEL OF CARE SCREENING TOOL     IDENTIFICATION  Patient Name: Gina Hodges Birthdate: 1927/07/15 Sex: female Admission Date (Current Location): 03/05/2021  [facility] and [place] Number:  Producer, television/film/video and Address:  [facility]. [facility], [address], [place], [place] [address]      Provider Number: [id number]  Attending Physician Name and Address:  Marinda Elk, MD  Relative Name and Phone Number:  Kathlen Brunswick [facility])   [phone]    Current Level of Care: Hospital Recommended Level of Care: Assisted Living Facility [facility]) Prior Approval Number:    Date Approved/Denied:   PASRR Number:    Discharge Plan: Other (Comment) (ALF)    Current Diagnoses: Patient Active Problem List   Diagnosis Date Noted  . Multifocal pneumonia 03/06/2021  . Sepsis (HCC) 11/10/2019  . AKI (acute kidney injury) (HCC) 11/10/2019  . Fall 08/13/2019  . Ingrown nail 11/12/2013  . Compression fracture of spine (HCC) 11/07/2013  . Hypothyroidism 11/07/2013  . Hyperlipidemia   . Dementia with behavioral problem (HCC)   . GERD (gastroesophageal reflux disease)   . PAF (paroxysmal atrial fibrillation) (HCC)   . Generalized anxiety disorder 04/05/2013    Orientation RESPIRATION BLADDER Height & Weight         (2L Nasal Cannula for 2 weeks.) Incontinent,External catheter Weight: 145 lb 15.1 oz (66.2 kg) Height:  5\' 2"  (157.5 cm)  BEHAVIORAL SYMPTOMS/MOOD NEUROLOGICAL BOWEL NUTRITION STATUS      Incontinent Diet (Regular Diet)  AMBULATORY STATUS COMMUNICATION OF NEEDS Skin   Extensive Assist Verbally                         Personal Care Assistance Level of Assistance  Bathing,Feeding,Dressing Bathing Assistance: Maximum assistance Feeding assistance: Limited assistance Dressing Assistance: Maximum assistance     Functional Limitations Info  Speech,Sight,Hearing Sight Info: Impaired Hearing Info: Adequate Speech  Info: Adequate    SPECIAL CARE FACTORS FREQUENCY  PT (By licensed PT),OT (By licensed OT)     PT Frequency: 3x a week OT Frequency: 3x a week            Contractures Contractures Info: Not present    Additional Factors Info  Code Status,Allergies Code Status Info: DNR Allergies Info: Penicillins   Bactrim (Sulfamethoxazole-trimethoprim)   Sulfa Antibiotics   Codeine           Current Medications (03/09/2021):  This is the current hospital active medication list Current Facility-Administered Medications  Medication Dose Route Frequency Provider Last Rate Last Admin  . albuterol (PROVENTIL) (2.5 MG/3ML) 0.083% nebulizer solution 2.5 mg  2.5 mg Nebulization Q4H PRN 05/09/2021, MD   2.5 mg at 03/09/21 0529  . ALPRAZolam 05/09/21) tablet 0.25 mg  0.25 mg Oral BID PRN Prudy Feeler N, DO   0.25 mg at 03/07/21 2021  . amoxicillin-clavulanate (AUGMENTIN) 875-125 MG per tablet 1 tablet  1 tablet Oral Q12H 2022, MD   1 tablet at 03/09/21 [phone]  . atorvastatin (LIPITOR) tablet 5 mg  5 mg Oral QHS Hall, Carole N, DO   5 mg at 03/08/21 2048  . benzonatate (TESSALON) capsule 100 mg  100 mg Oral TID PRN 2049, MD      . enoxaparin (LOVENOX) injection 40 mg  40 mg Subcutaneous Daily Marinda Elk N, DO   40 mg at 03/09/21 05/09/21  . guaiFENesin (MUCINEX) 12 hr tablet 600 mg  600  mg Oral BID Maretta Bees, MD   600 mg at 03/09/21 9675  . levothyroxine (SYNTHROID) tablet 75 mcg  75 mcg Oral QAC breakfast Darlin Drop, DO   75 mcg at 03/09/21 9163  . melatonin tablet 10 mg  10 mg Oral QHS Dow Adolph N, DO   10 mg at 03/08/21 2048  . metoprolol tartrate (LOPRESSOR) injection 2.5 mg  2.5 mg Intravenous Q6H PRN Dow Adolph N, DO      . mirtazapine (REMERON) tablet 7.5 mg  7.5 mg Oral QHS Hall, Carole N, DO   7.5 mg at 03/08/21 2048  . multivitamin with minerals tablet 1 tablet  1 tablet Oral Daily Marinda Elk, MD   1 tablet at 03/09/21 0827  .  pantoprazole (PROTONIX) EC tablet 40 mg  40 mg Oral Daily Dow Adolph N, DO   40 mg at 03/09/21 0830  . venlafaxine XR (EFFEXOR-XR) 24 hr capsule 75 mg  75 mg Oral Q breakfast Dow Adolph N, DO   75 mg at 03/09/21 0830     Discharge Medications: Please see discharge summary for a list of discharge medications.  Relevant Imaging Results:  Relevant Lab Results:   Additional Information SSN: [id number] [place]

## 2021-03-09 NOTE — Progress Notes (Signed)
O2 assessment completed  Pt was very weak. She wasn't able to hold herself up to sitting position.  95 at resting on 2L 85% movement in bed on 2L

## 2021-03-09 NOTE — TOC Progression Note (Addendum)
Transition of Care [facility]) - Progression Note    Patient Details  Name: Gina Hodges MRN: [id number] Date of Birth: 05-01-1927  Transition of Care [facility]) CM/SW Contact  Ivette Loyal, [place] Phone Number: 03/09/2021, 1:21 PM  Clinical Narrative:    1310: CSW spoke with JJ at [facility] after faxing DC summary and FL2 JJ asked for o2 to be delivered before pt DC. CSW contacted [employer] to set up o2/delivery, JJ will contact CSW after when o2 is received for DC.  1449: CSW contacted pt nephew to update about DC back to [place], he is okay with DC and has no follow up questions.   Expected Discharge Plan: Assisted Living Barriers to Discharge: Continued Medical Work up  Expected Discharge Plan and Services Expected Discharge Plan: Assisted Living       Living arrangements for the past 2 months: Assisted Living Facility Expected Discharge Date: 03/09/21                                     Social Determinants of Health (SDOH) Interventions    Readmission Risk Interventions Readmission Risk Prevention Plan 11/12/2019  Post Dischage Appt Not Complete  Appt Comments facility resident  Medication Screening Complete  Transportation Screening Complete  Some recent data might be hidden

## 2021-03-09 NOTE — Discharge Summary (Addendum)
Physician Discharge Summary  Gina Hodges ZOX:[id number] DOB: 23-May-1927 DOA: 03/05/2021  PCP: Patient, No Pcp Per (Inactive)  Admit date: 03/05/2021 Discharge date: 03/09/2021  Admitted From: SNF Disposition:  SNF  Recommendations for Outpatient Follow-up:  1. Follow up with PCP in 1-2 weeks 2. Please obtain BMP/CBC in one week 3. Palliative care to meet with family at facility to End of life and readmissions to the hospital. 4. She will benefit from oxygen supplementation 2 L, to keep saturation > 92%.  Home Health:No Equipment/Devices:None  Discharge Condition:Stable CODE STATUS: DNR Diet recommendation: Heart Healthy  Brief/Interim Summary: 87 y.o. female past medical history significant for permanent atrial fibrillation not on anticoagulation due to fall CVA prior syncope dementia comes into the ED for worsening shortness of breath for 3 days.  With some leukocytosis I am concerned about sepsis probably due to aspiration pneumonia.  Discharge Diagnoses:  Active Problems:   Multifocal pneumonia  Acute respiratory failure with hypoxia secondary to multifocal pneumonia: She was started on cefepime and Flagyl culture data remain negative. She was weaned to room air satting 97%.  Overnight she was placed on oxygen as she desats she probably has undiagnosed due to sleep apnea.  Sepsis due to bacterial pneumonia: Resolved she was treated with IV fluids and empiric antibiotics.  Permanent atrial fibrillation: Rate controlled not a candidate for anticoagulation due to multiple falls.  Hypothyroidism: Continue Synthroid.  Chronic anxiety and depression: Continue current home medications.  Hyperlipidemia: Lipitor discontinued.  Advance dementia without behavioral disturbance disturbances: Continue Effexor.  Discharge Instructions  Discharge Instructions    Diet - low sodium heart healthy   Complete by: As directed    Increase activity slowly   Complete by: As directed       Allergies as of 03/09/2021      Reactions   Penicillins Other (See Comments)   "Allergic," per [facility] Did it involve swelling of the face/tongue/throat, SOB, or low BP? Unk Did it involve sudden or severe rash/hives, skin peeling, or any reaction on the inside of your mouth or nose? Unk Did you need to seek medical attention at a hospital or doctor's office? Unk When did it last happen? Unk If all above answers are "NO", may proceed with cephalosporin use.   Bactrim [sulfamethoxazole-trimethoprim] Other (See Comments)   "Allergic," per [facility]   Sulfa Antibiotics Other (See Comments)   "Allergic," per [facility]   Codeine Palpitations, Other (See Comments)   "Allergic," per [facility]      Medication List    STOP taking these medications   ALPRAZolam 0.5 MG tablet Commonly known as: XANAX   atorvastatin 10 MG tablet Commonly known as: LIPITOR     TAKE these medications   acetaminophen 650 MG CR tablet Commonly known as: TYLENOL Take 650 mg by mouth 3 (three) times daily.   alum & mag hydroxide-simeth 200-200-20 MG/5ML suspension Commonly known as: MAALOX/MYLANTA Take 30 mLs by mouth every 8 (eight) hours as needed for indigestion or heartburn.   amoxicillin-clavulanate 875-125 MG tablet Commonly known as: AUGMENTIN Take 1 tablet by mouth every 12 (twelve) hours.   Calcium Carbonate-Vitamin D 600-400 MG-UNIT tablet Take 1 tablet by mouth 2 (two) times daily.   levothyroxine 75 MCG tablet Commonly known as: SYNTHROID Take 75 mcg by mouth daily before breakfast.   Lidocaine 4 % Ptch Apply 1 patch topically See admin instructions. Apply 1 patch to affected area(s) two times a day and remove, per schedule   loperamide 2 MG capsule Commonly  known as: IMODIUM Take 2-4 mg by mouth See admin instructions. Take 2 capsules (4 mg) by mouth at first loose stool, then take 1 capsule (2 mg) every 2 hours as needed for diarrhea/loose stools   loratadine 10 MG tablet Commonly known as:  CLARITIN Take 10 mg by mouth daily.   Melatonin 10 MG Tabs Take 10 mg by mouth at bedtime.   meloxicam 15 MG tablet Commonly known as: MOBIC Take 15 mg by mouth daily.   mirtazapine 15 MG tablet Commonly known as: REMERON Take 15 mg by mouth at bedtime.   multivitamin with minerals Tabs tablet Take 1 tablet by mouth daily.   omeprazole 20 MG capsule Commonly known as: PRILOSEC Take 20 mg by mouth 2 (two) times daily.   polyethylene glycol 17 g packet Commonly known as: MIRALAX / GLYCOLAX Take 17 g by mouth daily as needed for mild constipation (MIX AND DRINK).   senna 8.6 MG Tabs tablet Commonly known as: SENOKOT Take 1 tablet by mouth daily.   traZODone 150 MG tablet Commonly known as: DESYREL Take 75 mg by mouth at bedtime.   venlafaxine XR 75 MG 24 hr capsule Commonly known as: EFFEXOR-XR Take 75 mg by mouth daily with breakfast.       Allergies  Allergen Reactions  . Penicillins Other (See Comments)    "Allergic," per [facility] Did it involve swelling of the face/tongue/throat, SOB, or low BP? Unk Did it involve sudden or severe rash/hives, skin peeling, or any reaction on the inside of your mouth or nose? Unk Did you need to seek medical attention at a hospital or doctor's office? Unk When did it last happen? Unk If all above answers are "NO", may proceed with cephalosporin use.  . Bactrim [Sulfamethoxazole-Trimethoprim] Other (See Comments)    "Allergic," per [facility]  . Sulfa Antibiotics Other (See Comments)    "Allergic," per [facility]  . Codeine Palpitations and Other (See Comments)    "Allergic," per [facility]    Consultations:  None   Procedures/Studies: CT Angio Chest PE W and/or Wo Contrast  Result Date: 03/05/2021 CLINICAL DATA:  PE suspected, high prob Hypoxia and increased fatigue. EXAM: CT ANGIOGRAPHY CHEST WITH CONTRAST TECHNIQUE: Multidetector CT imaging of the chest was performed using the standard protocol during bolus administration of intravenous  contrast. Multiplanar CT image reconstructions and MIPs were obtained to evaluate the vascular anatomy. CONTRAST:  52mL OMNIPAQUE IOHEXOL 350 MG/ML SOLN COMPARISON:  Radiograph earlier today.  Chest CTA 11/17/2018 FINDINGS: Cardiovascular: There are no filling defects within the pulmonary arteries to suggest pulmonary embolus. The thoracic aorta is tortuous with atherosclerosis. However right subclavian artery courses posterior to the esophagus, variant anatomy. Heart is normal in size. There are coronary artery calcifications. No pericardial effusion. Mediastinum/Nodes: Large hiatal hernia with majority of the stomach being intrathoracic. No esophageal wall thickening. No thyroid nodule. No mediastinal adenopathy. Small mediastinal lymph nodes are all subcentimeter short axis. Lungs/Pleura: Moderate bronchial thickening with areas of bronchial filling and mucoid impaction involving the right greater than left upper lobe, and right greater than left lower lobe. Perifissural opacity in the dependent right greater than left upper lobes. Dependent right lower lobe opacity with small pleural effusion. Compressive atelectasis in the left lower lobe adjacent to hiatal hernia. Minimal patchy and nodular right middle lobe opacity. Upper Abdomen: No acute findings in the included upper abdomen. Musculoskeletal: Multiple chronic thoracic vertebral compression fractures, with augmentation of T8 and T12. Similar additional compression fractures of T3, T6, and  T11. No new compression fracture or acute osseous abnormality. Review of the MIP images confirms the above findings. IMPRESSION: 1. No pulmonary embolus. 2. Moderate bronchial thickening with areas of bronchial filling and mucoid impaction involving the right greater than left upper lobe, and right greater than left lower lobe. Findings may be due to bronchitis or aspiration. 3. Dependent airspace opacities in the upper lobes and right lower lobe with small right effusion.  Pneumonia versus aspiration. Minimal patchy and nodular right middle lobe opacity is likely infectious. 4. Large hiatal hernia with majority of the stomach being intrathoracic. 5. Multiple chronic thoracic vertebral compression fractures. Aortic Atherosclerosis (ICD10-I70.0). Electronically Signed   By: Narda Rutherford M.D.   On: 03/05/2021 23:13   DG Chest Port 1 View  Result Date: 03/05/2021 CLINICAL DATA:  Hypoxia and possible sepsis.  Tachypnea. EXAM: PORTABLE CHEST 1 VIEW COMPARISON:  07/15/2020 FINDINGS: Atherosclerotic calcification of the aortic arch. Retrocardiac density corresponds in location to the location of a known moderate-sized hiatal hernia and accordingly may simply represent fluid-filled stomach and adjacent passive atelectasis. No blunting of the costophrenic angles. Thoracic spondylosis.  Prior lower thoracic vertebral augmentation. Degenerative glenohumeral arthropathy on the left. IMPRESSION: 1. Retrocardiac opacity probably represents fluid-filled hiatal hernia and adjacent passive atelectasis. 2.  Aortic Atherosclerosis (ICD10-I70.0). 3. Thoracic spondylosis. Electronically Signed   By: Gaylyn Rong M.D.   On: 03/05/2021 20:28   DG Swallowing Func-Speech Pathology  Result Date: 03/07/2021 Objective Swallowing Evaluation: Type of Study: MBS-Modified Barium Swallow Study  Patient Details Name: Gina Hodges MRN: [id number] Date of Birth: 08/07/1927 Today's Date: 03/07/2021 Time: SLP Start Time (ACUTE ONLY): 1305 -SLP Stop Time (ACUTE ONLY): 1330 SLP Time Calculation (min) (ACUTE ONLY): 25 min Past Medical History: Past Medical History: Diagnosis Date . Anemia  . Anxiety  . Cerebral infarct (HCC)  . Dementia (HCC)  . Dementia with behavioral problem (HCC)  . GERD (gastroesophageal reflux disease)  . Hyperlipidemia  . Osteoporosis  . PAF (paroxysmal atrial fibrillation) (HCC)  . Stroke (HCC)  . Thyroid disease  . Ulcerative esophagitis  . UTI (urinary tract infection)  . Vertigo  Past  Surgical History: Past Surgical History: Procedure Laterality Date . ABDOMINAL HYSTERECTOMY   . APPENDECTOMY   . CHOLECYSTECTOMY   HPI: Pt is a 87 yo female presenting with 2-3 days of SOB and admitted with sepsis secondary to PNA. CTA Chest showed moderate bronchial thickening with areas of bronchial filling and mucoid impaction that may be due to bronchitis or aspiration. Additional opacities are concerning for PNA vs aspiration and pt had a large HH with the majority of the stomach being intrathoracic. PMH also includes: dementia, afib, CVA, GERD, anxiety, thyroid disase, ulcerative esophagitis. MBS in 11/2019 revealed sensed penetration to the cords with thin and nectar thick liquids. Pt did have instances of normal swallowing with nectar thick liquids and they were more spontaneously cleared, so a diet of Dys 1 solids and nectar thick liquids was initiated.  Subjective: Pt alert and cooperative Assessment / Plan / Recommendation CHL IP CLINICAL IMPRESSIONS 03/07/2021 Clinical Impression Pt was seen for a modified barium swallow study and she presents with moderate oropharyngeal dysphagia c/b silent aspiration of thin liquid and nectar-thick liquid (PAS 8).  Aspiration occurred before and during the swallow secondary to delayed swallow initiation at the level of the pyriform sinus and delayed laryngeal closure.  Pt was able to clear some of the aspirated material with a cued cough.  Aspiration mostly occurred with large sips  of liquid; however, patient had difficulty following cues to take small sips.  Trace, shallow, transient laryngeal penetration (PAS 2) occurred x1 with both honey-thick liquid and puree, but it was not observed with additional trials of either consistency, and no aspiration occurred.  Oral phase was remarkable for decreased lingual control resulting in premature spillage to the pyriform sinus and decreased lingual strength resulting in trace oral residue.  Pharyngeal phase was remarkable for  decreased hyolaryngeal excursion resulting in trace-mild vallecular and pyriform residue.  Pharyngoesophageal phase was unremarkable.  Recommend Dysphagia 1 (puree) solids and honey-thick liquids with medications administered crushed in puree.  SLP Visit Diagnosis Dysphagia, oropharyngeal phase (R13.12) Attention and concentration deficit following -- Frontal lobe and executive function deficit following -- Impact on safety and function Moderate aspiration risk   CHL IP TREATMENT RECOMMENDATION 03/07/2021 Treatment Recommendations Therapy as outlined in treatment plan below   Prognosis 03/07/2021 Prognosis for Safe Diet Advancement Fair Barriers to Reach Goals Time post onset Barriers/Prognosis Comment -- CHL IP DIET RECOMMENDATION 03/07/2021 SLP Diet Recommendations Dysphagia 1 (Puree) solids;Honey thick liquids Liquid Administration via Cup;Straw Medication Administration Crushed with puree Compensations Minimize environmental distractions;Slow rate;Small sips/bites Postural Changes --   CHL IP OTHER RECOMMENDATIONS 03/07/2021 Recommended Consults -- Oral Care Recommendations Oral care BID Other Recommendations Order thickener from pharmacy;Prohibited food (jello, ice cream, thin soups);Remove water pitcher   CHL IP FOLLOW UP RECOMMENDATIONS 03/07/2021 Follow up Recommendations [facility] D[facility] 03/07/2021 Speech Therapy Frequency (ACUTE ONLY) min 2x/week Treatment Duration 2 weeks      CHL IP ORAL PHASE 03/07/2021 Oral Phase Impaired Oral - Pudding Teaspoon Premature spillage;Lingual/palatal residue Oral - Pudding Cup -- Oral - Honey Teaspoon -- Oral - Honey Cup Decreased bolus cohesion;Premature spillage;Piecemeal swallowing;Lingual/palatal residue Oral - Nectar Teaspoon -- Oral - Nectar Cup Premature spillage;Decreased bolus cohesion;Piecemeal swallowing;Lingual/palatal residue Oral - Nectar Straw Lingual/palatal residue;Piecemeal swallowing;Decreased bolus cohesion;Premature spillage  Oral - Thin Teaspoon Decreased bolus cohesion;Premature spillage;Piecemeal swallowing;Lingual/palatal residue Oral - Thin Cup Premature spillage;Decreased bolus cohesion;Piecemeal swallowing;Lingual/palatal residue Oral - Thin Straw Lingual/palatal residue;Piecemeal swallowing;Decreased bolus cohesion;Premature spillage Oral - Puree -- Oral - Mech Soft -- Oral - Regular -- Oral - Multi-Consistency -- Oral - Pill -- Oral Phase - Comment --  CHL IP PHARYNGEAL PHASE 03/07/2021 Pharyngeal Phase Impaired Pharyngeal- Pudding Teaspoon Delayed swallow initiation-pyriform sinuses;Penetration/Aspiration during swallow;Pharyngeal residue - valleculae;Reduced airway/laryngeal closure;Reduced anterior laryngeal mobility Pharyngeal Material enters airway, remains ABOVE vocal cords then ejected out Pharyngeal- Pudding Cup NT Pharyngeal -- Pharyngeal- Honey Teaspoon NT Pharyngeal -- Pharyngeal- Honey Cup Delayed swallow initiation-pyriform sinuses;Reduced anterior laryngeal mobility;Reduced airway/laryngeal closure;Penetration/Aspiration during swallow;Pharyngeal residue - valleculae Pharyngeal Material enters airway, remains ABOVE vocal cords then ejected out Pharyngeal- Nectar Teaspoon NT Pharyngeal -- Pharyngeal- Nectar Cup Delayed swallow initiation-pyriform sinuses;Penetration/Aspiration before swallow;Penetration/Aspiration during swallow;Penetration/Apiration after swallow;Trace aspiration;Pharyngeal residue - valleculae;Pharyngeal residue - pyriform;Reduced anterior laryngeal mobility;Reduced airway/laryngeal closure Pharyngeal Material enters airway, passes BELOW cords without attempt by patient to eject out (silent aspiration) Pharyngeal- Nectar Straw NT Pharyngeal -- Pharyngeal- Thin Teaspoon Delayed swallow initiation-pyriform sinuses;Reduced anterior laryngeal mobility;Reduced airway/laryngeal closure;Penetration/Aspiration before swallow;Penetration/Aspiration during swallow;Pharyngeal residue - valleculae Pharyngeal  Material enters airway, CONTACTS cords and not ejected out Pharyngeal- Thin Cup NT Pharyngeal -- Pharyngeal- Thin Straw Delayed swallow initiation-pyriform sinuses;Reduced airway/laryngeal closure;Reduced anterior laryngeal mobility;Penetration/Aspiration before swallow;Moderate aspiration;Pharyngeal residue - valleculae;Pharyngeal residue - pyriform Pharyngeal Material enters airway, passes BELOW cords without attempt by patient to eject out (silent aspiration) Pharyngeal- Puree -- Pharyngeal -- Pharyngeal- Mechanical Soft --  Pharyngeal -- Pharyngeal- Regular -- Pharyngeal -- Pharyngeal- Multi-consistency -- Pharyngeal -- Pharyngeal- Pill -- Pharyngeal -- Pharyngeal Comment --  CHL IP CERVICAL ESOPHAGEAL PHASE 03/07/2021 Cervical Esophageal Phase WFL Pudding Teaspoon -- Pudding Cup -- Honey Teaspoon -- Honey Cup -- Nectar Teaspoon -- Nectar Cup -- Nectar Straw -- Thin Teaspoon -- Thin Cup -- Thin Straw -- Puree -- Mechanical Soft -- Regular -- Multi-consistency -- Pill -- Cervical Esophageal Comment -- Villa Herb., M.S., CCC-SLP Acute Rehabilitation Services Office: [phone] Shanon Rosser [facility] 03/07/2021, 2:13 PM               (Echo, Carotid, EGD, Colonoscopy, ERCP)    Subjective: No complaints  Discharge Exam: Vitals:   03/09/21 0024 03/09/21 0510  BP: (!) 140/96 108/70  Pulse:    Resp: 18 (!) 21  Temp: 98.2 F (36.8 C) 97.6 F (36.4 C)  SpO2: 90% 93%   Vitals:   03/08/21 2000 03/09/21 0024 03/09/21 0500 03/09/21 0510  BP: (!) 142/99 (!) 140/96  108/70  Pulse:      Resp: 19 18  (!) 21  Temp: 98.6 F (37 C) 98.2 F (36.8 C)  97.6 F (36.4 C)  TempSrc: Oral Oral  Axillary  SpO2:  90%  93%  Weight:   66.2 kg   Height:        General: Pt is alert, awake, not in acute distress Cardiovascular: RRR, S1/S2 +, no rubs, no gallops Respiratory: CTA bilaterally, no wheezing, no rhonchi Abdominal: Soft, NT, ND, bowel sounds + Extremities: no edema, no cyanosis    The results of significant  diagnostics from this hospitalization (including imaging, microbiology, ancillary and laboratory) are listed below for reference.     Microbiology: Recent Results (from the past 240 hour(s))  SARS CORONAVIRUS 2 (TAT 6-24 HRS) Nasopharyngeal Nasopharyngeal Swab     Status: None   Collection Time: 03/05/21  7:48 PM   Specimen: Nasopharyngeal Swab  Result Value Ref Range Status   SARS Coronavirus 2 NEGATIVE NEGATIVE Final    Comment: (NOTE) SARS-CoV-2 target nucleic acids are NOT DETECTED.  The SARS-CoV-2 RNA is generally detectable in upper and lower respiratory specimens during the acute phase of infection. Negative results do not preclude SARS-CoV-2 infection, do not rule out co-infections with other pathogens, and should not be used as the sole basis for treatment or other patient management decisions. Negative results must be combined with clinical observations, patient history, and epidemiological information. The expected result is Negative.  Fact Sheet for Patients: HairSlick.no  Fact Sheet for Healthcare Providers: quierodirigir.com  This test is not yet approved or cleared by the [place] FDA and  has been authorized for detection and/or diagnosis of SARS-CoV-2 by FDA under an Emergency Use Authorization (EUA). This EUA will remain  in effect (meaning this test can be used) for the duration of the COVID-19 declaration under Se ction 564(b)(1) of the Act, 21 U.S.C. section 360bbb-3(b)(1), unless the authorization is terminated or revoked sooner.  Performed at [facility], [address], [place], [place] [address]   Urine culture     Status: Abnormal   Collection Time: 03/05/21  8:03 PM   Specimen: In/Out Cath Urine  Result Value Ref Range Status   Specimen Description IN/OUT CATH URINE  Final   Special Requests   Final    NONE Performed at [facility], [address], [place], [place]  [address]    Culture MULTIPLE SPECIES PRESENT, SUGGEST RECOLLECTION (A)  Final   Report Status 03/08/2021 FINAL  Final  Blood culture (routine single)     Status: None (Preliminary result)   Collection Time: 03/05/21  8:40 PM   Specimen: BLOOD  Result Value Ref Range Status   Specimen Description BLOOD LEFT ARM  Final   Special Requests   Final    BOTTLES DRAWN AEROBIC AND ANAEROBIC Blood Culture results may not be optimal due to an inadequate volume of blood received in culture bottles   Culture   Final    NO GROWTH 2 DAYS Performed at [facility], [address] [place], [place] [address]    Report Status PENDING  Incomplete  Resp Panel by RT-PCR (Flu A&B, Covid) Nasopharyngeal Swab     Status: None   Collection Time: 03/05/21 11:32 PM   Specimen: Nasopharyngeal Swab; Nasopharyngeal(NP) swabs in vial transport medium  Result Value Ref Range Status   SARS Coronavirus 2 by RT PCR NEGATIVE NEGATIVE Final    Comment: (NOTE) SARS-CoV-2 target nucleic acids are NOT DETECTED.  The SARS-CoV-2 RNA is generally detectable in upper respiratory specimens during the acute phase of infection. The lowest concentration of SARS-CoV-2 viral copies this assay can detect is 138 copies/mL. A negative result does not preclude SARS-Cov-2 infection and should not be used as the sole basis for treatment or other patient management decisions. A negative result may occur with  improper specimen collection/handling, submission of specimen other than nasopharyngeal swab, presence of viral mutation(s) within the areas targeted by this assay, and inadequate number of viral copies(<138 copies/mL). A negative result must be combined with clinical observations, patient history, and epidemiological information. The expected result is Negative.  Fact Sheet for Patients:  BloggerCourse.com  Fact Sheet for Healthcare Providers:  SeriousBroker.it  This test  is no t yet approved or cleared by the [place] FDA and  has been authorized for detection and/or diagnosis of SARS-CoV-2 by FDA under an Emergency Use Authorization (EUA). This EUA will remain  in effect (meaning this test can be used) for the duration of the COVID-19 declaration under Section 564(b)(1) of the Act, 21 U.S.C.section 360bbb-3(b)(1), unless the authorization is terminated  or revoked sooner.       Influenza A by PCR NEGATIVE NEGATIVE Final   Influenza B by PCR NEGATIVE NEGATIVE Final    Comment: (NOTE) The Xpert Xpress SARS-CoV-2/FLU/RSV plus assay is intended as an aid in the diagnosis of influenza from Nasopharyngeal swab specimens and should not be used as a sole basis for treatment. Nasal washings and aspirates are unacceptable for Xpert Xpress SARS-CoV-2/FLU/RSV testing.  Fact Sheet for Patients: BloggerCourse.com  Fact Sheet for Healthcare Providers: SeriousBroker.it  This test is not yet approved or cleared by the [place] FDA and has been authorized for detection and/or diagnosis of SARS-CoV-2 by FDA under an Emergency Use Authorization (EUA). This EUA will remain in effect (meaning this test can be used) for the duration of the COVID-19 declaration under Section 564(b)(1) of the Act, 21 U.S.C. section 360bbb-3(b)(1), unless the authorization is terminated or revoked.  Performed at [facility], [address] [place], [place] [address]      Labs: BNP (last 3 results) Recent Labs    07/15/20 1505  BNP 141.6*   Basic Metabolic Panel: Recent Labs  Lab 03/05/21 2003 03/05/21 2057 03/06/21 0410 03/07/21 0305 03/08/21 0345  NA 133* 133* 135 134* 136  K 3.6 4.0 3.6 3.8 2.9*  CL 101  --  102  103 104  CO2 24  --  24 19* 26  GLUCOSE 109*  --  115* 119* 107*  BUN 16  --  11 8 5*  CREATININE 0.79  --  0.75 0.58 0.62  CALCIUM 8.1*  --  8.5* 8.5* 8.8*  MG  --   --  1.8  --   --    PHOS  --   --  2.8  --   --    Liver Function Tests: Recent Labs  Lab 03/05/21 2003  AST 17  ALT 5  ALKPHOS 75  BILITOT 0.6  PROT 5.6*  ALBUMIN 2.7*   No results for input(s): LIPASE, AMYLASE in the last 168 hours. No results for input(s): AMMONIA in the last 168 hours. CBC: Recent Labs  Lab 03/05/21 2003 03/05/21 2057 03/06/21 0410 03/07/21 0305 03/08/21 0345  WBC 11.9*  --  10.8* 8.8 7.6  NEUTROABS 7.6  --   --   --   --   HGB 10.0* 10.5* 10.1* 9.9* 9.5*  HCT 31.5* 31.0* 32.7* 31.7* 30.1*  MCV 94.0  --  95.3 93.5 93.2  PLT 300  --  294 310 337   Cardiac Enzymes: No results for input(s): CKTOTAL, CKMB, CKMBINDEX, TROPONINI in the last 168 hours. BNP: Invalid input(s): POCBNP CBG: Recent Labs  Lab 03/07/21 0620 03/07/21 2247  GLUCAP 127* 111*   D-Dimer No results for input(s): DDIMER in the last 72 hours. Hgb A1c No results for input(s): HGBA1C in the last 72 hours. Lipid Profile No results for input(s): CHOL, HDL, LDLCALC, TRIG, CHOLHDL, LDLDIRECT in the last 72 hours. Thyroid function studies No results for input(s): TSH, T4TOTAL, T3FREE, THYROIDAB in the last 72 hours.  Invalid input(s): FREET3 Anemia work up No results for input(s): VITAMINB12, FOLATE, FERRITIN, TIBC, IRON, RETICCTPCT in the last 72 hours. Urinalysis    Component Value Date/Time   COLORURINE YELLOW 03/06/2021 0157   APPEARANCEUR HAZY (A) 03/06/2021 0157   LABSPEC 1.028 03/06/2021 0157   PHURINE 7.0 03/06/2021 0157   GLUCOSEU NEGATIVE 03/06/2021 0157   HGBUR NEGATIVE 03/06/2021 0157   BILIRUBINUR NEGATIVE 03/06/2021 0157   KETONESUR NEGATIVE 03/06/2021 0157   PROTEINUR NEGATIVE 03/06/2021 0157   NITRITE NEGATIVE 03/06/2021 0157   LEUKOCYTESUR LARGE (A) 03/06/2021 0157   Sepsis Labs Invalid input(s): PROCALCITONIN,  WBC,  LACTICIDVEN Microbiology Recent Results (from the past 240 hour(s))  SARS CORONAVIRUS 2 (TAT 6-24 HRS) Nasopharyngeal Nasopharyngeal Swab     Status: None    Collection Time: 03/05/21  7:48 PM   Specimen: Nasopharyngeal Swab  Result Value Ref Range Status   SARS Coronavirus 2 NEGATIVE NEGATIVE Final    Comment: (NOTE) SARS-CoV-2 target nucleic acids are NOT DETECTED.  The SARS-CoV-2 RNA is generally detectable in upper and lower respiratory specimens during the acute phase of infection. Negative results do not preclude SARS-CoV-2 infection, do not rule out co-infections with other pathogens, and should not be used as the sole basis for treatment or other patient management decisions. Negative results must be combined with clinical observations, patient history, and epidemiological information. The expected result is Negative.  Fact Sheet for Patients: HairSlick.no  Fact Sheet for Healthcare Providers: quierodirigir.com  This test is not yet approved or cleared by the [place] FDA and  has been authorized for detection and/or diagnosis of SARS-CoV-2 by FDA under an Emergency Use Authorization (EUA). This EUA will remain  in effect (meaning this test can be used) for the duration of the COVID-19 declaration under  Se ction 564(b)(1) of the Act, 21 U.S.C. section 360bbb-3(b)(1), unless the authorization is terminated or revoked sooner.  Performed at [facility], [address], [place], [place] [address]   Urine culture     Status: Abnormal   Collection Time: 03/05/21  8:03 PM   Specimen: In/Out Cath Urine  Result Value Ref Range Status   Specimen Description IN/OUT CATH URINE  Final   Special Requests   Final    NONE Performed at [facility], [address], [place], [place] [address]    Culture MULTIPLE SPECIES PRESENT, SUGGEST RECOLLECTION (A)  Final   Report Status 03/08/2021 FINAL  Final  Blood culture (routine single)     Status: None (Preliminary result)   Collection Time: 03/05/21  8:40 PM   Specimen: BLOOD  Result Value Ref Range Status    Specimen Description BLOOD LEFT ARM  Final   Special Requests   Final    BOTTLES DRAWN AEROBIC AND ANAEROBIC Blood Culture results may not be optimal due to an inadequate volume of blood received in culture bottles   Culture   Final    NO GROWTH 2 DAYS Performed at [facility], [address] [place], [place] [address]    Report Status PENDING  Incomplete  Resp Panel by RT-PCR (Flu A&B, Covid) Nasopharyngeal Swab     Status: None   Collection Time: 03/05/21 11:32 PM   Specimen: Nasopharyngeal Swab; Nasopharyngeal(NP) swabs in vial transport medium  Result Value Ref Range Status   SARS Coronavirus 2 by RT PCR NEGATIVE NEGATIVE Final    Comment: (NOTE) SARS-CoV-2 target nucleic acids are NOT DETECTED.  The SARS-CoV-2 RNA is generally detectable in upper respiratory specimens during the acute phase of infection. The lowest concentration of SARS-CoV-2 viral copies this assay can detect is 138 copies/mL. A negative result does not preclude SARS-Cov-2 infection and should not be used as the sole basis for treatment or other patient management decisions. A negative result may occur with  improper specimen collection/handling, submission of specimen other than nasopharyngeal swab, presence of viral mutation(s) within the areas targeted by this assay, and inadequate number of viral copies(<138 copies/mL). A negative result must be combined with clinical observations, patient history, and epidemiological information. The expected result is Negative.  Fact Sheet for Patients:  BloggerCourse.com  Fact Sheet for Healthcare Providers:  SeriousBroker.it  This test is no t yet approved or cleared by the [place] FDA and  has been authorized for detection and/or diagnosis of SARS-CoV-2 by FDA under an Emergency Use Authorization (EUA). This EUA will remain  in effect (meaning this test can be used) for the duration of the COVID-19  declaration under Section 564(b)(1) of the Act, 21 U.S.C.section 360bbb-3(b)(1), unless the authorization is terminated  or revoked sooner.       Influenza A by PCR NEGATIVE NEGATIVE Final   Influenza B by PCR NEGATIVE NEGATIVE Final    Comment: (NOTE) The Xpert Xpress SARS-CoV-2/FLU/RSV plus assay is intended as an aid in the diagnosis of influenza from Nasopharyngeal swab specimens and should not be used as a sole basis for treatment. Nasal washings and aspirates are unacceptable for Xpert Xpress SARS-CoV-2/FLU/RSV testing.  Fact Sheet for Patients: BloggerCourse.com  Fact Sheet for Healthcare Providers: SeriousBroker.it  This test is not yet approved or cleared by the [place] FDA and has been authorized for detection and/or diagnosis of SARS-CoV-2 by FDA under an Emergency Use Authorization (EUA). This EUA will  remain in effect (meaning this test can be used) for the duration of the COVID-19 declaration under Section 564(b)(1) of the Act, 21 U.S.C. section 360bbb-3(b)(1), unless the authorization is terminated or revoked.  Performed at [facility], [address], [place], [place] [address]      Time coordinating discharge: Over 30 minutes  SIGNED:   Marinda Elk, MD  [facility] 03/09/2021, 7:57 AM Pager   If 7PM-7AM, please contact night-coverage [email or web address] Password [email or web address]

## 2021-03-09 NOTE — [place] FL2 (Addendum)
[place] MEDICAID FL2 LEVEL OF CARE SCREENING TOOL     IDENTIFICATION  Patient Name: Gina Hodges Birthdate: [birth year] Sex: female Admission Date (Current Location): 03/05/2021  [facility] and [place] Number:  Producer, television/film/video and Address:  [facility] [place], [address], [place], [place] [address]      Provider Number: [id number]  Attending Physician Name and Address:  Marinda Elk, MD  Relative Name and Phone Number:  Kathlen Brunswick [facility])   [phone]    Current Level of Care: Hospital Recommended Level of Care: Assisted Living Facility [facility]) Prior Approval Number:    Date Approved/Denied:   PASRR Number:    Discharge Plan: Other (Comment) (ALF)    Current Diagnoses: Patient Active Problem List   Diagnosis Date Noted  . Multifocal pneumonia 03/06/2021  . Sepsis (HCC) 11/10/2019  . AKI (acute kidney injury) (HCC) 11/10/2019  . Fall 08/13/2019  . Ingrown nail 11/12/2013  . Compression fracture of spine (HCC) 11/07/2013  . Hypothyroidism 11/07/2013  . Hyperlipidemia   . Dementia with behavioral problem (HCC)   . GERD (gastroesophageal reflux disease)   . PAF (paroxysmal atrial fibrillation) (HCC)   . Generalized anxiety disorder 04/05/2013    Orientation RESPIRATION BLADDER Height & Weight        O2 (2L Nasal Cannula) Incontinent,External catheter Weight: 145 lb 15.1 oz (66.2 kg) Height:  5\' 2"  (157.5 cm)  BEHAVIORAL SYMPTOMS/MOOD NEUROLOGICAL BOWEL NUTRITION STATUS      Incontinent Diet (Diet - low sodium heart healthy)  AMBULATORY STATUS COMMUNICATION OF NEEDS Skin   Extensive Assist Verbally                         Personal Care Assistance Level of Assistance  Bathing,Feeding,Dressing Bathing Assistance: Maximum assistance Feeding assistance: Maximum assistance Dressing Assistance: Maximum assistance     Functional Limitations Info  Speech,Sight,Hearing Sight Info: Impaired Hearing Info:  Adequate Speech Info: Adequate    SPECIAL CARE FACTORS FREQUENCY  PT (By licensed PT),OT (By licensed OT)     PT Frequency: 3x a week OT Frequency: 3x a week            Contractures Contractures Info: Not present    Additional Factors Info  Code Status,Allergies Code Status Info: DNR Allergies Info: Penicillins   Bactrim (Sulfamethoxazole-trimethoprim)   Sulfa Antibiotics   Codeine           Current Medications (03/09/2021):  This is the current hospital active medication list Current Facility-Administered Medications  Medication Dose Route Frequency Provider Last Rate Last Admin  . albuterol (PROVENTIL) (2.5 MG/3ML) 0.083% nebulizer solution 2.5 mg  2.5 mg Nebulization Q4H PRN 05/09/2021, MD   2.5 mg at 03/09/21 0529  . ALPRAZolam 05/09/21) tablet 0.25 mg  0.25 mg Oral BID PRN Prudy Feeler N, DO   0.25 mg at 03/07/21 2021  . amoxicillin-clavulanate (AUGMENTIN) 875-125 MG per tablet 1 tablet  1 tablet Oral Q12H 2022, MD   1 tablet at 03/09/21 [phone]  . atorvastatin (LIPITOR) tablet 5 mg  5 mg Oral QHS Hall, Carole N, DO   5 mg at 03/08/21 2048  . benzonatate (TESSALON) capsule 100 mg  100 mg Oral TID PRN 2049, MD      . enoxaparin (LOVENOX) injection 40 mg  40 mg Subcutaneous Daily Marinda Elk N, DO   40 mg at 03/09/21 05/09/21  . guaiFENesin (MUCINEX) 12 hr tablet 600 mg  600 mg Oral BID Maretta Bees, MD   600 mg at 03/09/21 1962  . levothyroxine (SYNTHROID) tablet 75 mcg  75 mcg Oral QAC breakfast Darlin Drop, DO   75 mcg at 03/09/21 2297  . melatonin tablet 10 mg  10 mg Oral QHS Dow Adolph N, DO   10 mg at 03/08/21 2048  . metoprolol tartrate (LOPRESSOR) injection 2.5 mg  2.5 mg Intravenous Q6H PRN Dow Adolph N, DO      . mirtazapine (REMERON) tablet 7.5 mg  7.5 mg Oral QHS Hall, Carole N, DO   7.5 mg at 03/08/21 2048  . multivitamin with minerals tablet 1 tablet  1 tablet Oral Daily Marinda Elk, MD   1 tablet at 03/09/21  0827  . pantoprazole (PROTONIX) EC tablet 40 mg  40 mg Oral Daily Dow Adolph N, DO   40 mg at 03/09/21 0830  . venlafaxine XR (EFFEXOR-XR) 24 hr capsule 75 mg  75 mg Oral Q breakfast Dow Adolph N, DO   75 mg at 03/09/21 0830     Discharge Medications: Please see discharge summary for a list of discharge medications.  Relevant Imaging Results:  Relevant Lab Results:   Additional Information SSN: [id number] [place]

## 2021-03-09 NOTE — TOC Transition Note (Signed)
Transition of Care [facility]) - CM/SW Discharge Note   Patient Details  Name: Gina Hodges MRN: [id number] Date of Birth: January 07, 1927  [facility] CM/SW Contact:  Lynett Grimes Phone Number: 03/09/2021, 4:03 PM   Clinical Narrative:    Patient will DC to: [facility] [address]  Anticipated DC date: 03/09/2021 Family notified: Pt nephew  Transport by: Sharin Mons   Per MD patient ready for DC to [facility]. RN to call report prior to discharge [phone]). RN, patient, patient's family, and facility notified of DC. Discharge Summary and FL2 sent to facility. DC packet on chart. Ambulance transport requested for patient.   CSW will sign off for now as social work intervention is no longer needed. Please consult [place] again if new needs arise.        Barriers to Discharge: Continued Medical Work up   Patient Goals and CMS Choice        Discharge Placement                       Discharge Plan and Services                                     Social Determinants of Health (SDOH) Interventions     Readmission Risk Interventions Readmission Risk Prevention Plan 11/12/2019  Post Dischage Appt Not Complete  Appt Comments facility resident  Medication Screening Complete  Transportation Screening Complete  Some recent data might be hidden

## 2021-03-11 LAB — CULTURE, BLOOD (SINGLE): Culture: NO GROWTH

## 2021-03-14 ENCOUNTER — Emergency Department (HOSPITAL_COMMUNITY): Payer: Medicare HMO

## 2021-03-14 ENCOUNTER — Other Ambulatory Visit: Payer: Self-pay

## 2021-03-14 ENCOUNTER — Encounter (HOSPITAL_COMMUNITY): Payer: Self-pay

## 2021-03-14 ENCOUNTER — Emergency Department (HOSPITAL_COMMUNITY)
Admission: EM | Admit: 2021-03-14 | Discharge: 2021-03-14 | Disposition: A | Discharge status: Home / Self Care | Payer: Medicare HMO | Attending: Emergency Medicine | Admitting: Emergency Medicine

## 2021-03-14 DIAGNOSIS — E039 Hypothyroidism, unspecified: Secondary | ICD-10-CM | POA: Insufficient documentation

## 2021-03-14 DIAGNOSIS — Z79899 Other long term (current) drug therapy: Secondary | ICD-10-CM | POA: Insufficient documentation

## 2021-03-14 DIAGNOSIS — R1110 Vomiting, unspecified: Secondary | ICD-10-CM | POA: Diagnosis not present

## 2021-03-14 DIAGNOSIS — Z20822 Contact with and (suspected) exposure to covid-19: Secondary | ICD-10-CM | POA: Insufficient documentation

## 2021-03-14 DIAGNOSIS — F0391 Unspecified dementia with behavioral disturbance: Secondary | ICD-10-CM | POA: Diagnosis not present

## 2021-03-14 DIAGNOSIS — R410 Disorientation, unspecified: Secondary | ICD-10-CM | POA: Diagnosis not present

## 2021-03-14 DIAGNOSIS — R197 Diarrhea, unspecified: Secondary | ICD-10-CM | POA: Diagnosis present

## 2021-03-14 DIAGNOSIS — R10817 Generalized abdominal tenderness: Secondary | ICD-10-CM | POA: Diagnosis not present

## 2021-03-14 LAB — COMPREHENSIVE METABOLIC PANEL
ALT: 76 U/L — ABNORMAL HIGH (ref 0–44)
AST: 143 U/L — ABNORMAL HIGH (ref 15–41)
Albumin: 3.1 g/dL — ABNORMAL LOW (ref 3.5–5.0)
Alkaline Phosphatase: 342 U/L — ABNORMAL HIGH (ref 38–126)
Anion gap: 8 (ref 5–15)
BUN: 13 mg/dL (ref 8–23)
CO2: 27 mmol/L (ref 22–32)
Calcium: 9.1 mg/dL (ref 8.9–10.3)
Chloride: 106 mmol/L (ref 98–111)
Creatinine, Ser: 0.82 mg/dL (ref 0.44–1.00)
GFR, Estimated: >60 mL/min (ref >60)
Glucose, Bld: 105 mg/dL — ABNORMAL HIGH (ref 70–99)
Potassium: 3.6 mmol/L (ref 3.5–5.1)
Sodium: 141 mmol/L (ref 135–145)
Total Bilirubin: 0.5 mg/dL (ref 0.3–1.2)
Total Protein: 6.3 g/dL — ABNORMAL LOW (ref 6.5–8.1)

## 2021-03-14 LAB — TSH: TSH: 1.046 u[IU]/mL (ref 0.350–4.500)

## 2021-03-14 LAB — URINALYSIS, ROUTINE W REFLEX MICROSCOPIC
Bacteria, UA: NONE SEEN
Bilirubin Urine: NEGATIVE
Glucose, UA: NEGATIVE mg/dL
Hgb urine dipstick: NEGATIVE
Ketones, ur: 5 mg/dL — AB
Leukocytes,Ua: NEGATIVE
Nitrite: NEGATIVE
Protein, ur: 30 mg/dL — AB
Specific Gravity, Urine: >1.046 — ABNORMAL HIGH (ref 1.005–1.030)
pH: 6 (ref 5.0–8.0)

## 2021-03-14 LAB — C DIFFICILE QUICK SCREEN W PCR REFLEX
C Diff antigen: NEGATIVE
C Diff interpretation: NOT DETECTED
C Diff toxin: NEGATIVE

## 2021-03-14 LAB — CBC WITH DIFFERENTIAL/PLATELET
Abs Immature Granulocytes: 0.13 10*3/uL — ABNORMAL HIGH (ref 0.00–0.07)
Basophils Absolute: 0.1 10*3/uL (ref 0.0–0.1)
Basophils Relative: 1 %
Eosinophils Absolute: 1.6 10*3/uL — ABNORMAL HIGH (ref 0.0–0.5)
Eosinophils Relative: 14 %
HCT: 42.4 % (ref 36.0–46.0)
Hemoglobin: 12.7 g/dL (ref 12.0–15.0)
Immature Granulocytes: 1 %
Lymphocytes Relative: 26 %
Lymphs Abs: 3 10*3/uL (ref 0.7–4.0)
MCH: 28.9 pg (ref 26.0–34.0)
MCHC: 30 g/dL (ref 30.0–36.0)
MCV: 96.4 fL (ref 80.0–100.0)
Monocytes Absolute: 0.8 10*3/uL (ref 0.1–1.0)
Monocytes Relative: 7 %
Neutro Abs: 5.8 10*3/uL (ref 1.7–7.7)
Neutrophils Relative %: 51 %
Platelets: 535 10*3/uL — ABNORMAL HIGH (ref 150–400)
RBC: 4.4 MIL/uL (ref 3.87–5.11)
RDW: 14.4 % (ref 11.5–15.5)
WBC: 11.4 10*3/uL — ABNORMAL HIGH (ref 4.0–10.5)
nRBC: 0 % (ref 0.0–0.2)

## 2021-03-14 LAB — LACTIC ACID, PLASMA
Lactic Acid, Venous: 2.4 mmol/L (ref 0.5–1.9)
Lactic Acid, Venous: 1.9 mmol/L (ref 0.5–1.9)

## 2021-03-14 LAB — PROTIME-INR
INR: 1 (ref 0.8–1.2)
Prothrombin Time: 13.3 seconds (ref 11.4–15.2)

## 2021-03-14 LAB — RESP PANEL BY RT-PCR (FLU A&B, COVID) ARPGX2
Influenza A by PCR: NEGATIVE
Influenza B by PCR: NEGATIVE
SARS Coronavirus 2 by RT PCR: NEGATIVE

## 2021-03-14 LAB — APTT: aPTT: 26 seconds (ref 24–36)

## 2021-03-14 MED ORDER — IOHEXOL 300 MG/ML  SOLN
75.0000 mL | Freq: Once | INTRAMUSCULAR | Status: AC | PRN
  Administered 2021-03-14: 75 mL via INTRAVENOUS

## 2021-03-14 MED ORDER — SODIUM CHLORIDE 0.9 % IV BOLUS
500.0000 mL | Freq: Once | INTRAVENOUS | Status: AC
  Administered 2021-03-14: 500 mL via INTRAVENOUS

## 2021-03-14 NOTE — ED Provider Notes (Signed)
[facility] EMERGENCY DEPARTMENT Provider Note   CSN: [id number] Arrival date & time: 03/14/21  5102     History Chief Complaint  Patient presents with   Altered Mental Status    Gina Hodges is a 87 y.o. female.  Patient sent to the emergency department from nursing home for vomiting and diarrhea.  Patient has been experiencing increased confusion, decreased activity with vomiting and diarrhea for the last 2 days.  Patient recently hospitalized for pneumonia, is on Augmentin.  Level 5 caveat due to altered mental status.      Past Medical History:  Diagnosis Date   Anemia    Anxiety    Cerebral infarct (HCC)    Dementia (HCC)    Dementia with behavioral problem (HCC)    GERD (gastroesophageal reflux disease)    Hyperlipidemia    Osteoporosis    PAF (paroxysmal atrial fibrillation) (HCC)    Stroke (HCC)    Thyroid disease    Ulcerative esophagitis    UTI (urinary tract infection)    Vertigo     Patient Active Problem List   Diagnosis Date Noted   Multifocal pneumonia 03/06/2021   Sepsis (HCC) 11/10/2019   AKI (acute kidney injury) (HCC) 11/10/2019   Fall 08/13/2019   Ingrown nail 11/12/2013   Compression fracture of spine (HCC) 11/07/2013   Hypothyroidism 11/07/2013   Hyperlipidemia    Dementia with behavioral problem (HCC)    GERD (gastroesophageal reflux disease)    PAF (paroxysmal atrial fibrillation) (HCC)    Generalized anxiety disorder 04/05/2013    Past Surgical History:  Procedure Laterality Date   ABDOMINAL HYSTERECTOMY     APPENDECTOMY     CHOLECYSTECTOMY       OB History   No obstetric history on file.     Family History  Problem Relation Age of Onset   Depression Mother    Bipolar disorder Daughter    Drug abuse Son     Social History   Tobacco Use   Smoking status: Never   Smokeless tobacco: Never  Substance Use Topics   Alcohol use: No   Drug use: No    Home Medications Prior to Admission medications    Medication Sig Start Date End Date Taking? Authorizing Provider  acetaminophen (TYLENOL) 650 MG CR tablet Take 650 mg by mouth 3 (three) times daily.   Yes [provider]  ALPRAZolam Prudy Feeler) 0.5 MG tablet Take 0.5 mg by mouth 2 (two) times daily.   Yes [provider]  alum & mag hydroxide-simeth (MAALOX/MYLANTA) 200-200-20 MG/5ML suspension Take 30 mLs by mouth every 8 (eight) hours as needed for indigestion or heartburn.    Yes [provider]  amoxicillin-clavulanate (AUGMENTIN) 875-125 MG tablet Take 1 tablet by mouth every 12 (twelve) hours. 03/09/21  Yes Marinda Elk, MD  atorvastatin (LIPITOR) 10 MG tablet Take 5 mg by mouth at bedtime.   Yes [provider]  Calcium Carbonate-Vitamin D 600-400 MG-UNIT tablet Take 1 tablet by mouth 2 (two) times daily.    Yes [provider]  levothyroxine (SYNTHROID, LEVOTHROID) 75 MCG tablet Take 75 mcg by mouth daily before breakfast.  02/27/13  Yes [provider]  Lidocaine 4 % PTCH Apply 1 patch topically See admin instructions. Apply 1 patch to affected area(s) two times a day and remove, per schedule   Yes [provider]  loperamide (IMODIUM) 2 MG capsule Take 2-4 mg by mouth See admin instructions. Take 2 capsules (4 mg) by  mouth at first loose stool, then take 1 capsule (2 mg) every 2 hours as needed for diarrhea/loose stools 11/08/18  Yes [provider]  loratadine (CLARITIN) 10 MG tablet Take 10 mg by mouth daily.   Yes [provider]  Melatonin 10 MG TABS Take 10 mg by mouth at bedtime.    Yes [provider]  meloxicam (MOBIC) 15 MG tablet Take 15 mg by mouth daily. 10/30/18  Yes [provider]  mirtazapine (REMERON) 15 MG tablet Take 15 mg by mouth at bedtime. 03/03/21  Yes [provider]  Multiple Vitamin (MULTIVITAMIN WITH MINERALS) TABS tablet Take 1 tablet by mouth daily.    Yes [provider]  omeprazole (PRILOSEC)  20 MG capsule Take 20 mg by mouth 2 (two) times daily. 10/17/18  Yes [provider]  OXYGEN Inhale 2 L into the lungs continuous.   Yes [provider]  polyethylene glycol (MIRALAX / GLYCOLAX) packet Take 17 g by mouth daily as needed for mild constipation (MIX AND DRINK).    Yes [provider]  senna (SENOKOT) 8.6 MG TABS tablet Take 1 tablet by mouth daily.   Yes [provider]  traZODone (DESYREL) 150 MG tablet Take 75 mg by mouth at bedtime. 10/27/18  Yes [provider]  venlafaxine XR (EFFEXOR-XR) 75 MG 24 hr capsule Take 75 mg by mouth daily with breakfast.   Yes [provider]    Allergies    Penicillins, Bactrim [sulfamethoxazole-trimethoprim], Sulfa antibiotics, and Codeine  Review of Systems   Review of Systems  Unable to perform ROS: Mental status change   Physical Exam Updated Vital Signs BP 137/71   Pulse 98   Temp 98.8 F (37.1 C) (Rectal)   Resp 14   Ht 5\' 2"  (1.575 m)   Wt 66.2 kg   SpO2 100%   BMI 26.69 kg/m   Physical Exam Vitals and nursing note reviewed.  Constitutional:      General: She is not in acute distress.    Appearance: Normal appearance. She is well-developed.  HENT:     Head: Normocephalic and atraumatic.     Right Ear: Hearing normal.     Left Ear: Hearing normal.     Nose: Nose normal.  Eyes:     Conjunctiva/sclera: Conjunctivae normal.     Pupils: Pupils are equal, round, and reactive to light.  Cardiovascular:     Rate and Rhythm: Regular rhythm.     Heart sounds: S1 normal and S2 normal. No murmur heard.   No friction rub. No gallop.  Pulmonary:     Effort: Pulmonary effort is normal. No respiratory distress.     Breath sounds: Normal breath sounds.  Chest:     Chest wall: No tenderness.  Abdominal:     General: Bowel sounds are normal.     Palpations: Abdomen is soft.     Tenderness: There is generalized abdominal tenderness. There is no guarding or rebound. Negative  signs include Murphy's sign and McBurney's sign.     Hernia: No hernia is present.  Musculoskeletal:        General: Normal range of motion.     Cervical back: Normal range of motion and neck supple.  Skin:    General: Skin is warm and dry.     Findings: No rash.  Neurological:     Mental Status: She is alert and oriented to person, place, and time.     GCS: GCS eye subscore is  4. GCS verbal subscore is 5. GCS motor subscore is 6.     Cranial Nerves: No cranial nerve deficit.     Sensory: No sensory deficit.     Coordination: Coordination normal.  Psychiatric:        Speech: Speech normal.        Behavior: Behavior normal.        Thought Content: Thought content normal.    ED Results / Procedures / Treatments   Labs (all labs ordered are listed, but only abnormal results are displayed) Labs Reviewed  LACTIC ACID, PLASMA - Abnormal; Notable for the following components:      Result Value   Lactic Acid, Venous 2.4 (*)    All other components within normal limits  COMPREHENSIVE METABOLIC PANEL - Abnormal; Notable for the following components:   Glucose, Bld 105 (*)    Total Protein 6.3 (*)    Albumin 3.1 (*)    AST 143 (*)    ALT 76 (*)    Alkaline Phosphatase 342 (*)    All other components within normal limits  CBC WITH DIFFERENTIAL/PLATELET - Abnormal; Notable for the following components:   WBC 11.4 (*)    Platelets 535 (*)    Eosinophils Absolute 1.6 (*)    Abs Immature Granulocytes 0.13 (*)    All other components within normal limits  URINALYSIS, ROUTINE W REFLEX MICROSCOPIC - Abnormal; Notable for the following components:   Color, Urine AMBER (*)    APPearance HAZY (*)    Specific Gravity, Urine >1.046 (*)    Ketones, ur 5 (*)    Protein, ur 30 (*)    All other components within normal limits  C DIFFICILE QUICK SCREEN W PCR REFLEX    RESP PANEL BY RT-PCR (FLU A&B, COVID) [id number]  CULTURE, BLOOD (SINGLE)  URINE CULTURE  LACTIC ACID, PLASMA  PROTIME-INR  APTT   TSH    EKG EKG Interpretation  Date/Time:  Saturday March 14 2021 09:15:57 EDT Ventricular Rate:  91 PR Interval:    QRS Duration: 91 QT Interval:  355 QTC Calculation: 437 R Axis:   -23 Text Interpretation: Atrial fibrillation Borderline left axis deviation Anterior infarct, old Nonspecific T abnormalities, lateral leads Confirmed by Gilda CreasePollina, Blue Winther J [phone]) on 03/14/2021 9:40:23 AM  Radiology CT HEAD WO CONTRAST  Result Date: 03/14/2021 CLINICAL DATA:  Mental status change EXAM: CT HEAD WITHOUT CONTRAST TECHNIQUE: Contiguous axial images were obtained from the base of the skull through the vertex without intravenous contrast. COMPARISON:  CT scan of the head 11/14/2020 FINDINGS: Brain: No evidence of acute infarction, hemorrhage, hydrocephalus, extra-axial collection or mass lesion/mass effect. 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 Remote lacunar right cerebellar infarct. Vascular: No hyperdense vessel or unexpected calcification. Skull: Normal. Negative for fracture or focal lesion. Sinuses/Orbits: No acute finding. Other: None. IMPRESSION: 1. No acute intracranial abnormality. 2. 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 Electronically Signed   By: Malachy MoanHeath  McCullough M.D.   On: 03/14/2021 10:35   CT ABDOMEN PELVIS W CONTRAST  Result Date: 03/14/2021 CLINICAL DATA:  Abdominal pain. EXAM: CT ABDOMEN AND PELVIS WITH CONTRAST TECHNIQUE: Multidetector CT imaging of the abdomen and pelvis was performed using the standard protocol following bolus administration of intravenous contrast. CONTRAST:  75mL OMNIPAQUE IOHEXOL 300 MG/ML  SOLN COMPARISON:  CT scan 11/10/2019 FINDINGS: Lower chest: Small  to moderate-sized right pleural effusion is noted with overlying atelectasis. The heart is normal in size for age.  No pericardial effusion. Large hiatal hernia. Hepatobiliary: Stable low-attenuation lesion in segment 7 of the liver is likely a benign cyst. No worrisome hepatic lesions. The gallbladder is surgically absent. There is stable mild central intrahepatic and common bile duct dilatation. Pancreas: No mass, inflammation or ductal dilatation. Moderate stable diffuse pancreatic atrophy. Spleen: Normal size.  No focal lesions. Adrenals/Urinary Tract: Adrenal glands are normal. Bilateral renal cysts are stable. No worrisome renal lesions or hydronephrosis. The bladder is unremarkable. Stomach/Bowel: The stomach, duodenum, small bowel and colon are grossly normal. No acute inflammatory changes, mass lesions or obstructive findings. Vascular/Lymphatic: Stable aortic calcifications but no aneurysm or dissection. The branch vessels are patent. The major venous structures are patent. No mesenteric or retroperitoneal mass or adenopathy. Reproductive: Surgically absent. Other: No pelvic mass or adenopathy. No free pelvic fluid collections. No inguinal mass or adenopathy. No abdominal wall hernia or subcutaneous lesions. Musculoskeletal: No significant bony findings. Remote vertebral augmentation changes are noted the mid and lower thoracic spine. IMPRESSION: 1. No acute abdominal/pelvic findings, mass lesions or adenopathy. 2. Small to moderate-sized right pleural effusion with overlying atelectasis. 3. Large hiatal hernia. 4. Status post cholecystectomy with stable mild central intrahepatic and common bile duct dilatation. 5. Stable bilateral renal cysts. 6. Aortic atherosclerosis. Aortic Atherosclerosis (ICD10-I70.0). Electronically Signed   By: Rudie Meyer M.D.   On: 03/14/2021 12:52   DG Chest Port 1 View  Result Date: 03/14/2021 CLINICAL DATA:  Sepsis. EXAM: PORTABLE CHEST 1 VIEW COMPARISON:  03/05/2021  FINDINGS: The cardiac silhouette, mediastinal and hilar contours are within normal limits and stable. Chronic bronchitic type lung changes are again noted. There appears to be persistent left basilar density which based on a prior CT scan appears to be a large hiatal hernia. No pleural effusions or pneumothorax. IMPRESSION: Chronic bronchitic type lung changes but no acute pulmonary findings. Large hiatal hernia. Electronically Signed   By: Rudie Meyer M.D.   On: 03/14/2021 10:51    Procedures Procedures   Medications Ordered in ED Medications  iohexol (OMNIPAQUE) 300 MG/ML solution 75 mL (75 mLs Intravenous Contrast Given 03/14/21 1223)  sodium chloride 0.9 % bolus 500 mL (500 mLs Intravenous New Bag/Given 03/14/21 1449)    ED Course  I have reviewed the triage vital signs and the nursing notes.  Pertinent labs & imaging results that were available during my care of the patient were reviewed by me and considered in my medical decision making (see chart for details).    MDM Rules/Calculators/A&P                          Patient was sent to the emergency department for evaluation of loose stools.  Patient had a watery stools with mucus in them earlier today.  She is currently on Augmentin, being treated for community-acquired pneumonia.  Chest x-ray today does not show any evidence of pneumonia.  GI symptoms likely secondary to the Augmentin.  Patient with a rectal temp of 95.4 at arrival.  This has corrected with blankets.  She did have some cough upon arrival but this seems to have resolved.  She is currently 99 to 100% on room air.  Patient with slight LFT elevations at arrival.  Etiology unclear.  This has developed since she was in the hospital.  She has had a previous cholecystectomy.  CT abdomen and pelvis does not show any acute abnormality.  C. difficile is negative.  Patient is alert and without complaints at this time.  All vital signs are normal at this time.  Urinalysis did not  suggest infection.  She does have very elevated specific gravity indicating dehydration.  Normal BUN and creatinine, however.  Patient administered IV fluids and will be returned to the nursing home.  Suggest following up of the LFTs as an outpatient.  Findings and plan discussed with Kathlen Brunswick, patient's nephew and next of kin.  Final Clinical Impression(s) / ED Diagnoses Final diagnoses:  Diarrhea, unspecified type    Rx / DC Orders ED Discharge Orders     None        Gilda Crease, MD 03/14/21 1540

## 2021-03-14 NOTE — ED Notes (Signed)
Patient transported to CT 

## 2021-03-14 NOTE — ED Notes (Signed)
[facility] called for transport.  

## 2021-03-14 NOTE — ED Notes (Signed)
Unable to get pt to sign MSE due to AMS, no family present.

## 2021-03-14 NOTE — ED Triage Notes (Signed)
Pt arrived via [facility] from [facility] for AMS, N/V/Dx2 days. Pt was recently D/C'd from here for pneumonia and was placed on atx. Per EMS staff told them pt BM this morning was runny and had mucous in it. EMS gave NS . PT is A-fib on monitor. Pt has moist cough.

## 2021-03-14 NOTE — Discharge Instructions (Addendum)
Stop Augmentin.  Chest x-ray is clear of pneumonia and it is likely causing GI symptoms.  LFTs were slightly elevated, recommend recheck in a week.  If patient runs a fever or becomes more ill, return to the ER.

## 2021-03-14 NOTE — ED Notes (Signed)
Placed pt on 2L 02 per [place]. 

## 2021-03-14 NOTE — ED Notes (Signed)
Dr Blinda Leatherwood aware of pt's temp and instructed to give pt several warm blankets and that's what we did.

## 2021-03-16 LAB — URINE CULTURE: Culture: NO GROWTH

## 2021-03-19 ENCOUNTER — Non-Acute Institutional Stay: Payer: Self-pay | Admitting: Nurse Practitioner

## 2021-03-19 ENCOUNTER — Other Ambulatory Visit: Payer: Self-pay

## 2021-03-19 DIAGNOSIS — F0391 Unspecified dementia with behavioral disturbance: Secondary | ICD-10-CM

## 2021-03-19 DIAGNOSIS — Z515 Encounter for palliative care: Secondary | ICD-10-CM

## 2021-03-19 DIAGNOSIS — R54 Age-related physical debility: Secondary | ICD-10-CM

## 2021-03-19 LAB — CULTURE, BLOOD (SINGLE): Culture: NO GROWTH

## 2021-03-19 NOTE — Progress Notes (Signed)
[facility] Pall[facility] Consult Note Telephone: [phone]  Fax: [phone]    Date of encounter: 03/19/21 PATIENT NAME: Gina Hodges [id number]   [phone] (home)  DOB: 1926-10-15 MRN: [id number]  PRIMARY CARE PROVIDER:    Clide Deutscher, NP  REFERRING PROVIDER:   Clide Deutscher, NP  RESPONSIBLE PARTY:    Contact Information     Name Relation Home Work [place] [phone]       I met face to face with patient in facility. Palliative Care was asked to follow this patient by consultation request of  Clide Deutscher to address advance care planning and complex medical decision making. This is the initial visit.                                    ASSESSMENT AND PLAN / RECOMMENDATIONS:   Advance Care Planning/Goals of Care:  CODE STATUS: DNR Directives: Signed DNR present on her chart in the facility. Unable to reach Nephew on phone to review patient's goal of care and for advance care planning discussion. Voice message left for return call.  Symptom Management/Plan: Frailty: Continue supportive care. Continue to assist with ADLs while encouraging patient to do as much as possible for self.  Maintain safety, prevent falls, maintain skin integrity Diarrhea: symptom improved, diarrhea likely related to antibiotics use. Continue Imodium as needed. Encourage adequate fluid intake. Dementia:  No report of uncontrolled behavior. Continue current plan of care.  Follow up Palliative Care Visit: Palliative care will continue to follow for complex medical decision making, advance care planning, and clarification of goals. Return as needed.  I spent 48 minutes providing this consultation. More than 50% of the time in this consultation was spent in counseling and care coordination.  PPS: 30%  HOSPICE ELIGIBILITY/DIAGNOSIS: TBD  Chief Complaint: Generalized weakness  History  obtained from review of EMR, discussion with facility staff, and interview with Ms. Virella.  HISTORY OF PRESENT ILLNESS:  Gina Hodges is a 87 y.o. year old female with medical problem significant for dementia, permanent Afib (not on anticoagulation), hypothyroidism. Patient with report of generalized weakness in the context of deconditioning related to worsening comorbid condition and recent hospitalization. Patient s/p hospitalization 03/05/2021 to 03/09/2021 for multifocal pneumonia. She also had an ED visit 5 days ago for diarrhea which has since improved on Imodium as needed. Facility report patient on schedule to start physical therapy. She is currently total assist with all her ADLs, able to now feed self in the last 2 days. She has non been ambulatory since hospital discharge. No report of fever, chills, increased SOB, or uncontrolled pain. Limited ROS from patient due to poor cognition.  Reviewed labs from 03/14/2021 Na 141, K 3.6, Cr 0.2, GFR >60, WBC 11.4, Hgb 12.7, Hct 42.4 CT abdomen and pelvis completed on 03/14/2021 does not show any acute abnormality  I reviewed available labs, medications, imaging, studies and related documents from the EMR.  Records reviewed and summarized above.   Physical Exam: Current and past weights: 152.5lbs up from 145.64lbs on 03/09/2021 Constitutional: NAD General: chronically ill and frail appearing, thin, lying in bed finishing lunch  EYES: anicteric sclera, no discharge  ENMT: hard of hearing, oral mucous membranes moist CV: S1S2 normal, no LE edema Pulmonary: LCTA, no increased work of breathing, no cough, saturation  95% on 2L supplemental oxygen. Abdomen:  intake poor, no ascites GU: deferred MSK: severe sarcopenia, moves all extremities, non-ambulatory Skin: warm and dry Neuro: generalized weakness, mild cognitive impairment Psych: non-anxious affect, A and O x 2 Hem/lymph/immuno: no widespread bruising  CURRENT PROBLEM LIST:  Patient Active  Problem List   Diagnosis Date Noted   Multifocal pneumonia 03/06/2021   Sepsis ([place]) 11/10/2019   AKI (acute kidney injury) ([place]) 11/10/2019   Fall 08/13/2019   Ingrown nail 11/12/2013   Compression fracture of spine ([place]) 11/07/2013   Hypothyroidism 11/07/2013   Hyperlipidemia    Dementia with behavioral problem (HCC)    GERD (gastroesophageal reflux disease)    PAF (paroxysmal atrial fibrillation) (HCC)    Generalized anxiety disorder 04/05/2013   PAST MEDICAL HISTORY:  Active Ambulatory Problems    Diagnosis Date Noted   Generalized anxiety disorder 04/05/2013   Hyperlipidemia    Dementia with behavioral problem (HCC)    GERD (gastroesophageal reflux disease)    PAF (paroxysmal atrial fibrillation) (HCC)    Compression fracture of spine ([place]) 11/07/2013   Hypothyroidism 11/07/2013   Ingrown nail 11/12/2013   Fall 08/13/2019   Sepsis ([place]) 11/10/2019   AKI (acute kidney injury) ([place]) 11/10/2019   Multifocal pneumonia 03/06/2021   Resolved Ambulatory Problems    Diagnosis Date Noted   Anxiety    UTI (urinary tract infection) 11/07/2013   Sternal fracture 11/07/2013   Healthcare-associated pneumonia 02/19/2016   SIRS (systemic inflammatory response syndrome) (HCC)    Lactic acidosis    Syncope and collapse 11/16/2018   Syncope 11/17/2018   Past Medical History:  Diagnosis Date   Anemia    Cerebral infarct (HCC)    Dementia (HCC)    Osteoporosis    Stroke ([place])    Thyroid disease    Ulcerative esophagitis    Vertigo    SOCIAL HX:  Social History   Tobacco Use   Smoking status: Never   Smokeless tobacco: Never  Substance Use Topics   Alcohol use: No   FAMILY HX:  Family History  Problem Relation Age of Onset   Depression Mother    Bipolar disorder Daughter    Drug abuse Son      ALLERGIES:  Allergies  Allergen Reactions   Penicillins Other (See Comments)    "Allergic," per [facility] Did it involve swelling of the face/tongue/throat, SOB, or low BP?  Unk Did it involve sudden or severe rash/hives, skin peeling, or any reaction on the inside of your mouth or nose? Unk Did you need to seek medical attention at a hospital or doctor's office? Unk When did it last happen? Unk If all above answers are "NO", may proceed with cephalosporin use.   Bactrim [Sulfamethoxazole-Trimethoprim] Other (See Comments)    "Allergic," per [facility]   Sulfa Antibiotics Other (See Comments)    "Allergic," per [facility]   Codeine Palpitations and Other (See Comments)    "Allergic," per [facility]     PERTINENT MEDICATIONS:  Outpatient Encounter Medications as of 03/19/2021  Medication Sig   acetaminophen (TYLENOL) 650 MG CR tablet Take 650 mg by mouth 3 (three) times daily.   ALPRAZolam (XANAX) 0.5 MG tablet Take 0.5 mg by mouth 2 (two) times daily.   alum & mag hydroxide-simeth (MAALOX/MYLANTA) 200-200-20 MG/5ML suspension Take 30 mLs by mouth every 8 (eight) hours as needed for indigestion or heartburn.    amoxicillin-clavulanate (AUGMENTIN) 875-125 MG tablet Take 1 tablet by mouth every 12 (twelve) hours.   atorvastatin (LIPITOR) 10 MG tablet Take 5 mg by  mouth at bedtime.   Calcium Carbonate-Vitamin D 600-400 MG-UNIT tablet Take 1 tablet by mouth 2 (two) times daily.    levothyroxine (SYNTHROID, LEVOTHROID) 75 MCG tablet Take 75 mcg by mouth daily before breakfast.    Lidocaine 4 % PTCH Apply 1 patch topically See admin instructions. Apply 1 patch to affected area(s) two times a day and remove, per schedule   loperamide (IMODIUM) 2 MG capsule Take 2-4 mg by mouth See admin instructions. Take 2 capsules (4 mg) by mouth at first loose stool, then take 1 capsule (2 mg) every 2 hours as needed for diarrhea/loose stools   loratadine (CLARITIN) 10 MG tablet Take 10 mg by mouth daily.   Melatonin 10 MG TABS Take 10 mg by mouth at bedtime.    meloxicam (MOBIC) 15 MG tablet Take 15 mg by mouth daily.   mirtazapine (REMERON) 15 MG tablet Take 15 mg by mouth at bedtime.   Multiple  Vitamin (MULTIVITAMIN WITH MINERALS) TABS tablet Take 1 tablet by mouth daily.    omeprazole (PRILOSEC) 20 MG capsule Take 20 mg by mouth 2 (two) times daily.   OXYGEN Inhale 2 L into the lungs continuous.   polyethylene glycol (MIRALAX / GLYCOLAX) packet Take 17 g by mouth daily as needed for mild constipation (MIX AND DRINK).    senna (SENOKOT) 8.6 MG TABS tablet Take 1 tablet by mouth daily.   traZODone (DESYREL) 150 MG tablet Take 75 mg by mouth at bedtime.   venlafaxine XR (EFFEXOR-XR) 75 MG 24 hr capsule Take 75 mg by mouth daily with breakfast.   No facility-administered encounter medications on file as of 03/19/2021.   Thank you for the opportunity to participate in the care of Ms. Raimer.  The palliative care team will continue to follow. Please call our office at [phone] if we can be of additional assistance.   Jari Favre, DNP, AGPCNP-BC  COVID-19 PATIENT SCREENING TOOL Asked and negative response unless otherwise noted:   Have you had symptoms of covid, tested positive or been in contact with someone with symptoms/positive test in the past 5-10 days?

## 2021-03-27 ENCOUNTER — Emergency Department (HOSPITAL_BASED_OUTPATIENT_CLINIC_OR_DEPARTMENT_OTHER): Payer: Medicare HMO

## 2021-03-27 ENCOUNTER — Encounter (HOSPITAL_BASED_OUTPATIENT_CLINIC_OR_DEPARTMENT_OTHER): Payer: Self-pay

## 2021-03-27 ENCOUNTER — Emergency Department (HOSPITAL_BASED_OUTPATIENT_CLINIC_OR_DEPARTMENT_OTHER)
Admission: EM | Admit: 2021-03-27 | Discharge: 2021-03-27 | Disposition: A | Discharge status: Home / Self Care | Payer: Medicare HMO | Source: Home / Self Care | Attending: Emergency Medicine | Admitting: Emergency Medicine

## 2021-03-27 ENCOUNTER — Other Ambulatory Visit: Payer: Self-pay

## 2021-03-27 DIAGNOSIS — F0391 Unspecified dementia with behavioral disturbance: Secondary | ICD-10-CM | POA: Insufficient documentation

## 2021-03-27 DIAGNOSIS — N3001 Acute cystitis with hematuria: Secondary | ICD-10-CM

## 2021-03-27 DIAGNOSIS — N390 Urinary tract infection, site not specified: Secondary | ICD-10-CM | POA: Diagnosis not present

## 2021-03-27 DIAGNOSIS — Z043 Encounter for examination and observation following other accident: Secondary | ICD-10-CM | POA: Insufficient documentation

## 2021-03-27 DIAGNOSIS — Z79899 Other long term (current) drug therapy: Secondary | ICD-10-CM | POA: Insufficient documentation

## 2021-03-27 DIAGNOSIS — E039 Hypothyroidism, unspecified: Secondary | ICD-10-CM | POA: Insufficient documentation

## 2021-03-27 DIAGNOSIS — W050XXA Fall from non-moving wheelchair, initial encounter: Secondary | ICD-10-CM

## 2021-03-27 DIAGNOSIS — N179 Acute kidney failure, unspecified: Secondary | ICD-10-CM | POA: Diagnosis not present

## 2021-03-27 LAB — URINALYSIS, ROUTINE W REFLEX MICROSCOPIC
Bilirubin Urine: NEGATIVE
Glucose, UA: NEGATIVE mg/dL
Ketones, ur: NEGATIVE mg/dL
Nitrite: NEGATIVE
Protein, ur: 30 mg/dL — AB
Specific Gravity, Urine: 1.02 (ref 1.005–1.030)
pH: 7 (ref 5.0–8.0)

## 2021-03-27 LAB — CBC WITH DIFFERENTIAL/PLATELET
Abs Immature Granulocytes: 0.02 10*3/uL (ref 0.00–0.07)
Basophils Absolute: 0.1 10*3/uL (ref 0.0–0.1)
Basophils Relative: 1 %
Eosinophils Absolute: 1.2 10*3/uL — ABNORMAL HIGH (ref 0.0–0.5)
Eosinophils Relative: 16 %
HCT: 36 % (ref 36.0–46.0)
Hemoglobin: 11.3 g/dL — ABNORMAL LOW (ref 12.0–15.0)
Immature Granulocytes: 0 %
Lymphocytes Relative: 19 %
Lymphs Abs: 1.5 10*3/uL (ref 0.7–4.0)
MCH: 29.4 pg (ref 26.0–34.0)
MCHC: 31.4 g/dL (ref 30.0–36.0)
MCV: 93.8 fL (ref 80.0–100.0)
Monocytes Absolute: 1.1 10*3/uL — ABNORMAL HIGH (ref 0.1–1.0)
Monocytes Relative: 14 %
Neutro Abs: 4 10*3/uL (ref 1.7–7.7)
Neutrophils Relative %: 50 %
Platelets: 373 10*3/uL (ref 150–400)
RBC: 3.84 MIL/uL — ABNORMAL LOW (ref 3.87–5.11)
RDW: 18.4 % — ABNORMAL HIGH (ref 11.5–15.5)
WBC: 7.9 10*3/uL (ref 4.0–10.5)
nRBC: 0 % (ref 0.0–0.2)

## 2021-03-27 LAB — BASIC METABOLIC PANEL
Anion gap: 8 (ref 5–15)
BUN: 15 mg/dL (ref 8–23)
CO2: 30 mmol/L (ref 22–32)
Calcium: 9.4 mg/dL (ref 8.9–10.3)
Chloride: 101 mmol/L (ref 98–111)
Creatinine, Ser: 0.78 mg/dL (ref 0.44–1.00)
GFR, Estimated: >60 mL/min (ref >60)
Glucose, Bld: 144 mg/dL — ABNORMAL HIGH (ref 70–99)
Potassium: 4.3 mmol/L (ref 3.5–5.1)
Sodium: 139 mmol/L (ref 135–145)

## 2021-03-27 MED ORDER — CEPHALEXIN 500 MG PO CAPS: 500.0000 mg | ORAL_CAPSULE | Freq: Three times a day (TID) | ORAL | 0 refills | Status: DC

## 2021-03-27 NOTE — ED Notes (Signed)
Report called to Patience, med tech at [facility].

## 2021-03-27 NOTE — ED Notes (Signed)
Pt incontinent of urine and stool.  Purewick placed.

## 2021-03-27 NOTE — ED Triage Notes (Addendum)
Pt brought in by [facility] following a witness fall out of a wheelchair at nursing home.   No LOC.  Pt states she is not having any pain anywhere.

## 2021-03-27 NOTE — ED Notes (Signed)
Pt brief soiled with stool, Purewick unable to function properly due to amount of stool noted to brief.  Pt cleansed with pericare cleaner, clean brief and new purewick applied.

## 2021-03-27 NOTE — ED Provider Notes (Signed)
[facility]   CSN: [id number] Arrival date & time: 03/27/21  1331     History Chief Complaint  Patient presents with   Gina Hodges    Gina NoraLora Wagar is a 87 y.o. female.  Gina Hodges was wheeling down a corner in her wheelchair when she fell out of her wheelchair.  She does not complain of any pain, but she has severe dementia.  The history is provided by the EMS personnel and the nursing home. The history is limited by the condition of the patient (dementia).  Fall This is a new problem. The current episode started less than 1 hour ago. The problem occurs constantly. The problem has been resolved. Pertinent negatives include no chest pain, no abdominal pain, no headaches and no shortness of breath. Nothing aggravates the symptoms. Nothing relieves the symptoms. She has tried nothing for the symptoms. The treatment provided no relief.      Past Medical History:  Diagnosis Date   Anemia    Anxiety    Cerebral infarct (HCC)    Dementia (HCC)    Dementia with behavioral problem (HCC)    GERD (gastroesophageal reflux disease)    Hyperlipidemia    Osteoporosis    PAF (paroxysmal atrial fibrillation) (HCC)    Stroke (HCC)    Thyroid disease    Ulcerative esophagitis    UTI (urinary tract infection)    Vertigo     Patient Active Problem List   Diagnosis Date Noted   Multifocal pneumonia 03/06/2021   Sepsis (HCC) 11/10/2019   AKI (acute kidney injury) (HCC) 11/10/2019   Fall 08/13/2019   Ingrown nail 11/12/2013   Compression fracture of spine (HCC) 11/07/2013   Hypothyroidism 11/07/2013   Hyperlipidemia    Dementia with behavioral problem (HCC)    GERD (gastroesophageal reflux disease)    PAF (paroxysmal atrial fibrillation) (HCC)    Generalized anxiety disorder 04/05/2013    Past Surgical History:  Procedure Laterality Date   ABDOMINAL HYSTERECTOMY     APPENDECTOMY     CHOLECYSTECTOMY       OB History   No obstetric history  on file.     Family History  Problem Relation Age of Onset   Depression Mother    Bipolar disorder Daughter    Drug abuse Son     Social History   Tobacco Use   Smoking status: Never   Smokeless tobacco: Never  Substance Use Topics   Alcohol use: No   Drug use: No    Home Medications Prior to Admission medications   Medication Sig Start Date End Date Taking? Authorizing Provider  acetaminophen (TYLENOL) 650 MG CR tablet Take 650 mg by mouth 3 (three) times daily.    [provider]  ALPRAZolam Prudy Feeler(XANAX) 0.5 MG tablet Take 0.5 mg by mouth 2 (two) times daily.    [provider]  alum & mag hydroxide-simeth (MAALOX/MYLANTA) 200-200-20 MG/5ML suspension Take 30 mLs by mouth every 8 (eight) hours as needed for indigestion or heartburn.     [provider]  amoxicillin-clavulanate (AUGMENTIN) 875-125 MG tablet Take 1 tablet by mouth every 12 (twelve) hours. 03/09/21   [name], [name], MD  atorvastatin (LIPITOR) 10 MG tablet Take 5 mg by mouth at bedtime.    [provider]  Calcium Carbonate-Vitamin D 600-400 MG-UNIT tablet Take 1 tablet by mouth 2 (two) times daily.     [provider]  levothyroxine (SYNTHROID, LEVOTHROID) 75 MCG tablet Take 75 mcg by mouth  daily before breakfast.  02/27/13   [provider]  Lidocaine 4 % PTCH Apply 1 patch topically See admin instructions. Apply 1 patch to affected area(s) two times a day and remove, per schedule    [provider]  loperamide (IMODIUM) 2 MG capsule Take 2-4 mg by mouth See admin instructions. Take 2 capsules (4 mg) by mouth at first loose stool, then take 1 capsule (2 mg) every 2 hours as needed for diarrhea/loose stools 11/08/18   [provider]  loratadine (CLARITIN) 10 MG tablet Take 10 mg by mouth daily.    [provider]  Melatonin 10 MG TABS Take 10 mg by mouth at bedtime.     [provider]  meloxicam (MOBIC) 15 MG tablet Take 15 mg by  mouth daily. 10/30/18   [provider]  mirtazapine (REMERON) 15 MG tablet Take 15 mg by mouth at bedtime. 03/03/21   [provider]  Multiple Vitamin (MULTIVITAMIN WITH MINERALS) TABS tablet Take 1 tablet by mouth daily.     [provider]  omeprazole (PRILOSEC) 20 MG capsule Take 20 mg by mouth 2 (two) times daily. 10/17/18   [provider]  OXYGEN Inhale 2 L into the lungs continuous.    [provider]  polyethylene glycol (MIRALAX / GLYCOLAX) packet Take 17 g by mouth daily as needed for mild constipation (MIX AND DRINK).     [provider]  senna (SENOKOT) 8.6 MG TABS tablet Take 1 tablet by mouth daily.    [provider]  traZODone (DESYREL) 150 MG tablet Take 75 mg by mouth at bedtime. 10/27/18   [provider]  venlafaxine XR (EFFEXOR-XR) 75 MG 24 hr capsule Take 75 mg by mouth daily with breakfast.    [provider]    Allergies    Penicillins, Bactrim [sulfamethoxazole-trimethoprim], Sulfa antibiotics, and Codeine  Review of Systems   Review of Systems  Constitutional:  Negative for chills and fever.  HENT:  Negative for ear pain and sore throat.   Eyes:  Negative for pain and visual disturbance.  Respiratory:  Negative for cough and shortness of breath.   Cardiovascular:  Negative for chest pain and palpitations.  Gastrointestinal:  Negative for abdominal pain and vomiting.  Genitourinary:  Negative for dysuria and hematuria.  Musculoskeletal:  Negative for arthralgias and back pain.  Skin:  Negative for color change and rash.  Neurological:  Negative for seizures, syncope and headaches.  All other systems reviewed and are negative.  Physical Exam Updated Vital Signs BP (!) 115/99 (BP Location: Right Arm)   Pulse 92   Temp (!) 97 F (36.1 C) (Axillary)   Resp 16   SpO2 100%   Physical Exam Vitals and nursing note reviewed.  Constitutional:      Appearance: She is well-developed.   HENT:     Head: Normocephalic and atraumatic.  Cardiovascular:     Rate and Rhythm: Normal rate and regular rhythm.     Heart sounds: Normal heart sounds.  Pulmonary:     Effort: Pulmonary effort is normal. No tachypnea.     Breath sounds: Normal breath sounds.  Abdominal:     Palpations: Abdomen is soft.     Tenderness: There is no abdominal tenderness.  Genitourinary:    Rectum: Normal.     Comments: No sacral breakdown Musculoskeletal:        General: No deformity or signs of injury.     Right lower leg: No edema.  Left lower leg: No edema.  Skin:    General: Skin is warm and dry.  Neurological:     General: No focal deficit present.     Mental Status: She is alert.     Comments: Hard of hearing.  Answers basic questions.  Follows basic commands  Psychiatric:        Mood and Affect: Mood normal.        Behavior: Behavior normal.    ED Results / Procedures / Treatments   Labs (all labs ordered are listed, but only abnormal results are displayed) Labs Reviewed  CBC WITH DIFFERENTIAL/PLATELET - Abnormal; Notable for the following components:      Result Value   RBC 3.84 (*)    Hemoglobin 11.3 (*)    RDW 18.4 (*)    Monocytes Absolute 1.1 (*)    Eosinophils Absolute 1.2 (*)    All other components within normal limits  BASIC METABOLIC PANEL - Abnormal; Notable for the following components:   Glucose, Bld 144 (*)    All other components within normal limits  URINALYSIS, ROUTINE W REFLEX MICROSCOPIC - Abnormal; Notable for the following components:   APPearance HAZY (*)    Hgb urine dipstick SMALL (*)    Protein, ur 30 (*)    Leukocytes,Ua LARGE (*)    Bacteria, UA MANY (*)    All other components within normal limits  URINE CULTURE    EKG None  Radiology CT Head Wo Contrast  Result Date: 03/27/2021 CLINICAL DATA:  Fall from wheelchair. EXAM: CT HEAD WITHOUT CONTRAST TECHNIQUE: Contiguous axial images were obtained from the base of the skull through the  vertex without intravenous contrast. COMPARISON:  03/14/2021 FINDINGS: The study is mildly motion degraded despite repeat imaging. Brain: There is no evidence of an acute infarct, intracranial hemorrhage, mass, midline shift, or extra-axial fluid collection. Advanced cerebral atrophy is again noted. Confluent hypodensities in the cerebral white matter bilaterally are similar to the prior study and are nonspecific but compatible with severe chronic small vessel ischemic disease. There are chronic lacunar infarcts in the thalami and inferior right cerebellum. Vascular: Calcified atherosclerosis at the skull base. No hyperdense vessel. Skull: No fracture or suspicious osseous lesion. Sinuses/Orbits: Large volume fluid in the left sphenoid sinus, slightly increased from the prior CT. Clear mastoid air cells. Bilateral cataract extraction. Other: None. IMPRESSION: 1. No evidence of acute intracranial abnormality. 2. Severe chronic small vessel ischemic disease and advanced cerebral atrophy. 3. Increased fluid in the left sphenoid sinus. Correlate for acute sinusitis. Electronically Signed   By: Sebastian Ache M.D.   On: 03/27/2021 15:04   DG Knee Complete 4 Views Left  Result Date: 03/27/2021 CLINICAL DATA:  Fall with LEFT knee pain. Fall from wheelchair in a 88 year old female. EXAM: LEFT KNEE - COMPLETE 4+ VIEW COMPARISON:  None FINDINGS: Four views LEFT knee without sign of fracture, dislocation or joint effusion. Osteopenia. Mild degenerative changes. IMPRESSION: No fracture or dislocation. Osteopenia and mild degenerative changes. Electronically Signed   By: Donzetta Kohut M.D.   On: 03/27/2021 15:05    Procedures Procedures   Medications Ordered in ED Medications - No data to display  ED Course  I have reviewed the triage vital signs and the nursing notes.  Pertinent labs & imaging results that were available during my care of the patient were reviewed by me and considered in my medical decision making  (see chart for details).    MDM Rules/Calculators/A&P  Gina Nora presented via EMS after she fell from her wheelchair.  No obvious trauma, but history is limited secondary to her inability to fully describe her symptoms because of her dementia.  She will be evaluated with a head CT, x-ray of her left knee, and basic labs with a urinalysis.  It does appear that she has a urinary tract infection.  She will be treated for this.  Otherwise, labs are reassuring, and she has no evidence of trauma from her fall. Final Clinical Impression(s) / ED Diagnoses Final diagnoses:  Fall from wheelchair, initial encounter    Rx / DC Orders ED Discharge Orders     None        Koleen Distance, MD 03/27/21 [phone]

## 2021-03-28 ENCOUNTER — Emergency Department (HOSPITAL_COMMUNITY): Payer: Medicare HMO

## 2021-03-28 ENCOUNTER — Inpatient Hospital Stay (HOSPITAL_COMMUNITY)
Admission: EM | Admit: 2021-03-28 | Discharge: 2021-04-08 | DRG: 689 | Disposition: A | Discharge status: Skilled Nursing Facility | Payer: Medicare HMO | Source: Skilled Nursing Facility | Attending: Internal Medicine | Admitting: Internal Medicine

## 2021-03-28 ENCOUNTER — Encounter (HOSPITAL_COMMUNITY): Payer: Self-pay | Admitting: Emergency Medicine

## 2021-03-28 ENCOUNTER — Other Ambulatory Visit: Payer: Self-pay

## 2021-03-28 ENCOUNTER — Inpatient Hospital Stay (HOSPITAL_COMMUNITY): Payer: Medicare HMO

## 2021-03-28 DIAGNOSIS — E872 Acidosis: Secondary | ICD-10-CM | POA: Diagnosis present

## 2021-03-28 DIAGNOSIS — Z7189 Other specified counseling: Secondary | ICD-10-CM | POA: Diagnosis not present

## 2021-03-28 DIAGNOSIS — Z888 Allergy status to other drugs, medicaments and biological substances status: Secondary | ICD-10-CM

## 2021-03-28 DIAGNOSIS — N390 Urinary tract infection, site not specified: Secondary | ICD-10-CM | POA: Diagnosis present

## 2021-03-28 DIAGNOSIS — Z885 Allergy status to narcotic agent status: Secondary | ICD-10-CM

## 2021-03-28 DIAGNOSIS — E785 Hyperlipidemia, unspecified: Secondary | ICD-10-CM | POA: Diagnosis present

## 2021-03-28 DIAGNOSIS — I480 Paroxysmal atrial fibrillation: Secondary | ICD-10-CM | POA: Diagnosis present

## 2021-03-28 DIAGNOSIS — W050XXA Fall from non-moving wheelchair, initial encounter: Secondary | ICD-10-CM | POA: Diagnosis present

## 2021-03-28 DIAGNOSIS — M810 Age-related osteoporosis without current pathological fracture: Secondary | ICD-10-CM | POA: Diagnosis present

## 2021-03-28 DIAGNOSIS — F411 Generalized anxiety disorder: Secondary | ICD-10-CM | POA: Diagnosis present

## 2021-03-28 DIAGNOSIS — R739 Hyperglycemia, unspecified: Secondary | ICD-10-CM | POA: Diagnosis present

## 2021-03-28 DIAGNOSIS — B9620 Unspecified Escherichia coli [E. coli] as the cause of diseases classified elsewhere: Secondary | ICD-10-CM | POA: Diagnosis present

## 2021-03-28 DIAGNOSIS — H9190 Unspecified hearing loss, unspecified ear: Secondary | ICD-10-CM | POA: Diagnosis present

## 2021-03-28 DIAGNOSIS — Z881 Allergy status to other antibiotic agents status: Secondary | ICD-10-CM

## 2021-03-28 DIAGNOSIS — Z79899 Other long term (current) drug therapy: Secondary | ICD-10-CM

## 2021-03-28 DIAGNOSIS — Z20822 Contact with and (suspected) exposure to covid-19: Secondary | ICD-10-CM | POA: Diagnosis present

## 2021-03-28 DIAGNOSIS — Z6826 Body mass index (BMI) 26.0-26.9, adult: Secondary | ICD-10-CM

## 2021-03-28 DIAGNOSIS — Z9981 Dependence on supplemental oxygen: Secondary | ICD-10-CM

## 2021-03-28 DIAGNOSIS — N179 Acute kidney failure, unspecified: Secondary | ICD-10-CM | POA: Diagnosis present

## 2021-03-28 DIAGNOSIS — F419 Anxiety disorder, unspecified: Secondary | ICD-10-CM | POA: Diagnosis present

## 2021-03-28 DIAGNOSIS — E876 Hypokalemia: Secondary | ICD-10-CM | POA: Diagnosis present

## 2021-03-28 DIAGNOSIS — E039 Hypothyroidism, unspecified: Secondary | ICD-10-CM | POA: Diagnosis present

## 2021-03-28 DIAGNOSIS — D696 Thrombocytopenia, unspecified: Secondary | ICD-10-CM | POA: Diagnosis present

## 2021-03-28 DIAGNOSIS — R4189 Other symptoms and signs involving cognitive functions and awareness: Secondary | ICD-10-CM | POA: Diagnosis present

## 2021-03-28 DIAGNOSIS — E871 Hypo-osmolality and hyponatremia: Secondary | ICD-10-CM | POA: Diagnosis present

## 2021-03-28 DIAGNOSIS — G9341 Metabolic encephalopathy: Secondary | ICD-10-CM | POA: Diagnosis present

## 2021-03-28 DIAGNOSIS — Z66 Do not resuscitate: Secondary | ICD-10-CM | POA: Diagnosis present

## 2021-03-28 DIAGNOSIS — Z9049 Acquired absence of other specified parts of digestive tract: Secondary | ICD-10-CM

## 2021-03-28 DIAGNOSIS — Z90710 Acquired absence of both cervix and uterus: Secondary | ICD-10-CM

## 2021-03-28 DIAGNOSIS — K219 Gastro-esophageal reflux disease without esophagitis: Secondary | ICD-10-CM | POA: Diagnosis present

## 2021-03-28 DIAGNOSIS — Z8673 Personal history of transient ischemic attack (TIA), and cerebral infarction without residual deficits: Secondary | ICD-10-CM

## 2021-03-28 DIAGNOSIS — F0391 Unspecified dementia with behavioral disturbance: Secondary | ICD-10-CM | POA: Diagnosis present

## 2021-03-28 DIAGNOSIS — Z880 Allergy status to penicillin: Secondary | ICD-10-CM

## 2021-03-28 DIAGNOSIS — E440 Moderate protein-calorie malnutrition: Secondary | ICD-10-CM | POA: Diagnosis present

## 2021-03-28 DIAGNOSIS — Z79890 Hormone replacement therapy (postmenopausal): Secondary | ICD-10-CM

## 2021-03-28 DIAGNOSIS — L89621 Pressure ulcer of left heel, stage 1: Secondary | ICD-10-CM | POA: Diagnosis present

## 2021-03-28 DIAGNOSIS — Z515 Encounter for palliative care: Secondary | ICD-10-CM | POA: Diagnosis not present

## 2021-03-28 DIAGNOSIS — F03918 Unspecified dementia, unspecified severity, with other behavioral disturbance: Secondary | ICD-10-CM | POA: Diagnosis present

## 2021-03-28 DIAGNOSIS — R7303 Prediabetes: Secondary | ICD-10-CM | POA: Diagnosis not present

## 2021-03-28 DIAGNOSIS — L8990 Pressure ulcer of unspecified site, unspecified stage: Secondary | ICD-10-CM | POA: Insufficient documentation

## 2021-03-28 DIAGNOSIS — Z79891 Long term (current) use of opiate analgesic: Secondary | ICD-10-CM

## 2021-03-28 DIAGNOSIS — Z818 Family history of other mental and behavioral disorders: Secondary | ICD-10-CM

## 2021-03-28 DIAGNOSIS — Z882 Allergy status to sulfonamides status: Secondary | ICD-10-CM

## 2021-03-28 LAB — URINALYSIS, ROUTINE W REFLEX MICROSCOPIC
Bilirubin Urine: NEGATIVE
Glucose, UA: NEGATIVE mg/dL
Ketones, ur: NEGATIVE mg/dL
Nitrite: POSITIVE — AB
Protein, ur: 30 mg/dL — AB
Specific Gravity, Urine: 1.015 (ref 1.005–1.030)
WBC, UA: >50 WBC/hpf — ABNORMAL HIGH (ref 0–5)
pH: 7 (ref 5.0–8.0)

## 2021-03-28 LAB — CBC WITH DIFFERENTIAL/PLATELET
Abs Immature Granulocytes: 0.02 10*3/uL (ref 0.00–0.07)
Basophils Absolute: 0 10*3/uL (ref 0.0–0.1)
Basophils Relative: 0 %
Eosinophils Absolute: 0 10*3/uL (ref 0.0–0.5)
Eosinophils Relative: 0 %
HCT: 38 % (ref 36.0–46.0)
Hemoglobin: 13.2 g/dL (ref 12.0–15.0)
Immature Granulocytes: 0 %
Lymphocytes Relative: 20 %
Lymphs Abs: 1.2 10*3/uL (ref 0.7–4.0)
MCH: 31.7 pg (ref 26.0–34.0)
MCHC: 34.7 g/dL (ref 30.0–36.0)
MCV: 91.1 fL (ref 80.0–100.0)
Monocytes Absolute: 1.1 10*3/uL — ABNORMAL HIGH (ref 0.1–1.0)
Monocytes Relative: 19 %
Neutro Abs: 3.6 10*3/uL (ref 1.7–7.7)
Neutrophils Relative %: 61 %
Platelets: 143 10*3/uL — ABNORMAL LOW (ref 150–400)
RBC: 4.17 MIL/uL (ref 3.87–5.11)
RDW: 14.2 % (ref 11.5–15.5)
WBC: 5.9 10*3/uL (ref 4.0–10.5)
nRBC: 0 % (ref 0.0–0.2)

## 2021-03-28 LAB — RESP PANEL BY RT-PCR (FLU A&B, COVID) ARPGX2
Influenza A by PCR: NEGATIVE
Influenza B by PCR: NEGATIVE
SARS Coronavirus 2 by RT PCR: NEGATIVE

## 2021-03-28 LAB — COMPREHENSIVE METABOLIC PANEL
ALT: 38 U/L (ref 0–44)
AST: 70 U/L — ABNORMAL HIGH (ref 15–41)
Albumin: 4.7 g/dL (ref 3.5–5.0)
Alkaline Phosphatase: 91 U/L (ref 38–126)
Anion gap: >20 — ABNORMAL HIGH (ref 5–15)
BUN: 22 mg/dL (ref 8–23)
CO2: 18 mmol/L — ABNORMAL LOW (ref 22–32)
Calcium: 9.5 mg/dL (ref 8.9–10.3)
Chloride: 89 mmol/L — ABNORMAL LOW (ref 98–111)
Creatinine, Ser: 2.67 mg/dL — ABNORMAL HIGH (ref 0.44–1.00)
GFR, Estimated: 16 mL/min — ABNORMAL LOW (ref >60)
Glucose, Bld: 199 mg/dL — ABNORMAL HIGH (ref 70–99)
Potassium: 3.3 mmol/L — ABNORMAL LOW (ref 3.5–5.1)
Sodium: 128 mmol/L — ABNORMAL LOW (ref 135–145)
Total Bilirubin: 1.3 mg/dL — ABNORMAL HIGH (ref 0.3–1.2)
Total Protein: 9 g/dL — ABNORMAL HIGH (ref 6.5–8.1)

## 2021-03-28 LAB — LACTIC ACID, PLASMA: Lactic Acid, Venous: 1.8 mmol/L (ref 0.5–1.9)

## 2021-03-28 LAB — BRAIN NATRIURETIC PEPTIDE: B Natriuretic Peptide: 27.5 pg/mL (ref 0.0–100.0)

## 2021-03-28 LAB — MAGNESIUM: Magnesium: 1.8 mg/dL (ref 1.7–2.4)

## 2021-03-28 MED ORDER — SODIUM CHLORIDE 0.9 % IV SOLN: INTRAVENOUS | Status: DC

## 2021-03-28 MED ORDER — ACETAMINOPHEN 650 MG RE SUPP: 650.0000 mg | Freq: Four times a day (QID) | RECTAL | Status: DC | PRN

## 2021-03-28 MED ORDER — LEVOTHYROXINE SODIUM 50 MCG PO TABS
75.0000 ug | ORAL_TABLET | Freq: Every day | ORAL | Status: DC
  Administered 2021-03-29 – 2021-04-08 (×11): 75 ug via ORAL
  Filled 2021-03-28 (×11): qty 1

## 2021-03-28 MED ORDER — ONDANSETRON HCL 4 MG PO TABS: 4.0000 mg | ORAL_TABLET | Freq: Four times a day (QID) | ORAL | Status: DC | PRN

## 2021-03-28 MED ORDER — LACTATED RINGERS IV SOLN: INTRAVENOUS | Status: DC

## 2021-03-28 MED ORDER — VENLAFAXINE HCL ER 75 MG PO CP24
75.0000 mg | ORAL_CAPSULE | Freq: Every day | ORAL | Status: DC
  Administered 2021-03-29 – 2021-04-08 (×11): 75 mg via ORAL
  Filled 2021-03-28 (×11): qty 1

## 2021-03-28 MED ORDER — ZINC OXIDE 40 % EX OINT
TOPICAL_OINTMENT | Freq: Three times a day (TID) | CUTANEOUS | Status: DC | PRN
  Filled 2021-03-28: qty 57

## 2021-03-28 MED ORDER — CALCIUM CARBONATE-VITAMIN D 500-200 MG-UNIT PO TABS
1.0000 | ORAL_TABLET | Freq: Two times a day (BID) | ORAL | Status: DC
  Administered 2021-03-28 – 2021-04-08 (×22): 1 via ORAL
  Filled 2021-03-28 (×22): qty 1

## 2021-03-28 MED ORDER — LORATADINE 10 MG PO TABS
10.0000 mg | ORAL_TABLET | Freq: Every day | ORAL | Status: DC
  Administered 2021-03-29 – 2021-04-08 (×11): 10 mg via ORAL
  Filled 2021-03-28 (×11): qty 1

## 2021-03-28 MED ORDER — MIRTAZAPINE 15 MG PO TABS
15.0000 mg | ORAL_TABLET | Freq: Every day | ORAL | Status: DC
  Administered 2021-03-28 – 2021-04-07 (×11): 15 mg via ORAL
  Filled 2021-03-28 (×11): qty 1

## 2021-03-28 MED ORDER — HYDROCORTISONE 1 % EX CREA
TOPICAL_CREAM | Freq: Two times a day (BID) | CUTANEOUS | Status: DC
  Administered 2021-04-04 – 2021-04-05 (×2): 1 via TOPICAL
  Filled 2021-03-28: qty 28

## 2021-03-28 MED ORDER — SODIUM CHLORIDE 0.9 % IV SOLN
INTRAVENOUS | Status: DC
  Administered 2021-03-28: 10 mL/h via INTRAVENOUS

## 2021-03-28 MED ORDER — ALPRAZOLAM 0.5 MG PO TABS
0.5000 mg | ORAL_TABLET | Freq: Two times a day (BID) | ORAL | Status: DC
  Administered 2021-03-28 – 2021-04-08 (×22): 0.5 mg via ORAL
  Filled 2021-03-28 (×22): qty 1

## 2021-03-28 MED ORDER — ACETAMINOPHEN 325 MG PO TABS
650.0000 mg | ORAL_TABLET | Freq: Four times a day (QID) | ORAL | Status: DC | PRN
  Administered 2021-04-06: 650 mg via ORAL
  Filled 2021-03-28: qty 2

## 2021-03-28 MED ORDER — SODIUM CHLORIDE 0.9 % IV SOLN
2.0000 g | INTRAVENOUS | Status: DC
  Administered 2021-03-28 – 2021-03-30 (×3): 2 g via INTRAVENOUS
  Filled 2021-03-28: qty 2
  Filled 2021-03-28 (×3): qty 20

## 2021-03-28 MED ORDER — MELATONIN 5 MG PO TABS
10.0000 mg | ORAL_TABLET | Freq: Every day | ORAL | Status: DC
  Administered 2021-03-28 – 2021-04-07 (×11): 10 mg via ORAL
  Filled 2021-03-28 (×11): qty 2

## 2021-03-28 MED ORDER — SENNA 8.6 MG PO TABS
1.0000 | ORAL_TABLET | Freq: Every day | ORAL | Status: DC
  Administered 2021-03-29 – 2021-04-08 (×7): 8.6 mg via ORAL
  Filled 2021-03-28 (×9): qty 1

## 2021-03-28 MED ORDER — TRAZODONE HCL 50 MG PO TABS
75.0000 mg | ORAL_TABLET | Freq: Every day | ORAL | Status: DC
  Administered 2021-03-28 – 2021-04-07 (×11): 75 mg via ORAL
  Filled 2021-03-28 (×11): qty 2

## 2021-03-28 MED ORDER — ONDANSETRON HCL 4 MG/2ML IJ SOLN
4.0000 mg | Freq: Four times a day (QID) | INTRAMUSCULAR | Status: DC | PRN
  Administered 2021-04-02: 4 mg via INTRAVENOUS
  Filled 2021-03-28: qty 2

## 2021-03-28 MED ORDER — POTASSIUM CHLORIDE CRYS ER 20 MEQ PO TBCR
40.0000 meq | EXTENDED_RELEASE_TABLET | Freq: Every day | ORAL | Status: DC
  Administered 2021-03-28: 40 meq via ORAL
  Filled 2021-03-28: qty 2

## 2021-03-28 MED ORDER — ADULT MULTIVITAMIN W/MINERALS CH
1.0000 | ORAL_TABLET | Freq: Every day | ORAL | Status: DC
  Administered 2021-03-29 – 2021-04-08 (×11): 1 via ORAL
  Filled 2021-03-28 (×11): qty 1

## 2021-03-28 NOTE — H&P (Addendum)
History and Physical    Gina Hodges JIR:[id number] DOB: 1927-01-05 DOA: 03/28/2021  PCP: Fransico Michael [facility]  Patient coming from: [facility]  Chief Complaint: AMS  HPI: Gina Hodges is a 87 y.o. female with medical history significant of dementia, HLD, GERD, hypothyroidism. Presenting with altered mental status. Patient is a poor historian. History is from chart review as there is no one available to speak for her. The patient was recently seen in the ED for a fall from a wheelchair. CTH was negative. W/u revealed a UTI. She was started on abx and sent back to her facility. The staff at the facility notes this morning that she seems more altered. So, she was sent back to the ED for evaluation.   ED Course: W/u revealed AKI, metabolic acidosis, hypokalemia, and hyponatremia. She was started on rocephin. TRH was called for admission.   Review of Systems:  Unable to obtain d/t mentation.  PMHx Past Medical History:  Diagnosis Date   Anemia    Anxiety    Cerebral infarct (HCC)    Dementia (HCC)    Dementia with behavioral problem (HCC)    GERD (gastroesophageal reflux disease)    Hyperlipidemia    Osteoporosis    PAF (paroxysmal atrial fibrillation) (HCC)    Stroke (HCC)    Thyroid disease    Ulcerative esophagitis    UTI (urinary tract infection)    Vertigo     PSHx Past Surgical History:  Procedure Laterality Date   ABDOMINAL HYSTERECTOMY     APPENDECTOMY     CHOLECYSTECTOMY      SocHx  reports that she has never smoked. She has never used smokeless tobacco. She reports that she does not drink alcohol and does not use drugs.  Allergies  Allergen Reactions   Penicillins Other (See Comments)    "Allergic," per [facility] Did it involve swelling of the face/tongue/throat, SOB, or low BP? Unk Did it involve sudden or severe rash/hives, skin peeling, or any reaction on the inside of your mouth or nose? Unk Did you need to seek medical attention at a hospital  or doctor's office? Unk When did it last happen? Unk If all above answers are "NO", may proceed with cephalosporin use.   Bactrim [Sulfamethoxazole-Trimethoprim] Other (See Comments)    "Allergic," per [facility]   Sulfa Antibiotics Other (See Comments)    "Allergic," per [facility]   Codeine Palpitations and Other (See Comments)    "Allergic," per [facility]    FamHx Family History  Problem Relation Age of Onset   Depression Mother    Bipolar disorder Daughter    Drug abuse Son     Prior to Admission medications   Medication Sig Start Date End Date Taking? Authorizing Provider  acetaminophen (TYLENOL) 650 MG CR tablet Take 650 mg by mouth 3 (three) times daily.    [provider]  ALPRAZolam Prudy Feeler) 0.5 MG tablet Take 0.5 mg by mouth 2 (two) times daily.    [provider]  alum & mag hydroxide-simeth (MAALOX/MYLANTA) 200-200-20 MG/5ML suspension Take 30 mLs by mouth every 8 (eight) hours as needed for indigestion or heartburn.     [provider]  amoxicillin-clavulanate (AUGMENTIN) 875-125 MG tablet Take 1 tablet by mouth every 12 (twelve) hours. 03/09/21   Marinda Elk, MD  atorvastatin (LIPITOR) 10 MG tablet Take 5 mg by mouth at bedtime.    [provider]  Calcium Carbonate-Vitamin D 600-400 MG-UNIT tablet Take 1 tablet by mouth 2 (two) times daily.  [provider]  cephALEXin (KEFLEX) 500 MG capsule Take 1 capsule (500 mg total) by mouth 3 (three) times daily for 7 days. 03/27/21 04/03/21  Vanetta Mulders, MD  levothyroxine (SYNTHROID, LEVOTHROID) 75 MCG tablet Take 75 mcg by mouth daily before breakfast.  02/27/13   [provider]  Lidocaine 4 % PTCH Apply 1 patch topically See admin instructions. Apply 1 patch to affected area(s) two times a day and remove, per schedule    [provider]  loperamide (IMODIUM) 2 MG capsule Take 2-4 mg by mouth See admin instructions. Take 2 capsules (4 mg) by mouth at first loose stool, then  take 1 capsule (2 mg) every 2 hours as needed for diarrhea/loose stools 11/08/18   [provider]  loratadine (CLARITIN) 10 MG tablet Take 10 mg by mouth daily.    [provider]  Melatonin 10 MG TABS Take 10 mg by mouth at bedtime.     [provider]  meloxicam (MOBIC) 15 MG tablet Take 15 mg by mouth daily. 10/30/18   [provider]  mirtazapine (REMERON) 15 MG tablet Take 15 mg by mouth at bedtime. 03/03/21   [provider]  Multiple Vitamin (MULTIVITAMIN WITH MINERALS) TABS tablet Take 1 tablet by mouth daily.     [provider]  omeprazole (PRILOSEC) 20 MG capsule Take 20 mg by mouth 2 (two) times daily. 10/17/18   [provider]  OXYGEN Inhale 2 L into the lungs continuous.    [provider]  polyethylene glycol (MIRALAX / GLYCOLAX) packet Take 17 g by mouth daily as needed for mild constipation (MIX AND DRINK).     [provider]  senna (SENOKOT) 8.6 MG TABS tablet Take 1 tablet by mouth daily.    [provider]  traZODone (DESYREL) 150 MG tablet Take 75 mg by mouth at bedtime. 10/27/18   [provider]  venlafaxine XR (EFFEXOR-XR) 75 MG 24 hr capsule Take 75 mg by mouth daily with breakfast.    [provider]    Physical Exam: Vitals:   03/28/21 1230 03/28/21 1245 03/28/21 1300 03/28/21 1409  BP: (!) 148/87 (!) 154/93 (!) 142/85   Pulse: 85 (!) 50 87 99  Resp: 15 17 17 16   Temp:      TempSrc:      SpO2: 98% 98% 99% 99%  Weight:      Height:        General: 87 y.o. female resting in bed in NAD Eyes: PERRL, normal sclera ENMT: Nares patent w/o discharge, orophaynx clear, dentition normal, ears w/o discharge/lesions/ulcers Neck: Supple, trachea midline Cardiovascular: tachy, +S1, S2, no m/g/r, equal pulses throughout Respiratory: CTABL, no w/r/r, normal WOB GI: BS+, NDNT, no masses noted, no organomegaly noted MSK: No e/c/c Neuro: Alert to name, extremely hard of  hearing, follows commands  Labs on Admission: I have personally reviewed following labs and imaging studies  CBC: Recent Labs  Lab 03/27/21 1420 03/28/21 1048  WBC 7.9 5.9  NEUTROABS 4.0 3.6  HGB 11.3* 13.2  HCT 36.0 38.0  MCV 93.8 91.1  PLT 373 143*   Basic Metabolic Panel: Recent Labs  Lab 03/27/21 1420 03/28/21 1048  NA 139 128*  K 4.3 3.3*  CL 101 89*  CO2 30 18*  GLUCOSE 144* 199*  BUN 15 22  CREATININE 0.78 2.67*  CALCIUM 9.4 9.5   GFR: Estimated Creatinine Clearance: 12.1 mL/min (A) (by C-G formula based on SCr of 2.67 mg/dL (H)). Liver  Function Tests: Recent Labs  Lab 03/28/21 1048  AST 70*  ALT 38  ALKPHOS 91  BILITOT 1.3*  PROT 9.0*  ALBUMIN 4.7   No results for input(s): LIPASE, AMYLASE in the last 168 hours. No results for input(s): AMMONIA in the last 168 hours. Coagulation Profile: No results for input(s): INR, PROTIME in the last 168 hours. Cardiac Enzymes: No results for input(s): CKTOTAL, CKMB, CKMBINDEX, TROPONINI in the last 168 hours. BNP (last 3 results) No results for input(s): PROBNP in the last 8760 hours. HbA1C: No results for input(s): HGBA1C in the last 72 hours. CBG: No results for input(s): GLUCAP in the last 168 hours. Lipid Profile: No results for input(s): CHOL, HDL, LDLCALC, TRIG, CHOLHDL, LDLDIRECT in the last 72 hours. Thyroid Function Tests: No results for input(s): TSH, T4TOTAL, FREET4, T3FREE, THYROIDAB in the last 72 hours. Anemia Panel: No results for input(s): VITAMINB12, FOLATE, FERRITIN, TIBC, IRON, RETICCTPCT in the last 72 hours. Urine analysis:    Component Value Date/Time   COLORURINE YELLOW 03/28/2021 1200   APPEARANCEUR CLOUDY (A) 03/28/2021 1200   LABSPEC 1.015 03/28/2021 1200   PHURINE 7.0 03/28/2021 1200   GLUCOSEU NEGATIVE 03/28/2021 1200   HGBUR TRACE (A) 03/28/2021 1200   BILIRUBINUR NEGATIVE 03/28/2021 1200   KETONESUR NEGATIVE 03/28/2021 1200   PROTEINUR 30 (A) 03/28/2021 1200   NITRITE  POSITIVE (A) 03/28/2021 1200   LEUKOCYTESUR LARGE (A) 03/28/2021 1200    Radiological Exams on Admission: CT Head Wo Contrast  Result Date: 03/27/2021 CLINICAL DATA:  Fall from wheelchair. EXAM: CT HEAD WITHOUT CONTRAST TECHNIQUE: Contiguous axial images were obtained from the base of the skull through the vertex without intravenous contrast. COMPARISON:  03/14/2021 FINDINGS: The study is mildly motion degraded despite repeat imaging. Brain: There is no evidence of an acute infarct, intracranial hemorrhage, mass, midline shift, or extra-axial fluid collection. Advanced cerebral atrophy is again noted. Confluent hypodensities in the cerebral white matter bilaterally are similar to the prior study and are nonspecific but compatible with severe chronic small vessel ischemic disease. There are chronic lacunar infarcts in the thalami and inferior right cerebellum. Vascular: Calcified atherosclerosis at the skull base. No hyperdense vessel. Skull: No fracture or suspicious osseous lesion. Sinuses/Orbits: Large volume fluid in the left sphenoid sinus, slightly increased from the prior CT. Clear mastoid air cells. Bilateral cataract extraction. Other: None. IMPRESSION: 1. No evidence of acute intracranial abnormality. 2. Severe chronic small vessel ischemic disease and advanced cerebral atrophy. 3. Increased fluid in the left sphenoid sinus. Correlate for acute sinusitis. Electronically Signed   By: Sebastian Ache M.D.   On: 03/27/2021 15:04   DG Chest Port 1 View  Result Date: 03/28/2021 CLINICAL DATA:  Cough EXAM: PORTABLE CHEST 1 VIEW COMPARISON:  03/14/2021 FINDINGS: The heart size and mediastinal contours are within normal limits. Mild, diffuse bilateral interstitial pulmonary opacity on low volume AP portable. The visualized skeletal structures are unremarkable. IMPRESSION: Mild, diffuse bilateral interstitial pulmonary opacity on low volume AP portable, likely mild edema. No focal airspace opacity.  Electronically Signed   By: Lauralyn Primes M.D.   On: 03/28/2021 11:55   DG Knee Complete 4 Views Left  Result Date: 03/27/2021 CLINICAL DATA:  Fall with LEFT knee pain. Fall from wheelchair in a 87 year old female. EXAM: LEFT KNEE - COMPLETE 4+ VIEW COMPARISON:  None FINDINGS: Four views LEFT knee without sign of fracture, dislocation or joint effusion. Osteopenia. Mild degenerative changes. IMPRESSION: No fracture or dislocation. Osteopenia and mild degenerative changes. Electronically Signed  By: Donzetta KohutGeoffrey  Wile M.D.   On: 03/27/2021 15:05   CT Renal Stone Study  Result Date: 03/28/2021 CLINICAL DATA:  Fluid.  Co UTI EXAM: CT ABDOMEN AND PELVIS WITHOUT CONTRAST TECHNIQUE: Multidetector CT imaging of the abdomen and pelvis was performed following the standard protocol without IV contrast. COMPARISON:  03/14/2021 FINDINGS: Lower chest: Small right pleural effusion associated atelectasis or consolidation. Cardiomegaly. Large, partially imaged hiatal hernia with intrathoracic position of the gastric body and fundus. Hepatobiliary: No focal liver abnormality is seen. Status post cholecystectomy. Postoperative biliary dilatation. Pancreas: Unremarkable. No pancreatic ductal dilatation or surrounding inflammatory changes. Spleen: Normal in size without significant abnormality. Adrenals/Urinary Tract: Adrenal glands are unremarkable. Large, exophytic cyst of the inferior pole of the right kidney measuring at least 11.6 cm. No urinary tract calculi or hydronephrosis. Bladder is unremarkable. Stomach/Bowel: Stomach is within normal limits. Appendix appears normal. No evidence of bowel wall thickening, distention, or inflammatory changes. Vascular/Lymphatic: Aortic atherosclerosis. No enlarged abdominal or pelvic lymph nodes. Reproductive: Status post hysterectomy. Other: No abdominal wall hernia or abnormality. No abdominopelvic ascites. Musculoskeletal: No acute or significant osseous findings. IMPRESSION: 1. Small  right pleural effusion with associated atelectasis or consolidation, slightly diminished compared to prior examination. 2. Large, partially imaged hiatal hernia with intrathoracic position of the gastric body and fundus. 3. No urinary tract calculi or hydronephrosis. Large, exophytic cyst of the inferior pole of the right kidney measuring at least 11.6 cm, possibly symptomatic due to large size. 4. Status post cholecystectomy and hysterectomy. Aortic Atherosclerosis (ICD10-I70.0). Electronically Signed   By: Lauralyn PrimesAlex  Bibbey M.D.   On: 03/28/2021 13:48    EKG: None obtained in ED  Assessment/Plan UTI SIRS     - admit to inpt, progressive     - continue rocephin     - follow Ucx, Bld Cx     - fluids  Acute on chronic metabolic encephalopathy     - history of dementia: continue home meds     - presumably, increase in confusion is from infection     - no focality on exam; CTH from yesterday is clear     - consider rpt CTH in AM  AKI Metabolic acidosis     - baseline SCr 0.6 - 0.8; she's 2.67 at presentation     - fluids     - check renal US; follow labs  Hyperglycemia     - no home DM meds; check A1c  Hypothyroidism     - continue home synthroid  Hypokalemia Hyponatremia     - replace K+, check Mg2+     - change fluids to NS; follow Na+  Thrombocytopenia     - mild, no evidence of bleed, follow  DVT prophylaxis: SCDs  Code Status: DNR Family Communication: None at bedside  Consults called: None   Status is: Inpatient  Remains inpatient appropriate because:Inpatient level of care appropriate due to severity of illness  Dispo: The patient is from: SNF              Anticipated d/c is to: SNF              Patient currently is not medically stable to d/c.   Difficult to place patient No  Time spent coordinating admission: 70 minutes  Brinna Divelbiss A Ziyana Morikawa DO [facility]  If 7PM-7AM, please contact night-coverage [email or web address]  03/28/2021, 2:33 PM

## 2021-03-28 NOTE — ED Notes (Signed)
Priscella Mann, RN was sent a secure message in regards to room assignment for admission

## 2021-03-28 NOTE — ED Triage Notes (Signed)
Patient arrives via EMS from [place]- Patient had a fall yesterday and was evaluated at [facility].  She was diagnosed with UTI and started on antibiotics.  Patient has history of dementia and the staff reported that they felt like she was more altered today.

## 2021-03-28 NOTE — ED Notes (Addendum)
Pt arrived with soiled brief. Pt had large bm. Pt was cleaned, dried, and barrier cream applied to entirety of pt bottom. Pt noticeably red and has beginning stages of bedsore opening. Pt placed on purewick due to incontinence. New and clean brief and bedpad placed. Pt rectal temp 98.0

## 2021-03-28 NOTE — ED Provider Notes (Signed)
[facility]-EMERGENCY DEPT Provider Note   CSN: [id number] Arrival date & time: 03/28/21  1023     History Chief Complaint  Patient presents with   Altered Mental Status    Dessiree Sze is a 87 y.o. female.  87 year old female seen yesterday at droppage after having a fall.  Diagnosed with UTI and placed on antibiotics.  Had negative imaging of her head at that time.  Does have a history of dementia and was sent from nursing facility after concern that patient has been slightly altered.  On arrival here, patient is hard of hearing but does answer questions appropriately.  She denies any complaints at this time      Past Medical History:  Diagnosis Date   Anemia    Anxiety    Cerebral infarct (HCC)    Dementia (HCC)    Dementia with behavioral problem (HCC)    GERD (gastroesophageal reflux disease)    Hyperlipidemia    Osteoporosis    PAF (paroxysmal atrial fibrillation) (HCC)    Stroke (HCC)    Thyroid disease    Ulcerative esophagitis    UTI (urinary tract infection)    Vertigo     Patient Active Problem List   Diagnosis Date Noted   Multifocal pneumonia 03/06/2021   Sepsis (HCC) 11/10/2019   AKI (acute kidney injury) (HCC) 11/10/2019   Fall 08/13/2019   Ingrown nail 11/12/2013   Compression fracture of spine (HCC) 11/07/2013   Hypothyroidism 11/07/2013   Hyperlipidemia    Dementia with behavioral problem (HCC)    GERD (gastroesophageal reflux disease)    PAF (paroxysmal atrial fibrillation) (HCC)    Generalized anxiety disorder 04/05/2013    Past Surgical History:  Procedure Laterality Date   ABDOMINAL HYSTERECTOMY     APPENDECTOMY     CHOLECYSTECTOMY       OB History   No obstetric history on file.     Family History  Problem Relation Age of Onset   Depression Mother    Bipolar disorder Daughter    Drug abuse Son     Social History   Tobacco Use   Smoking status: Never   Smokeless tobacco: Never  Substance Use Topics    Alcohol use: No   Drug use: No    Home Medications Prior to Admission medications   Medication Sig Start Date End Date Taking? Authorizing Provider  acetaminophen (TYLENOL) 650 MG CR tablet Take 650 mg by mouth 3 (three) times daily.    [provider]  ALPRAZolam Prudy Feeler) 0.5 MG tablet Take 0.5 mg by mouth 2 (two) times daily.    [provider]  alum & mag hydroxide-simeth (MAALOX/MYLANTA) 200-200-20 MG/5ML suspension Take 30 mLs by mouth every 8 (eight) hours as needed for indigestion or heartburn.     [provider]  amoxicillin-clavulanate (AUGMENTIN) 875-125 MG tablet Take 1 tablet by mouth every 12 (twelve) hours. 03/09/21   Marinda Elk, MD  atorvastatin (LIPITOR) 10 MG tablet Take 5 mg by mouth at bedtime.    [provider]  Calcium Carbonate-Vitamin D 600-400 MG-UNIT tablet Take 1 tablet by mouth 2 (two) times daily.     [provider]  cephALEXin (KEFLEX) 500 MG capsule Take 1 capsule (500 mg total) by mouth 3 (three) times daily for 7 days. 03/27/21 04/03/21  Vanetta Mulders, MD  levothyroxine (SYNTHROID, LEVOTHROID) 75 MCG tablet Take 75 mcg by mouth daily before breakfast.  02/27/13   [provider]  Lidocaine 4 %  PTCH Apply 1 patch topically See admin instructions. Apply 1 patch to affected area(s) two times a day and remove, per schedule    [provider]  loperamide (IMODIUM) 2 MG capsule Take 2-4 mg by mouth See admin instructions. Take 2 capsules (4 mg) by mouth at first loose stool, then take 1 capsule (2 mg) every 2 hours as needed for diarrhea/loose stools 11/08/18   [provider]  loratadine (CLARITIN) 10 MG tablet Take 10 mg by mouth daily.    [provider]  Melatonin 10 MG TABS Take 10 mg by mouth at bedtime.     [provider]  meloxicam (MOBIC) 15 MG tablet Take 15 mg by mouth daily. 10/30/18   [provider]  mirtazapine (REMERON) 15 MG tablet Take 15 mg by  mouth at bedtime. 03/03/21   [provider]  Multiple Vitamin (MULTIVITAMIN WITH MINERALS) TABS tablet Take 1 tablet by mouth daily.     [provider]  omeprazole (PRILOSEC) 20 MG capsule Take 20 mg by mouth 2 (two) times daily. 10/17/18   [provider]  OXYGEN Inhale 2 L into the lungs continuous.    [provider]  polyethylene glycol (MIRALAX / GLYCOLAX) packet Take 17 g by mouth daily as needed for mild constipation (MIX AND DRINK).     [provider]  senna (SENOKOT) 8.6 MG TABS tablet Take 1 tablet by mouth daily.    [provider]  traZODone (DESYREL) 150 MG tablet Take 75 mg by mouth at bedtime. 10/27/18   [provider]  venlafaxine XR (EFFEXOR-XR) 75 MG 24 hr capsule Take 75 mg by mouth daily with breakfast.    [provider]    Allergies    Penicillins, Bactrim [sulfamethoxazole-trimethoprim], Sulfa antibiotics, and Codeine  Review of Systems   Review of Systems  All other systems reviewed and are negative.  Physical Exam Updated Vital Signs BP 133/68 (BP Location: Left Arm)   Pulse 93   Temp 97.8 F (36.6 C) (Oral)   Resp 18   Ht 1.549 m (5\' 1" )   Wt 68 kg   SpO2 100%   BMI 28.34 kg/m   Physical Exam Vitals and nursing note reviewed.  Constitutional:      General: She is not in acute distress.    Appearance: Normal appearance. She is well-developed. She is not toxic-appearing.  HENT:     Head: Normocephalic and atraumatic.  Eyes:     General: Lids are normal.     Conjunctiva/sclera: Conjunctivae normal.     Pupils: Pupils are equal, round, and reactive to light.  Neck:     Thyroid: No thyroid mass.     Trachea: No tracheal deviation.  Cardiovascular:     Rate and Rhythm: Normal rate and regular rhythm.     Heart sounds: Normal heart sounds. No murmur heard.   No gallop.  Pulmonary:     Effort: Pulmonary effort is normal. No respiratory distress.     Breath sounds: Normal breath  sounds. No stridor. No decreased breath sounds, wheezing, rhonchi or rales.  Abdominal:     General: There is no distension.     Palpations: Abdomen is soft.     Tenderness: There is no abdominal tenderness. There is no rebound.  Musculoskeletal:        General: No tenderness. Normal range of motion.     Cervical back: Normal range of motion and neck supple.  Skin:    General:  Skin is warm and dry.     Findings: No abrasion or rash.  Neurological:     Mental Status: She is alert and oriented to person, place, and time.     GCS: GCS eye subscore is 4. GCS verbal subscore is 5. GCS motor subscore is 6.     Cranial Nerves: Cranial nerves are intact. No cranial nerve deficit.     Sensory: No sensory deficit.     Motor: Motor function is intact.  Psychiatric:        Attention and Perception: Attention normal.        Speech: Speech is delayed.    ED Results / Procedures / Treatments   Labs (all labs ordered are listed, but only abnormal results are displayed) Labs Reviewed  CULTURE, BLOOD (ROUTINE X 2)  CULTURE, BLOOD (ROUTINE X 2)  RESP PANEL BY RT-PCR (FLU A&B, COVID) ARPGX2  LACTIC ACID, PLASMA  CBC WITH DIFFERENTIAL/PLATELET  COMPREHENSIVE METABOLIC PANEL    EKG None  Radiology CT Head Wo Contrast  Result Date: 03/27/2021 CLINICAL DATA:  Fall from wheelchair. EXAM: CT HEAD WITHOUT CONTRAST TECHNIQUE: Contiguous axial images were obtained from the base of the skull through the vertex without intravenous contrast. COMPARISON:  03/14/2021 FINDINGS: The study is mildly motion degraded despite repeat imaging. Brain: There is no evidence of an acute infarct, intracranial hemorrhage, mass, midline shift, or extra-axial fluid collection. Advanced cerebral atrophy is again noted. Confluent hypodensities in the cerebral white matter bilaterally are similar to the prior study and are nonspecific but compatible with severe chronic small vessel ischemic disease. There are chronic lacunar  infarcts in the thalami and inferior right cerebellum. Vascular: Calcified atherosclerosis at the skull base. No hyperdense vessel. Skull: No fracture or suspicious osseous lesion. Sinuses/Orbits: Large volume fluid in the left sphenoid sinus, slightly increased from the prior CT. Clear mastoid air cells. Bilateral cataract extraction. Other: None. IMPRESSION: 1. No evidence of acute intracranial abnormality. 2. Severe chronic small vessel ischemic disease and advanced cerebral atrophy. 3. Increased fluid in the left sphenoid sinus. Correlate for acute sinusitis. Electronically Signed   By: Sebastian Ache M.D.   On: 03/27/2021 15:04   DG Knee Complete 4 Views Left  Result Date: 03/27/2021 CLINICAL DATA:  Fall with LEFT knee pain. Fall from wheelchair in a 87 year old female. EXAM: LEFT KNEE - COMPLETE 4+ VIEW COMPARISON:  None FINDINGS: Four views LEFT knee without sign of fracture, dislocation or joint effusion. Osteopenia. Mild degenerative changes. IMPRESSION: No fracture or dislocation. Osteopenia and mild degenerative changes. Electronically Signed   By: Donzetta Kohut M.D.   On: 03/27/2021 15:05    Procedures Procedures   Medications Ordered in ED Medications  0.9 %  sodium chloride infusion (has no administration in time range)    ED Course  I have reviewed the triage vital signs and the nursing notes.  Pertinent labs & imaging results that were available during my care of the patient were reviewed by me and considered in my medical decision making (see chart for details).    MDM Rules/Calculators/A&P                          Urinalysis is positive for infection.  Lactate is normal.  Given 2 g of Rocephin.  Renal CT negative.  Has evidence of acute kidney injury.  Will gently give IV hydration and admit to the hospitalist service CRITICAL CARE Performed by: Toy Baker Total critical care  time: 55 minutes Critical care time was exclusive of separately billable procedures and  treating other patients. Critical care was necessary to treat or prevent imminent or life-threatening deterioration. Critical care was time spent personally by me on the following activities: development of treatment plan with patient and/or surrogate as well as nursing, discussions with consultants, evaluation of patient's response to treatment, examination of patient, obtaining history from patient or surrogate, ordering and performing treatments and interventions, ordering and review of laboratory studies, ordering and review of radiographic studies, pulse oximetry and re-evaluation of patient's condition.  Final Clinical Impression(s) / ED Diagnoses Final diagnoses:  None    Rx / DC Orders ED Discharge Orders     None        Lorre NickAllen, Karell Tukes, MD 03/28/21 1356

## 2021-03-29 DIAGNOSIS — R7303 Prediabetes: Secondary | ICD-10-CM

## 2021-03-29 DIAGNOSIS — D696 Thrombocytopenia, unspecified: Secondary | ICD-10-CM

## 2021-03-29 DIAGNOSIS — E871 Hypo-osmolality and hyponatremia: Secondary | ICD-10-CM

## 2021-03-29 DIAGNOSIS — F0391 Unspecified dementia with behavioral disturbance: Secondary | ICD-10-CM

## 2021-03-29 DIAGNOSIS — N390 Urinary tract infection, site not specified: Principal | ICD-10-CM

## 2021-03-29 DIAGNOSIS — E876 Hypokalemia: Secondary | ICD-10-CM

## 2021-03-29 DIAGNOSIS — L8990 Pressure ulcer of unspecified site, unspecified stage: Secondary | ICD-10-CM | POA: Insufficient documentation

## 2021-03-29 LAB — CBC
HCT: 36.1 % (ref 36.0–46.0)
Hemoglobin: 11 g/dL — ABNORMAL LOW (ref 12.0–15.0)
MCH: 29.6 pg (ref 26.0–34.0)
MCHC: 30.5 g/dL (ref 30.0–36.0)
MCV: 97.3 fL (ref 80.0–100.0)
Platelets: 222 10*3/uL (ref 150–400)
RBC: 3.71 MIL/uL — ABNORMAL LOW (ref 3.87–5.11)
RDW: 18.2 % — ABNORMAL HIGH (ref 11.5–15.5)
WBC: 6.9 10*3/uL (ref 4.0–10.5)
nRBC: 0 % (ref 0.0–0.2)

## 2021-03-29 LAB — COMPREHENSIVE METABOLIC PANEL
ALT: 47 U/L — ABNORMAL HIGH (ref 0–44)
AST: 39 U/L (ref 15–41)
Albumin: 3.6 g/dL (ref 3.5–5.0)
Alkaline Phosphatase: 509 U/L — ABNORMAL HIGH (ref 38–126)
Anion gap: 6 (ref 5–15)
BUN: 11 mg/dL (ref 8–23)
CO2: 29 mmol/L (ref 22–32)
Calcium: 9.4 mg/dL (ref 8.9–10.3)
Chloride: 104 mmol/L (ref 98–111)
Creatinine, Ser: 0.71 mg/dL (ref 0.44–1.00)
GFR, Estimated: >60 mL/min (ref >60)
Glucose, Bld: 103 mg/dL — ABNORMAL HIGH (ref 70–99)
Potassium: 4.6 mmol/L (ref 3.5–5.1)
Sodium: 139 mmol/L (ref 135–145)
Total Bilirubin: 1 mg/dL (ref 0.3–1.2)
Total Protein: 7.1 g/dL (ref 6.5–8.1)

## 2021-03-29 NOTE — Assessment & Plan Note (Addendum)
-   I question whether the BMP from 10:48 AM on 03/28/2021 was the correct patient given the significant variation in lab abnormalities from prior sample - Regardless she is treated with fluids and labs this morning are reassuring; given suspected UTI oral intake is also probably decreased some and fluids are appropriate for now - resolved with IVF

## 2021-03-29 NOTE — Assessment & Plan Note (Addendum)
-   UA suggestive of infection, has been on treatment outpatient but was referred back to the hospital for concern for ongoing change in mentation - Continue Rocephin and follow-up urine culture (E coli, sens pending) -Given underlying dementia and hard of hearing, difficult to know true baseline; suspect she is pretty close

## 2021-03-29 NOTE — Progress Notes (Signed)
Progress Note    Gina Hodges   [id number]  DOB: 12-Jun-1927  DOA: 03/28/2021     1  [place]  CC: confusion  Hospital Course: Gina Hodges is a 87 yo female with PMH dementia, HLD, GERD, hypothyroidism who presented with altered mental status.  Due to severe hard of hearing and cognitive impairment, she is a difficult historian. She had recently been seen in the ER for a fall from her wheelchair.  CT head was negative at that time and she was started on antibiotics for a presumed UTI and discharged back to her nursing home.  Due to ongoing concern for altered mentation, she was sent back to the ER for further evaluation.  UA was still consistent with infection and she was started on Rocephin. She was also started on fluids for possible dehydration.  Interval History:  Resting in recliner when seen this morning.  Very hard of hearing and had to almost yell in her ear next to her but she could understand some of what I said when doing that and answered my questions and followed commands.  ROS: Constitutional: negative for chills and fevers, Respiratory: negative for cough, Cardiovascular: negative for chest pain, and Gastrointestinal: negative for abdominal pain  Assessment & Plan: * UTI (urinary tract infection) - UA suggestive of infection, has been on treatment outpatient but was referred back to the hospital for concern for ongoing change in mentation - Continue Rocephin and follow-up urine culture -Given underlying dementia and hard of hearing, difficult to know true baseline; suspect she is pretty close  AKI (acute kidney injury) (HCC) - I question whether the BMP from 10:48 AM on 03/28/2021 was the correct patient given the significant variation in lab abnormalities from prior sample - Regardless she is treated with fluids and labs this morning are reassuring; given suspected UTI oral intake is also probably decreased some and fluids are appropriate  for now  Prediabetes - Some hyperglycemia noted on admission - A1c 5.9% - Continue diet control  Hyponatremia - From presumed hypovolemia - Continue fluids  Hypokalemia On daily potassium supplementation per MAR - Hold potassium for now given slightly upper limit of normal K  Dementia with behavioral problem (HCC) - Patient has underlying chronic cognitive impairment from this and may have some acute worsening of her confusion and mood in setting of infection - CT head after fall was negative.  Do not feel strongly about repeat CT at this time; no focal deficits on my exam  Thrombocytopenia (HCC)-resolved as of 03/29/2021 - Improved from prior value, still question validity of that sample    Old records reviewed in assessment of this patient  Antimicrobials: Rocephin 03/28/2021 >> current  DVT prophylaxis: SCDs Start: 03/28/21 1619   Code Status:   Code Status: DNR Family Communication:   Disposition Plan: Status is: Inpatient  Remains inpatient appropriate because:Altered mental status, IV treatments appropriate due to intensity of illness or inability to take PO, and Inpatient level of care appropriate due to severity of illness  Dispo: The patient is from: SNF              Anticipated d/c is to: SNF              Patient currently is not medically stable to d/c.   Difficult to place patient No      Risk of unplanned readmission score: Unplanned Admission- Pilot do not use: 14.44   Objective: Blood pressure (!) 153/80, pulse 92, temperature  97.9 F (36.6 C), temperature source Oral, resp. rate 18, height  (1.549 m), weight 62.5 kg, SpO2 98 %.  Examination: General appearance: alert, cooperative, and no distress Head: Normocephalic, without obvious abnormality, atraumatic Eyes:  EOMI Lungs: clear to auscultation bilaterally Heart: regular rate and rhythm and S1, S2 normal Abdomen: normal findings: bowel sounds normal and soft, non-tender Extremities:   Thin, no edema Skin: mobility and turgor normal Neurologic: No focal deficits  Consultants:    Procedures:    Data Reviewed: I have personally reviewed following labs and imaging studies Results for orders placed or performed during the hospital encounter of 03/28/21 (from the past 24 hour(s))  Magnesium     Status: None   Collection Time: 03/28/21  4:42 PM  Result Value Ref Range   Magnesium 1.8 1.7 - 2.4 mg/dL  Culture, blood (Routine X 2) w Reflex to ID Panel     Status: None (Preliminary result)   Collection Time: 03/28/21  6:27 PM   Specimen: BLOOD  Result Value Ref Range   Specimen Description      BLOOD LEFT HAND Performed at [facility], [address] [place], [place] [address]    Special Requests      BOTTLES DRAWN AEROBIC ONLY Blood Culture adequate volume Performed at [facility], [address] [place], [place] [address]    Culture PENDING    Report Status PENDING   Comprehensive metabolic panel     Status: Abnormal   Collection Time: 03/29/21  3:51 AM  Result Value Ref Range   Sodium 139 135 - 145 mmol/L   Potassium 4.6 3.5 - 5.1 mmol/L   Chloride 104 98 - 111 mmol/L   CO2 29 22 - 32 mmol/L   Glucose, Bld 103 (H) 70 - 99 mg/dL   BUN 11 8 - 23 mg/dL   Creatinine, Ser 1.47 0.44 - 1.00 mg/dL   Calcium 9.4 8.9 - 82.9 mg/dL   Total Protein 7.1 6.5 - 8.1 g/dL   Albumin 3.6 3.5 - 5.0 g/dL   AST 39 15 - 41 U/L   ALT 47 (H) 0 - 44 U/L   Alkaline Phosphatase 509 (H) 38 - 126 U/L   Total Bilirubin 1.0 0.3 - 1.2 mg/dL   GFR, Estimated >56 >21 mL/min   Anion gap 6 5 - 15  CBC     Status: Abnormal   Collection Time: 03/29/21  3:51 AM  Result Value Ref Range   WBC 6.9 4.0 - 10.5 K/uL   RBC 3.71 (L) 3.87 - 5.11 MIL/uL   Hemoglobin 11.0 (L) 12.0 - 15.0 g/dL   HCT 30.8 65.7 - 84.6 %   MCV 97.3 80.0 - 100.0 fL   MCH 29.6 26.0 - 34.0 pg   MCHC 30.5 30.0 - 36.0 g/dL   RDW 96.2 (H) 95.2 - 84.1 %   Platelets 222 150 - 400 K/uL   nRBC  0.0 0.0 - 0.2 %    Recent Results (from the past 240 hour(s))  Urine culture     Status: Abnormal (Preliminary result)   Collection Time: 03/27/21  2:20 PM   Specimen: Urine, Clean Catch  Result Value Ref Range Status   Specimen Description   Final    URINE, CLEAN CATCH Performed at [facility], [address] [place], [place] [address]    Special Requests   Final    NONE Performed at [facility], [address], [place], [place] [address]  Culture (A)  Final    >=100,000 COLONIES/mL GRAM NEGATIVE RODS SUSCEPTIBILITIES TO FOLLOW Performed at [facility], [address] [place], [place] [address]    Report Status PENDING  Incomplete  Resp Panel by RT-PCR (Flu A&B, Covid) Nasopharyngeal Swab     Status: None   Collection Time: 03/28/21 10:48 AM   Specimen: Nasopharyngeal Swab; Nasopharyngeal(NP) swabs in vial transport medium  Result Value Ref Range Status   SARS Coronavirus 2 by RT PCR NEGATIVE NEGATIVE Final    Comment: (NOTE) SARS-CoV-2 target nucleic acids are NOT DETECTED.  The SARS-CoV-2 RNA is generally detectable in upper respiratory specimens during the acute phase of infection. The lowest concentration of SARS-CoV-2 viral copies this assay can detect is 138 copies/mL. A negative result does not preclude SARS-Cov-2 infection and should not be used as the sole basis for treatment or other patient management decisions. A negative result may occur with  improper specimen collection/handling, submission of specimen other than nasopharyngeal swab, presence of viral mutation(s) within the areas targeted by this assay, and inadequate number of viral copies(<138 copies/mL). A negative result must be combined with clinical observations, patient history, and epidemiological information. The expected result is Negative.  Fact Sheet for Patients:  BloggerCourse.com  Fact Sheet for Healthcare  Providers:  SeriousBroker.it  This test is no t yet approved or cleared by the [place] FDA and  has been authorized for detection and/or diagnosis of SARS-CoV-2 by FDA under an Emergency Use Authorization (EUA). This EUA will remain  in effect (meaning this test can be used) for the duration of the COVID-19 declaration under Section 564(b)(1) of the Act, 21 U.S.C.section 360bbb-3(b)(1), unless the authorization is terminated  or revoked sooner.       Influenza A by PCR NEGATIVE NEGATIVE Final   Influenza B by PCR NEGATIVE NEGATIVE Final    Comment: (NOTE) The Xpert Xpress SARS-CoV-2/FLU/RSV plus assay is intended as an aid in the diagnosis of influenza from Nasopharyngeal swab specimens and should not be used as a sole basis for treatment. Nasal washings and aspirates are unacceptable for Xpert Xpress SARS-CoV-2/FLU/RSV testing.  Fact Sheet for Patients: BloggerCourse.com  Fact Sheet for Healthcare Providers: SeriousBroker.it  This test is not yet approved or cleared by the [place] FDA and has been authorized for detection and/or diagnosis of SARS-CoV-2 by FDA under an Emergency Use Authorization (EUA). This EUA will remain in effect (meaning this test can be used) for the duration of the COVID-19 declaration under Section 564(b)(1) of the Act, 21 U.S.C. section 360bbb-3(b)(1), unless the authorization is terminated or revoked.  Performed at [facility], [address], [place], [place] [address]   Culture, blood (Routine X 2) w Reflex to ID Panel     Status: None (Preliminary result)   Collection Time: 03/28/21 11:20 AM   Specimen: BLOOD  Result Value Ref Range Status   Specimen Description   Final    BLOOD LEFT ARM Performed at [facility], [address] [place], [place] [address]    Special Requests   Final    BOTTLES DRAWN AEROBIC AND  ANAEROBIC Blood Culture results may not be optimal due to an inadequate volume of blood received in culture bottles Performed at [facility], [address], [place], [place] [address]    Culture   Final    NO GROWTH < 24 HOURS Performed at [facility], [address] [place], [place] [address]  Report Status PENDING  Incomplete  Culture, blood (Routine X 2) w Reflex to ID Panel     Status: None (Preliminary result)   Collection Time: 03/28/21  6:27 PM   Specimen: BLOOD  Result Value Ref Range Status   Specimen Description   Final    BLOOD LEFT HAND Performed at [facility], [address], [place], [place] [address]    Special Requests   Final    BOTTLES DRAWN AEROBIC ONLY Blood Culture adequate volume Performed at [facility], [address], [place], [place] [address]    Culture PENDING  Incomplete   Report Status PENDING  Incomplete     Radiology Studies: CT Head Wo Contrast  Result Date: 03/27/2021 CLINICAL DATA:  Fall from wheelchair. EXAM: CT HEAD WITHOUT CONTRAST TECHNIQUE: Contiguous axial images were obtained from the base of the skull through the vertex without intravenous contrast. COMPARISON:  03/14/2021 FINDINGS: The study is mildly motion degraded despite repeat imaging. Brain: There is no evidence of an acute infarct, intracranial hemorrhage, mass, midline shift, or extra-axial fluid collection. Advanced cerebral atrophy is again noted. Confluent hypodensities in the cerebral white matter bilaterally are similar to the prior study and are nonspecific but compatible with severe chronic small vessel ischemic disease. There are chronic lacunar infarcts in the thalami and inferior right cerebellum. Vascular: Calcified atherosclerosis at the skull base. No hyperdense vessel. Skull: No fracture or suspicious osseous lesion. Sinuses/Orbits: Large volume fluid in the left sphenoid sinus, slightly increased from the prior CT.  Clear mastoid air cells. Bilateral cataract extraction. Other: None. IMPRESSION: 1. No evidence of acute intracranial abnormality. 2. Severe chronic small vessel ischemic disease and advanced cerebral atrophy. 3. Increased fluid in the left sphenoid sinus. Correlate for acute sinusitis. Electronically Signed   By: Sebastian Ache M.D.   On: 03/27/2021 15:04   US RENAL  Result Date: 03/29/2021 CLINICAL DATA:  Acute kidney injury. Follow-up to CT performed earlier today. EXAM: RENAL / URINARY TRACT ULTRASOUND COMPLETE COMPARISON:  CT, 03/28/2021 at 1:26 p.m. FINDINGS: Right Kidney: Renal measurements: 8.7 x 4.4 x 4.1 cm = volume: 83 mL. Normal parenchymal echogenicity. Large cyst arises from the lower pole measuring 12 x 6 x 9 cm. No other masses, no stones and no hydronephrosis. Left Kidney: Renal measurements: 8.6 x 4.4 x 4.4 cm = volume: 88 mL. Normal parenchymal echogenicity. Exophytic cyst arises from the lateral upper pole measuring 1.5 x 1.4 x 2.0 cm. No other masses, no stones and no hydronephrosis. Bladder: Appears normal for degree of bladder distention. Other: None. IMPRESSION: 1. No acute findings.  No hydronephrosis. 2. Single bilateral renal cysts. 3. Relatively small kidneys. Electronically Signed   By: Amie Portland M.D.   On: 03/29/2021 07:03   DG Chest Port 1 View  Result Date: 03/28/2021 CLINICAL DATA:  Cough EXAM: PORTABLE CHEST 1 VIEW COMPARISON:  03/14/2021 FINDINGS: The heart size and mediastinal contours are within normal limits. Mild, diffuse bilateral interstitial pulmonary opacity on low volume AP portable. The visualized skeletal structures are unremarkable. IMPRESSION: Mild, diffuse bilateral interstitial pulmonary opacity on low volume AP portable, likely mild edema. No focal airspace opacity. Electronically Signed   By: Lauralyn Primes M.D.   On: 03/28/2021 11:55   DG Knee Complete 4 Views Left  Result Date: 03/27/2021 CLINICAL DATA:  Fall with LEFT knee pain. Fall from wheelchair  in a 87 year old female. EXAM: LEFT KNEE - COMPLETE 4+ VIEW COMPARISON:  None FINDINGS: Four views LEFT  knee without sign of fracture, dislocation or joint effusion. Osteopenia. Mild degenerative changes. IMPRESSION: No fracture or dislocation. Osteopenia and mild degenerative changes. Electronically Signed   By: Donzetta Kohut M.D.   On: 03/27/2021 15:05   CT Renal Stone Study  Result Date: 03/28/2021 CLINICAL DATA:  Fluid.  Co UTI EXAM: CT ABDOMEN AND PELVIS WITHOUT CONTRAST TECHNIQUE: Multidetector CT imaging of the abdomen and pelvis was performed following the standard protocol without IV contrast. COMPARISON:  03/14/2021 FINDINGS: Lower chest: Small right pleural effusion associated atelectasis or consolidation. Cardiomegaly. Large, partially imaged hiatal hernia with intrathoracic position of the gastric body and fundus. Hepatobiliary: No focal liver abnormality is seen. Status post cholecystectomy. Postoperative biliary dilatation. Pancreas: Unremarkable. No pancreatic ductal dilatation or surrounding inflammatory changes. Spleen: Normal in size without significant abnormality. Adrenals/Urinary Tract: Adrenal glands are unremarkable. Large, exophytic cyst of the inferior pole of the right kidney measuring at least 11.6 cm. No urinary tract calculi or hydronephrosis. Bladder is unremarkable. Stomach/Bowel: Stomach is within normal limits. Appendix appears normal. No evidence of bowel wall thickening, distention, or inflammatory changes. Vascular/Lymphatic: Aortic atherosclerosis. No enlarged abdominal or pelvic lymph nodes. Reproductive: Status post hysterectomy. Other: No abdominal wall hernia or abnormality. No abdominopelvic ascites. Musculoskeletal: No acute or significant osseous findings. IMPRESSION: 1. Small right pleural effusion with associated atelectasis or consolidation, slightly diminished compared to prior examination. 2. Large, partially imaged hiatal hernia with intrathoracic position of  the gastric body and fundus. 3. No urinary tract calculi or hydronephrosis. Large, exophytic cyst of the inferior pole of the right kidney measuring at least 11.6 cm, possibly symptomatic due to large size. 4. Status post cholecystectomy and hysterectomy. Aortic Atherosclerosis (ICD10-I70.0). Electronically Signed   By: Lauralyn Primes M.D.   On: 03/28/2021 13:48   US RENAL  Final Result    CT Renal Stone Study  Final Result    DG Chest Port 1 View  Final Result      Scheduled Meds:  ALPRAZolam  0.5 mg Oral BID   calcium-vitamin D  1 tablet Oral BID   hydrocortisone cream   Topical BID   levothyroxine  75 mcg Oral Q0600   loratadine  10 mg Oral Q0600   melatonin  10 mg Oral QHS   mirtazapine  15 mg Oral QHS   multivitamin with minerals  1 tablet Oral Q0600   senna  1 tablet Oral Q0600   traZODone  75 mg Oral QHS   venlafaxine XR  75 mg Oral Q0600   PRN Meds: acetaminophen **OR** acetaminophen, liver oil-zinc oxide, ondansetron **OR** ondansetron (ZOFRAN) IV Continuous Infusions:  sodium chloride 75 mL/hr at 03/29/21 0613   cefTRIAXone (ROCEPHIN)  IV Stopped (03/28/21 1508)     LOS: 1 day  Time spent: Greater than 50% of the 35 minute visit was spent in counseling/coordination of care for the patient as laid out in the A&P.   Lewie Chamber, MD [facility] 03/29/2021, 12:11 PM

## 2021-03-29 NOTE — Hospital Course (Signed)
Gina Hodges is a 87 yo female with PMH dementia, HLD, GERD, hypothyroidism who presented with altered mental status.  Due to severe hard of hearing and cognitive impairment, she is a difficult historian. She had recently been seen in the ER for a fall from her wheelchair.  CT head was negative at that time and she was started on antibiotics for a presumed UTI and discharged back to her nursing home.  Due to ongoing concern for altered mentation, she was sent back to the ER for further evaluation.  UA was still consistent with infection and she was started on Rocephin. She was also started on fluids for possible dehydration.

## 2021-03-29 NOTE — Progress Notes (Signed)
RN noted patient coughing when eating lunch.  Patient has no teeth.  Call placed to [facility], spoke with nurse at [facility] who advised patient is on a modified diet with chopped meats and nectar thick liquids.  Notified physician of above, diet order changed to reflect this.

## 2021-03-29 NOTE — Assessment & Plan Note (Signed)
-   Improved from prior value, still question validity of that sample

## 2021-03-29 NOTE — Assessment & Plan Note (Signed)
-   Some hyperglycemia noted on admission - A1c 5.9% - Continue diet control

## 2021-03-29 NOTE — Plan of Care (Signed)
  Problem: Education: Goal: Knowledge of General Education information will improve Description: Including pain rating scale, medication(s)/side effects and non-pharmacologic comfort measures Outcome: Progressing   Problem: Activity: Goal: Risk for activity intolerance will decrease Outcome: Progressing   Problem: Nutrition: Goal: Adequate nutrition will be maintained Outcome: Progressing   Problem: Elimination: Goal: Will not experience complications related to bowel motility Outcome: Progressing Goal: Will not experience complications related to urinary retention Outcome: Progressing   Problem: Safety: Goal: Ability to remain free from injury will improve Outcome: Progressing   Problem: Skin Integrity: Goal: Risk for impaired skin integrity will decrease Outcome: Progressing   Problem: Urinary Elimination: Goal: Signs and symptoms of infection will decrease Outcome: Progressing   

## 2021-03-29 NOTE — Assessment & Plan Note (Addendum)
-   Patient has underlying chronic cognitive impairment from this and may have some acute worsening of her confusion and mood in setting of infection - CT head after fall was negative.  Do not feel strongly about repeat CT at this time; no focal deficits on my exam - she appears to be back to her baseline; follows commands well, just very hard of hearing

## 2021-03-29 NOTE — Progress Notes (Signed)
Patient becoming increasingly agitated. Patient swinging and kicking at staff when performing incontinence care. Posey mitts applied to patient. Will continue to monitor.

## 2021-03-29 NOTE — Assessment & Plan Note (Signed)
On daily potassium supplementation per MAR - Hold potassium for now given slightly upper limit of normal K

## 2021-03-29 NOTE — Assessment & Plan Note (Addendum)
-   From presumed hypovolemia - resolved with IVF

## 2021-03-30 LAB — BASIC METABOLIC PANEL
Anion gap: 5 (ref 5–15)
BUN: 11 mg/dL (ref 8–23)
CO2: 28 mmol/L (ref 22–32)
Calcium: 9 mg/dL (ref 8.9–10.3)
Chloride: 107 mmol/L (ref 98–111)
Creatinine, Ser: 0.72 mg/dL (ref 0.44–1.00)
GFR, Estimated: >60 mL/min (ref >60)
Glucose, Bld: 97 mg/dL (ref 70–99)
Potassium: 4.7 mmol/L (ref 3.5–5.1)
Sodium: 140 mmol/L (ref 135–145)

## 2021-03-30 LAB — CBC WITH DIFFERENTIAL/PLATELET
Abs Immature Granulocytes: 0.02 10*3/uL (ref 0.00–0.07)
Basophils Absolute: 0 10*3/uL (ref 0.0–0.1)
Basophils Relative: 0 %
Eosinophils Absolute: 1.8 10*3/uL — ABNORMAL HIGH (ref 0.0–0.5)
Eosinophils Relative: 23 %
HCT: 32.9 % — ABNORMAL LOW (ref 36.0–46.0)
Hemoglobin: 10.2 g/dL — ABNORMAL LOW (ref 12.0–15.0)
Immature Granulocytes: 0 %
Lymphocytes Relative: 23 %
Lymphs Abs: 1.8 10*3/uL (ref 0.7–4.0)
MCH: 29.8 pg (ref 26.0–34.0)
MCHC: 31 g/dL (ref 30.0–36.0)
MCV: 96.2 fL (ref 80.0–100.0)
Monocytes Absolute: 1 10*3/uL (ref 0.1–1.0)
Monocytes Relative: 13 %
Neutro Abs: 3.2 10*3/uL (ref 1.7–7.7)
Neutrophils Relative %: 41 %
Platelets: 309 10*3/uL (ref 150–400)
RBC: 3.42 MIL/uL — ABNORMAL LOW (ref 3.87–5.11)
RDW: 18.3 % — ABNORMAL HIGH (ref 11.5–15.5)
WBC: 7.9 10*3/uL (ref 4.0–10.5)
nRBC: 0 % (ref 0.0–0.2)

## 2021-03-30 LAB — HEMOGLOBIN A1C
Hgb A1c MFr Bld: 5.6 % (ref 4.8–5.6)
Mean Plasma Glucose: 114 mg/dL

## 2021-03-30 LAB — URINE CULTURE: Culture: >=100000 — AB

## 2021-03-30 LAB — MAGNESIUM: Magnesium: 1.7 mg/dL (ref 1.7–2.4)

## 2021-03-30 NOTE — Plan of Care (Signed)
  Problem: Education: Goal: Knowledge of General Education information will improve Description: Including pain rating scale, medication(s)/side effects and non-pharmacologic comfort measures Outcome: Progressing   Problem: Activity: Goal: Risk for activity intolerance will decrease Outcome: Progressing   Problem: Nutrition: Goal: Adequate nutrition will be maintained Outcome: Progressing   Problem: Elimination: Goal: Will not experience complications related to bowel motility Outcome: Progressing Goal: Will not experience complications related to urinary retention Outcome: Progressing   Problem: Safety: Goal: Ability to remain free from injury will improve Outcome: Progressing   Problem: Skin Integrity: Goal: Risk for impaired skin integrity will decrease Outcome: Progressing   Problem: Urinary Elimination: Goal: Signs and symptoms of infection will decrease Outcome: Progressing

## 2021-03-30 NOTE — Progress Notes (Signed)
Progress Note    Gina Hodges   EUM:[id number]  DOB: Jul 24, 1927  DOA: 03/28/2021     2  PCP: Lottie Dawson  CC: confusion  Hospital Course: Gina Hodges is a 87 yo female with PMH dementia, HLD, GERD, hypothyroidism who presented with altered mental status.  Due to severe hard of hearing and cognitive impairment, she is a difficult historian. She had recently been seen in the ER for a fall from her wheelchair.  CT head was negative at that time and she was started on antibiotics for a presumed UTI and discharged back to her nursing home.  Due to ongoing concern for altered mentation, she was sent back to the ER for further evaluation.  UA was still consistent with infection and she was started on Rocephin. She was also started on fluids for possible dehydration.  Interval History:  Resting in bed. No events overnight. Follows my commands when she can hear/understand me due to her hardness of hearing.   ROS: Constitutional: negative for chills and fevers, Respiratory: negative for cough, Cardiovascular: negative for chest pain, and Gastrointestinal: negative for abdominal pain  Assessment & Plan: * UTI (urinary tract infection) - UA suggestive of infection, has been on treatment outpatient but was referred back to the hospital for concern for ongoing change in mentation - Continue Rocephin and follow-up urine culture (E coli, sens pending) -Given underlying dementia and hard of hearing, difficult to know true baseline; suspect she is pretty close  AKI (acute kidney injury) (HCC)-resolved as of 03/30/2021 - I question whether the BMP from 10:48 AM on 03/28/2021 was the correct patient given the significant variation in lab abnormalities from prior sample - Regardless she is treated with fluids and labs this morning are reassuring; given suspected UTI oral intake is also probably decreased some and fluids are appropriate for now - resolved with IVF  Prediabetes - Some  hyperglycemia noted on admission - A1c 5.9% - Continue diet control  Hypokalemia On daily potassium supplementation per MAR - Hold potassium for now given slightly upper limit of normal K  Dementia with behavioral problem (HCC) - Patient has underlying chronic cognitive impairment from this and may have some acute worsening of her confusion and mood in setting of infection - CT head after fall was negative.  Do not feel strongly about repeat CT at this time; no focal deficits on my exam - she appears to be back to her baseline; follows commands well, just very hard of hearing  Thrombocytopenia (HCC)-resolved as of 03/29/2021 - Improved from prior value, still question validity of that sample  Hyponatremia-resolved as of 03/30/2021 - From presumed hypovolemia - resolved with IVF   Old records reviewed in assessment of this patient  Antimicrobials: Rocephin 03/28/2021 >> current  DVT prophylaxis: SCDs Start: 03/28/21 1619   Code Status:   Code Status: DNR Family Communication:   Disposition Plan: Status is: Inpatient  Remains inpatient appropriate because:Altered mental status, IV treatments appropriate due to intensity of illness or inability to take PO, and Inpatient level of care appropriate due to severity of illness  Dispo: The patient is from: SNF              Anticipated d/c is to: SNF              Patient currently is not medically stable to d/c.   Difficult to place patient No  Risk of unplanned readmission score: Unplanned Admission- Pilot do not use: 16.29   Objective: Blood  pressure 130/84, pulse 97, temperature 97.8 F (36.6 C), temperature source Oral, resp. rate 18, height 5\' 1"  (1.549 m), weight 62.5 kg, SpO2 99 %.  Examination: General appearance: alert, cooperative, and no distress Head: Normocephalic, without obvious abnormality, atraumatic Eyes:  EOMI Lungs: clear to auscultation bilaterally Heart: regular rate and rhythm and S1, S2 normal Abdomen:  normal findings: bowel sounds normal and soft, non-tender Extremities:  Thin, no edema Skin: mobility and turgor normal Neurologic: No focal deficits  Consultants:    Procedures:    Data Reviewed: I have personally reviewed following labs and imaging studies Results for orders placed or performed during the hospital encounter of 03/28/21 (from the past 24 hour(s))  Basic metabolic panel     Status: None   Collection Time: 03/30/21  3:47 AM  Result Value Ref Range   Sodium 140 135 - 145 mmol/L   Potassium 4.7 3.5 - 5.1 mmol/L   Chloride 107 98 - 111 mmol/L   CO2 28 22 - 32 mmol/L   Glucose, Bld 97 70 - 99 mg/dL   BUN 11 8 - 23 mg/dL   Creatinine, Ser 04/01/21 0.44 - 1.00 mg/dL   Calcium 9.0 8.9 - 5.36 mg/dL   GFR, Estimated 46.8 >03 mL/min   Anion gap 5 5 - 15  CBC with Differential/Platelet     Status: Abnormal   Collection Time: 03/30/21  3:47 AM  Result Value Ref Range   WBC 7.9 4.0 - 10.5 K/uL   RBC 3.42 (L) 3.87 - 5.11 MIL/uL   Hemoglobin 10.2 (L) 12.0 - 15.0 g/dL   HCT 04/01/21 (L) 22.4 - 82.5 %   MCV 96.2 80.0 - 100.0 fL   MCH 29.8 26.0 - 34.0 pg   MCHC 31.0 30.0 - 36.0 g/dL   RDW 00.3 (H) 70.4 - 88.8 %   Platelets 309 150 - 400 K/uL   nRBC 0.0 0.0 - 0.2 %   Neutrophils Relative % 41 %   Neutro Abs 3.2 1.7 - 7.7 K/uL   Lymphocytes Relative 23 %   Lymphs Abs 1.8 0.7 - 4.0 K/uL   Monocytes Relative 13 %   Monocytes Absolute 1.0 0.1 - 1.0 K/uL   Eosinophils Relative 23 %   Eosinophils Absolute 1.8 (H) 0.0 - 0.5 K/uL   Basophils Relative 0 %   Basophils Absolute 0.0 0.0 - 0.1 K/uL   Immature Granulocytes 0 %   Abs Immature Granulocytes 0.02 0.00 - 0.07 K/uL  Magnesium     Status: None   Collection Time: 03/30/21  3:47 AM  Result Value Ref Range   Magnesium 1.7 1.7 - 2.4 mg/dL    Recent Results (from the past 240 hour(s))  Urine culture     Status: Abnormal   Collection Time: 03/27/21  2:20 PM   Specimen: Urine, Clean Catch  Result Value Ref Range Status    Specimen Description   Final    URINE, CLEAN CATCH Performed at Med 03/29/21, [address], [place], [place]    Special Requests   Final    NONE Performed at [facility], [address] [place], [place]    Culture >=100,000 COLONIES/mL ESCHERICHIA COLI (A)  Final   Report Status 03/30/2021 FINAL  Final   Organism ID, Bacteria ESCHERICHIA COLI (A)  Final      Susceptibility   Escherichia coli - MIC*    AMPICILLIN <=2 SENSITIVE Sensitive     CEFAZOLIN <=4 SENSITIVE Sensitive  CEFEPIME <=0.12 SENSITIVE Sensitive     CEFTRIAXONE <=0.25 SENSITIVE Sensitive     CIPROFLOXACIN <=0.25 SENSITIVE Sensitive     GENTAMICIN <=1 SENSITIVE Sensitive     IMIPENEM <=0.25 SENSITIVE Sensitive     NITROFURANTOIN <=16 SENSITIVE Sensitive     TRIMETH/SULFA <=20 SENSITIVE Sensitive     AMPICILLIN/SULBACTAM <=2 SENSITIVE Sensitive     PIP/TAZO <=4 SENSITIVE Sensitive     * >=100,000 COLONIES/mL ESCHERICHIA COLI  Resp Panel by RT-PCR (Flu A&B, Covid) Nasopharyngeal Swab     Status: None   Collection Time: 03/28/21 10:48 AM   Specimen: Nasopharyngeal Swab; Nasopharyngeal(NP) swabs in vial transport medium  Result Value Ref Range Status   SARS Coronavirus 2 by RT PCR NEGATIVE NEGATIVE Final    Comment: (NOTE) SARS-CoV-2 target nucleic acids are NOT DETECTED.  The SARS-CoV-2 RNA is generally detectable in upper respiratory specimens during the acute phase of infection. The lowest concentration of SARS-CoV-2 viral copies this assay can detect is 138 copies/mL. A negative result does not preclude SARS-Cov-2 infection and should not be used as the sole basis for treatment or other patient management decisions. A negative result may occur with  improper specimen collection/handling, submission of specimen other than nasopharyngeal swab, presence of viral mutation(s) within the areas targeted by this assay, and inadequate number of  viral copies(<138 copies/mL). A negative result must be combined with clinical observations, patient history, and epidemiological information. The expected result is Negative.  Fact Sheet for Patients:  BloggerCourse.com  Fact Sheet for Healthcare Providers:  SeriousBroker.it  This test is no t yet approved or cleared by the [place] FDA and  has been authorized for detection and/or diagnosis of SARS-CoV-2 by FDA under an Emergency Use Authorization (EUA). This EUA will remain  in effect (meaning this test can be used) for the duration of the COVID-19 declaration under Section 564(b)(1) of the Act, 21 U.S.C.section 360bbb-3(b)(1), unless the authorization is terminated  or revoked sooner.       Influenza A by PCR NEGATIVE NEGATIVE Final   Influenza B by PCR NEGATIVE NEGATIVE Final    Comment: (NOTE) The Xpert Xpress SARS-CoV-2/FLU/RSV plus assay is intended as an aid in the diagnosis of influenza from Nasopharyngeal swab specimens and should not be used as a sole basis for treatment. Nasal washings and aspirates are unacceptable for Xpert Xpress SARS-CoV-2/FLU/RSV testing.  Fact Sheet for Patients: BloggerCourse.com  Fact Sheet for Healthcare Providers: SeriousBroker.it  This test is not yet approved or cleared by the [place] FDA and has been authorized for detection and/or diagnosis of SARS-CoV-2 by FDA under an Emergency Use Authorization (EUA). This EUA will remain in effect (meaning this test can be used) for the duration of the COVID-19 declaration under Section 564(b)(1) of the Act, 21 U.S.C. section 360bbb-3(b)(1), unless the authorization is terminated or revoked.  Performed at [facility], [address] [place], [place] [address]   Culture, blood (Routine X 2) w Reflex to ID Panel     Status: None (Preliminary result)   Collection  Time: 03/28/21 11:20 AM   Specimen: BLOOD  Result Value Ref Range Status   Specimen Description   Final    BLOOD LEFT ARM Performed at [facility], [address] [place], [place] [address]    Special Requests   Final    BOTTLES DRAWN AEROBIC AND ANAEROBIC Blood Culture results may not be optimal due to an inadequate volume of blood received in culture bottles Performed  at [facility], [address] [place], [place] [address]    Culture   Final    NO GROWTH 2 DAYS Performed at [facility], [address] [place], [place] [address]    Report Status PENDING  Incomplete  Culture, Urine     Status: Abnormal (Preliminary result)   Collection Time: 03/28/21 12:00 PM   Specimen: Urine, Random  Result Value Ref Range Status   Specimen Description   Final    URINE, RANDOM Performed at [facility], [address] [place], [place] [address]    Special Requests   Final    NONE Performed at [facility], [address] [place], [place] [address]    Culture (A)  Final    >=100,000 COLONIES/mL ESCHERICHIA COLI SUSCEPTIBILITIES TO FOLLOW Performed at [facility], [address] [place], [place] [address]    Report Status PENDING  Incomplete  Culture, blood (Routine X 2) w Reflex to ID Panel     Status: None (Preliminary result)   Collection Time: 03/28/21  6:27 PM   Specimen: BLOOD  Result Value Ref Range Status   Specimen Description   Final    BLOOD LEFT HAND Performed at [facility], [address] [place], [place] [address]    Special Requests   Final    BOTTLES DRAWN AEROBIC ONLY Blood Culture adequate volume   Culture   Final    NO GROWTH 1 DAY Performed at [facility], [address] [place], [place] [address]    Report Status PENDING  Incomplete     Radiology Studies: US RENAL  Result Date: 03/29/2021 CLINICAL DATA:  Acute kidney injury.  Follow-up to CT performed earlier today. EXAM: RENAL / URINARY TRACT ULTRASOUND COMPLETE COMPARISON:  CT, 03/28/2021 at 1:26 p.m. FINDINGS: Right Kidney: Renal measurements: 8.7 x 4.4 x 4.1 cm = volume: 83 mL. Normal parenchymal echogenicity. Large cyst arises from the lower pole measuring 12 x 6 x 9 cm. No other masses, no stones and no hydronephrosis. Left Kidney: Renal measurements: 8.6 x 4.4 x 4.4 cm = volume: 88 mL. Normal parenchymal echogenicity. Exophytic cyst arises from the lateral upper pole measuring 1.5 x 1.4 x 2.0 cm. No other masses, no stones and no hydronephrosis. Bladder: Appears normal for degree of bladder distention. Other: None. IMPRESSION: 1. No acute findings.  No hydronephrosis. 2. Single bilateral renal cysts. 3. Relatively small kidneys. Electronically Signed   By: Amie Portlandavid  Ormond M.D.   On: 03/29/2021 07:03   US RENAL  Final Result    CT Renal Stone Study  Final Result    [facility]  Final Result      Scheduled Meds:  ALPRAZolam  0.5 mg Oral BID   calcium-vitamin D  1 tablet Oral BID   hydrocortisone cream   Topical BID   levothyroxine  75 mcg Oral Q0600   loratadine  10 mg Oral Q0600   melatonin  10 mg Oral QHS   mirtazapine  15 mg Oral QHS   multivitamin with minerals  1 tablet Oral Q0600   senna  1 tablet Oral Q0600   traZODone  75 mg Oral QHS   venlafaxine XR  75 mg Oral Q0600   PRN Meds: acetaminophen **OR** acetaminophen, liver oil-zinc oxide, ondansetron **OR** ondansetron (ZOFRAN) IV Continuous Infusions:  cefTRIAXone (ROCEPHIN)  IV 2 g (03/30/21 1333)     LOS: 2 days  Time spent: Greater than 50% of the 35 minute visit was spent in counseling/coordination of care for the patient as laid out in the A&P.   Lewie Chamber, MD [facility] 03/30/2021, 3:57 PM

## 2021-03-31 ENCOUNTER — Telehealth: Payer: Self-pay | Admitting: Emergency Medicine

## 2021-03-31 DIAGNOSIS — E871 Hypo-osmolality and hyponatremia: Secondary | ICD-10-CM

## 2021-03-31 DIAGNOSIS — E440 Moderate protein-calorie malnutrition: Secondary | ICD-10-CM

## 2021-03-31 DIAGNOSIS — R7303 Prediabetes: Secondary | ICD-10-CM

## 2021-03-31 DIAGNOSIS — E876 Hypokalemia: Secondary | ICD-10-CM

## 2021-03-31 LAB — URINE CULTURE: Culture: >=100000 — AB

## 2021-03-31 MED ORDER — FOOD THICKENER (SIMPLYTHICK): 1.0000 | ORAL | Status: DC | PRN

## 2021-03-31 MED ORDER — ENSURE ENLIVE PO LIQD
237.0000 mL | Freq: Two times a day (BID) | ORAL | Status: DC
  Administered 2021-04-01 – 2021-04-08 (×16): 237 mL via ORAL

## 2021-03-31 MED ORDER — CEPHALEXIN 500 MG PO CAPS
500.0000 mg | ORAL_CAPSULE | Freq: Four times a day (QID) | ORAL | Status: DC
  Administered 2021-03-31 – 2021-04-07 (×28): 500 mg via ORAL
  Filled 2021-03-31 (×28): qty 1

## 2021-03-31 NOTE — Telephone Encounter (Signed)
Post ED Visit - Positive Culture Follow-up  Culture report reviewed by antimicrobial stewardship pharmacist: [facility] []  , Pharm.D. []  Enzo Bi, Pharm.D., BCPS AQ-ID []  , Pharm.D., BCPS []  Celedonio Miyamoto, Pharm.D., BCPS []  [place], Garvin Fila.D., BCPS, AAHIVP []  , Pharm.D., BCPS, AAHIVP []  Georgina Pillion, PharmD, BCPS []  , PharmD, BCPS []  [place], PharmD, BCPS []  [place], PharmD []  , PharmD, BCPS []  Estella Husk, PharmD  Pharmacy Team []  Lysle Pearl, PharmD []  , PharmD []  Phillips Climes, PharmD []  , Rph []  Agapito Games) , PharmD []  Verlan Friends, PharmD []  , PharmD []  Mervyn Gay, PharmD []  , PharmD []  Vinnie Level, PharmD []  [facility], PharmD []  , PharmD []  Len Childs, PharmD   Positive urine culture Treated with cephalexin, organism sensitive to the same and no further patient follow-up is required at this time.  03/31/2021, 3:35 PM

## 2021-03-31 NOTE — Progress Notes (Signed)
Progress Note    Gina Hodges   IEP:[id number]  DOB: 29-Jul-1927  DOA: 03/28/2021     3  PCP: Gina Hodges  CC: confusion  [facility] Course: Gina Hodges is a 87 yo female with PMH dementia, HLD, GERD, hypothyroidism who presented with altered mental status.  Due to severe hard of hearing and cognitive impairment, she is a difficult historian. She had recently been seen in the ER for a fall from her wheelchair.  CT head was negative at that time and she was started on antibiotics for a presumed UTI and discharged back to her nursing home.  Due to ongoing concern for altered mentation, she was sent back to the ER for further evaluation.  UA was still consistent with infection and she was started on Rocephin. She was also started on fluids for possible dehydration.  Interval History:  Patient was resting comfortably when seen today.,  Very hard of hearing.  Unknown baseline.  Assessment & Plan: UTI.  Urine culture with pansensitive E. coli. -Switch to cefdinir with Keflex to complete a 5-day course.  AKI.  Most likely secondary to poor p.o. intake. Resolved with IV fluid.  Prediabetes.  A1c of 5.9. -Continue with diet control  History of hypokalemia.  Patient was on potassium supplement which was held due to upper normal limit of potassium. -Continue to monitor  Dementia with behavioral problem Gina [facility]) - Patient has underlying chronic cognitive impairment from this and may have some acute worsening of her confusion and mood in setting of infection - CT head after fall was negative.  Do not feel strongly about repeat CT at this time; no focal deficits on my exam - she appears to be back to her baseline; follows commands well, just very hard of hearing   Thrombocytopenia (HCC)-resolved as of 03/29/2021 - Improved from prior value, still question validity of that sample   Hyponatremia-resolved as of 03/30/2021 - From presumed hypovolemia - resolved with  IVF  Antimicrobials: Rocephin 03/28/2021 >> 03/31/2021 Keflex  DVT prophylaxis: SCDs Start: 03/28/21 1619   Code Status:   Code Status: DNR Family Communication:   Disposition Plan: Status is: Inpatient  Remains inpatient appropriate because:Altered mental status, IV treatments appropriate due to intensity of illness or inability to take PO, and Inpatient level of care appropriate due to severity of illness  Dispo: The patient is from: SNF              Anticipated d/c is to: SNF              Patient currently is medically stable.  TOC is waiting response from her facility.   Difficult to place patient No  Risk of unplanned readmission score: Unplanned Admission- Pilot do not use: 16.74   Objective: Blood pressure (!) 125/59, pulse 89, temperature 97.8 F (36.6 C), temperature source Oral, resp. rate 16, height 5\' 1"  (1.549 m), weight 62.5 kg, SpO2 98 %.  Examination: General.  Frail, hard of hearing elderly lady, in no acute distress. Pulmonary.  Lungs clear bilaterally, normal respiratory effort. CV.  Regular rate and rhythm, no JVD, rub or murmur. Abdomen.  Soft, nontender, nondistended, BS positive. CNS.  Alert and oriented to self.  No focal neurologic deficit. Extremities.  No edema, no cyanosis, pulses intact and symmetrical. Psychiatry.  Judgment and insight appears impaired.  Consultants:    Procedures:    Data Reviewed: I have personally reviewed following labs and imaging studies No results found for this or any previous visit (  from the past 24 hour(s)).   Recent Results (from the past 240 hour(s))  Urine culture     Status: Abnormal   Collection Time: 03/27/21  2:20 PM   Specimen: Urine, Clean Catch  Result Value Ref Range Status   Specimen Description   Final    URINE, CLEAN CATCH Performed at [facility], [address], [place], [place] [address]    Special Requests   Final    NONE Performed at [facility],  [address], [place], [place] [address]    Culture >=100,000 COLONIES/mL ESCHERICHIA COLI (A)  Final   Report Status 03/30/2021 FINAL  Final   Organism ID, Bacteria ESCHERICHIA COLI (A)  Final      Susceptibility   Escherichia coli - MIC*    AMPICILLIN <=2 SENSITIVE Sensitive     CEFAZOLIN <=4 SENSITIVE Sensitive     CEFEPIME <=0.12 SENSITIVE Sensitive     CEFTRIAXONE <=0.25 SENSITIVE Sensitive     CIPROFLOXACIN <=0.25 SENSITIVE Sensitive     GENTAMICIN <=1 SENSITIVE Sensitive     IMIPENEM <=0.25 SENSITIVE Sensitive     NITROFURANTOIN <=16 SENSITIVE Sensitive     TRIMETH/SULFA <=20 SENSITIVE Sensitive     AMPICILLIN/SULBACTAM <=2 SENSITIVE Sensitive     PIP/TAZO <=4 SENSITIVE Sensitive     * >=100,000 COLONIES/mL ESCHERICHIA COLI  Resp Panel by RT-PCR (Flu A&B, Covid) Nasopharyngeal Swab     Status: None   Collection Time: 03/28/21 10:48 AM   Specimen: Nasopharyngeal Swab; Nasopharyngeal(NP) swabs in vial transport medium  Result Value Ref Range Status   SARS Coronavirus 2 by RT PCR NEGATIVE NEGATIVE Final    Comment: (NOTE) SARS-CoV-2 target nucleic acids are NOT DETECTED.  The SARS-CoV-2 RNA is generally detectable in upper respiratory specimens during the acute phase of infection. The lowest concentration of SARS-CoV-2 viral copies this assay can detect is 138 copies/mL. A negative result does not preclude SARS-Cov-2 infection and should not be used as the sole basis for treatment or other patient management decisions. A negative result may occur with  improper specimen collection/handling, submission of specimen other than nasopharyngeal swab, presence of viral mutation(s) within the areas targeted by this assay, and inadequate number of viral copies(<138 copies/mL). A negative result must be combined with clinical observations, patient history, and epidemiological information. The expected result is Negative.  Fact Sheet for Patients:   BloggerCourse.com  Fact Sheet for Healthcare Providers:  SeriousBroker.it  This test is no t yet approved or cleared by the [place] FDA and  has been authorized for detection and/or diagnosis of SARS-CoV-2 by FDA under an Emergency Use Authorization (EUA). This EUA will remain  in effect (meaning this test can be used) for the duration of the COVID-19 declaration under Section 564(b)(1) of the Act, 21 U.S.C.section 360bbb-3(b)(1), unless the authorization is terminated  or revoked sooner.       Influenza A by PCR NEGATIVE NEGATIVE Final   Influenza B by PCR NEGATIVE NEGATIVE Final    Comment: (NOTE) The Xpert Xpress SARS-CoV-2/FLU/RSV plus assay is intended as an aid in the diagnosis of influenza from Nasopharyngeal swab specimens and should not be used as a sole basis for treatment. Nasal washings and aspirates are unacceptable for Xpert Xpress SARS-CoV-2/FLU/RSV testing.  Fact Sheet for Patients: BloggerCourse.com  Fact Sheet for Healthcare Providers: SeriousBroker.it  This test is not yet approved or cleared by the [place] FDA and has been authorized for detection and/or diagnosis of SARS-CoV-2 by FDA under an  Emergency Use Authorization (EUA). This EUA will remain in effect (meaning this test can be used) for the duration of the COVID-19 declaration under Section 564(b)(1) of the Act, 21 U.S.C. section 360bbb-3(b)(1), unless the authorization is terminated or revoked.  Performed at [facility], [address] [place], [place] [address]   Culture, blood (Routine X 2) w Reflex to ID Panel     Status: None (Preliminary result)   Collection Time: 03/28/21 11:20 AM   Specimen: BLOOD  Result Value Ref Range Status   Specimen Description   Final    BLOOD LEFT ARM Performed at [facility], [address] [place],  [place] [address]    Special Requests   Final    BOTTLES DRAWN AEROBIC AND ANAEROBIC Blood Culture results may not be optimal due to an inadequate volume of blood received in culture bottles Performed at [facility], [address] [place], [place] [address]    Culture   Final    NO GROWTH 3 DAYS Performed at [facility], [address] [place], [place] [address]    Report Status PENDING  Incomplete  Culture, Urine     Status: Abnormal   Collection Time: 03/28/21 12:00 PM   Specimen: Urine, Random  Result Value Ref Range Status   Specimen Description   Final    URINE, RANDOM Performed at [facility], [address] [place], [place] [address]    Special Requests   Final    NONE Performed at [facility], [address] [place], [place] [address]    Culture >=100,000 COLONIES/mL ESCHERICHIA COLI (A)  Final   Report Status 03/31/2021 FINAL  Final   Organism ID, Bacteria ESCHERICHIA COLI (A)  Final      Susceptibility   Escherichia coli - MIC*    AMPICILLIN <=2 SENSITIVE Sensitive     CEFAZOLIN <=4 SENSITIVE Sensitive     CEFEPIME <=0.12 SENSITIVE Sensitive     CEFTRIAXONE <=0.25 SENSITIVE Sensitive     CIPROFLOXACIN <=0.25 SENSITIVE Sensitive     GENTAMICIN <=1 SENSITIVE Sensitive     IMIPENEM <=0.25 SENSITIVE Sensitive     NITROFURANTOIN <=16 SENSITIVE Sensitive     TRIMETH/SULFA <=20 SENSITIVE Sensitive     AMPICILLIN/SULBACTAM <=2 SENSITIVE Sensitive     PIP/TAZO <=4 SENSITIVE Sensitive     * >=100,000 COLONIES/mL ESCHERICHIA COLI  Culture, blood (Routine X 2) w Reflex to ID Panel     Status: None (Preliminary result)   Collection Time: 03/28/21  6:27 PM   Specimen: BLOOD  Result Value Ref Range Status   Specimen Description   Final    BLOOD LEFT HAND Performed at [facility], [address] [place], [place] [address]    Special Requests   Final    BOTTLES DRAWN AEROBIC ONLY Blood  Culture adequate volume   Culture   Final    NO GROWTH 2 DAYS Performed at [facility], [address] [place], [place] [address]    Report Status PENDING  Incomplete     Radiology Studies: No results found. US RENAL  Final Result    CT Renal Stone Study  Final Result    DG Chest Port 1 View  Final Result      Scheduled Meds:  ALPRAZolam  0.5 mg Oral BID   calcium-vitamin D  1 tablet Oral BID   cephALEXin  500 mg Oral Q6H   feeding  supplement  237 mL Oral BID BM   hydrocortisone cream   Topical BID   levothyroxine  75 mcg Oral Q0600   loratadine  10 mg Oral Q0600   melatonin  10 mg Oral QHS   mirtazapine  15 mg Oral QHS   multivitamin with minerals  1 tablet Oral Q0600   senna  1 tablet Oral Q0600   traZODone  75 mg Oral QHS   venlafaxine XR  75 mg Oral Q0600   PRN Meds: acetaminophen **OR** acetaminophen, food thickener, liver oil-zinc oxide, ondansetron **OR** ondansetron (ZOFRAN) IV Continuous Infusions:     LOS: 3 days  Time spent: Greater than 50% of the 35 minute visit was spent in counseling/coordination of care for the patient as laid out in the A&P.   Arnetha Courser, MD [facility] 03/31/2021, 5:28 PM

## 2021-03-31 NOTE — Progress Notes (Signed)
Initial Nutrition Assessment  DOCUMENTATION CODES:   Non-severe (moderate) malnutrition in context of chronic illness  INTERVENTION:  - will order Ensure Enlive BID, each supplement provides 350 kcal and 20 grams of protein. - will order Magic Cup BID with meals, each supplement provides 290 kcal and 9 grams of protein. - will order 1 tablet multivitamin with minerals/day. - floor staff to assist with meals as needed.    NUTRITION DIAGNOSIS:   Moderate Malnutrition related to chronic illness (dementia) as evidenced by mild fat depletion, mild muscle depletion, moderate muscle depletion  GOAL:   Patient will meet greater than or equal to 90% of their needs  MONITOR:   PO intake, Supplement acceptance, Labs, Weight trends  REASON FOR ASSESSMENT:   Malnutrition Screening Tool  ASSESSMENT:   87 yo female with medical history of dementia, HLD, GERD, and hypothyroidism. She presented to ED with AMS. Patient is noted to be severely HOH with cognitive impairment. She was recently in the ED following a fall from her wheelchair. UA in the ED consistent with UTI. She was started on IV fluids d/t possible dehydration.  Patient laying in bed with no family or visitors present. Patient unable to provide any nutrition-related information.  Tech was at bedside and had assisted patient with breakfast and lunch today. She reports that patient ate 50% of breakfast with 2-3 coughs during the meal and 40% of lunch. She does well with nectar-thick liquids and may have eaten more at lunch but the meat that she had was a bit too dry for her to tolerate well.  Weight on 6/25 was 138 lb and weight on 6/6 was 146 lb. This indicates 8 lb weight loss (5.5% body weight) in the past 3 weeks; significant for time frame.    Labs reviewed. Medications reviewed; 1 tablet oscal-D BID, 75 mcg oral synthroid/day, 10 mg melatonin/day, 1 tablet senokot/day.     NUTRITION - FOCUSED PHYSICAL EXAM:  Flowsheet Row  Most Recent Value  Orbital Region Mild depletion  Upper Arm Region No depletion  Thoracic and Lumbar Region Unable to assess  Buccal Region Mild depletion  Temple Region Moderate depletion  Clavicle Bone Region Moderate depletion  Clavicle and Acromion Bone Region Mild depletion  Scapular Bone Region Unable to assess  Dorsal Hand Moderate depletion  Patellar Region No depletion  Anterior Thigh Region No depletion  Posterior Calf Region No depletion  Edema (RD Assessment) Mild  [all extremities]  Hair Reviewed  Eyes Reviewed  Mouth Unable to assess  Skin Reviewed  Nails Reviewed       Diet Order:   Diet Order             DIET DYS 2 Room service appropriate? No; Fluid consistency: Nectar Thick  Diet effective now                   EDUCATION NEEDS:   No education needs have been identified at this time  Skin:  Skin Assessment: Skin Integrity Issues: Skin Integrity Issues:: Stage I, Other (Comment) Stage I: L heel Other: MASD to perineum  Last BM:  6/28 (type 6 x1)  Height:   Ht Readings from Last 1 Encounters:  03/28/21 5\' 1"  (1.549 m)    Weight:   Wt Readings from Last 1 Encounters:  03/28/21 62.5 kg      Estimated Nutritional Needs:  Kcal:  1565-1750 kcal Protein:  75-90 grams Fluid:  >/= 1.6 L/day     03/30/21, MS, RD,  LDN, CNSC Inpatient Clinical Dietitian RD pager # available in [place]  After hours/weekend pager # available in [facility]

## 2021-04-01 LAB — RESP PANEL BY RT-PCR (FLU A&B, COVID) ARPGX2
Influenza A by PCR: NEGATIVE
Influenza B by PCR: NEGATIVE
SARS Coronavirus 2 by RT PCR: NEGATIVE

## 2021-04-01 NOTE — TOC Progression Note (Signed)
Transition of Care [facility]) - Progression Note    Patient Details  Name: Gina Hodges MRN: [id number] Date of Birth: 1927/09/01  [facility]) CM/SW Contact  Geni Bers, RN Phone Number: 04/01/2021, 4:34 PM  Clinical Narrative:    A call again to [facility] was made again to speak with Admission Coordinator. Coordinator was still in a meeting.    Expected Discharge Plan: Assisted Living Barriers to Discharge: No Barriers Identified  Expected Discharge Plan and Services Expected Discharge Plan: Assisted Living       Living arrangements for the past 2 months: Assisted Living Facility                                       Social Determinants of Health (SDOH) Interventions    Readmission Risk Interventions Readmission Risk Prevention Plan 11/12/2019  Post Dischage Appt Not Complete  Appt Comments facility resident  Medication Screening Complete  Transportation Screening Complete  Some recent data might be hidden

## 2021-04-01 NOTE — Evaluation (Addendum)
Physical Therapy Evaluation Patient Details Name: Gina Hodges MRN: [id number] DOB: [birth year] Today's Date: 04/01/2021   History of Present Illness  Ms. Giovanni is a 87 yo female with PMH dementia, HLD, GERD, hypothyroidism, Afib, CVA  who presented with altered mental status 03/28/21.  Patient was  seen in the ED 03/27/21 for a fall from a wheelchair. CTof head  was negative. W/u revealed a UTI. She was started on abx and sent back to her facility.  Clinical Impression  ADDENDUM for EVALUATION ON 04/01/21:. Patient currently requires increased level of care in a SNF level facility. The patient is pleasant, oriented to [facility], that she fell from her WC recently.  Patient stated" I can't walk." Patient requires max assistance for mobility, sitting on bed edge with significant forward flexed posture with risk to topple over forward if not  watched closely, especially if sitting in  a WC. Marland Kitchen Attempted  to stand  in prep for transfer But  Patient unable  to power up to stand with attempts using bed  pad at pelvis to lift.   Patient was seen 3 weeks ago  by PT, patient is  dependent  on staff at ALF.  PT will follow while in acute care to decrease  burden of care at Dc but skilled PT at DC not indicated.  Follow Up Recommendations RECOMMEND HIGHER LEVEL OF CARE THAN ALF   Equipment Recommendations  None recommended by PT    Recommendations for Other Services       Precautions / Restrictions Precautions Precautions: Fall      Mobility  Bed Mobility   Bed Mobility: Supine to Sit;Sit to Supine     Supine to sit: Max assist Sit to supine: Total assist   General bed mobility comments: Assist with legs and trunk to  and use of bed pad to turn and slid  to bed edge and to raise trunk, craddled in bed pad.    Transfers                 General transfer comment: attempted to stnad from bed with bed opad  supported at pelvis. did not clear bed surface., Did not  pivot to  recliner.  Ambulation/Gait             General Gait Details: "I don't walk" Per  pt.  Stairs            Wheelchair Mobility    Modified Rankin (Stroke Patients Only)       Balance   Sitting-balance support: Single extremity supported;Bilateral upper extremity supported;Feet supported Sitting balance-Leahy Scale: Poor Sitting balance - Comments: reliant on UE support, at times leans posteriorly, generally supports self when sitting once mid line achieved.                                     Pertinent Vitals/Pain Pain Assessment: Faces Faces Pain Scale: Hurts little more Pain Location: back Pain Intervention(s): Monitored during session    Home Living Family/patient expects to be discharged to:: Assisted living   Available Help at Discharge: Available 24 hours/day             Additional Comments: Patient continues to reside at [facility], sounds like hoyer to wheelchair, and assist for ADL care.  States she can feed herself.    Prior Function Level of Independence: Needs assistance   Gait / Transfers Assistance Needed: pt requires  assistance for all frunctional mobility, dependent for wheelchair mobility  ADL's / Homemaking Assistance Needed: assist for all ADL except self feeding  Comments: aspiration pneumonia, on nectar thick but no thickener in room. Drinking Ensure.     Hand Dominance   Dominant Hand: Right    Extremity/Trunk Assessment   Upper Extremity Assessment Upper Extremity Assessment: Generalized weakness    Lower Extremity Assessment Lower Extremity Assessment: Generalized weakness    Cervical / Trunk Assessment Cervical / Trunk Assessment: Kyphotic  Communication   Communication: HOH  Cognition Arousal/Alertness: Awake/alert Behavior During Therapy: WFL for tasks assessed/performed Overall Cognitive Status: History of cognitive impairments - at baseline                                 General  Comments: oriented to WL,  not date. Follows  simple directions, asking  to have hair combed. Stated that she fell out of WC recently  and had to have her hair cut.      General Comments      Exercises     Assessment/Plan    PT Assessment Patient needs continued PT services  PT Problem List Decreased strength;Decreased balance;Decreased mobility;Decreased knowledge of use of DME;Decreased safety awareness;Decreased knowledge of precautions;Cardiopulmonary status limiting activity       PT Treatment Interventions Functional mobility training;Therapeutic activities;Therapeutic exercise;Balance training;Patient/family education    PT Goals (Current goals can be found in the Care Plan section)  Acute Rehab PT Goals Patient Stated Goal: pt unable to state, PT goal to reduce caregiver burden PT Goal Formulation: Patient unable to participate in goal setting Time For Goal Achievement: 04/15/21 Potential to Achieve Goals: Fair    Frequency Min 2X/week   Barriers to discharge        Co-evaluation               AM-PAC PT "6 Clicks" Mobility  Outcome Measure Help needed turning from your back to your side while in a flat bed without using bedrails?: Total Help needed moving from lying on your back to sitting on the side of a flat bed without using bedrails?: Total Help needed moving to and from a bed to a chair (including a wheelchair)?: Total Help needed standing up from a chair using your arms (e.g., wheelchair or bedside chair)?: Total Help needed to walk in hospital room?: Total Help needed climbing 3-5 steps with a railing? : Total 6 Click Score: 6    End of Session Equipment Utilized During Treatment: Oxygen Activity Tolerance: Patient tolerated treatment well Patient left: in bed;with call bell/phone within reach;with bed alarm set Nurse Communication: Mobility status;Need for lift equipment PT Visit Diagnosis: Unsteadiness on feet (R26.81);Other abnormalities of  gait and mobility (R26.89);Muscle weakness (generalized) (M62.81)    Time: 3845-3646 PT Time Calculation (min) (ACUTE ONLY): 25 min   Charges:   PT Evaluation $PT Eval Low Complexity: 1 Low PT Treatments $Therapeutic Activity: 8-22 mins        Blanchard Kelch PT Acute Rehabilitation Services Pager [phone] Office [phone]   Rada Hay 04/01/2021, 1:34 PM

## 2021-04-01 NOTE — TOC Progression Note (Signed)
Transition of Care [facility]) - Progression Note    Patient Details  Name: Gina [facility] MRN: [id number] Date of Birth: June 28, 1927  [facility]) CM/SW Contact  Geni Bers, RN Phone Number: 04/01/2021, 2:45 PM  Clinical Narrative:     A call was made to [facility] to speak with admission coordinator. Coordinator was in a meeting and unable to answer this call. Left VM for her to return call.   Expected Discharge Plan: Assisted Living Barriers to Discharge: No Barriers Identified  Expected Discharge Plan and Services Expected Discharge Plan: Assisted Living       Living arrangements for the past 2 months: Assisted Living Facility                                       Social Determinants of Health (SDOH) Interventions    Readmission Risk Interventions Readmission Risk Prevention Plan 11/12/2019  Post Dischage Appt Not Complete  Appt Comments facility resident  Medication Screening Complete  Transportation Screening Complete  Some recent data might be hidden

## 2021-04-01 NOTE — Progress Notes (Signed)
Progress Note    Gina Hodges   YNW:[id number]  DOB: 04/08/1927  DOA: 03/28/2021     4  [place]  CC: confusion  Hospital Course: Ms. Gina Hodges is a 87 yo female with PMH dementia, HLD, GERD, hypothyroidism who presented with altered mental status.  Due to severe hard of hearing and cognitive impairment, she is a difficult historian. She had recently been seen in the ER for a fall from her wheelchair.  CT head was negative at that time and she was started on antibiotics for a presumed UTI and discharged back to her nursing home.  Due to ongoing concern for altered mentation, she was sent back to the ER for further evaluation.  UA was still consistent with infection and she was started on Rocephin. She was also started on fluids for possible dehydration.  Medically stable for discharge but TOC is unable to reach to her facility coordinator.  Interval History:  Patient was seen and examined today.  No new complaint.  She wants to get out of bed.  Assessment & Plan: UTI.  Urine culture with pansensitive E. coli. -Switched to cefdinir with Keflex to complete a 5-day course.  AKI.  Most likely secondary to poor p.o. intake. Resolved with IV fluid.  Prediabetes.  A1c of 5.9. -Continue with diet control  History of hypokalemia.  Patient was on potassium supplement which was held due to upper normal limit of potassium. -Continue to monitor  Dementia with behavioral problem [facility] - [facility](HCC) - Patient has underlying chronic cognitive impairment from this and may have some acute worsening of her confusion and mood in setting of infection - CT head after fall was negative.  Do not feel strongly about repeat CT at this time; no focal deficits on my exam - she appears to be back to her baseline; follows commands well, just very hard of hearing   Thrombocytopenia (HCC)-resolved as of 03/29/2021 - Improved from prior value, still question validity of that sample    Hyponatremia-resolved as of 03/30/2021 - From presumed hypovolemia - resolved with IVF  Antimicrobials: Rocephin 03/28/2021 >> 03/31/2021 Keflex 6/28>>  DVT prophylaxis: SCDs Start: 03/28/21 1619   Code Status:   Code Status: DNR Family Communication:   Disposition Plan: Status is: Inpatient  Remains inpatient appropriate because:Altered mental status, IV treatments appropriate due to intensity of illness or inability to take PO, and Inpatient level of care appropriate due to severity of illness  Dispo: The patient is from: SNF              Anticipated d/c is to: SNF              Patient currently is medically stable.  TOC is waiting response from her facility.   Difficult to place patient No  Risk of unplanned readmission score: Unplanned Admission- Pilot do not use: 16.98   Objective: Blood pressure 116/71, pulse 84, temperature 97.9 F (36.6 C), temperature source Oral, resp. rate 14, height 5\' 1"  (1.549 m), weight 62.5 kg, SpO2 100 %.  Examination: General.  Frail, hard of hearing elderly lady, in no acute distress. Pulmonary.  Lungs clear bilaterally, normal respiratory effort. CV.  Regular rate and rhythm, no JVD, rub or murmur. Abdomen.  Soft, nontender, nondistended, BS positive. CNS.  Alert and oriented to self.  No focal neurologic deficit. Extremities.  No edema, no cyanosis, pulses intact and symmetrical. Psychiatry.  Judgment and insight appears impaired.  Consultants:    Procedures:    Data Reviewed:  I have personally reviewed following labs and imaging studies Results for orders placed or performed during the hospital encounter of 03/28/21 (from the past 24 hour(s))  Resp Panel by RT-PCR (Flu A&B, Covid) Nasopharyngeal Swab     Status: None   Collection Time: 04/01/21 10:27 AM   Specimen: Nasopharyngeal Swab; Nasopharyngeal(NP) swabs in vial transport medium  Result Value Ref Range   SARS Coronavirus 2 by RT PCR NEGATIVE NEGATIVE   Influenza A by PCR  NEGATIVE NEGATIVE   Influenza B by PCR NEGATIVE NEGATIVE     Recent Results (from the past 240 hour(s))  Urine culture     Status: Abnormal   Collection Time: 03/27/21  2:20 PM   Specimen: Urine, Clean Catch  Result Value Ref Range Status   Specimen Description   Final    URINE, CLEAN CATCH Performed at [facility], [address], [place], [place] [address]    Special Requests   Final    NONE Performed at [facility], [address], [place], [place] [address]    Culture >=100,000 COLONIES/mL ESCHERICHIA COLI (A)  Final   Report Status 03/30/2021 FINAL  Final   Organism ID, Bacteria ESCHERICHIA COLI (A)  Final      Susceptibility   Escherichia coli - MIC*    AMPICILLIN <=2 SENSITIVE Sensitive     CEFAZOLIN <=4 SENSITIVE Sensitive     CEFEPIME <=0.12 SENSITIVE Sensitive     CEFTRIAXONE <=0.25 SENSITIVE Sensitive     CIPROFLOXACIN <=0.25 SENSITIVE Sensitive     GENTAMICIN <=1 SENSITIVE Sensitive     IMIPENEM <=0.25 SENSITIVE Sensitive     NITROFURANTOIN <=16 SENSITIVE Sensitive     TRIMETH/SULFA <=20 SENSITIVE Sensitive     AMPICILLIN/SULBACTAM <=2 SENSITIVE Sensitive     PIP/TAZO <=4 SENSITIVE Sensitive     * >=100,000 COLONIES/mL ESCHERICHIA COLI  Resp Panel by RT-PCR (Flu A&B, Covid) Nasopharyngeal Swab     Status: None   Collection Time: 03/28/21 10:48 AM   Specimen: Nasopharyngeal Swab; Nasopharyngeal(NP) swabs in vial transport medium  Result Value Ref Range Status   SARS Coronavirus 2 by RT PCR NEGATIVE NEGATIVE Final    Comment: (NOTE) SARS-CoV-2 target nucleic acids are NOT DETECTED.  The SARS-CoV-2 RNA is generally detectable in upper respiratory specimens during the acute phase of infection. The lowest concentration of SARS-CoV-2 viral copies this assay can detect is 138 copies/mL. A negative result does not preclude SARS-Cov-2 infection and should not be used as the sole basis for treatment or other patient management  decisions. A negative result may occur with  improper specimen collection/handling, submission of specimen other than nasopharyngeal swab, presence of viral mutation(s) within the areas targeted by this assay, and inadequate number of viral copies(<138 copies/mL). A negative result must be combined with clinical observations, patient history, and epidemiological information. The expected result is Negative.  Fact Sheet for Patients:  BloggerCourse.com  Fact Sheet for Healthcare Providers:  SeriousBroker.it  This test is no t yet approved or cleared by the [place] FDA and  has been authorized for detection and/or diagnosis of SARS-CoV-2 by FDA under an Emergency Use Authorization (EUA). This EUA will remain  in effect (meaning this test can be used) for the duration of the COVID-19 declaration under Section 564(b)(1) of the Act, 21 U.S.C.section 360bbb-3(b)(1), unless the authorization is terminated  or revoked sooner.       Influenza A by PCR NEGATIVE NEGATIVE Final   Influenza B by PCR NEGATIVE NEGATIVE Final  Comment: (NOTE) The Xpert Xpress SARS-CoV-2/FLU/RSV plus assay is intended as an aid in the diagnosis of influenza from Nasopharyngeal swab specimens and should not be used as a sole basis for treatment. Nasal washings and aspirates are unacceptable for Xpert Xpress SARS-CoV-2/FLU/RSV testing.  Fact Sheet for Patients: BloggerCourse.com  Fact Sheet for Healthcare Providers: SeriousBroker.it  This test is not yet approved or cleared by the [place] FDA and has been authorized for detection and/or diagnosis of SARS-CoV-2 by FDA under an Emergency Use Authorization (EUA). This EUA will remain in effect (meaning this test can be used) for the duration of the COVID-19 declaration under Section 564(b)(1) of the Act, 21 U.S.C. section 360bbb-3(b)(1), unless the  authorization is terminated or revoked.  Performed at [facility], [address] [place], [place] [address]   Culture, blood (Routine X 2) w Reflex to ID Panel     Status: None (Preliminary result)   Collection Time: 03/28/21 11:20 AM   Specimen: BLOOD  Result Value Ref Range Status   Specimen Description   Final    BLOOD LEFT ARM Performed at [facility], [address] [place], [place] [address]    Special Requests   Final    BOTTLES DRAWN AEROBIC AND ANAEROBIC Blood Culture results may not be optimal due to an inadequate volume of blood received in culture bottles Performed at [facility], [address], [place], [place] [address]    Culture   Final    NO GROWTH 4 DAYS Performed at [facility], [address], [place], [place] [address]    Report Status PENDING  Incomplete  Culture, Urine     Status: Abnormal   Collection Time: 03/28/21 12:00 PM   Specimen: Urine, Random  Result Value Ref Range Status   Specimen Description   Final    URINE, RANDOM Performed at [facility], [address], [place], [place] [address]    Special Requests   Final    NONE Performed at [facility], [address], [place], [place] [address]    Culture >=100,000 COLONIES/mL ESCHERICHIA COLI (A)  Final   Report Status 03/31/2021 FINAL  Final   Organism ID, Bacteria ESCHERICHIA COLI (A)  Final      Susceptibility   Escherichia coli - MIC*    AMPICILLIN <=2 SENSITIVE Sensitive     CEFAZOLIN <=4 SENSITIVE Sensitive     CEFEPIME <=0.12 SENSITIVE Sensitive     CEFTRIAXONE <=0.25 SENSITIVE Sensitive     CIPROFLOXACIN <=0.25 SENSITIVE Sensitive     GENTAMICIN <=1 SENSITIVE Sensitive     IMIPENEM <=0.25 SENSITIVE Sensitive     NITROFURANTOIN <=16 SENSITIVE Sensitive     TRIMETH/SULFA <=20 SENSITIVE Sensitive     AMPICILLIN/SULBACTAM <=2 SENSITIVE Sensitive     PIP/TAZO <=4 SENSITIVE  Sensitive     * >=100,000 COLONIES/mL ESCHERICHIA COLI  Culture, blood (Routine X 2) w Reflex to ID Panel     Status: None (Preliminary result)   Collection Time: 03/28/21  6:27 PM   Specimen: BLOOD  Result Value Ref Range Status   Specimen Description   Final    BLOOD LEFT HAND Performed at [facility], [address], [place], [place] [address]    Special Requests   Final    BOTTLES DRAWN AEROBIC ONLY Blood Culture adequate volume   Culture   Final    NO GROWTH 3 DAYS Performed at [facility], [address] [place], [place] [address]    Report Status PENDING  Incomplete  Resp Panel by RT-PCR (Flu A&B, Covid) Nasopharyngeal Swab     Status: None   Collection Time: 04/01/21 10:27 AM   Specimen: Nasopharyngeal Swab; Nasopharyngeal(NP) swabs in vial transport medium  Result Value Ref Range Status   SARS Coronavirus 2 by RT PCR NEGATIVE NEGATIVE Final    Comment: (NOTE) SARS-CoV-2 target nucleic acids are NOT DETECTED.  The SARS-CoV-2 RNA is generally detectable in upper respiratory specimens during the acute phase of infection. The lowest concentration of SARS-CoV-2 viral copies this assay can detect is 138 copies/mL. A negative result does not preclude SARS-Cov-2 infection and should not be used as the sole basis for treatment or other patient management decisions. A negative result may occur with  improper specimen collection/handling, submission of specimen other than nasopharyngeal swab, presence of viral mutation(s) within the areas targeted by this assay, and inadequate number of viral copies(<138 copies/mL). A negative result must be combined with clinical observations, patient history, and epidemiological information. The expected result is Negative.  Fact Sheet for Patients:  BloggerCourse.com  Fact Sheet for Healthcare Providers:  SeriousBroker.it  This test is no t yet approved or  cleared by the [place] FDA and  has been authorized for detection and/or diagnosis of SARS-CoV-2 by FDA under an Emergency Use Authorization (EUA). This EUA will remain  in effect (meaning this test can be used) for the duration of the COVID-19 declaration under Section 564(b)(1) of the Act, 21 U.S.C.section 360bbb-3(b)(1), unless the authorization is terminated  or revoked sooner.       Influenza A by PCR NEGATIVE NEGATIVE Final   Influenza B by PCR NEGATIVE NEGATIVE Final    Comment: (NOTE) The Xpert Xpress SARS-CoV-2/FLU/RSV plus assay is intended as an aid in the diagnosis of influenza from Nasopharyngeal swab specimens and should not be used as a sole basis for treatment. Nasal washings and aspirates are unacceptable for Xpert Xpress SARS-CoV-2/FLU/RSV testing.  Fact Sheet for Patients: BloggerCourse.com  Fact Sheet for Healthcare Providers: SeriousBroker.it  This test is not yet approved or cleared by the [place] FDA and has been authorized for detection and/or diagnosis of SARS-CoV-2 by FDA under an Emergency Use Authorization (EUA). This EUA will remain in effect (meaning this test can be used) for the duration of the COVID-19 declaration under Section 564(b)(1) of the Act, 21 U.S.C. section 360bbb-3(b)(1), unless the authorization is terminated or revoked.  Performed at [facility], [address] [place], [place] [address]      Radiology Studies: No results found. US RENAL  Final Result    CT Renal Stone Study  Final Result    DG Chest Port 1 View  Final Result      Scheduled Meds:  ALPRAZolam  0.5 mg Oral BID   calcium-vitamin D  1 tablet Oral BID   cephALEXin  500 mg Oral Q6H   feeding supplement  237 mL Oral BID BM   hydrocortisone cream   Topical BID   levothyroxine  75 mcg Oral Q0600   loratadine  10 mg Oral Q0600   melatonin  10 mg Oral QHS   mirtazapine  15 mg  Oral QHS   multivitamin with minerals  1 tablet Oral Q0600   senna  1 tablet Oral Q0600   traZODone  75 mg Oral QHS   venlafaxine XR  75 mg Oral Q0600   PRN Meds: acetaminophen **OR** acetaminophen,  food thickener, liver oil-zinc oxide, ondansetron **OR** ondansetron (ZOFRAN) IV Continuous Infusions:     LOS: 4 days  Time spent: Greater than 50% of the 30 minute visit was spent in counseling/coordination of care for the patient as laid out in the A&P.   Arnetha Courser, MD [facility] 04/01/2021, 4:44 PM

## 2021-04-01 NOTE — TOC Progression Note (Signed)
Transition of Care [facility]) - Progression Note    Patient Details  Name: Gina Hodges MRN: [id number] Date of Birth: 07/10/1927  [facility]) CM/SW Contact  Geni Bers, RN Phone Number: 04/01/2021, 12:03 PM  Clinical Narrative:    Pt is from ALF and will return. However, there were no PT orders. ALF asked for PT to see pt before taking her back. ALF feel that pt will need SNF. Orders placed and COVID test was ordered. Will continue to follow.   Expected Discharge Plan: Assisted Living Barriers to Discharge: No Barriers Identified  Expected Discharge Plan and Services Expected Discharge Plan: Assisted Living       Living arrangements for the past 2 months: Assisted Living Facility                                       Social Determinants of Health (SDOH) Interventions    Readmission Risk Interventions Readmission Risk Prevention Plan 11/12/2019  Post Dischage Appt Not Complete  Appt Comments facility resident  Medication Screening Complete  Transportation Screening Complete  Some recent data might be hidden

## 2021-04-02 LAB — CULTURE, BLOOD (ROUTINE X 2): Culture: NO GROWTH

## 2021-04-02 NOTE — Evaluation (Signed)
Occupational Therapy Evaluation Patient Details Name: Gina Hodges MRN: [id number] DOB: Jan 30, 1927 Today's Date: 04/02/2021    History of Present Illness Gina Hodges is a 87 yo female with PMH dementia, HLD, GERD, hypothyroidism, Afib, CVA  who presented with altered mental status 03/28/21.  Patient was  seen in the ED 03/27/21 for a fall from a wheelchair. CTof head  was negative. W/u revealed a UTI. She was started on abx and sent back to her facility.   Clinical Impression   Gina Hodges is a 87 year old woman who has had multiple ED visits this month and now admitted to hospital with AKI and has had a slow decline in her baseline abilities. At baseline she lives at an ALF, required one person assist for standing and transfers to wheelchair and able to perform self feeding and assist with UB ADLs. Today patient max assist to transfer to edge of bed, exhibits poor sitting balance requiring external assist and total assist to return to bed. Patient able to feed self with setup but only a couple of bites before fatiguing and requiring nursing to feed her to maintain optimal nutrition. Patient able to wash face but not manage hair today. Patient requiring mod-total assist for ADLs at bed level. Patient hear of hearing which effects communication but is able to answer questions appropriately. She knows she is weak and is needing more assistance. Patient will benefit from skilled OT services while in hospital to improve deficits and learn compensatory strategies as needed in order to improve functional abilities and reduce caregiver burden.      Follow Up Recommendations  SNF    Equipment Recommendations  None recommended by OT    Recommendations for Other Services       Precautions / Restrictions Precautions Precautions: Fall Restrictions Weight Bearing Restrictions: No      Mobility Bed Mobility Overal bed mobility: Needs Assistance Bed Mobility: Sit to Supine;Supine to Sit      Supine to sit: Max assist;HOB elevated Sit to supine: Total assist   General bed mobility comments: Patient max assist to transfer to side of bed needing assistance for LEs, trunk and pivoting to edge. Patient attempting to assist with UEs on bed rail and pulling up using therapisth and to pull up on but two weak to move much. Total assist to return to supine and pulling up in bed.    Transfers Overall transfer level: Needs assistance               General transfer comment: unable to attempt stand with one person - patient unable to maintain sitting balance.    Balance Overall balance assessment: Needs assistance Sitting-balance support: Feet supported Sitting balance-Leahy Scale: Poor Sitting balance - Comments: needed min-mod assist external assist to sit edge of bed. Postural control: Left lateral lean                                 ADL either performed or assessed with clinical judgement   ADL Overall ADL's : Needs assistance/impaired Eating/Feeding: Moderate assistance;Bed level Eating/Feeding Details (indicate cue type and reason): able to feed self with setup a couple of bites but fatigues quickly - in order for proper nutrition nursing staff has been feeding patient. Grooming: Set up;Wash/dry face;Bed level   Upper Body Bathing: Moderate assistance;Bed level   Lower Body Bathing: Bed level;Total assistance   Upper Body Dressing : Bed level;Maximal assistance  Lower Body Dressing: Total assistance;Bed level       Toileting- Clothing Manipulation and Hygiene: Total assistance;+2 for physical assistance;+2 for safety/equipment;Bed level               Vision Patient Visual Report: No change from baseline       Perception     Praxis      Pertinent Vitals/Pain Pain Assessment: No/denies pain     Hand Dominance Right   Extremity/Trunk Assessment Upper Extremity Assessment Upper Extremity Assessment: Generalized weakness   Lower  Extremity Assessment Lower Extremity Assessment: Generalized weakness   Cervical / Trunk Assessment Cervical / Trunk Assessment: Kyphotic   Communication Communication Communication: HOH   Cognition Arousal/Alertness: Awake/alert Behavior During Therapy: WFL for tasks assessed/performed Overall Cognitive Status: History of cognitive impairments - at baseline                                 General Comments: Able to answer questions appropriately - knows she is weaker than normal and needing more assistance.   General Comments       Exercises     Shoulder Instructions      Home Living Family/patient expects to be discharged to:: Skilled nursing facility                                        Prior Functioning/Environment Level of Independence: Needs assistance  Gait / Transfers Assistance Needed: pt requires assistance for bed mobility and transfers, transfers to wc ADL's / Homemaking Assistance Needed: assist for all ADL except self feeding Communication / Swallowing Assistance Needed: very hard of hearing Comments: aspiration pneumonia, on nectar thick but no thickener in room. Drinking Ensure.        OT Problem List: Decreased strength;Decreased activity tolerance;Impaired balance (sitting and/or standing);Decreased knowledge of use of DME or AE      OT Treatment/Interventions: Self-care/ADL training;Therapeutic exercise;DME and/or AE instruction;Therapeutic activities;Balance training;Patient/family education    OT Goals(Current goals can be found in the care plan section) Acute Rehab OT Goals Patient Stated Goal: to get stronger OT Goal Formulation: With patient Time For Goal Achievement: 04/16/21 Potential to Achieve Goals: Fair  OT Frequency: Min 2X/week   Barriers to D/C:            Co-evaluation              AM-PAC OT "6 Clicks" Daily Activity     Outcome Measure Help from another person eating meals?: A Lot Help  from another person taking care of personal grooming?: A Lot Help from another person toileting, which includes using toliet, bedpan, or urinal?: Total Help from another person bathing (including washing, rinsing, drying)?: A Lot Help from another person to put on and taking off regular upper body clothing?: A Lot Help from another person to put on and taking off regular lower body clothing?: Total 6 Click Score: 10   End of Session Equipment Utilized During Treatment: Oxygen Nurse Communication: Mobility status (o2 sats)  Activity Tolerance: Patient limited by fatigue Patient left: in bed;with call bell/phone within reach;with bed alarm set  OT Visit Diagnosis: History of falling (Z91.81);Muscle weakness (generalized) (M62.81)                Time: 0086-7619 OT Time Calculation (min): 24 min Charges:  OT General Charges $OT Visit:  1 Visit OT Evaluation $OT Eval Moderate Complexity: 1 Mod  Mayte Diers, OTR/L Acute Care Rehab Services  Office [phone] Pager: [phone]   Kelli Churn 04/02/2021, 3:24 PM

## 2021-04-02 NOTE — Plan of Care (Signed)
  Problem: Education: Goal: Knowledge of General Education information will improve Description Including pain rating scale, medication(s)/side effects and non-pharmacologic comfort measures Outcome: Progressing   

## 2021-04-02 NOTE — TOC Progression Note (Signed)
Transition of Care [facility]) - Progression Note    Patient Details  Name: Gina Hodges MRN: [id number] Date of Birth: 1927/02/21  Transition of Care [facility]) CM/SW Contact  Geni Bers, RN Phone Number: 04/02/2021, 12:44 PM  Clinical Narrative:    ALF decline to take pt back related to her not being able to stand. When she was at ALF she was able to stand. ALF is asking for pt to go to skill. Will need PT to come back to Eval pt. PT office was called for re-eval.    Expected Discharge Plan: Assisted Living Barriers to Discharge: No Barriers Identified  Expected Discharge Plan and Services Expected Discharge Plan: Assisted Living       Living arrangements for the past 2 months: Assisted Living Facility                                       Social Determinants of Health (SDOH) Interventions    Readmission Risk Interventions Readmission Risk Prevention Plan 11/12/2019  Post Dischage Appt Not Complete  Appt Comments facility resident  Medication Screening Complete  Transportation Screening Complete  Some recent data might be hidden

## 2021-04-02 NOTE — Progress Notes (Signed)
Progress Note    Gina Hodges   YCX:[id number]  DOB: [birth year]  DOA: 03/28/2021     5  PCP: Lottie Dawson  CC: confusion  Hospital Course: [name] is a 86 yo female with PMH dementia, HLD, GERD, hypothyroidism who presented with altered mental status.  Due to severe hard of hearing and cognitive impairment, she is a difficult historian. She had recently been seen in the ER for a fall from her wheelchair.  CT head was negative at that time and she was started on antibiotics for a presumed UTI and discharged back to her nursing home.  Due to ongoing concern for altered mentation, she was sent back to the ER for further evaluation.  UA was still consistent with infection and she was started on Rocephin. She was also started on fluids for possible dehydration.  Medically stable for discharge, her ALF refused to take her back as patient is weak-TOC is working on SNF placement now.  Interval History:  Patient seems little lethargic and weak but comfortable.  She was resting in her bed and no new complaints.  Assessment & Plan: UTI.  Urine culture with pansensitive E. coli. -Switched ceftriaxone with Keflex to complete a 5-day course.  AKI.  Most likely secondary to poor p.o. intake. Resolved with IV fluid.  Prediabetes.  A1c of 5.9. -Continue with diet control  History of hypokalemia.  Patient was on potassium supplement which was held due to upper normal limit of potassium. -Continue to monitor  Dementia with behavioral problem [facility]) - Patient has underlying chronic cognitive impairment from this and may have some acute worsening of her confusion and mood in setting of infection - CT head after fall was negative.  Do not feel strongly about repeat CT at this time; no focal deficits on my exam - she appears to be back to her baseline; follows commands well, just very hard of hearing   Thrombocytopenia (HCC)-resolved as of 03/29/2021 - Improved from prior  value, still question validity of that sample   Hyponatremia-resolved as of 03/30/2021 - From presumed hypovolemia - resolved with IVF  Generalized weakness.  Her ALF refused to take her back as patient is more weaker and requiring more assistance.  Asked PT and OT to reevaluate her for SNF. -TOC is working on placement  Antimicrobials: Rocephin 03/28/2021 >> 03/31/2021 Keflex 6/28>>  DVT prophylaxis: SCDs Start: 03/28/21 1619   Code Status:   Code Status: DNR Family Communication:   Disposition Plan: Status is: Inpatient  Remains inpatient appropriate because:Altered mental status, IV treatments appropriate due to intensity of illness or inability to take PO, and Inpatient level of care appropriate due to severity of illness  Dispo: The patient is from: SNF              Anticipated d/c is to: SNF              Patient currently is medically stable.  TOC is waiting response from her facility.   Difficult to place patient No  Risk of unplanned readmission score: Unplanned Admission- Pilot do not use: 15.7   Objective: Blood pressure (!) 101/50, pulse 92, temperature 97.6 F (36.4 C), resp. rate 16, height 5\' 1"  (1.549 m), weight 62.5 kg, SpO2 100 %.  Examination: General.  Frail and lethargic elderly lady, in no acute distress.  Very hard of hearing Pulmonary.  Lungs clear bilaterally, normal respiratory effort. CV.  Regular rate and rhythm, no JVD, rub or murmur. Abdomen.  Soft,  nontender, nondistended, BS positive. CNS.  Alert and oriented to self.  No focal neurologic deficit. Extremities.  No edema, no cyanosis, pulses intact and symmetrical. Psychiatry.  Judgment and insight appears impaired.  Consultants:    Procedures:    Data Reviewed: I have personally reviewed following labs and imaging studies No results found for this or any previous visit (from the past 24 hour(s)).    Recent Results (from the past 240 hour(s))  Urine culture     Status: Abnormal    Collection Time: 03/27/21  2:20 PM   Specimen: Urine, Clean Catch  Result Value Ref Range Status   Specimen Description   Final    URINE, CLEAN CATCH Performed at [facility], [address], [place], [place] [address]    Special Requests   Final    NONE Performed at [facility], [address] [place], [place] [address]    Culture >=100,000 COLONIES/mL ESCHERICHIA COLI (A)  Final   Report Status 03/30/2021 FINAL  Final   Organism ID, Bacteria ESCHERICHIA COLI (A)  Final      Susceptibility   Escherichia coli - MIC*    AMPICILLIN <=2 SENSITIVE Sensitive     CEFAZOLIN <=4 SENSITIVE Sensitive     CEFEPIME <=0.12 SENSITIVE Sensitive     CEFTRIAXONE <=0.25 SENSITIVE Sensitive     CIPROFLOXACIN <=0.25 SENSITIVE Sensitive     GENTAMICIN <=1 SENSITIVE Sensitive     IMIPENEM <=0.25 SENSITIVE Sensitive     NITROFURANTOIN <=16 SENSITIVE Sensitive     TRIMETH/SULFA <=20 SENSITIVE Sensitive     AMPICILLIN/SULBACTAM <=2 SENSITIVE Sensitive     PIP/TAZO <=4 SENSITIVE Sensitive     * >=100,000 COLONIES/mL ESCHERICHIA COLI  Resp Panel by RT-PCR (Flu A&B, Covid) Nasopharyngeal Swab     Status: None   Collection Time: 03/28/21 10:48 AM   Specimen: Nasopharyngeal Swab; Nasopharyngeal(NP) swabs in vial transport medium  Result Value Ref Range Status   SARS Coronavirus 2 by RT PCR NEGATIVE NEGATIVE Final    Comment: (NOTE) SARS-CoV-2 target nucleic acids are NOT DETECTED.  The SARS-CoV-2 RNA is generally detectable in upper respiratory specimens during the acute phase of infection. The lowest concentration of SARS-CoV-2 viral copies this assay can detect is 138 copies/mL. A negative result does not preclude SARS-Cov-2 infection and should not be used as the sole basis for treatment or other patient management decisions. A negative result may occur with  improper specimen collection/handling, submission of specimen other than nasopharyngeal swab,  presence of viral mutation(s) within the areas targeted by this assay, and inadequate number of viral copies(<138 copies/mL). A negative result must be combined with clinical observations, patient history, and epidemiological information. The expected result is Negative.  Fact Sheet for Patients:  BloggerCourse.com  Fact Sheet for Healthcare Providers:  SeriousBroker.it  This test is no t yet approved or cleared by the [place] FDA and  has been authorized for detection and/or diagnosis of SARS-CoV-2 by FDA under an Emergency Use Authorization (EUA). This EUA will remain  in effect (meaning this test can be used) for the duration of the COVID-19 declaration under Section 564(b)(1) of the Act, 21 U.S.C.section 360bbb-3(b)(1), unless the authorization is terminated  or revoked sooner.       Influenza A by PCR NEGATIVE NEGATIVE Final   Influenza B by PCR NEGATIVE NEGATIVE Final    Comment: (NOTE) The Xpert Xpress SARS-CoV-2/FLU/RSV plus assay is intended as an aid in the diagnosis of influenza from Nasopharyngeal swab specimens  and should not be used as a sole basis for treatment. Nasal washings and aspirates are unacceptable for Xpert Xpress SARS-CoV-2/FLU/RSV testing.  Fact Sheet for Patients: BloggerCourse.com  Fact Sheet for Healthcare Providers: SeriousBroker.it  This test is not yet approved or cleared by the [place] FDA and has been authorized for detection and/or diagnosis of SARS-CoV-2 by FDA under an Emergency Use Authorization (EUA). This EUA will remain in effect (meaning this test can be used) for the duration of the COVID-19 declaration under Section 564(b)(1) of the Act, 21 U.S.C. section 360bbb-3(b)(1), unless the authorization is terminated or revoked.  Performed at [facility], [address] [place], [place] [address]    Culture, blood (Routine X 2) w Reflex to ID Panel     Status: None   Collection Time: 03/28/21 11:20 AM   Specimen: BLOOD  Result Value Ref Range Status   Specimen Description   Final    BLOOD LEFT ARM Performed at [facility], [address] [place], [place] [address]    Special Requests   Final    BOTTLES DRAWN AEROBIC AND ANAEROBIC Blood Culture results may not be optimal due to an inadequate volume of blood received in culture bottles Performed at [facility], [address] [place], [place] [address]    Culture   Final    NO GROWTH 5 DAYS Performed at [facility], [address], [place], [place] [address]    Report Status 04/02/2021 FINAL  Final  Culture, Urine     Status: Abnormal   Collection Time: 03/28/21 12:00 PM   Specimen: Urine, Random  Result Value Ref Range Status   Specimen Description   Final    URINE, RANDOM Performed at [facility], [address], [place], [place] [address]    Special Requests   Final    NONE Performed at [facility], [address], [place], [place] [address]    Culture >=100,000 COLONIES/mL ESCHERICHIA COLI (A)  Final   Report Status 03/31/2021 FINAL  Final   Organism ID, Bacteria ESCHERICHIA COLI (A)  Final      Susceptibility   Escherichia coli - MIC*    AMPICILLIN <=2 SENSITIVE Sensitive     CEFAZOLIN <=4 SENSITIVE Sensitive     CEFEPIME <=0.12 SENSITIVE Sensitive     CEFTRIAXONE <=0.25 SENSITIVE Sensitive     CIPROFLOXACIN <=0.25 SENSITIVE Sensitive     GENTAMICIN <=1 SENSITIVE Sensitive     IMIPENEM <=0.25 SENSITIVE Sensitive     NITROFURANTOIN <=16 SENSITIVE Sensitive     TRIMETH/SULFA <=20 SENSITIVE Sensitive     AMPICILLIN/SULBACTAM <=2 SENSITIVE Sensitive     PIP/TAZO <=4 SENSITIVE Sensitive     * >=100,000 COLONIES/mL ESCHERICHIA COLI  Culture, blood (Routine X 2) w Reflex to ID Panel     Status: None (Preliminary result)    Collection Time: 03/28/21  6:27 PM   Specimen: BLOOD  Result Value Ref Range Status   Specimen Description   Final    BLOOD LEFT HAND Performed at [facility], [address] [place], [place] [address]    Special Requests   Final    BOTTLES DRAWN AEROBIC ONLY Blood Culture adequate volume   Culture   Final    NO GROWTH 4 DAYS Performed at [facility], [address] [place], [place] [address]    Report Status PENDING  Incomplete  Resp Panel by RT-PCR (Flu A&B,  Covid) Nasopharyngeal Swab     Status: None   Collection Time: 04/01/21 10:27 AM   Specimen: Nasopharyngeal Swab; Nasopharyngeal(NP) swabs in vial transport medium  Result Value Ref Range Status   SARS Coronavirus 2 by RT PCR NEGATIVE NEGATIVE Final    Comment: (NOTE) SARS-CoV-2 target nucleic acids are NOT DETECTED.  The SARS-CoV-2 RNA is generally detectable in upper respiratory specimens during the acute phase of infection. The lowest concentration of SARS-CoV-2 viral copies this assay can detect is 138 copies/mL. A negative result does not preclude SARS-Cov-2 infection and should not be used as the sole basis for treatment or other patient management decisions. A negative result may occur with  improper specimen collection/handling, submission of specimen other than nasopharyngeal swab, presence of viral mutation(s) within the areas targeted by this assay, and inadequate number of viral copies(<138 copies/mL). A negative result must be combined with clinical observations, patient history, and epidemiological information. The expected result is Negative.  Fact Sheet for Patients:  BloggerCourse.comhttps://www.fda.gov/media/152166/download  Fact Sheet for Healthcare Providers:  SeriousBroker.ithttps://www.fda.gov/media/152162/download  This test is no t yet approved or cleared by the [place] FDA and  has been authorized for detection and/or diagnosis of SARS-CoV-2 by FDA under an Emergency Use Authorization (EUA).  This EUA will remain  in effect (meaning this test can be used) for the duration of the COVID-19 declaration under Section 564(b)(1) of the Act, 21 U.S.C.section 360bbb-3(b)(1), unless the authorization is terminated  or revoked sooner.       Influenza A by PCR NEGATIVE NEGATIVE Final   Influenza B by PCR NEGATIVE NEGATIVE Final    Comment: (NOTE) The Xpert Xpress SARS-CoV-2/FLU/RSV plus assay is intended as an aid in the diagnosis of influenza from Nasopharyngeal swab specimens and should not be used as a sole basis for treatment. Nasal washings and aspirates are unacceptable for Xpert Xpress SARS-CoV-2/FLU/RSV testing.  Fact Sheet for Patients: BloggerCourse.comhttps://www.fda.gov/media/152166/download  Fact Sheet for Healthcare Providers: SeriousBroker.ithttps://www.fda.gov/media/152162/download  This test is not yet approved or cleared by the [place] FDA and has been authorized for detection and/or diagnosis of SARS-CoV-2 by FDA under an Emergency Use Authorization (EUA). This EUA will remain in effect (meaning this test can be used) for the duration of the COVID-19 declaration under Section 564(b)(1) of the Act, 21 U.S.C. section 360bbb-3(b)(1), unless the authorization is terminated or revoked.  Performed at [facility], [address] [place], [place] [address]      Radiology Studies: No results found. US RENAL  Final Result    CT Renal Stone Study  Final Result    DG Chest Port 1 View  Final Result      Scheduled Meds:  ALPRAZolam  0.5 mg Oral BID   calcium-vitamin D  1 tablet Oral BID   cephALEXin  500 mg Oral Q6H   feeding supplement  237 mL Oral BID BM   hydrocortisone cream   Topical BID   levothyroxine  75 mcg Oral Q0600   loratadine  10 mg Oral Q0600   melatonin  10 mg Oral QHS   mirtazapine  15 mg Oral QHS   multivitamin with minerals  1 tablet Oral Q0600   senna  1 tablet Oral Q0600   traZODone  75 mg Oral QHS   venlafaxine XR  75 mg Oral Q0600    PRN Meds: acetaminophen **OR** acetaminophen, food thickener, liver oil-zinc oxide, ondansetron **OR** ondansetron (ZOFRAN) IV Continuous Infusions:     LOS: 5 days  Time spent: Greater  than 50% of the 28 minute visit was spent in counseling/coordination of care for the patient as laid out in the A&P.   Arnetha Courser, MD [facility] 04/02/2021, 1:26 PM

## 2021-04-02 NOTE — NC FL2 (Signed)
[place] MEDICAID FL2 LEVEL OF CARE SCREENING TOOL     IDENTIFICATION  Patient Name: Gina Hodges Birthdate: January 28, 1927 Sex: female Admission Date (Current Location): 03/28/2021  [facility] and [place] Number:  Producer, television/film/video and Address:  [facility],  [address] [place], [place] [address]      Provider Number: [id number]  Attending Physician Name and Address:  Arnetha Courser, MD  Relative Name and Phone Number:  Lovett Calender [phone]    Current Level of Care: Hospital Recommended Level of Care: Skilled Nursing Facility Prior Approval Number:    Date Approved/Denied: 04/01/21 PASRR Number: [id number]  Discharge Plan: SNF (ALF)    Current Diagnoses: Patient Active Problem List   Diagnosis Date Noted   Malnutrition of moderate degree 03/31/2021   Pressure injury of skin 03/29/2021   Hypokalemia 03/29/2021   Prediabetes 03/29/2021   Multifocal pneumonia 03/06/2021   Sepsis (HCC) 11/10/2019   Fall 08/13/2019   SIRS (systemic inflammatory response syndrome) (HCC)    Ingrown nail 11/12/2013   Compression fracture of spine (HCC) 11/07/2013   UTI (urinary tract infection) 11/07/2013   Hypothyroidism 11/07/2013   Hyperlipidemia    Dementia with behavioral problem (HCC)    GERD (gastroesophageal reflux disease)    PAF (paroxysmal atrial fibrillation) (HCC)    Generalized anxiety disorder 04/05/2013    Orientation RESPIRATION BLADDER Height & Weight     Self  O2 (3L O2 [place]) Incontinent Weight: 62.5 kg Height:  5\' 1"  (154.9 cm)  BEHAVIORAL SYMPTOMS/MOOD NEUROLOGICAL BOWEL NUTRITION STATUS      Incontinent Diet (please see discharge summary)  AMBULATORY STATUS COMMUNICATION OF NEEDS Skin   Extensive Assist Verbally Normal                       Personal Care Assistance Level of Assistance  Bathing, Feeding, Dressing Bathing Assistance: Maximum assistance Feeding assistance: Maximum assistance Dressing Assistance: Maximum  assistance     Functional Limitations Info  Sight, Hearing, Speech Sight Info: Impaired Hearing Info: Impaired Speech Info: Adequate    SPECIAL CARE FACTORS FREQUENCY  PT (By licensed PT), OT (By licensed OT)     PT Frequency: Eval and Treat OT Frequency: Eval and Treat            Contractures Contractures Info: Not present    Additional Factors Info  Code Status, Allergies Code Status Info: DNR Allergies Info: Penicillins, Bactrim (Sulfamethoxazole-trimethoprim), Sulfa Antibiotics, Codeine           Current Medications (04/02/2021):  This is the current hospital active medication list Current Facility-Administered Medications  Medication Dose Route Frequency Provider Last Rate Last Admin   acetaminophen (TYLENOL) tablet 650 mg  650 mg Oral Q6H PRN 04/04/2021, Tyrone A, DO       Or   acetaminophen (TYLENOL) suppository 650 mg  650 mg Rectal Q6H PRN Ronaldo Miyamoto, Tyrone A, DO       ALPRAZolam Ronaldo Miyamoto) tablet 0.5 mg  0.5 mg Oral BID Prudy Feeler, Tyrone A, DO   0.5 mg at 04/02/21 1021   calcium-vitamin D (OSCAL WITH D) 500-200 MG-UNIT per tablet 1 tablet  1 tablet Oral BID 04/04/21, Tyrone A, DO   1 tablet at 04/02/21 1021   cephALEXin (KEFLEX) capsule 500 mg  500 mg Oral Q6H Amin, 04/04/21, MD   500 mg at 04/02/21 1021   feeding supplement (ENSURE ENLIVE / ENSURE PLUS) liquid 237 mL  237 mL Oral BID BM 04/04/21, MD   237 mL at  04/02/21 1022   food thickener (SIMPLYTHICK (NECTAR/LEVEL 2/MILDLY THICK)) 1 packet  1 packet Oral PRN Arnetha Courser, MD       hydrocortisone cream 1 %   Topical BID Ronaldo Miyamoto, Tyrone A, DO   Given at 04/02/21 1022   levothyroxine (SYNTHROID) tablet 75 mcg  75 mcg Oral Q0600 Margie Ege A, DO   75 mcg at 04/02/21 0554   liver oil-zinc oxide (DESITIN) 40 % ointment   Topical TID PRN Margie Ege A, DO       loratadine (CLARITIN) tablet 10 mg  10 mg Oral Q0600 Ronaldo Miyamoto, Tyrone A, DO   10 mg at 04/02/21 0554   melatonin tablet 10 mg  10 mg Oral QHS Kyle, Tyrone A, DO   10 mg at  04/01/21 2158   mirtazapine (REMERON) tablet 15 mg  15 mg Oral QHS Kyle, Tyrone A, DO   15 mg at 04/01/21 2158   multivitamin with minerals tablet 1 tablet  1 tablet Oral Q0600 Margie Ege A, DO   1 tablet at 04/02/21 0553   ondansetron (ZOFRAN) tablet 4 mg  4 mg Oral Q6H PRN Margie Ege A, DO       Or   ondansetron (ZOFRAN) injection 4 mg  4 mg Intravenous Q6H PRN Ronaldo Miyamoto, Tyrone A, DO   4 mg at 04/02/21 1021   senna (SENOKOT) tablet 8.6 mg  1 tablet Oral Q0600 Ronaldo Miyamoto, Tyrone A, DO   8.6 mg at 03/29/21 0609   traZODone (DESYREL) tablet 75 mg  75 mg Oral QHS Kyle, Tyrone A, DO   75 mg at 04/01/21 2158   venlafaxine XR (EFFEXOR-XR) 24 hr capsule 75 mg  75 mg Oral Q0600 Kyle, Tyrone A, DO   75 mg at 04/02/21 5176     Discharge Medications: Please see discharge summary for a list of discharge medications.  Relevant Imaging Results:  Relevant Lab Results:   Additional Information SS#[id number]  Geni Bers, RN

## 2021-04-02 NOTE — TOC Progression Note (Signed)
Transition of Care [facility]) - Progression Note    Patient Details  Name: Gina Hodges MRN: [id number] Date of Birth: May 16, 1927  Transition of Care [facility]) CM/SW Contact  Geni Bers, RN Phone Number: 04/02/2021, 5:26 PM  Clinical Narrative:    Spoke with pt's nephew Mr. Effie Shy who selected for pt tp stay in [facility] for SNF. [facility] was selected. Insurance auth started with [employer]. Pt's COVID vaccination card was faxed from [facility] 10/17/19, 11/14/19, 08/12/20. COVID test on 6/29 negative. Waiting for insurance auth.    Expected Discharge Plan: Assisted Living Barriers to Discharge: No Barriers Identified  Expected Discharge Plan and Services Expected Discharge Plan: Assisted Living       Living arrangements for the past 2 months: Assisted Living Facility                                       Social Determinants of Health (SDOH) Interventions    Readmission Risk Interventions Readmission Risk Prevention Plan 11/12/2019  Post Dischage Appt Not Complete  Appt Comments facility resident  Medication Screening Complete  Transportation Screening Complete  Some recent data might be hidden

## 2021-04-03 DIAGNOSIS — D696 Thrombocytopenia, unspecified: Secondary | ICD-10-CM

## 2021-04-03 LAB — CULTURE, BLOOD (ROUTINE X 2)
Culture: NO GROWTH
Special Requests: ADEQUATE

## 2021-04-03 MED ORDER — FOOD THICKENER (SIMPLYTHICK): 1.0000 | ORAL | Status: DC | PRN

## 2021-04-03 MED ORDER — ENSURE ENLIVE PO LIQD: 237.0000 mL | Freq: Two times a day (BID) | ORAL | 12 refills | Status: DC

## 2021-04-03 MED ORDER — COVID-19 MRNA VACC (MODERNA) 50 MCG/0.25ML IM SUSP
0.2500 mL | Freq: Once | INTRAMUSCULAR | Status: AC
  Administered 2021-04-03: 0.25 mL via INTRAMUSCULAR
  Filled 2021-04-03: qty 0.25

## 2021-04-03 MED ORDER — ZINC OXIDE 40 % EX OINT: TOPICAL_OINTMENT | Freq: Three times a day (TID) | CUTANEOUS | 0 refills | Status: DC | PRN

## 2021-04-03 MED ORDER — ALPRAZOLAM 0.5 MG PO TABS: 0.5000 mg | ORAL_TABLET | Freq: Two times a day (BID) | ORAL | 0 refills | Status: DC

## 2021-04-03 MED ORDER — CEPHALEXIN 500 MG PO CAPS: 500.0000 mg | ORAL_CAPSULE | Freq: Four times a day (QID) | ORAL | 0 refills | Status: DC

## 2021-04-03 MED ORDER — HYDROCORTISONE 1 % EX CREA
TOPICAL_CREAM | Freq: Two times a day (BID) | CUTANEOUS | 0 refills | Status: DC
Start: 2021-04-03 — End: 2022-12-07

## 2021-04-03 NOTE — TOC Progression Note (Addendum)
Transition of Care [facility]) - Progression Note    Patient Details  Name: Gina Hodges MRN: [id number] Date of Birth: [birth year]  Transition of Care [facility]) CM/SW Contact  Darleene Cleaver, [place] Phone Number: 04/03/2021, 6:52 PM  Clinical Narrative:     CSW was informed that patient's [employer] is denying patient for SNF placement.  [facility] offered a peer to peer, CSW informed attending physician.  Attending physician completed peer to peer, and patient was still denied.  CSW spoke to [place], and they offered the option to complete a fast appeal which can take up to 72 hours.  CSW spoke to patient's nephew and also to the [facility], and they requested to have the appeal completed.  CSW contacted [employer]'s fast appeal phone number [phone] and spoke to [place].  She requested that clinical information be faxed to [phone] and to reference case number [id number].    CSW informed Gabriel Rung that per [facility], patient is not back at her base line.  Patient is normally able to transfer herself independently from bed to wheelchair, wheelchair to bed, and to toilet.  Patient also normally able to propel herself in wheelchair with her legs and arms.  Patient is also normally able to feed herself.  Currently patient is max assistance with transfers per PT note, and also patient unable to feed herself at this time.  CSW also noted that patient has a stage 1 pressure ulcer on her heel per nursing notes.  CSW updated [facility] that patient's family is appealing decision.  Per [facility] if patient still denied after appeal, patient may be able to go to a SNF that has Medicaid beds.  Per [facility] sister facility [facility] in [place] has Medicaid beds available.    CSW to continue to follow patient's progress throughout discharge planning.   Expected Discharge Plan: Assisted Living Barriers to Discharge: No Barriers  Identified  Expected Discharge Plan and Services Expected Discharge Plan: Assisted Living       Living arrangements for the past 2 months: Assisted Living Facility Expected Discharge Date: 04/03/21                                     Social Determinants of Health (SDOH) Interventions    Readmission Risk Interventions Readmission Risk Prevention Plan 11/12/2019  Post Dischage Appt Not Complete  Appt Comments facility resident  Medication Screening Complete  Transportation Screening Complete  Some recent data might be hidden

## 2021-04-03 NOTE — Progress Notes (Signed)
Physical Therapy Treatment Patient Details Name: Edwin Cherian MRN: [id number] DOB: 1927-05-14 Today's Date: 04/03/2021    History of Present Illness Ms. Casady is a 87 yo female with PMH dementia, HLD, GERD, hypothyroidism, Afib, CVA  who presented with altered mental status 03/28/21.  Patient was  seen in the ED 03/27/21 for a fall from a wheelchair. CTof head  was negative. W/u revealed a UTI. She was started on abx and sent back to her facility.    PT Comments    Patient is much quieter and less talkative than than  previous PT visit. Patient  required total assistance to mobilize to sitting and  maintain balance in sitting then back to supine.  Continue PT to improve mobility and strength.  Follow Up Recommendations  SNF     Equipment Recommendations  None recommended by PT    Recommendations for Other Services       Precautions / Restrictions Precautions Precautions: Fall    Mobility  Bed Mobility   Bed Mobility: Supine to Sit;Sit to Supine     Supine to sit: Total assist;HOB elevated Sit to supine: Total assist   General bed mobility comments: patient did not initiate any mobility. Patient nodding head 'No" but did not verbalize. Use of bed pad  to lift trunk to upright,  total assist to return to supine.    Transfers                 General transfer comment: unable to attempt stand with one person - patient unable to maintain sitting balance.  Ambulation/Gait                 Stairs             Wheelchair Mobility    Modified Rankin (Stroke Patients Only)       Balance Overall balance assessment: Needs assistance Sitting-balance support: Feet supported Sitting balance-Leahy Scale: Zero Sitting balance - Comments: patient  not supporting self today whil;e sitting. required max assistnace. Last vist, patient able to sit at mid line and support self. Postural control: Left lateral lean                                   Cognition Arousal/Alertness: Awake/alert Behavior During Therapy: WFL for tasks assessed/performed Overall Cognitive Status: History of cognitive impairments - at baseline                                 General Comments: patient  minimally verbal today as opposed last PT visit.      Exercises      General Comments        Pertinent Vitals/Pain Pain Score: 0-No pain    Home Living                      Prior Function            PT Goals (current goals can now be found in the care plan section) Progress towards PT goals: Not progressing toward goals - comment (seems weaker today)    Frequency    Min 2X/week      PT Plan Current plan remains appropriate    Co-evaluation              AM-PAC PT "6 Clicks" Mobility   Outcome Measure  Help needed  turning from your back to your side while in a flat bed without using bedrails?: Total Help needed moving from lying on your back to sitting on the side of a flat bed without using bedrails?: Total Help needed moving to and from a bed to a chair (including a wheelchair)?: Total Help needed standing up from a chair using your arms (e.g., wheelchair or bedside chair)?: Total Help needed to walk in hospital room?: Total Help needed climbing 3-5 steps with a railing? : Total 6 Click Score: 6    End of Session Equipment Utilized During Treatment: Oxygen Activity Tolerance: Patient limited by fatigue Patient left: in bed;with call bell/phone within reach;with bed alarm set Nurse Communication: Mobility status;Need for lift equipment PT Visit Diagnosis: Unsteadiness on feet (R26.81);Other abnormalities of gait and mobility (R26.89);Muscle weakness (generalized) (M62.81)     Time: 9528-4132 PT Time Calculation (min) (ACUTE ONLY): 26 min  Charges:  $Therapeutic Activity: 23-37 mins                     Blanchard Kelch PT Acute Rehabilitation Services Pager [phone] Office  [phone]    Rada Hay 04/03/2021, 1:14 PM

## 2021-04-03 NOTE — [place] FL2 (Addendum)
[place] MEDICAID FL2 LEVEL OF CARE SCREENING TOOL     IDENTIFICATION  Patient Name: Gina Hodges Birthdate: March 08, 1927 Sex: female Admission Date (Current Location): 03/28/2021  [place] and [place] Number:  [address] [id number] S Facility and Address:  [facility],  [address], [place] [address]      Provider Number: [id number]  Attending Physician Name and Address:  Dimple Nanas, MD  Relative Name and Phone Number:  Lovett Calender [phone]    Current Level of Care: Hospital Recommended Level of Care: Skilled Nursing Facility Prior Approval Number:    Date Approved/Denied:  PASRR Number: [id number]  Discharge Plan: SNF    Current Diagnoses: Patient Active Problem List   Diagnosis Date Noted   Malnutrition of moderate degree 03/31/2021   Pressure injury of skin 03/29/2021   Hypokalemia 03/29/2021   Prediabetes 03/29/2021   Multifocal pneumonia 03/06/2021   Sepsis (HCC) 11/10/2019   Fall 08/13/2019   SIRS (systemic inflammatory response syndrome) (HCC)    Ingrown nail 11/12/2013   Compression fracture of spine (HCC) 11/07/2013   UTI (urinary tract infection) 11/07/2013   Hypothyroidism 11/07/2013   Hyperlipidemia    Dementia with behavioral problem (HCC)    GERD (gastroesophageal reflux disease)    PAF (paroxysmal atrial fibrillation) (HCC)    Generalized anxiety disorder 04/05/2013    Orientation RESPIRATION BLADDER Height & Weight     Self, Place  Normal Incontinent Weight: 137 lb 12.6 oz (62.5 kg) Height:  5\' 1"  (154.9 cm)  BEHAVIORAL SYMPTOMS/MOOD NEUROLOGICAL BOWEL NUTRITION STATUS      Incontinent Diet (please see discharge summary)  AMBULATORY STATUS COMMUNICATION OF NEEDS Skin   Extensive Assist Verbally PU Stage and Appropriate Care PU Stage 1 Dressing:  (PRN dressing changes)                     Personal Care Assistance Level of Assistance  Bathing, Feeding, Dressing Bathing Assistance: Limited  assistance Feeding assistance: Limited assistance Dressing Assistance: Limited assistance     Functional Limitations Info  Sight, Hearing, Speech Sight Info: Impaired Hearing Info: Impaired Speech Info: Adequate    SPECIAL CARE FACTORS FREQUENCY  PT (By licensed PT), OT (By licensed OT)     PT Frequency: Minimum 5x a week OT Frequency: Minimum 5x a week            Contractures Contractures Info: Not present    Additional Factors Info  Code Status, Allergies, Psychotropic Code Status Info: DNR Allergies Info: Penicillins, Bactrim (Sulfamethoxazole-trimethoprim), Sulfa Antibiotics, Codeine Psychotropic Info: ALPRAZolam (XANAX) tablet 0.5 mg, venlafaxine XR (EFFEXOR-XR) 24 hr capsule 75 mg, mirtazapine (REMERON) tablet 15 mg         Current Medications (04/03/2021):  This is the current hospital active medication list Current Facility-Administered Medications  Medication Dose Route Frequency Provider Last Rate Last Admin   acetaminophen (TYLENOL) tablet 650 mg  650 mg Oral Q6H PRN 06/04/2021, Tyrone A, DO       Or   acetaminophen (TYLENOL) suppository 650 mg  650 mg Rectal Q6H PRN Ronaldo Miyamoto, Tyrone A, DO       ALPRAZolam Ronaldo Miyamoto) tablet 0.5 mg  0.5 mg Oral BID Prudy Feeler, Tyrone A, DO   0.5 mg at 04/03/21 1011   calcium-vitamin D (OSCAL WITH D) 500-200 MG-UNIT per tablet 1 tablet  1 tablet Oral BID 06/04/21, Tyrone A, DO   1 tablet at 04/03/21 1011   cephALEXin (KEFLEX) capsule 500 mg  500 mg Oral Q6H 06/04/21, MD  500 mg at 04/03/21 1715   feeding supplement (ENSURE ENLIVE / ENSURE PLUS) liquid 237 mL  237 mL Oral BID BM Arnetha Courser, MD   237 mL at 04/03/21 1715   food thickener (SIMPLYTHICK (NECTAR/LEVEL 2/MILDLY THICK)) 1 packet  1 packet Oral PRN Arnetha Courser, MD       hydrocortisone cream 1 %   Topical BID Ronaldo Miyamoto, Tyrone A, DO   Given at 04/03/21 1011   levothyroxine (SYNTHROID) tablet 75 mcg  75 mcg Oral Q0600 Margie Ege A, DO   75 mcg at 04/03/21 0502   liver oil-zinc oxide  (DESITIN) 40 % ointment   Topical TID PRN Margie Ege A, DO       loratadine (CLARITIN) tablet 10 mg  10 mg Oral Q0600 Ronaldo Miyamoto, Tyrone A, DO   10 mg at 04/03/21 0503   melatonin tablet 10 mg  10 mg Oral QHS Kyle, Tyrone A, DO   10 mg at 04/02/21 2220   mirtazapine (REMERON) tablet 15 mg  15 mg Oral QHS Kyle, Tyrone A, DO   15 mg at 04/02/21 2223   multivitamin with minerals tablet 1 tablet  1 tablet Oral Q0600 Margie Ege A, DO   1 tablet at 04/03/21 0503   ondansetron (ZOFRAN) tablet 4 mg  4 mg Oral Q6H PRN Margie Ege A, DO       Or   ondansetron (ZOFRAN) injection 4 mg  4 mg Intravenous Q6H PRN Ronaldo Miyamoto, Tyrone A, DO   4 mg at 04/02/21 1021   senna (SENOKOT) tablet 8.6 mg  1 tablet Oral Q0600 Ronaldo Miyamoto, Tyrone A, DO   8.6 mg at 04/03/21 0503   traZODone (DESYREL) tablet 75 mg  75 mg Oral QHS Kyle, Tyrone A, DO   75 mg at 04/02/21 2221   venlafaxine XR (EFFEXOR-XR) 24 hr capsule 75 mg  75 mg Oral Q0600 Kyle, Tyrone A, DO   75 mg at 04/03/21 0507     Discharge Medications: Please see discharge summary for a list of discharge medications.  Relevant Imaging Results:  Relevant Lab Results:   Additional Information SSN [id number]  Darleene Cleaver, LCSW

## 2021-04-03 NOTE — Progress Notes (Signed)
PROGRESS NOTE    Gina Hodges  JJK:[id number] DOB: 09/23/1927 DOA: 03/28/2021 PCP: Lottie Dawson   Brief Narrative:  87 yo female with PMH dementia, HLD, GERD, hypothyroidism who presented with altered mental status.  Due to severe hard of hearing and cognitive impairment, she is a difficult historian. She had recently been seen in the ER for a fall from her wheelchair.  CT head was negative at that time and she was started on antibiotics for a presumed UTI and discharged back to her nursing home.  Due to ongoing concern for altered mentation, she was sent back to the ER for further evaluation.  UA was still consistent with infection and she was started on Rocephin. She was also started on fluids for possible dehydration.   Assessment & Plan:   Principal Problem:   UTI (urinary tract infection) Active Problems:   Dementia with behavioral problem (HCC)   Pressure injury of skin   Hypokalemia   Prediabetes   Malnutrition of moderate degree   UTI.  Urine culture with pansensitive E. coli. Finish 5 day course.    AKI.  Resolved.   Prediabetes.  A1c of 5.9. Diet controlled.    History of hypokalemia.  repleted.    Dementia with behavioral problem [facility]) - Patient has underlying chronic cognitive impairment from this and may have some acute worsening of her confusion and mood in setting of infection. CT head - neg.    Thrombocytopenia (HCC)-resolved as of 03/29/2021   Hyponatremia-resolved as of 03/30/2021  Generalized weakness.  PT/OT= SNF.     DVT prophylaxis:  SCDs Start: 03/28/21 1619 Code Status: DNR Family Communication:  Spoke with his [employer].   Status is: Inpatient  Remains inpatient appropriate because:Unsafe d/c plan  Dispo: The patient is from: Home              Anticipated d/c is to: ALF              Patient currently is medically stable to d/c.   Difficult to place patient No       Nutritional status  Nutrition Problem:  Moderate Malnutrition Etiology: chronic illness (dementia)  Signs/Symptoms: mild fat depletion, mild muscle depletion, moderate muscle depletion  Interventions: Ensure Enlive (each supplement provides 350kcal and 20 grams of protein), Magic cup, MVI  Body mass index is 26.03 kg/m.  Pressure Injury 03/28/21 Heel Left Stage 1 -  Intact skin with non-blanchable redness of a localized area usually over a bony prominence. (Active)  03/28/21 1700  Location: Heel  Location Orientation: Left  Staging: Stage 1 -  Intact skin with non-blanchable redness of a localized area usually over a bony prominence.  Wound Description (Comments):   Present on Admission: Yes    Subjective: Sitting up in the bed, trying to feed herself. No new complaints.   Review of Systems Otherwise negative except as per HPI, including: General: Denies fever, chills, night sweats or unintended weight loss. Resp: Denies cough, wheezing, shortness of breath. Cardiac: Denies chest pain, palpitations, orthopnea, paroxysmal nocturnal dyspnea. GI: Denies abdominal pain, nausea, vomiting, diarrhea or constipation GU: Denies dysuria, frequency, hesitancy or incontinence MS: Denies muscle aches, joint pain or swelling Neuro: Denies headache, neurologic deficits (focal weakness, numbness, tingling), abnormal gait Psych: Denies anxiety, depression, SI/HI/AVH Skin: Denies new rashes or lesions ID: Denies sick contacts, exotic exposures, travel  Examination:  Constitutional: Not in acute distress. Elderly frail.  Respiratory: Clear to auscultation bilaterally Cardiovascular: Normal sinus rhythm, no rubs Abdomen: Nontender  nondistended good bowel sounds Musculoskeletal: No edema noted Skin: No rashes seen Neurologic: CN 2-12 grossly intact.  And nonfocal Psychiatric: Difficult to asssess.    Objective: Vitals:   04/02/21 1318 04/02/21 2134 04/03/21 0656 04/03/21 1250  BP:  117/77 120/69 108/67  Pulse: 92 87 86 89   Resp:  20 20 18   Temp:  99.5 F (37.5 C) 98 F (36.7 C) 98.4 F (36.9 C)  TempSrc:  Oral Oral Oral  SpO2: 100% 94% 96% 98%  Weight:      Height:        Intake/Output Summary (Last 24 hours) at 04/03/2021 1406 Last data filed at 04/03/2021 1040 Gross per 24 hour  Intake 540 ml  Output 1100 ml  Net -560 ml   Filed Weights   03/28/21 1047 03/28/21 1606  Weight: 68 kg 62.5 kg     Data Reviewed:   CBC: Recent Labs  Lab 03/27/21 1420 03/28/21 1048 03/29/21 0351 03/30/21 0347  WBC 7.9 5.9 6.9 7.9  NEUTROABS 4.0 3.6  --  3.2  HGB 11.3* 13.2 11.0* 10.2*  HCT 36.0 38.0 36.1 32.9*  MCV 93.8 91.1 97.3 96.2  PLT 373 143* 222 309   Basic Metabolic Panel: Recent Labs  Lab 03/27/21 1420 03/28/21 1048 03/28/21 1642 03/29/21 0351 03/30/21 0347  NA 139 128*  --  139 140  K 4.3 3.3*  --  4.6 4.7  CL 101 89*  --  104 107  CO2 30 18*  --  29 28  GLUCOSE 144* 199*  --  103* 97  BUN 15 22  --  11 11  CREATININE 0.78 2.67*  --  0.71 0.72  CALCIUM 9.4 9.5  --  9.4 9.0  MG  --   --  1.8  --  1.7   GFR: Estimated Creatinine Clearance: 38.8 mL/min (by C-G formula based on SCr of 0.72 mg/dL). Liver Function Tests: Recent Labs  Lab 03/28/21 1048 03/29/21 0351  AST 70* 39  ALT 38 47*  ALKPHOS 91 509*  BILITOT 1.3* 1.0  PROT 9.0* 7.1  ALBUMIN 4.7 3.6   No results for input(s): LIPASE, AMYLASE in the last 168 hours. No results for input(s): AMMONIA in the last 168 hours. Coagulation Profile: No results for input(s): INR, PROTIME in the last 168 hours. Cardiac Enzymes: No results for input(s): CKTOTAL, CKMB, CKMBINDEX, TROPONINI in the last 168 hours. BNP (last 3 results) No results for input(s): PROBNP in the last 8760 hours. HbA1C: No results for input(s): HGBA1C in the last 72 hours. CBG: No results for input(s): GLUCAP in the last 168 hours. Lipid Profile: No results for input(s): CHOL, HDL, LDLCALC, TRIG, CHOLHDL, LDLDIRECT in the last 72 hours. Thyroid Function  Tests: No results for input(s): TSH, T4TOTAL, FREET4, T3FREE, THYROIDAB in the last 72 hours. Anemia Panel: No results for input(s): VITAMINB12, FOLATE, FERRITIN, TIBC, IRON, RETICCTPCT in the last 72 hours. Sepsis Labs: Recent Labs  Lab 03/28/21 1048  LATICACIDVEN 1.8    Recent Results (from the past 240 hour(s))  Urine culture     Status: Abnormal   Collection Time: 03/27/21  2:20 PM   Specimen: Urine, Clean Catch  Result Value Ref Range Status   Specimen Description   Final    URINE, CLEAN CATCH Performed at [facility], [address], [place], [place] [address]    Special Requests   Final    NONE Performed at [facility], [address], [place], [place] [address]  Culture >=100,000 COLONIES/mL ESCHERICHIA COLI (A)  Final   Report Status 03/30/2021 FINAL  Final   Organism ID, Bacteria ESCHERICHIA COLI (A)  Final      Susceptibility   Escherichia coli - MIC*    AMPICILLIN <=2 SENSITIVE Sensitive     CEFAZOLIN <=4 SENSITIVE Sensitive     CEFEPIME <=0.12 SENSITIVE Sensitive     CEFTRIAXONE <=0.25 SENSITIVE Sensitive     CIPROFLOXACIN <=0.25 SENSITIVE Sensitive     GENTAMICIN <=1 SENSITIVE Sensitive     IMIPENEM <=0.25 SENSITIVE Sensitive     NITROFURANTOIN <=16 SENSITIVE Sensitive     TRIMETH/SULFA <=20 SENSITIVE Sensitive     AMPICILLIN/SULBACTAM <=2 SENSITIVE Sensitive     PIP/TAZO <=4 SENSITIVE Sensitive     * >=100,000 COLONIES/mL ESCHERICHIA COLI  Resp Panel by RT-PCR (Flu A&B, Covid) Nasopharyngeal Swab     Status: None   Collection Time: 03/28/21 10:48 AM   Specimen: Nasopharyngeal Swab; Nasopharyngeal(NP) swabs in vial transport medium  Result Value Ref Range Status   SARS Coronavirus 2 by RT PCR NEGATIVE NEGATIVE Final    Comment: (NOTE) SARS-CoV-2 target nucleic acids are NOT DETECTED.  The SARS-CoV-2 RNA is generally detectable in upper respiratory specimens during the acute phase of infection. The  lowest concentration of SARS-CoV-2 viral copies this assay can detect is 138 copies/mL. A negative result does not preclude SARS-Cov-2 infection and should not be used as the sole basis for treatment or other patient management decisions. A negative result may occur with  improper specimen collection/handling, submission of specimen other than nasopharyngeal swab, presence of viral mutation(s) within the areas targeted by this assay, and inadequate number of viral copies(<138 copies/mL). A negative result must be combined with clinical observations, patient history, and epidemiological information. The expected result is Negative.  Fact Sheet for Patients:  BloggerCourse.com  Fact Sheet for Healthcare Providers:  SeriousBroker.it  This test is no t yet approved or cleared by the [place] FDA and  has been authorized for detection and/or diagnosis of SARS-CoV-2 by FDA under an Emergency Use Authorization (EUA). This EUA will remain  in effect (meaning this test can be used) for the duration of the COVID-19 declaration under Section 564(b)(1) of the Act, 21 U.S.C.section 360bbb-3(b)(1), unless the authorization is terminated  or revoked sooner.       Influenza A by PCR NEGATIVE NEGATIVE Final   Influenza B by PCR NEGATIVE NEGATIVE Final    Comment: (NOTE) The Xpert Xpress SARS-CoV-2/FLU/RSV plus assay is intended as an aid in the diagnosis of influenza from Nasopharyngeal swab specimens and should not be used as a sole basis for treatment. Nasal washings and aspirates are unacceptable for Xpert Xpress SARS-CoV-2/FLU/RSV testing.  Fact Sheet for Patients: BloggerCourse.com  Fact Sheet for Healthcare Providers: SeriousBroker.it  This test is not yet approved or cleared by the [place] FDA and has been authorized for detection and/or diagnosis of SARS-CoV-2 by FDA under  an Emergency Use Authorization (EUA). This EUA will remain in effect (meaning this test can be used) for the duration of the COVID-19 declaration under Section 564(b)(1) of the Act, 21 U.S.C. section 360bbb-3(b)(1), unless the authorization is terminated or revoked.  Performed at [facility], [address], [place], [place] [address]   Culture, blood (Routine X 2) w Reflex to ID Panel     Status: None   Collection Time: 03/28/21 11:20 AM   Specimen: BLOOD  Result Value Ref Range Status   Specimen Description  Final    BLOOD LEFT ARM Performed at [facility], [address] [place], [place] [address]    Special Requests   Final    BOTTLES DRAWN AEROBIC AND ANAEROBIC Blood Culture results may not be optimal due to an inadequate volume of blood received in culture bottles Performed at [facility], [address], [place], [place] [address]    Culture   Final    NO GROWTH 5 DAYS Performed at [facility], [address] [place], [place] [address]    Report Status 04/02/2021 FINAL  Final  Culture, Urine     Status: Abnormal   Collection Time: 03/28/21 12:00 PM   Specimen: Urine, Random  Result Value Ref Range Status   Specimen Description   Final    URINE, RANDOM Performed at [facility], [address] [place], [place] [address]    Special Requests   Final    NONE Performed at [facility], [address], [place], [place] [address]    Culture >=100,000 COLONIES/mL ESCHERICHIA COLI (A)  Final   Report Status 03/31/2021 FINAL  Final   Organism ID, Bacteria ESCHERICHIA COLI (A)  Final      Susceptibility   Escherichia coli - MIC*    AMPICILLIN <=2 SENSITIVE Sensitive     CEFAZOLIN <=4 SENSITIVE Sensitive     CEFEPIME <=0.12 SENSITIVE Sensitive     CEFTRIAXONE <=0.25 SENSITIVE Sensitive     CIPROFLOXACIN <=0.25 SENSITIVE Sensitive     GENTAMICIN <=1 SENSITIVE  Sensitive     IMIPENEM <=0.25 SENSITIVE Sensitive     NITROFURANTOIN <=16 SENSITIVE Sensitive     TRIMETH/SULFA <=20 SENSITIVE Sensitive     AMPICILLIN/SULBACTAM <=2 SENSITIVE Sensitive     PIP/TAZO <=4 SENSITIVE Sensitive     * >=100,000 COLONIES/mL ESCHERICHIA COLI  Culture, blood (Routine X 2) w Reflex to ID Panel     Status: None   Collection Time: 03/28/21  6:27 PM   Specimen: BLOOD  Result Value Ref Range Status   Specimen Description   Final    BLOOD LEFT HAND Performed at [facility], [address], [place], [place] [address]    Special Requests   Final    BOTTLES DRAWN AEROBIC ONLY Blood Culture adequate volume   Culture   Final    NO GROWTH 5 DAYS Performed at [facility], [address], [place], [place] [address]    Report Status 04/03/2021 FINAL  Final  Resp Panel by RT-PCR (Flu A&B, Covid) Nasopharyngeal Swab     Status: None   Collection Time: 04/01/21 10:27 AM   Specimen: Nasopharyngeal Swab; Nasopharyngeal(NP) swabs in vial transport medium  Result Value Ref Range Status   SARS Coronavirus 2 by RT PCR NEGATIVE NEGATIVE Final    Comment: (NOTE) SARS-CoV-2 target nucleic acids are NOT DETECTED.  The SARS-CoV-2 RNA is generally detectable in upper respiratory specimens during the acute phase of infection. The lowest concentration of SARS-CoV-2 viral copies this assay can detect is 138 copies/mL. A negative result does not preclude SARS-Cov-2 infection and should not be used as the sole basis for treatment or other patient management decisions. A negative result may occur with  improper specimen collection/handling, submission of specimen other than nasopharyngeal swab, presence of viral mutation(s) within the areas targeted by this assay, and inadequate number of viral copies(<138 copies/mL). A negative result must be combined with clinical observations, patient history, and epidemiological information.  The expected result is  Negative.  Fact Sheet for Patients:  BloggerCourse.com  Fact Sheet for Healthcare Providers:  SeriousBroker.it  This test is no t yet approved or cleared by the [place] FDA and  has been authorized for detection and/or diagnosis of SARS-CoV-2 by FDA under an Emergency Use Authorization (EUA). This EUA will remain  in effect (meaning this test can be used) for the duration of the COVID-19 declaration under Section 564(b)(1) of the Act, 21 U.S.C.section 360bbb-3(b)(1), unless the authorization is terminated  or revoked sooner.       Influenza A by PCR NEGATIVE NEGATIVE Final   Influenza B by PCR NEGATIVE NEGATIVE Final    Comment: (NOTE) The Xpert Xpress SARS-CoV-2/FLU/RSV plus assay is intended as an aid in the diagnosis of influenza from Nasopharyngeal swab specimens and should not be used as a sole basis for treatment. Nasal washings and aspirates are unacceptable for Xpert Xpress SARS-CoV-2/FLU/RSV testing.  Fact Sheet for Patients: BloggerCourse.com  Fact Sheet for Healthcare Providers: SeriousBroker.it  This test is not yet approved or cleared by the [place] FDA and has been authorized for detection and/or diagnosis of SARS-CoV-2 by FDA under an Emergency Use Authorization (EUA). This EUA will remain in effect (meaning this test can be used) for the duration of the COVID-19 declaration under Section 564(b)(1) of the Act, 21 U.S.C. section 360bbb-3(b)(1), unless the authorization is terminated or revoked.  Performed at [facility], [address], [place], [place] [address]          Radiology Studies: No results found.      Scheduled Meds:  ALPRAZolam  0.5 mg Oral BID   calcium-vitamin D  1 tablet Oral BID   cephALEXin  500 mg Oral Q6H   feeding supplement  237 mL Oral BID BM   hydrocortisone cream   Topical BID    levothyroxine  75 mcg Oral Q0600   loratadine  10 mg Oral Q0600   melatonin  10 mg Oral QHS   mirtazapine  15 mg Oral QHS   multivitamin with minerals  1 tablet Oral Q0600   senna  1 tablet Oral Q0600   traZODone  75 mg Oral QHS   venlafaxine XR  75 mg Oral Q0600   Continuous Infusions:   LOS: 6 days   Time spent= 35 mins    Magda Muise Joline Maxcy, MD [facility]  If 7PM-7AM, please contact night-coverage  04/03/2021, 2:06 PM

## 2021-04-04 DIAGNOSIS — Z515 Encounter for palliative care: Secondary | ICD-10-CM

## 2021-04-04 DIAGNOSIS — Z7189 Other specified counseling: Secondary | ICD-10-CM

## 2021-04-04 NOTE — Progress Notes (Signed)
PROGRESS NOTE    Gina Hodges  SVX:[id number] DOB: 06-Dec-1926 DOA: 03/28/2021 PCP: Lottie Dawson   Brief Narrative:  87 yo female with PMH dementia, HLD, GERD, hypothyroidism who presented with altered mental status.  Due to severe hard of hearing and cognitive impairment, she is a difficult historian. She had recently been seen in the ER for a fall from her wheelchair.  CT head was negative at that time and she was started on antibiotics for a presumed UTI and discharged back to her nursing home.  Due to ongoing concern for altered mentation, she was sent back to the ER for further evaluation.  UA was still consistent with infection and she was started on Rocephin. She was also started on fluids for possible dehydration.  PT recommended SNF but insurance declined.  Palliative care team was also consulted given overall poor prognosis and guarded condition.   Assessment & Plan:   Principal Problem:   UTI (urinary tract infection) Active Problems:   Dementia with behavioral problem (HCC)   Pressure injury of skin   Hypokalemia   Prediabetes   Malnutrition of moderate degree   UTI.  Urine culture with pansensitive E. coli. Finish 5 day course.  Last day today   AKI.  Resolved.   Prediabetes.  A1c of 5.9. Diet controlled.    History of hypokalemia.  repleted.    Dementia with behavioral problem [facility]) - Patient has underlying chronic cognitive impairment from this and may have some acute worsening of her confusion and mood in setting of infection. CT head - neg.    Thrombocytopenia (HCC)-resolved as of 03/29/2021   Hyponatremia-resolved as of 03/30/2021  Generalized weakness.  PT/OT= SNF but denied by insurance.    DVT prophylaxis:  SCDs Start: 03/28/21 1619 Code Status: DNR Family Communication: Tamsen Meek has been updated periodically  Status is: Inpatient  Remains inpatient appropriate because:Unsafe d/c plan.  Case management working on safe  disposition planning  Dispo: The patient is from: Home              Anticipated d/c is to: ALF              Patient currently is medically stable to d/c.   Difficult to place patient No       Nutritional status  Nutrition Problem: Moderate Malnutrition Etiology: chronic illness (dementia)  Signs/Symptoms: mild fat depletion, mild muscle depletion, moderate muscle depletion  Interventions: Ensure Enlive (each supplement provides 350kcal and 20 grams of protein), Magic cup, MVI  Body mass index is 26.03 kg/m.  Pressure Injury 03/28/21 Heel Left Stage 1 -  Intact skin with non-blanchable redness of a localized area usually over a bony prominence. (Active)  03/28/21 1700  Location: Heel  Location Orientation: Left  Staging: Stage 1 -  Intact skin with non-blanchable redness of a localized area usually over a bony prominence.  Wound Description (Comments):   Present on Admission: Yes    Subjective: Resting this morning, no complaints.  Review of Systems Otherwise negative except as per HPI, including: General: Denies fever, chills, night sweats or unintended weight loss. Resp: Denies cough, wheezing, shortness of breath. Cardiac: Denies chest pain, palpitations, orthopnea, paroxysmal nocturnal dyspnea. GI: Denies abdominal pain, nausea, vomiting, diarrhea or constipation GU: Denies dysuria, frequency, hesitancy or incontinence MS: Denies muscle aches, joint pain or swelling Neuro: Denies headache, neurologic deficits (focal weakness, numbness, tingling), abnormal gait Psych: Denies anxiety, depression, SI/HI/AVH Skin: Denies new rashes or lesions ID: Denies sick contacts, exotic  exposures, travel  Examination:  Constitutional: Not in acute distress, elderly frail Respiratory: Clear to auscultation bilaterally Cardiovascular: Normal sinus rhythm, no rubs Abdomen: Nontender nondistended good bowel sounds Musculoskeletal: No edema noted Skin: No rashes  seen Neurologic: CN 2-12 grossly intact.  And nonfocal Psychiatric: Poor judgment and insight follow-up will  Objective: Vitals:   04/03/21 0656 04/03/21 1250 04/03/21 2052 04/04/21 0538  BP: 120/69 108/67 111/77 (!) 144/92  Pulse: 86 89 97 81  Resp: Temp: 98 F (36.7 C) 98.4 F (36.9 C) 98.4 F (36.9 C) 98.1 F (36.7 C)  TempSrc: Oral Oral Oral Oral  SpO2: 96% 98% 93% 94%  Weight:      Height:        Intake/Output Summary (Last 24 hours) at 04/04/2021 0941 Last data filed at 04/04/2021 0935 Gross per 24 hour  Intake 1200 ml  Output 600 ml  Net 600 ml   Filed Weights   03/28/21 1047 03/28/21 1606  Weight: 68 kg 62.5 kg     Data Reviewed:   CBC: Recent Labs  Lab 03/28/21 1048 03/29/21 0351 03/30/21 0347  WBC 5.9 6.9 7.9  NEUTROABS 3.6  --  3.2  HGB 13.2 11.0* 10.2*  HCT 38.0 36.1 32.9*  MCV 91.1 97.3 96.2  PLT 143* 222 309   Basic Metabolic Panel: Recent Labs  Lab 03/28/21 1048 03/28/21 1642 03/29/21 0351 03/30/21 0347  NA 128*  --  139 140  K 3.3*  --  4.6 4.7  CL 89*  --  104 107  CO2 18*  --  29 28  GLUCOSE 199*  --  103* 97  BUN 22  --  11 11  CREATININE 2.67*  --  0.71 0.72  CALCIUM 9.5  --  9.4 9.0  MG  --  1.8  --  1.7   GFR: Estimated Creatinine Clearance: 38.8 mL/min (by C-G formula based on SCr of 0.72 mg/dL). Liver Function Tests: Recent Labs  Lab 03/28/21 1048 03/29/21 0351  AST 70* 39  ALT 38 47*  ALKPHOS 91 509*  BILITOT 1.3* 1.0  PROT 9.0* 7.1  ALBUMIN 4.7 3.6   No results for input(s): LIPASE, AMYLASE in the last 168 hours. No results for input(s): AMMONIA in the last 168 hours. Coagulation Profile: No results for input(s): INR, PROTIME in the last 168 hours. Cardiac Enzymes: No results for input(s): CKTOTAL, CKMB, CKMBINDEX, TROPONINI in the last 168 hours. BNP (last 3 results) No results for input(s): PROBNP in the last 8760 hours. HbA1C: No results for input(s): HGBA1C in the last 72 hours. CBG: No  results for input(s): GLUCAP in the last 168 hours. Lipid Profile: No results for input(s): CHOL, HDL, LDLCALC, TRIG, CHOLHDL, LDLDIRECT in the last 72 hours. Thyroid Function Tests: No results for input(s): TSH, T4TOTAL, FREET4, T3FREE, THYROIDAB in the last 72 hours. Anemia Panel: No results for input(s): VITAMINB12, FOLATE, FERRITIN, TIBC, IRON, RETICCTPCT in the last 72 hours. Sepsis Labs: Recent Labs  Lab 03/28/21 1048  LATICACIDVEN 1.8    Recent Results (from the past 240 hour(s))  Urine culture     Status: Abnormal   Collection Time: 03/27/21  2:20 PM   Specimen: Urine, Clean Catch  Result Value Ref Range Status   Specimen Description   Final    URINE, CLEAN CATCH Performed at [facility], [address], [place], [place] [address]    Special Requests   Final    NONE Performed at [facility], [address], [place], [place] [address]    Culture >=100,000 COLONIES/mL ESCHERICHIA COLI (A)  Final   Report Status 03/30/2021 FINAL  Final   Organism ID, Bacteria ESCHERICHIA COLI (A)  Final      Susceptibility   Escherichia coli - MIC*    AMPICILLIN <=2 SENSITIVE Sensitive     CEFAZOLIN <=4 SENSITIVE Sensitive     CEFEPIME <=0.12 SENSITIVE Sensitive     CEFTRIAXONE <=0.25 SENSITIVE Sensitive     CIPROFLOXACIN <=0.25 SENSITIVE Sensitive     GENTAMICIN <=1 SENSITIVE Sensitive     IMIPENEM <=0.25 SENSITIVE Sensitive     NITROFURANTOIN <=16 SENSITIVE Sensitive     TRIMETH/SULFA <=20 SENSITIVE Sensitive     AMPICILLIN/SULBACTAM <=2 SENSITIVE Sensitive     PIP/TAZO <=4 SENSITIVE Sensitive     * >=100,000 COLONIES/mL ESCHERICHIA COLI  Resp Panel by RT-PCR (Flu A&B, Covid) Nasopharyngeal Swab     Status: None   Collection Time: 03/28/21 10:48 AM   Specimen: Nasopharyngeal Swab; Nasopharyngeal(NP) swabs in vial transport medium  Result Value Ref Range Status   SARS Coronavirus 2 by RT PCR NEGATIVE NEGATIVE Final    Comment:  (NOTE) SARS-CoV-2 target nucleic acids are NOT DETECTED.  The SARS-CoV-2 RNA is generally detectable in upper respiratory specimens during the acute phase of infection. The lowest concentration of SARS-CoV-2 viral copies this assay can detect is 138 copies/mL. A negative result does not preclude SARS-Cov-2 infection and should not be used as the sole basis for treatment or other patient management decisions. A negative result may occur with  improper specimen collection/handling, submission of specimen other than nasopharyngeal swab, presence of viral mutation(s) within the areas targeted by this assay, and inadequate number of viral copies(<138 copies/mL). A negative result must be combined with clinical observations, patient history, and epidemiological information. The expected result is Negative.  Fact Sheet for Patients:  BloggerCourse.com  Fact Sheet for Healthcare Providers:  SeriousBroker.it  This test is no t yet approved or cleared by the [place] FDA and  has been authorized for detection and/or diagnosis of SARS-CoV-2 by FDA under an Emergency Use Authorization (EUA). This EUA will remain  in effect (meaning this test can be used) for the duration of the COVID-19 declaration under Section 564(b)(1) of the Act, 21 U.S.C.section 360bbb-3(b)(1), unless the authorization is terminated  or revoked sooner.       Influenza A by PCR NEGATIVE NEGATIVE Final   Influenza B by PCR NEGATIVE NEGATIVE Final    Comment: (NOTE) The Xpert Xpress SARS-CoV-2/FLU/RSV plus assay is intended as an aid in the diagnosis of influenza from Nasopharyngeal swab specimens and should not be used as a sole basis for treatment. Nasal washings and aspirates are unacceptable for Xpert Xpress SARS-CoV-2/FLU/RSV testing.  Fact Sheet for Patients: BloggerCourse.com  Fact Sheet for Healthcare  Providers: SeriousBroker.it  This test is not yet approved or cleared by the [place] FDA and has been authorized for detection and/or diagnosis of SARS-CoV-2 by FDA under an Emergency Use Authorization (EUA). This EUA will remain in effect (meaning this test can be used) for the duration of the COVID-19 declaration under Section 564(b)(1) of the Act, 21 U.S.C. section 360bbb-3(b)(1), unless the authorization is terminated or revoked.  Performed at [facility], [address], [place], [place] [address]   Culture, blood (Routine X 2) w Reflex to ID Panel     Status: None   Collection Time: 03/28/21 11:20 AM   Specimen: BLOOD  Result Value Ref Range Status   Specimen Description   Final    BLOOD LEFT ARM Performed at [facility], [address] [place], [place] [address]    Special Requests   Final    BOTTLES DRAWN AEROBIC AND ANAEROBIC Blood Culture results may not be optimal due to an inadequate volume of blood received in culture bottles Performed at [facility], [address] [place], [place] [address]    Culture   Final    NO GROWTH 5 DAYS Performed at [facility], [address], [place], [place] [address]    Report Status 04/02/2021 FINAL  Final  Culture, Urine     Status: Abnormal   Collection Time: 03/28/21 12:00 PM   Specimen: Urine, Random  Result Value Ref Range Status   Specimen Description   Final    URINE, RANDOM Performed at [facility], [address], [place], [place] [address]    Special Requests   Final    NONE Performed at [facility], [address] [place], [place] [address]    Culture >=100,000 COLONIES/mL ESCHERICHIA COLI (A)  Final   Report Status 03/31/2021 FINAL  Final   Organism ID, Bacteria ESCHERICHIA COLI (A)  Final      Susceptibility   Escherichia coli - MIC*    AMPICILLIN <=2 SENSITIVE Sensitive      CEFAZOLIN <=4 SENSITIVE Sensitive     CEFEPIME <=0.12 SENSITIVE Sensitive     CEFTRIAXONE <=0.25 SENSITIVE Sensitive     CIPROFLOXACIN <=0.25 SENSITIVE Sensitive     GENTAMICIN <=1 SENSITIVE Sensitive     IMIPENEM <=0.25 SENSITIVE Sensitive     NITROFURANTOIN <=16 SENSITIVE Sensitive     TRIMETH/SULFA <=20 SENSITIVE Sensitive     AMPICILLIN/SULBACTAM <=2 SENSITIVE Sensitive     PIP/TAZO <=4 SENSITIVE Sensitive     * >=100,000 COLONIES/mL ESCHERICHIA COLI  Culture, blood (Routine X 2) w Reflex to ID Panel     Status: None   Collection Time: 03/28/21  6:27 PM   Specimen: BLOOD  Result Value Ref Range Status   Specimen Description   Final    BLOOD LEFT HAND Performed at [facility], [address] [place], [place] [address]    Special Requests   Final    BOTTLES DRAWN AEROBIC ONLY Blood Culture adequate volume   Culture   Final    NO GROWTH 5 DAYS Performed at [facility], [address], [place], [place] [address]    Report Status 04/03/2021 FINAL  Final  Resp Panel by RT-PCR (Flu A&B, Covid) Nasopharyngeal Swab     Status: None   Collection Time: 04/01/21 10:27 AM   Specimen: Nasopharyngeal Swab; Nasopharyngeal(NP) swabs in vial transport medium  Result Value Ref Range Status   SARS Coronavirus 2 by RT PCR NEGATIVE NEGATIVE Final    Comment: (NOTE) SARS-CoV-2 target nucleic acids are NOT DETECTED.  The SARS-CoV-2 RNA is generally detectable in upper respiratory specimens during the acute phase of infection. The lowest concentration of SARS-CoV-2 viral copies this assay can detect is 138 copies/mL. A negative result does not preclude SARS-Cov-2 infection and should not be used as the sole basis for treatment or other patient management decisions. A negative result may occur with  improper specimen collection/handling, submission of specimen other than nasopharyngeal swab, presence of viral mutation(s) within the areas targeted by this assay, and  inadequate number of viral copies(<138 copies/mL). A negative  result must be combined with clinical observations, patient history, and epidemiological information. The expected result is Negative.  Fact Sheet for Patients:  BloggerCourse.comhttps://www.fda.gov/media/152166/download  Fact Sheet for Healthcare Providers:  SeriousBroker.ithttps://www.fda.gov/media/152162/download  This test is no t yet approved or cleared by the [place] FDA and  has been authorized for detection and/or diagnosis of SARS-CoV-2 by FDA under an Emergency Use Authorization (EUA). This EUA will remain  in effect (meaning this test can be used) for the duration of the COVID-19 declaration under Section 564(b)(1) of the Act, 21 U.S.C.section 360bbb-3(b)(1), unless the authorization is terminated  or revoked sooner.       Influenza A by PCR NEGATIVE NEGATIVE Final   Influenza B by PCR NEGATIVE NEGATIVE Final    Comment: (NOTE) The Xpert Xpress SARS-CoV-2/FLU/RSV plus assay is intended as an aid in the diagnosis of influenza from Nasopharyngeal swab specimens and should not be used as a sole basis for treatment. Nasal washings and aspirates are unacceptable for Xpert Xpress SARS-CoV-2/FLU/RSV testing.  Fact Sheet for Patients: BloggerCourse.comhttps://www.fda.gov/media/152166/download  Fact Sheet for Healthcare Providers: SeriousBroker.ithttps://www.fda.gov/media/152162/download  This test is not yet approved or cleared by the [place] FDA and has been authorized for detection and/or diagnosis of SARS-CoV-2 by FDA under an Emergency Use Authorization (EUA). This EUA will remain in effect (meaning this test can be used) for the duration of the COVID-19 declaration under Section 564(b)(1) of the Act, 21 U.S.C. section 360bbb-3(b)(1), unless the authorization is terminated or revoked.  Performed at [facility], [address] [place], [place] [address]          Radiology Studies: No results found.      Scheduled Meds:   ALPRAZolam  0.5 mg Oral BID   calcium-vitamin D  1 tablet Oral BID   cephALEXin  500 mg Oral Q6H   feeding supplement  237 mL Oral BID BM   hydrocortisone cream   Topical BID   levothyroxine  75 mcg Oral Q0600   loratadine  10 mg Oral Q0600   melatonin  10 mg Oral QHS   mirtazapine  15 mg Oral QHS   multivitamin with minerals  1 tablet Oral Q0600   senna  1 tablet Oral Q0600   traZODone  75 mg Oral QHS   venlafaxine XR  75 mg Oral Q0600   Continuous Infusions:   LOS: 7 days   Time spent= 35 mins    Shanvi Moyd Joline Maxcyhirag Atisha Hamidi, MD [facility]  If 7PM-7AM, please contact night-coverage  04/04/2021, 9:41 AM

## 2021-04-05 LAB — CBC
HCT: 39.6 % (ref 36.0–46.0)
Hemoglobin: 12.3 g/dL (ref 12.0–15.0)
MCH: 29.9 pg (ref 26.0–34.0)
MCHC: 31.1 g/dL (ref 30.0–36.0)
MCV: 96.1 fL (ref 80.0–100.0)
Platelets: 291 10*3/uL (ref 150–400)
RBC: 4.12 MIL/uL (ref 3.87–5.11)
RDW: 16.4 % — ABNORMAL HIGH (ref 11.5–15.5)
WBC: 8 10*3/uL (ref 4.0–10.5)
nRBC: 0 % (ref 0.0–0.2)

## 2021-04-05 LAB — BASIC METABOLIC PANEL
Anion gap: 9 (ref 5–15)
BUN: 29 mg/dL — ABNORMAL HIGH (ref 8–23)
CO2: 32 mmol/L (ref 22–32)
Calcium: 9.9 mg/dL (ref 8.9–10.3)
Chloride: 100 mmol/L (ref 98–111)
Creatinine, Ser: 0.74 mg/dL (ref 0.44–1.00)
GFR, Estimated: >60 mL/min (ref >60)
Glucose, Bld: 110 mg/dL — ABNORMAL HIGH (ref 70–99)
Potassium: 4.7 mmol/L (ref 3.5–5.1)
Sodium: 141 mmol/L (ref 135–145)

## 2021-04-05 LAB — BRAIN NATRIURETIC PEPTIDE: B Natriuretic Peptide: 40.7 pg/mL (ref 0.0–100.0)

## 2021-04-05 LAB — MAGNESIUM: Magnesium: 1.9 mg/dL (ref 1.7–2.4)

## 2021-04-05 MED ORDER — IPRATROPIUM-ALBUTEROL 0.5-2.5 (3) MG/3ML IN SOLN: 3.0000 mL | RESPIRATORY_TRACT | Status: DC | PRN

## 2021-04-05 NOTE — Consult Note (Addendum)
Consultation Note Date: 04/05/2021   Patient Name: Gina Hodges  DOB: [birth year]  MRN: [id number]  Age / Sex: 87 y.o., female  PCP: Lottie Dawson Referring Physician: Dimple Nanas, MD  Reason for Consultation: Establishing goals of care  HPI/Patient Profile: 87 y.o. female  with past medical history of dementia, hyperlipidemia, GERD, hypothyroidism admitted on 03/28/2021 with altered mental status and found to have UTI.  She was recently seen in the ED from a fall from her wheelchair and she was started on antibiotics and sent back to assisted living.  Due to concern for continued altered mental status, she was sent back to the ED for further evaluation.  She had been treated for UTI with Rocephin and fluids.  Recommendation was for PT but this was denied by insurance.  Palliative consulted for goals of care.  Clinical Assessment and Goals of Care: Palliative care consult received.  Chart reviewed including personal review of pertinent labs and imaging.  Discussed with bedside care team including tech who reports that she has been eating 100% of her meals today.  I saw and examined Gina Hodges today.  She was lying in bed in no distress.  She was very happy to have somebody in her room with her and asked me multiple times not to leave.  She does not have insight into her medical conditions to participate in goals of care conversations.  She denies any complaints today other than reporting that " my dress is up in my crack."  I attempted to explain to her that this was actually a pure wick device that she was feeling, but she was insistent that was not the case.  I called and was able to reach patient's nephew, Mr. Effie Shy.   I introduced palliative care as specialized medical care for people living with serious illness. It focuses on providing relief from the symptoms and stress of a serious  illness. The goal is to improve quality of life for both the patient and the family.  We discussed clinical course as well as wishes moving forward in regard to advanced directives.  Concepts specific to code status and rehospitalization discussed.  He understands she has progressive dementia and is going to continue to decline in her nutrition, cognition, and functional status over time.  His primary goal was to make sure that she is someplace that she is safe and cared for.  Concept of Hospice and Palliative Care were discussed.  We discussed that at this time, however, I did not think that she would qualify for hospice support based upon her dementia diagnosis as her functional status is higher than level 7 on the functional assessment staging tool gently used by hospice to determine eligibility based upon dementia diagnosis.  I told him that I would reach out to transition of care team on Monday to see if there are further things with which our team can be of assistance moving forward.   Questions and concerns addressed.   PMT will continue to support holistically.  NEXT OF KIN: Kathlen Brunswick   SUMMARY OF RECOMMENDATIONS   -DNR/DNI -Discussed with patient's nephew/POA.  Primary concern at this point is to find safe care environment for Gina Hodges.   -While Gina Hodges is elderly and has progressive dementia, her current FAST Score is less than a 7 and she would not qualify for hospice with primary diagnosis of dementia.  Question if she would qualify based upon dementia in setting of other comorbidities. -Recommend outpatient palliative care to follow her when she is discharged as a can help navigate when she would be eligible to consider hospice benefits in the future. -Unfortunately, no further palliative specific recommendations at this point.  I will plan to check in with transition of care on Monday to see if there are other areas or ways with which we can be of assistance in the care of Ms.  Hodges moving forward.  Code Status/Advance Care Planning: DNR  Palliative Prophylaxis:  Delirium Protocol  Additional Recommendations (Limitations, Scope, Preferences): Avoid Hospitalization  Psycho-social/Spiritual:  Desire for further Chaplaincy support:no Additional Recommendations: Caregiving  Support/Resources  Discharge Planning: To Be Determined      Primary Diagnoses: Present on Admission:  (Resolved) AKI (acute kidney injury) (HCC)  Dementia with behavioral problem (HCC)  UTI (urinary tract infection)   I have reviewed the medical record, interviewed the patient and family, and examined the patient. The following aspects are pertinent.  Past Medical History:  Diagnosis Date   Anemia    Anxiety    Cerebral infarct (HCC)    Dementia (HCC)    Dementia with behavioral problem (HCC)    GERD (gastroesophageal reflux disease)    Hyperlipidemia    Osteoporosis    PAF (paroxysmal atrial fibrillation) (HCC)    Stroke (HCC)    Thyroid disease    Ulcerative esophagitis    UTI (urinary tract infection)    Vertigo    Social History   Socioeconomic History   Marital status: Married    Spouse name: Not on file   Number of children: Not on file   Years of education: Not on file   Highest education level: Not on file  Occupational History   Not on file  Tobacco Use   Smoking status: Never   Smokeless tobacco: Never  Substance and Sexual Activity   Alcohol use: No   Drug use: No   Sexual activity: Never    Partners: Male  Other Topics Concern   Not on file  Social History Narrative   Not on file   Social Determinants of Health   Financial Resource Strain: Not on file  Food Insecurity: Not on file  Transportation Needs: Not on file  Physical Activity: Not on file  Stress: Not on file  Social Connections: Not on file   Family History  Problem Relation Age of Onset   Depression Mother    Bipolar disorder Daughter    Drug abuse Son    Scheduled  Meds:  ALPRAZolam  0.5 mg Oral BID   calcium-vitamin D  1 tablet Oral BID   cephALEXin  500 mg Oral Q6H   feeding supplement  237 mL Oral BID BM   hydrocortisone cream   Topical BID   levothyroxine  75 mcg Oral Q0600   loratadine  10 mg Oral Q0600   melatonin  10 mg Oral QHS   mirtazapine  15 mg Oral QHS   multivitamin with minerals  1 tablet Oral Q0600   senna  1 tablet Oral  [id number]   traZODone  75 mg Oral QHS   venlafaxine XR  75 mg Oral Q0600   Continuous Infusions: PRN Meds:.acetaminophen **OR** acetaminophen, food thickener, liver oil-zinc oxide, ondansetron **OR** ondansetron (ZOFRAN) IV Medications Prior to Admission:  Prior to Admission medications   Medication Sig Start Date End Date Taking? Authorizing Provider  acetaminophen (TYLENOL) 650 MG CR tablet Take 650 mg by mouth 3 (three) times daily.   Yes [provider]  alum & mag hydroxide-simeth (MAALOX/MYLANTA) 200-200-20 MG/5ML suspension Take 30 mLs by mouth every 8 (eight) hours as needed (acid relief).   Yes [provider]  Calcium Carbonate-Vitamin D 600-400 MG-UNIT tablet Take 1 tablet by mouth 2 (two) times daily.    Yes [provider]  levothyroxine (SYNTHROID, LEVOTHROID) 75 MCG tablet Take 75 mcg by mouth daily at 6 (six) AM. 02/27/13  Yes [provider]  Lidocaine 4 % PTCH Place 1 patch onto the skin 2 (two) times daily.   Yes [provider]  loperamide (IMODIUM) 2 MG capsule Take 2 mg by mouth every 2 (two) hours as needed for diarrhea or loose stools. 11/08/18  Yes [provider]  loratadine (CLARITIN) 10 MG tablet Take 10 mg by mouth daily at 6 (six) AM.   Yes [provider]  Melatonin 10 MG TABS Take 10 mg by mouth at bedtime.    Yes [provider]  meloxicam (MOBIC) 15 MG tablet Take 15 mg by mouth daily at 6 (six) AM. 10/30/18  Yes [provider]  mirtazapine (REMERON) 15 MG tablet Take 15 mg by mouth at bedtime. 03/03/21  Yes  [provider]  Multiple Vitamin (MULTIVITAMIN WITH MINERALS) TABS tablet Take 1 tablet by mouth daily at 6 (six) AM.   Yes [provider]  omeprazole (PRILOSEC) 20 MG capsule Take 20 mg by mouth 2 (two) times daily. 10/17/18  Yes [provider]  OXYGEN Inhale 2 L into the lungs continuous.   Yes [provider]  polyethylene glycol (MIRALAX / GLYCOLAX) packet Take 17 g by mouth daily as needed (constipation).   Yes [provider]  senna (SENOKOT) 8.6 MG TABS tablet Take 1 tablet by mouth daily at 6 (six) AM.   Yes [provider]  TRAZODONE HCL PO Take 75 mg by mouth at bedtime.   Yes [provider]  venlafaxine XR (EFFEXOR-XR) 75 MG 24 hr capsule Take 75 mg by mouth daily at 6 (six) AM.   Yes [provider]  ALPRAZolam (XANAX) 0.5 MG tablet Take 1 tablet (0.5 mg total) by mouth 2 (two) times daily. 04/03/21   Amin, Loura Halt, MD  amoxicillin-clavulanate (AUGMENTIN) 875-125 MG tablet Take 1 tablet by mouth every 12 (twelve) hours. Patient not taking: Reported on 03/28/2021 03/09/21   Marinda Elk, MD  cephALEXin (KEFLEX) 500 MG capsule Take 1 capsule (500 mg total) by mouth every 6 (six) hours for 2 days. 04/03/21 04/05/21  Amin, Loura Halt, MD  feeding supplement (ENSURE ENLIVE / ENSURE PLUS) LIQD Take 237 mLs by mouth 2 (two) times daily between meals. 04/03/21   Amin, Loura Halt, MD  food thickener (SIMPLYTHICK, NECTAR/LEVEL 2/MILDLY THICK,) GEL Take 1 packet by mouth as needed. 04/03/21   Amin, Loura Halt, MD  hydrocortisone cream 1 % Apply topically 2 (two) times daily. 04/03/21   Amin, Loura Halt, MD  liver oil-zinc oxide (DESITIN) 40 % ointment Apply topically 3 (three) times daily as needed for irritation. 04/03/21  Dimple NanasAmin, Ankit Chirag, MD   Allergies  Allergen Reactions   Penicillins Other (See Comments)    "Allergic," [facility] Did it involve swelling of the face/tongue/throat, SOB, or low BP? Unk Did it  involve sudden or severe rash/hives, skin peeling, or any reaction on the inside of your mouth or nose? Unk Did you need to seek medical attention at a hospital or doctor's office? Unk When did it last happen? Unk If all above answers are "NO", may proceed with cephalosporin use.   Bactrim [Sulfamethoxazole-Trimethoprim] Other (See Comments)    "Allergic," per [facility]   Sulfa Antibiotics Other (See Comments)    "Allergic," per [facility]   Codeine Palpitations and Other (See Comments)    "Allergic," [facility]   Review of Systems  Physical Exam General: Alert, awake, in no acute distress.  Confused. Heart: Regular rate and rhythm. No murmur appreciated. Lungs: Good air movement, clear Abdomen: Soft, nontender, nondistended, positive bowel sounds.   Ext: No significant edema Skin: Warm and dry Neuro: Grossly intact, nonfocal.  Vital Signs: BP 118/67 (BP Location: Left Arm)   Pulse 96   Temp (!) 97.5 F (36.4 C)   Resp 16   Ht 5\' 1"  (1.549 m)   Wt 62.5 kg   SpO2 93%   BMI 26.03 kg/m  Pain Scale: 0-10   Pain Score: 0-No pain   SpO2: SpO2: 93 % O2 Device:SpO2: 93 % O2 Flow Rate: .O2 Flow Rate (L/min): 1 L/min  IO: Intake/output summary:  Intake/Output Summary (Last 24 hours) at 04/05/2021 16100709 Last data filed at 04/04/2021 1550 Gross per 24 hour  Intake 480 ml  Output 500 ml  Net -20 ml    LBM: Last BM Date: 04/03/21 Baseline Weight: Weight: 68 kg Most recent weight: Weight: 62.5 kg     Palliative Assessment/Data:   Flowsheet Rows    Flowsheet Row Most Recent Value  Intake Tab   Referral Department Hospitalist  Unit at Time of Referral Med/Surg Unit  Palliative Care Primary Diagnosis Sepsis/Infectious Disease  Date Notified 04/03/21  Palliative Care Type New Palliative care  Reason for referral Clarify Goals of Care  Date of Admission 03/28/21  Date first seen by Palliative Care 04/04/21  # of days Palliative referral response time 1 Day(s)  # of days IP prior to  Palliative referral 6  Clinical Assessment   Palliative Performance Scale Score 40%  Psychosocial & Spiritual Assessment   Palliative Care Outcomes   Patient/Family meeting held? Yes  Who was at the meeting? Nephew via phone       Time In: 1715 Time Out: 1815 Time Total: 60 Greater than 50%  of this time was spent counseling and coordinating care related to the above assessment and plan.  Signed by: Romie MinusGene Jayshawn Colston, MD   Please contact Palliative Medicine Team phone at [phone] for questions and concerns.  For individual provider: See Loretha StaplerAmion

## 2021-04-05 NOTE — Progress Notes (Signed)
Occupational Therapy Treatment Patient Details Name: Gina Hodges MRN: [id number] DOB: [birth year] Today's Date: 04/05/2021    History of present illness Gina Hodges is a 87 yo female with PMH dementia, HLD, GERD, hypothyroidism, Afib, CVA  who presented with altered mental status 03/28/21.  Patient was  seen in the ED 03/27/21 for a fall from a wheelchair. CTof head  was negative. W/u revealed a UTI. She was started on abx and sent back to her facility.   OT comments  Treatment focused on feeding goal. Patient begging for water when therapist entered the room. Patient provided patient with two honey thick waters. Patient able to predominantly drink water through straw while holding cup. Min assist for straw management (straw not stabilized in short container without a lid). Patient able to herself 6 bites of magic cup with spoon. Patient had a tendency to extend neck when drinking and swallowing. Therapist placed extra pillow behind her head to block neck extension. Improved ability to self feed today. Continue to recommend SNF at discharge.     Follow Up Recommendations  SNF    Equipment Recommendations  None recommended by OT    Recommendations for Other Services      Precautions / Restrictions Precautions Precautions: Fall Precaution Comments: Hx of aspiration pneumonia       Mobility Bed Mobility                    Transfers                      Balance                                           ADL either performed or assessed with clinical judgement   ADL Overall ADL's : Needs assistance/impaired Eating/Feeding: Bed level;Minimal assistance;Supervision/ safety Eating/Feeding Details (indicate cue type and reason): set up to drink two honey thickened waters with straw. min assist to manage straw - verbal cues and tactile cue at back of head to keep patient from extending her neck when drinking. Placed extra pillow behind her head to keep from  extending neck. Patient able to feed self orange magic cup - 6 bites with setup only. Grooming: Therapist, nutritional;Set up;Supervision/safety Grooming Details (indicate cue type and reason): washed face after feeding task.                                     Vision   Vision Assessment?: No apparent visual deficits   Perception     Praxis      Cognition Arousal/Alertness: Awake/alert Behavior During Therapy: WFL for tasks assessed/performed Overall Cognitive Status: Within Functional Limits for tasks assessed                                          Exercises     Shoulder Instructions       General Comments      Pertinent Vitals/ Pain       Pain Assessment: No/denies pain  Home Living  Prior Functioning/Environment              Frequency  Min 2X/week        Progress Toward Goals  OT Goals(current goals can now be found in the care plan section)  Progress towards OT goals: Progressing toward goals  Acute Rehab OT Goals Patient Stated Goal: to get stronger OT Goal Formulation: With patient Time For Goal Achievement: 04/16/21 Potential to Achieve Goals: Fair  Plan Discharge plan remains appropriate    Co-evaluation                 AM-PAC OT "6 Clicks" Daily Activity     Outcome Measure   Help from another person eating meals?: A Little Help from another person taking care of personal grooming?: A Little Help from another person toileting, which includes using toliet, bedpan, or urinal?: Total Help from another person bathing (including washing, rinsing, drying)?: A Lot Help from another person to put on and taking off regular upper body clothing?: A Lot Help from another person to put on and taking off regular lower body clothing?: Total 6 Click Score: 12    End of Session    OT Visit Diagnosis: History of falling (Z91.81);Muscle weakness (generalized)  (M62.81)   Activity Tolerance Patient tolerated treatment well   Patient Left in bed;with call bell/phone within reach;with bed alarm set   Nurse Communication  (informed RN about therapist requesting speech eval from MD.)        Time: 0865-7846 OT Time Calculation (min): 21 min  Charges: OT General Charges $OT Visit: 1 Visit OT Treatments $Self Care/Home Management : 8-22 mins  Waldron Session, OTR/L Acute Care Rehab Services  Office [phone] Pager: [phone]    Kelli Churn 04/05/2021, 3:53 PM

## 2021-04-05 NOTE — Progress Notes (Signed)
PROGRESS NOTE    Gina Hodges  RFF:[id number] DOB: 1927/06/15 DOA: 03/28/2021 PCP: Lottie Dawson   Brief Narrative:  87 yo female with PMH dementia, HLD, GERD, hypothyroidism who presented with altered mental status.  Due to severe hard of hearing and cognitive impairment, she is a difficult historian. She had recently been seen in the ER for a fall from her wheelchair.  CT head was negative at that time and she was started on antibiotics for a presumed UTI and discharged back to her nursing home.  Due to ongoing concern for altered mentation, she was sent back to the ER for further evaluation.  UA was still consistent with infection and she was started on Rocephin. She was also started on fluids for possible dehydration.  PT recommended SNF but insurance declined.  Palliative care team was also consulted given overall poor prognosis and guarded condition.   Assessment & Plan:   Principal Problem:   UTI (urinary tract infection) Active Problems:   Dementia with behavioral problem (HCC)   Pressure injury of skin   Hypokalemia   Prediabetes   Malnutrition of moderate degree   UTI.  Urine culture with pansensitive E. coli.  Completed 5-day course Keflex   AKI.  Resolved.  Baseline creatinine 0.7  Prediabetes.  A1c of 5.9. Diet controlled.    History of hypokalemia.  repleted.    Dementia with behavioral problem [facility]) - Patient has underlying chronic cognitive impairment from this and may have some acute worsening of her confusion and mood in setting of infection. CT head - neg.    Thrombocytopenia (HCC)-resolved as of 03/29/2021   Hyponatremia-resolved as of 03/30/2021  Generalized weakness.  PT/OT= SNF but denied by insurance.    DVT prophylaxis:  SCDs Start: 03/28/21 1619 Code Status: DNR Family Communication: Tamsen Meek has been updated periodically  Status is: Inpatient  Remains inpatient appropriate because:Unsafe d/c plan.  Case management working  on safe disposition planning  Dispo: The patient is from: Home              Anticipated d/c is to: ALF              Patient currently is medically stable to d/c.  Apparently family has appealed her discharge.  Awaiting decision   Difficult to place patient No       Nutritional status  Nutrition Problem: Moderate Malnutrition Etiology: chronic illness (dementia)  Signs/Symptoms: mild fat depletion, mild muscle depletion, moderate muscle depletion  Interventions: Ensure Enlive (each supplement provides 350kcal and 20 grams of protein), Magic cup, MVI  Body mass index is 26.03 kg/m.  Pressure Injury 03/28/21 Heel Left Stage 1 -  Intact skin with non-blanchable redness of a localized area usually over a bony prominence. (Active)  03/28/21 1700  Location: Heel  Location Orientation: Left  Staging: Stage 1 -  Intact skin with non-blanchable redness of a localized area usually over a bony prominence.  Wound Description (Comments):   Present on Admission: Yes    Subjective: Sleeping this morning but easily arousable.  No complaints.  No acute events overnight.  Review of Systems Otherwise negative except as per HPI, including: General: Denies fever, chills, night sweats or unintended weight loss. Resp: Denies cough, wheezing, shortness of breath. Cardiac: Denies chest pain, palpitations, orthopnea, paroxysmal nocturnal dyspnea. GI: Denies abdominal pain, nausea, vomiting, diarrhea or constipation GU: Denies dysuria, frequency, hesitancy or incontinence MS: Denies muscle aches, joint pain or swelling Neuro: Denies headache, neurologic deficits (focal weakness, numbness,  tingling), abnormal gait Psych: Denies anxiety, depression, SI/HI/AVH Skin: Denies new rashes or lesions ID: Denies sick contacts, exotic exposures, travel  Examination:  Constitutional: Not in acute distress, elderly frail Respiratory: Clear to auscultation bilaterally Cardiovascular: Normal sinus rhythm,  no rubs Abdomen: Nontender nondistended good bowel sounds Musculoskeletal: No edema noted Skin: No rashes seen Neurologic: CN 2-12 grossly intact.  And nonfocal Psychiatric: Alert to name and place.  Poor judgment and insight  Objective: Vitals:   04/04/21 0538 04/04/21 1358 04/04/21 2102 04/05/21 0423  BP: (!) 144/92 (!) 107/93 130/67 118/67  Pulse: 81 98 (!) 101 96  Resp: 17 18 18 16   Temp: 98.1 F (36.7 C) 98.3 F (36.8 C) 98.4 F (36.9 C) (!) 97.5 F (36.4 C)  TempSrc: Oral Oral    SpO2: 94% 94% 94% 93%  Weight:      Height:        Intake/Output Summary (Last 24 hours) at 04/05/2021 1047 Last data filed at 04/05/2021 1037 Gross per 24 hour  Intake --  Output 700 ml  Net -700 ml   Filed Weights   03/28/21 1047 03/28/21 1606  Weight: 68 kg 62.5 kg     Data Reviewed:   CBC: Recent Labs  Lab 03/30/21 0347 04/05/21 0341  WBC 7.9 8.0  NEUTROABS 3.2  --   HGB 10.2* 12.3  HCT 32.9* 39.6  MCV 96.2 96.1  PLT 309 291   Basic Metabolic Panel: Recent Labs  Lab 03/30/21 0347 04/05/21 0341  NA 140 141  K 4.7 4.7  CL 107 100  CO2 28 32  GLUCOSE 97 110*  BUN 11 29*  CREATININE 0.72 0.74  CALCIUM 9.0 9.9  MG 1.7 1.9   GFR: Estimated Creatinine Clearance: 38.8 mL/min (by C-G formula based on SCr of 0.74 mg/dL). Liver Function Tests: No results for input(s): AST, ALT, ALKPHOS, BILITOT, PROT, ALBUMIN in the last 168 hours.  No results for input(s): LIPASE, AMYLASE in the last 168 hours. No results for input(s): AMMONIA in the last 168 hours. Coagulation Profile: No results for input(s): INR, PROTIME in the last 168 hours. Cardiac Enzymes: No results for input(s): CKTOTAL, CKMB, CKMBINDEX, TROPONINI in the last 168 hours. BNP (last 3 results) No results for input(s): PROBNP in the last 8760 hours. HbA1C: No results for input(s): HGBA1C in the last 72 hours. CBG: No results for input(s): GLUCAP in the last 168 hours. Lipid Profile: No results for input(s):  CHOL, HDL, LDLCALC, TRIG, CHOLHDL, LDLDIRECT in the last 72 hours. Thyroid Function Tests: No results for input(s): TSH, T4TOTAL, FREET4, T3FREE, THYROIDAB in the last 72 hours. Anemia Panel: No results for input(s): VITAMINB12, FOLATE, FERRITIN, TIBC, IRON, RETICCTPCT in the last 72 hours. Sepsis Labs: No results for input(s): PROCALCITON, LATICACIDVEN in the last 168 hours.   Recent Results (from the past 240 hour(s))  Urine culture     Status: Abnormal   Collection Time: 03/27/21  2:20 PM   Specimen: Urine, Clean Catch  Result Value Ref Range Status   Specimen Description   Final    URINE, CLEAN CATCH Performed at [facility], [address], [place], [place] [address]    Special Requests   Final    NONE Performed at [facility], [address], [place], [place] [address]    Culture >=100,000 COLONIES/mL ESCHERICHIA COLI (A)  Final   Report Status 03/30/2021 FINAL  Final   Organism ID, Bacteria ESCHERICHIA COLI (A)  Final  Susceptibility   Escherichia coli - MIC*    AMPICILLIN <=2 SENSITIVE Sensitive     CEFAZOLIN <=4 SENSITIVE Sensitive     CEFEPIME <=0.12 SENSITIVE Sensitive     CEFTRIAXONE <=0.25 SENSITIVE Sensitive     CIPROFLOXACIN <=0.25 SENSITIVE Sensitive     GENTAMICIN <=1 SENSITIVE Sensitive     IMIPENEM <=0.25 SENSITIVE Sensitive     NITROFURANTOIN <=16 SENSITIVE Sensitive     TRIMETH/SULFA <=20 SENSITIVE Sensitive     AMPICILLIN/SULBACTAM <=2 SENSITIVE Sensitive     PIP/TAZO <=4 SENSITIVE Sensitive     * >=100,000 COLONIES/mL ESCHERICHIA COLI  Resp Panel by RT-PCR (Flu A&B, Covid) Nasopharyngeal Swab     Status: None   Collection Time: 03/28/21 10:48 AM   Specimen: Nasopharyngeal Swab; Nasopharyngeal(NP) swabs in vial transport medium  Result Value Ref Range Status   SARS Coronavirus 2 by RT PCR NEGATIVE NEGATIVE Final    Comment: (NOTE) SARS-CoV-2 target nucleic acids are NOT DETECTED.  The SARS-CoV-2 RNA is  generally detectable in upper respiratory specimens during the acute phase of infection. The lowest concentration of SARS-CoV-2 viral copies this assay can detect is 138 copies/mL. A negative result does not preclude SARS-Cov-2 infection and should not be used as the sole basis for treatment or other patient management decisions. A negative result may occur with  improper specimen collection/handling, submission of specimen other than nasopharyngeal swab, presence of viral mutation(s) within the areas targeted by this assay, and inadequate number of viral copies(<138 copies/mL). A negative result must be combined with clinical observations, patient history, and epidemiological information. The expected result is Negative.  Fact Sheet for Patients:  BloggerCourse.com  Fact Sheet for Healthcare Providers:  SeriousBroker.it  This test is no t yet approved or cleared by the [place] FDA and  has been authorized for detection and/or diagnosis of SARS-CoV-2 by FDA under an Emergency Use Authorization (EUA). This EUA will remain  in effect (meaning this test can be used) for the duration of the COVID-19 declaration under Section 564(b)(1) of the Act, 21 U.S.C.section 360bbb-3(b)(1), unless the authorization is terminated  or revoked sooner.       Influenza A by PCR NEGATIVE NEGATIVE Final   Influenza B by PCR NEGATIVE NEGATIVE Final    Comment: (NOTE) The Xpert Xpress SARS-CoV-2/FLU/RSV plus assay is intended as an aid in the diagnosis of influenza from Nasopharyngeal swab specimens and should not be used as a sole basis for treatment. Nasal washings and aspirates are unacceptable for Xpert Xpress SARS-CoV-2/FLU/RSV testing.  Fact Sheet for Patients: BloggerCourse.com  Fact Sheet for Healthcare Providers: SeriousBroker.it  This test is not yet approved or cleared by the [place] FDA and has been authorized for detection and/or diagnosis of SARS-CoV-2 by FDA under an Emergency Use Authorization (EUA). This EUA will remain in effect (meaning this test can be used) for the duration of the COVID-19 declaration under Section 564(b)(1) of the Act, 21 U.S.C. section 360bbb-3(b)(1), unless the authorization is terminated or revoked.  Performed at [facility], [address] [place], [place] [address]   Culture, blood (Routine X 2) w Reflex to ID Panel     Status: None   Collection Time: 03/28/21 11:20 AM   Specimen: BLOOD  Result Value Ref Range Status   Specimen Description   Final    BLOOD LEFT ARM Performed at [facility], [address], [place], [place] [address]    Special Requests   Final  BOTTLES DRAWN AEROBIC AND ANAEROBIC Blood Culture results may not be optimal due to an inadequate volume of blood received in culture bottles Performed at [facility], [address] [place], [place] [address]    Culture   Final    NO GROWTH 5 DAYS Performed at [facility], [address], [place], [place] [address]    Report Status 04/02/2021 FINAL  Final  Culture, Urine     Status: Abnormal   Collection Time: 03/28/21 12:00 PM   Specimen: Urine, Random  Result Value Ref Range Status   Specimen Description   Final    URINE, RANDOM Performed at [facility], [address], [place], [place] [address]    Special Requests   Final    NONE Performed at [facility], [address], [place], [place] [address]    Culture >=100,000 COLONIES/mL ESCHERICHIA COLI (A)  Final   Report Status 03/31/2021 FINAL  Final   Organism ID, Bacteria ESCHERICHIA COLI (A)  Final      Susceptibility   Escherichia coli - MIC*    AMPICILLIN <=2 SENSITIVE Sensitive     CEFAZOLIN <=4 SENSITIVE Sensitive     CEFEPIME <=0.12 SENSITIVE Sensitive     CEFTRIAXONE <=0.25 SENSITIVE  Sensitive     CIPROFLOXACIN <=0.25 SENSITIVE Sensitive     GENTAMICIN <=1 SENSITIVE Sensitive     IMIPENEM <=0.25 SENSITIVE Sensitive     NITROFURANTOIN <=16 SENSITIVE Sensitive     TRIMETH/SULFA <=20 SENSITIVE Sensitive     AMPICILLIN/SULBACTAM <=2 SENSITIVE Sensitive     PIP/TAZO <=4 SENSITIVE Sensitive     * >=100,000 COLONIES/mL ESCHERICHIA COLI  Culture, blood (Routine X 2) w Reflex to ID Panel     Status: None   Collection Time: 03/28/21  6:27 PM   Specimen: BLOOD  Result Value Ref Range Status   Specimen Description   Final    BLOOD LEFT HAND Performed at [facility], [address], [place], [place] [address]    Special Requests   Final    BOTTLES DRAWN AEROBIC ONLY Blood Culture adequate volume   Culture   Final    NO GROWTH 5 DAYS Performed at [facility], [address], [place], [place] [address]    Report Status 04/03/2021 FINAL  Final  Resp Panel by RT-PCR (Flu A&B, Covid) Nasopharyngeal Swab     Status: None   Collection Time: 04/01/21 10:27 AM   Specimen: Nasopharyngeal Swab; Nasopharyngeal(NP) swabs in vial transport medium  Result Value Ref Range Status   SARS Coronavirus 2 by RT PCR NEGATIVE NEGATIVE Final    Comment: (NOTE) SARS-CoV-2 target nucleic acids are NOT DETECTED.  The SARS-CoV-2 RNA is generally detectable in upper respiratory specimens during the acute phase of infection. The lowest concentration of SARS-CoV-2 viral copies this assay can detect is 138 copies/mL. A negative result does not preclude SARS-Cov-2 infection and should not be used as the sole basis for treatment or other patient management decisions. A negative result may occur with  improper specimen collection/handling, submission of specimen other than nasopharyngeal swab, presence of viral mutation(s) within the areas targeted by this assay, and inadequate number of viral copies(<138 copies/mL). A negative result must be combined with clinical  observations, patient history, and epidemiological information. The expected result is Negative.  Fact Sheet for Patients:  BloggerCourse.com  Fact Sheet for Healthcare Providers:  SeriousBroker.it  This test is no t yet approved or cleared  by the [place] and  has been authorized for detection and/or diagnosis of SARS-CoV-2 by FDA under an Emergency Use Authorization (EUA). This EUA will remain  in effect (meaning this test can be used) for the duration of the COVID-19 declaration under Section 564(b)(1) of the Act, 21 U.S.C.section 360bbb-3(b)(1), unless the authorization is terminated  or revoked sooner.       Influenza A by PCR NEGATIVE NEGATIVE Final   Influenza B by PCR NEGATIVE NEGATIVE Final    Comment: (NOTE) The Xpert Xpress SARS-CoV-2/FLU/RSV plus assay is intended as an aid in the diagnosis of influenza from Nasopharyngeal swab specimens and should not be used as a sole basis for treatment. Nasal washings and aspirates are unacceptable for Xpert Xpress SARS-CoV-2/FLU/RSV testing.  Fact Sheet for Patients: BloggerCourse.com  Fact Sheet for Healthcare Providers: SeriousBroker.it  This test is not yet approved or cleared by the [place] FDA and has been authorized for detection and/or diagnosis of SARS-CoV-2 by FDA under an Emergency Use Authorization (EUA). This EUA will remain in effect (meaning this test can be used) for the duration of the COVID-19 declaration under Section 564(b)(1) of the Act, 21 U.S.C. section 360bbb-3(b)(1), unless the authorization is terminated or revoked.  Performed at [facility], [address] [place], [place] [address]          Radiology Studies: No results found.      Scheduled Meds:  ALPRAZolam  0.5 mg Oral BID   calcium-vitamin D  1 tablet Oral BID   cephALEXin  500 mg Oral Q6H    feeding supplement  237 mL Oral BID BM   hydrocortisone cream   Topical BID   levothyroxine  75 mcg Oral Q0600   loratadine  10 mg Oral Q0600   melatonin  10 mg Oral QHS   mirtazapine  15 mg Oral QHS   multivitamin with minerals  1 tablet Oral Q0600   senna  1 tablet Oral Q0600   traZODone  75 mg Oral QHS   venlafaxine XR  75 mg Oral Q0600   Continuous Infusions:   LOS: 8 days   Time spent= 35 mins    Brevan Luberto Joline Maxcy, MD [facility]  If 7PM-7AM, please contact night-coverage  04/05/2021, 10:47 AM

## 2021-04-06 LAB — CBC
HCT: 38.2 % (ref 36.0–46.0)
Hemoglobin: 11.9 g/dL — ABNORMAL LOW (ref 12.0–15.0)
MCH: 29.5 pg (ref 26.0–34.0)
MCHC: 31.2 g/dL (ref 30.0–36.0)
MCV: 94.6 fL (ref 80.0–100.0)
Platelets: 349 10*3/uL (ref 150–400)
RBC: 4.04 MIL/uL (ref 3.87–5.11)
RDW: 16.3 % — ABNORMAL HIGH (ref 11.5–15.5)
WBC: 8.7 10*3/uL (ref 4.0–10.5)
nRBC: 0 % (ref 0.0–0.2)

## 2021-04-06 LAB — BASIC METABOLIC PANEL
Anion gap: 8 (ref 5–15)
BUN: 30 mg/dL — ABNORMAL HIGH (ref 8–23)
CO2: 31 mmol/L (ref 22–32)
Calcium: 9.9 mg/dL (ref 8.9–10.3)
Chloride: 98 mmol/L (ref 98–111)
Creatinine, Ser: 0.69 mg/dL (ref 0.44–1.00)
GFR, Estimated: >60 mL/min (ref >60)
Glucose, Bld: 104 mg/dL — ABNORMAL HIGH (ref 70–99)
Potassium: 4.1 mmol/L (ref 3.5–5.1)
Sodium: 137 mmol/L (ref 135–145)

## 2021-04-06 LAB — MAGNESIUM: Magnesium: 2 mg/dL (ref 1.7–2.4)

## 2021-04-06 NOTE — Progress Notes (Signed)
End of Shift:  No significant changes or events overnight. Patient resting in bed during shift change. Call bell in reach. Bed low and locked.

## 2021-04-06 NOTE — Progress Notes (Signed)
Daily Progress Note   Patient Name: Gina Hodges       Date: 04/06/2021 DOB: 1927-05-31  Age: 87 y.o. MRN#: [id number] Attending Physician: Dimple Nanas, MD Primary Care Physician: Fransico Michael [facility] Admit Date: 03/28/2021  Reason for Consultation/Follow-up: Establishing goals of care  Subjective: Ms. Neils is awake and alert.  Eating lunch and drank a full Ensure when I was in the room.  Bedside staff reports she has been awake and interactive all day.  Length of Stay: 9  Current Medications: Scheduled Meds:  . ALPRAZolam  0.5 mg Oral BID  . calcium-vitamin D  1 tablet Oral BID  . cephALEXin  500 mg Oral Q6H  . feeding supplement  237 mL Oral BID BM  . hydrocortisone cream   Topical BID  . levothyroxine  75 mcg Oral Q0600  . loratadine  10 mg Oral Q0600  . melatonin  10 mg Oral QHS  . mirtazapine  15 mg Oral QHS  . multivitamin with minerals  1 tablet Oral Q0600  . senna  1 tablet Oral Q0600  . traZODone  75 mg Oral QHS  . venlafaxine XR  75 mg Oral Q0600    Continuous Infusions:   PRN Meds: acetaminophen **OR** acetaminophen, food thickener, ipratropium-albuterol, liver oil-zinc oxide, ondansetron **OR** ondansetron (ZOFRAN) IV  Physical Exam         General: Alert, awake, in no acute distress.  Confused. Heart: Regular rate and rhythm. No murmur appreciated. Lungs: Good air movement, clear Abdomen: Soft, nontender, nondistended, positive bowel sounds.   Ext: No significant edema Skin: Warm and dry Neuro: Grossly intact, nonfocal.  Vital Signs: BP 115/85 (BP Location: Left Arm)   Pulse (!) 106   Temp 98.5 F (36.9 C) (Oral)   Resp 15   Ht 5\' 1"  (1.549 m)   Wt 62.5 kg   SpO2 97%   BMI 26.03 kg/m  SpO2: SpO2: 97 % O2 Device: O2 Device: Room  Air O2 Flow Rate: O2 Flow Rate (L/min): 1 L/min  Intake/output summary:  Intake/Output Summary (Last 24 hours) at 04/06/2021 2051 Last data filed at 04/06/2021 1705 Gross per 24 hour  Intake 840 ml  Output 550 ml  Net 290 ml   LBM: Last BM Date: 04/06/21 Baseline Weight: Weight: 68 kg Most recent weight: Weight: 62.5 kg  Palliative Assessment/Data:    Flowsheet Rows    Flowsheet Row Most Recent Value  Intake Tab   Referral Department Hospitalist  Unit at Time of Referral Med/Surg Unit  Palliative Care Primary Diagnosis Sepsis/Infectious Disease  Date Notified 04/03/21  Palliative Care Type New Palliative care  Reason for referral Clarify Goals of Care  Date of Admission 03/28/21  Date first seen by Palliative Care 04/04/21  # of days Palliative referral response time 1 Day(s)  # of days IP prior to Palliative referral 6  Clinical Assessment   Palliative Performance Scale Score 40%  Psychosocial & Spiritual Assessment   Palliative Care Outcomes   Patient/Family meeting held? Yes  Who was at the meeting? Nephew via phone       Patient Active Problem List   Diagnosis Date Noted  . Malnutrition of moderate degree 03/31/2021  . Pressure injury of skin 03/29/2021  . Hypokalemia 03/29/2021  . Prediabetes 03/29/2021  . Multifocal pneumonia 03/06/2021  . Sepsis (HCC) 11/10/2019  . Fall 08/13/2019  . SIRS (systemic inflammatory response syndrome) (HCC)   . Ingrown nail 11/12/2013  . Compression fracture of spine (HCC) 11/07/2013  . UTI (urinary tract infection) 11/07/2013  . Hypothyroidism 11/07/2013  . Hyperlipidemia   . Dementia with behavioral problem (HCC)   . GERD (gastroesophageal reflux disease)   . PAF (paroxysmal atrial fibrillation) (HCC)   . Generalized anxiety disorder 04/05/2013    Palliative Care Assessment & Plan   Patient Profile: 87 y.o. female  with past medical history of dementia, hyperlipidemia, GERD, hypothyroidism admitted on 03/28/2021  with altered mental status and found to have UTI.  She was recently seen in the ED from a fall from her wheelchair and she was started on antibiotics and sent back to assisted living.  Due to concern for continued altered mental status, she was sent back to the ED for further evaluation.  She had been treated for UTI with Rocephin and fluids.  Recommendation was for PT but this was denied by insurance.  Palliative consulted for goals of care.  Recommendations/Plan: While [name] is elderly and has progressive dementia, her current FAST Score is less than a 7 and she would not qualify for hospice with primary diagnosis of dementia.   Recommend outpatient palliative care to follow her when she is discharged as a can help navigate when she would be eligible to consider hospice benefits in the future. No further palliative specific recommendations at this point.  Discussed with TOC who is exploring options for discharge.  Palliative will not continue to follow at this point.  Please call or reconsult if we can be of further assistance in Ms. Ebel's care moving forward.  Code Status:    Code Status Orders  (From admission, onward)           Start     Ordered   03/28/21 1703  Do not attempt resuscitation (DNR)  Continuous       Question Answer Comment  In the event of cardiac or respiratory ARREST Do not call a "code blue"   In the event of cardiac or respiratory ARREST Do not perform Intubation, CPR, defibrillation or ACLS   In the event of cardiac or respiratory ARREST Use medication by any route, position, wound care, and other measures to relive pain and suffering. May use oxygen, suction and manual treatment of airway obstruction as needed for comfort.      03/28/21 1702  Code Status History     Date Active Date Inactive Code Status Order ID Comments User Context   03/28/2021 1622 03/28/2021 1702 Full Code [id number]  Teddy Spike, DO Inpatient   03/06/2021 0959 03/10/2021  0028 DNR [id number]  Maretta Bees, MD ED   03/06/2021 0402 03/06/2021 0958 Full Code [id number]  Darlin Drop, DO ED   03/06/2021 0334 03/06/2021 0402 DNR [id number]  Darlin Drop, DO ED   11/10/2019 1830 11/14/2019 0308 DNR [id number]  Jackie Plum, MD Inpatient   08/13/2019 2110 08/15/2019 0023 DNR [id number]  Kirt Boys, MD ED   08/13/2019 2110 08/13/2019 2110 DNR [id number]  Angelita Ingles, MD ED   08/13/2019 2007 08/13/2019 2110 Full Code [id number]  Angelita Ingles, MD ED   11/17/2018 0041 11/19/2018 1828 Full Code [id number]  Rometta Emery, MD Inpatient   02/19/2016 1606 02/24/2016 1855 DNR [id number]  Raliegh Ip, DO Inpatient        Discharge Planning: Long term placement with palliative care to follow  Care plan was discussed with RN, TOC, Dr. Nelson Chimes  Thank you for allowing the Palliative Medicine Team to assist in the care of this patient.   Total Time 20 Prolonged Time Billed No      Greater than 50%  of this time was spent counseling and coordinating care related to the above assessment and plan.  Romie Minus, MD  Please contact Palliative Medicine Team phone at [phone] for questions and concerns.

## 2021-04-06 NOTE — Progress Notes (Signed)
Physical Therapy Treatment Patient Details Name: Gina Hodges MRN: [id number] DOB: Nov 05, 1926 Today's Date: 04/06/2021    History of Present Illness 87 yo female with PMH dementia, HLD, GERD, hypothyroidism who presented with altered mental status.  Due to severe hard of hearing and cognitive impairment, she is a difficult historian.  She had recently been seen in the ER for a fall from her wheelchair.  CT head was negative at that time and she was started on antibiotics for a presumed UTI and discharged back to her nursing home.  Due to ongoing concern for altered mentation, she was sent back to the ER for further evaluation.  UA was still consistent with infection and she was started on Rocephin. She was also started on fluids for possible dehydration.  PT recommended SNF but insurance declined.  Palliative care team was also consulted given overall poor prognosis and guarded condition.  TOC team working on placement for her    PT Comments    General Comments: AxO x 1 difficult to arouse and quick to refuse activity.  Assisted OOB to [facility] to attempt stimulation.  General bed mobility comments: pt did not initate any self movement despuite repeat VC's.  Required Total Assist and use of bed pad to complete supine to EOB.  Poor collapsed posture and only slight improvemnt in cognition with activity.General transfer comment: pt did not offer any assist with sit to stand to transfer to [facility].  Performed "Bear Hug" 1/4 pivot pt <10%.  Pt sat on BSC with oillow support on back x 12 min and did have a BM (unknown to her).  Called for second assist off BSC but no staff available.  Performed peri care while pt was seated then same fashion "Bear Hug" 1/4 pivot back to bed.  Pt 0% due to weakness/fatigue having sat on BSC almost > 15 min.  Pt unable to support self in standing.  Profoundly weak. Pt unable to hold her drink.  Required hand over hand assist to take a few sips of her Honey Thicken juice. Positioned to  comfort in bed sidelying.    Follow Up Recommendations  SNF     Equipment Recommendations  None recommended by PT    Recommendations for Other Services       Precautions / Restrictions Precautions Precautions: Fall Precaution Comments: Hx of aspiration pneumonia and Dementia Restrictions Other Position/Activity Restrictions: Honey Thick    Mobility  Bed Mobility Overal bed mobility: Needs Assistance Bed Mobility: Supine to Sit;Sit to Supine     Supine to sit: Total assist;HOB elevated Sit to supine: Total assist   General bed mobility comments: pt did not initate any self movement despuite repeat VC's.  Required Total Assist and use of bed pad to complete supine to EOB.  Poor collapsed posture and only slight improvemnt in cognition with activity.    Transfers Overall transfer level: Needs assistance Equipment used: None Transfers: Stand Pivot Transfers           General transfer comment: pt did not offer any assist with sit to stand to transfer to [facility].  Performed "Bear Hug" 1/4 pivot pt <10%.  Pt sat on BSC with oillow support on back x 12 min and did have a BM (unknown to her).  Called for second assist off BSC but no staff available.  Performed peri care while pt was seated then same fashion "Bear Hug" 1/4 pivot back to bed.  Pt 0% due to weakness/fatigue having sat on BSC almost >  15 min.  Pt unable to support self in standing.  Profoundly weak.  Ambulation/Gait                 Stairs             Wheelchair Mobility    Modified Rankin (Stroke Patients Only)       Balance                                            Cognition Arousal/Alertness: Lethargic                                     General Comments: AxO x 1 difficult to arouse and quick to refuse activity      Exercises      General Comments        Pertinent Vitals/Pain      Home Living                      Prior Function             PT Goals (current goals can now be found in the care plan section) Progress towards PT goals: Not progressing toward goals - comment    Frequency    Min 2X/week      PT Plan Current plan remains appropriate    Co-evaluation              AM-PAC PT "6 Clicks" Mobility   Outcome Measure  Help needed turning from your back to your side while in a flat bed without using bedrails?: Total Help needed moving from lying on your back to sitting on the side of a flat bed without using bedrails?: Total Help needed moving to and from a bed to a chair (including a wheelchair)?: Total Help needed standing up from a chair using your arms (e.g., wheelchair or bedside chair)?: Total Help needed to walk in hospital room?: Total Help needed climbing 3-5 steps with a railing? : Total 6 Click Score: 6    End of Session Equipment Utilized During Treatment: Gait belt Activity Tolerance: Patient limited by fatigue;Other (comment) (limited by cognition/awareness) Patient left: in bed;with call bell/phone within reach;with bed alarm set Nurse Communication: Mobility status PT Visit Diagnosis: Unsteadiness on feet (R26.81);Other abnormalities of gait and mobility (R26.89);Muscle weakness (generalized) (M62.81)     Time: 7262-0355 PT Time Calculation (min) (ACUTE ONLY): 25 min  Charges:  $Therapeutic Activity: 23-37 mins                     Felecia Shelling  PTA Acute  Rehabilitation Services Pager      [phone] Office      [phone]

## 2021-04-06 NOTE — Progress Notes (Signed)
PROGRESS NOTE    Sharnice Bosler  GEX:[id number] DOB: [birth year] DOA: 03/28/2021 PCP: Lottie Dawson   Brief Narrative:  87 yo female with PMH dementia, HLD, GERD, hypothyroidism who presented with altered mental status.  Due to severe hard of hearing and cognitive impairment, she is a difficult historian. She had recently been seen in the ER for a fall from her wheelchair.  CT head was negative at that time and she was started on antibiotics for a presumed UTI and discharged back to her nursing home.  Due to ongoing concern for altered mentation, she was sent back to the ER for further evaluation.  UA was still consistent with infection and she was started on Rocephin. She was also started on fluids for possible dehydration.  PT recommended SNF but insurance declined.  Palliative care team was also consulted given overall poor prognosis and guarded condition.  TOC team working on placement for her   Assessment & Plan:   Principal Problem:   UTI (urinary tract infection) Active Problems:   Dementia with behavioral problem (HCC)   Pressure injury of skin   Hypokalemia   Prediabetes   Malnutrition of moderate degree   UTI.  Urine culture with pansensitive E. coli.  Completed 5 days of Keflex   AKI.  Resolved.  Baseline creatinine 0.7  Prediabetes.  A1c of 5.9. Diet controlled.    History of hypokalemia.  repleted.    Dementia with behavioral problem [facility]) - Patient has underlying chronic cognitive impairment from this and may have some acute worsening of her confusion and mood in setting of infection. CT head - neg.    Thrombocytopenia (HCC)-resolved as of 03/29/2021   Hyponatremia-resolved as of 03/30/2021  Generalized weakness.  PT/OT= SNF but denied by insurance.    DVT prophylaxis:  SCDs Start: 03/28/21 1619 Code Status: DNR Family Communication: Nephew has been updated  Status is: Inpatient  Remains inpatient appropriate because:Unsafe d/c plan.  Case  management working on safe disposition planning  Dispo: The patient is from: Home              Anticipated d/c is to: ALF              Patient currently is medically stable to d/c.  Apparently family has appealed her discharge.  Awaiting decision   Difficult to place patient No       Nutritional status  Nutrition Problem: Moderate Malnutrition Etiology: chronic illness (dementia)  Signs/Symptoms: mild fat depletion, mild muscle depletion, moderate muscle depletion  Interventions: Ensure Enlive (each supplement provides 350kcal and 20 grams of protein), Magic cup, MVI  Body mass index is 26.03 kg/m.  Pressure Injury 03/28/21 Heel Left Stage 1 -  Intact skin with non-blanchable redness of a localized area usually over a bony prominence. (Active)  03/28/21 1700  Location: Heel  Location Orientation: Left  Staging: Stage 1 -  Intact skin with non-blanchable redness of a localized area usually over a bony prominence.  Wound Description (Comments):   Present on Admission: Yes    Subjective: Sitting up at bedside commode, no complaints feels okay  Review of Systems Otherwise negative except as per HPI, including: General: Denies fever, chills, night sweats or unintended weight loss. Resp: Denies cough, wheezing, shortness of breath. Cardiac: Denies chest pain, palpitations, orthopnea, paroxysmal nocturnal dyspnea. GI: Denies abdominal pain, nausea, vomiting, diarrhea or constipation GU: Denies dysuria, frequency, hesitancy or incontinence MS: Denies muscle aches, joint pain or swelling Neuro: Denies headache, neurologic deficits (focal  weakness, numbness, tingling), abnormal gait Psych: Denies anxiety, depression, SI/HI/AVH Skin: Denies new rashes or lesions ID: Denies sick contacts, exotic exposures, travel Examination:  Constitutional: Not in acute distress, elderly frail Respiratory: Clear to auscultation bilaterally Cardiovascular: Normal sinus rhythm, no  rubs Abdomen: Nontender nondistended good bowel sounds Musculoskeletal: No edema noted Skin: No rashes seen Neurologic: CN 2-12 grossly intact.  And nonfocal Psychiatric: Normal judgment and insight. Alert and oriented x 3. Normal mood.  Objective: Vitals:   04/05/21 1414 04/05/21 2048 04/05/21 2048 04/06/21 0425  BP: 120/84 120/78 120/78 107/76  Pulse: 91 88 87 94  Resp: 16 16 16 14   Temp: 97.8 F (36.6 C) 98 F (36.7 C) 98 F (36.7 C) (!) 97.5 F (36.4 C)  TempSrc: Oral Oral Oral Oral  SpO2: 98% 95% 95% 95%  Weight:      Height:        Intake/Output Summary (Last 24 hours) at 04/06/2021 1136 Last data filed at 04/06/2021 0300 Gross per 24 hour  Intake --  Output 350 ml  Net -350 ml   Filed Weights   03/28/21 1047 03/28/21 1606  Weight: 68 kg 62.5 kg     Data Reviewed:   CBC: Recent Labs  Lab 04/05/21 0341 04/06/21 0417  WBC 8.0 8.7  HGB 12.3 11.9*  HCT 39.6 38.2  MCV 96.1 94.6  PLT 291 349   Basic Metabolic Panel: Recent Labs  Lab 04/05/21 0341 04/06/21 0417  NA 141 137  K 4.7 4.1  CL 100 98  CO2 32 31  GLUCOSE 110* 104*  BUN 29* 30*  CREATININE 0.74 0.69  CALCIUM 9.9 9.9  MG 1.9 2.0   GFR: Estimated Creatinine Clearance: 38.8 mL/min (by C-G formula based on SCr of 0.69 mg/dL). Liver Function Tests: No results for input(s): AST, ALT, ALKPHOS, BILITOT, PROT, ALBUMIN in the last 168 hours.  No results for input(s): LIPASE, AMYLASE in the last 168 hours. No results for input(s): AMMONIA in the last 168 hours. Coagulation Profile: No results for input(s): INR, PROTIME in the last 168 hours. Cardiac Enzymes: No results for input(s): CKTOTAL, CKMB, CKMBINDEX, TROPONINI in the last 168 hours. BNP (last 3 results) No results for input(s): PROBNP in the last 8760 hours. HbA1C: No results for input(s): HGBA1C in the last 72 hours. CBG: No results for input(s): GLUCAP in the last 168 hours. Lipid Profile: No results for input(s): CHOL, HDL,  LDLCALC, TRIG, CHOLHDL, LDLDIRECT in the last 72 hours. Thyroid Function Tests: No results for input(s): TSH, T4TOTAL, FREET4, T3FREE, THYROIDAB in the last 72 hours. Anemia Panel: No results for input(s): VITAMINB12, FOLATE, FERRITIN, TIBC, IRON, RETICCTPCT in the last 72 hours. Sepsis Labs: No results for input(s): PROCALCITON, LATICACIDVEN in the last 168 hours.   Recent Results (from the past 240 hour(s))  Urine culture     Status: Abnormal   Collection Time: 03/27/21  2:20 PM   Specimen: Urine, Clean Catch  Result Value Ref Range Status   Specimen Description   Final    URINE, CLEAN CATCH Performed at [facility], [address] [place], [place]    Special Requests   Final    NONE Performed at [facility], [address] [place], [place]    Culture >=100,000 COLONIES/mL ESCHERICHIA COLI (A)  Final   Report Status 03/30/2021 FINAL  Final   Organism ID, Bacteria ESCHERICHIA COLI (A)  Final      Susceptibility   Escherichia coli - MIC*  AMPICILLIN <=2 SENSITIVE Sensitive     CEFAZOLIN <=4 SENSITIVE Sensitive     CEFEPIME <=0.12 SENSITIVE Sensitive     CEFTRIAXONE <=0.25 SENSITIVE Sensitive     CIPROFLOXACIN <=0.25 SENSITIVE Sensitive     GENTAMICIN <=1 SENSITIVE Sensitive     IMIPENEM <=0.25 SENSITIVE Sensitive     NITROFURANTOIN <=16 SENSITIVE Sensitive     TRIMETH/SULFA <=20 SENSITIVE Sensitive     AMPICILLIN/SULBACTAM <=2 SENSITIVE Sensitive     PIP/TAZO <=4 SENSITIVE Sensitive     * >=100,000 COLONIES/mL ESCHERICHIA COLI  Resp Panel by RT-PCR (Flu A&B, Covid) Nasopharyngeal Swab     Status: None   Collection Time: 03/28/21 10:48 AM   Specimen: Nasopharyngeal Swab; Nasopharyngeal(NP) swabs in vial transport medium  Result Value Ref Range Status   SARS Coronavirus 2 by RT PCR NEGATIVE NEGATIVE Final    Comment: (NOTE) SARS-CoV-2 target nucleic acids are NOT DETECTED.  The SARS-CoV-2 RNA is generally  detectable in upper respiratory specimens during the acute phase of infection. The lowest concentration of SARS-CoV-2 viral copies this assay can detect is 138 copies/mL. A negative result does not preclude SARS-Cov-2 infection and should not be used as the sole basis for treatment or other patient management decisions. A negative result may occur with  improper specimen collection/handling, submission of specimen other than nasopharyngeal swab, presence of viral mutation(s) within the areas targeted by this assay, and inadequate number of viral copies(<138 copies/mL). A negative result must be combined with clinical observations, patient history, and epidemiological information. The expected result is Negative.  Fact Sheet for Patients:  BloggerCourse.com  Fact Sheet for Healthcare Providers:  SeriousBroker.it  This test is no t yet approved or cleared by the [place] FDA and  has been authorized for detection and/or diagnosis of SARS-CoV-2 by FDA under an Emergency Use Authorization (EUA). This EUA will remain  in effect (meaning this test can be used) for the duration of the COVID-19 declaration under Section 564(b)(1) of the Act, 21 U.S.C.section 360bbb-3(b)(1), unless the authorization is terminated  or revoked sooner.       Influenza A by PCR NEGATIVE NEGATIVE Final   Influenza B by PCR NEGATIVE NEGATIVE Final    Comment: (NOTE) The Xpert Xpress SARS-CoV-2/FLU/RSV plus assay is intended as an aid in the diagnosis of influenza from Nasopharyngeal swab specimens and should not be used as a sole basis for treatment. Nasal washings and aspirates are unacceptable for Xpert Xpress SARS-CoV-2/FLU/RSV testing.  Fact Sheet for Patients: BloggerCourse.com  Fact Sheet for Healthcare Providers: SeriousBroker.it  This test is not yet approved or cleared by the [place] FDA  and has been authorized for detection and/or diagnosis of SARS-CoV-2 by FDA under an Emergency Use Authorization (EUA). This EUA will remain in effect (meaning this test can be used) for the duration of the COVID-19 declaration under Section 564(b)(1) of the Act, 21 U.S.C. section 360bbb-3(b)(1), unless the authorization is terminated or revoked.  Performed at [facility], [address], [place], [place] [address]   Culture, blood (Routine X 2) w Reflex to ID Panel     Status: None   Collection Time: 03/28/21 11:20 AM   Specimen: BLOOD  Result Value Ref Range Status   Specimen Description   Final    BLOOD LEFT ARM Performed at [facility], [address], [place], [place] [address]    Special Requests   Final    BOTTLES DRAWN AEROBIC AND ANAEROBIC Blood Culture results may not  be optimal due to an inadequate volume of blood received in culture bottles Performed at [facility], [address] [place], [place] [address]    Culture   Final    NO GROWTH 5 DAYS Performed at [facility], [address] [place], [place] [address]    Report Status 04/02/2021 FINAL  Final  Culture, Urine     Status: Abnormal   Collection Time: 03/28/21 12:00 PM   Specimen: Urine, Random  Result Value Ref Range Status   Specimen Description   Final    URINE, RANDOM Performed at [facility], [address], [place], [place] [address]    Special Requests   Final    NONE Performed at [facility], [address] [place], [place] [address]    Culture >=100,000 COLONIES/mL ESCHERICHIA COLI (A)  Final   Report Status 03/31/2021 FINAL  Final   Organism ID, Bacteria ESCHERICHIA COLI (A)  Final      Susceptibility   Escherichia coli - MIC*    AMPICILLIN <=2 SENSITIVE Sensitive     CEFAZOLIN <=4 SENSITIVE Sensitive     CEFEPIME <=0.12 SENSITIVE Sensitive     CEFTRIAXONE <=0.25 SENSITIVE Sensitive      CIPROFLOXACIN <=0.25 SENSITIVE Sensitive     GENTAMICIN <=1 SENSITIVE Sensitive     IMIPENEM <=0.25 SENSITIVE Sensitive     NITROFURANTOIN <=16 SENSITIVE Sensitive     TRIMETH/SULFA <=20 SENSITIVE Sensitive     AMPICILLIN/SULBACTAM <=2 SENSITIVE Sensitive     PIP/TAZO <=4 SENSITIVE Sensitive     * >=100,000 COLONIES/mL ESCHERICHIA COLI  Culture, blood (Routine X 2) w Reflex to ID Panel     Status: None   Collection Time: 03/28/21  6:27 PM   Specimen: BLOOD  Result Value Ref Range Status   Specimen Description   Final    BLOOD LEFT HAND Performed at [facility], [address], [place], [place] [address]    Special Requests   Final    BOTTLES DRAWN AEROBIC ONLY Blood Culture adequate volume   Culture   Final    NO GROWTH 5 DAYS Performed at [facility], [address], [place], [place] [address]    Report Status 04/03/2021 FINAL  Final  Resp Panel by RT-PCR (Flu A&B, Covid) Nasopharyngeal Swab     Status: None   Collection Time: 04/01/21 10:27 AM   Specimen: Nasopharyngeal Swab; Nasopharyngeal(NP) swabs in vial transport medium  Result Value Ref Range Status   SARS Coronavirus 2 by RT PCR NEGATIVE NEGATIVE Final    Comment: (NOTE) SARS-CoV-2 target nucleic acids are NOT DETECTED.  The SARS-CoV-2 RNA is generally detectable in upper respiratory specimens during the acute phase of infection. The lowest concentration of SARS-CoV-2 viral copies this assay can detect is 138 copies/mL. A negative result does not preclude SARS-Cov-2 infection and should not be used as the sole basis for treatment or other patient management decisions. A negative result may occur with  improper specimen collection/handling, submission of specimen other than nasopharyngeal swab, presence of viral mutation(s) within the areas targeted by this assay, and inadequate number of viral copies(<138 copies/mL). A negative result must be combined with clinical observations, patient  history, and epidemiological information. The expected result is Negative.  Fact Sheet for Patients:  BloggerCourse.com  Fact Sheet for Healthcare Providers:  SeriousBroker.it  This test is no t yet approved or cleared by the [place] and  has been authorized  for detection and/or diagnosis of SARS-CoV-2 by FDA under an Emergency Use Authorization (EUA). This EUA will remain  in effect (meaning this test can be used) for the duration of the COVID-19 declaration under Section 564(b)(1) of the Act, 21 U.S.C.section 360bbb-3(b)(1), unless the authorization is terminated  or revoked sooner.       Influenza A by PCR NEGATIVE NEGATIVE Final   Influenza B by PCR NEGATIVE NEGATIVE Final    Comment: (NOTE) The Xpert Xpress SARS-CoV-2/FLU/RSV plus assay is intended as an aid in the diagnosis of influenza from Nasopharyngeal swab specimens and should not be used as a sole basis for treatment. Nasal washings and aspirates are unacceptable for Xpert Xpress SARS-CoV-2/FLU/RSV testing.  Fact Sheet for Patients: BloggerCourse.comhttps://www.fda.gov/media/152166/download  Fact Sheet for Healthcare Providers: SeriousBroker.ithttps://www.fda.gov/media/152162/download  This test is not yet approved or cleared by the [place] FDA and has been authorized for detection and/or diagnosis of SARS-CoV-2 by FDA under an Emergency Use Authorization (EUA). This EUA will remain in effect (meaning this test can be used) for the duration of the COVID-19 declaration under Section 564(b)(1) of the Act, 21 U.S.C. section 360bbb-3(b)(1), unless the authorization is terminated or revoked.  Performed at [facility], [address] [place], [place] [address]          Radiology Studies: No results found.      Scheduled Meds:  ALPRAZolam  0.5 mg Oral BID   calcium-vitamin D  1 tablet Oral BID   cephALEXin  500 mg Oral Q6H   feeding supplement  237  mL Oral BID BM   hydrocortisone cream   Topical BID   levothyroxine  75 mcg Oral Q0600   loratadine  10 mg Oral Q0600   melatonin  10 mg Oral QHS   mirtazapine  15 mg Oral QHS   multivitamin with minerals  1 tablet Oral Q0600   senna  1 tablet Oral Q0600   traZODone  75 mg Oral QHS   venlafaxine XR  75 mg Oral Q0600   Continuous Infusions:   LOS: 9 days   Time spent= 35 mins    Cordie Buening Joline Maxcyhirag Raisa Ditto, MD [facility]  If 7PM-7AM, please contact night-coverage  04/06/2021, 11:36 AM

## 2021-04-07 LAB — BASIC METABOLIC PANEL
Anion gap: 9 (ref 5–15)
BUN: 31 mg/dL — ABNORMAL HIGH (ref 8–23)
CO2: 27 mmol/L (ref 22–32)
Calcium: 9.4 mg/dL (ref 8.9–10.3)
Chloride: 101 mmol/L (ref 98–111)
Creatinine, Ser: 0.76 mg/dL (ref 0.44–1.00)
GFR, Estimated: >60 mL/min (ref >60)
Glucose, Bld: 101 mg/dL — ABNORMAL HIGH (ref 70–99)
Potassium: 4.4 mmol/L (ref 3.5–5.1)
Sodium: 137 mmol/L (ref 135–145)

## 2021-04-07 LAB — CBC
HCT: 35.3 % — ABNORMAL LOW (ref 36.0–46.0)
Hemoglobin: 10.7 g/dL — ABNORMAL LOW (ref 12.0–15.0)
MCH: 28.9 pg (ref 26.0–34.0)
MCHC: 30.3 g/dL (ref 30.0–36.0)
MCV: 95.4 fL (ref 80.0–100.0)
Platelets: 328 10*3/uL (ref 150–400)
RBC: 3.7 MIL/uL — ABNORMAL LOW (ref 3.87–5.11)
RDW: 16.3 % — ABNORMAL HIGH (ref 11.5–15.5)
WBC: 8.9 10*3/uL (ref 4.0–10.5)
nRBC: 0 % (ref 0.0–0.2)

## 2021-04-07 LAB — MAGNESIUM: Magnesium: 2.1 mg/dL (ref 1.7–2.4)

## 2021-04-07 NOTE — Progress Notes (Signed)
Occupational Therapy Treatment Patient Details Name: Gina Hodges MRN: [id number] DOB: Jul 16, 1927 Today's Date: 04/07/2021    History of present illness 87 yo female with PMH dementia, HLD, GERD, hypothyroidism who presented with altered mental status.  Due to severe hard of hearing and cognitive impairment, she is a difficult historian.  She had recently been seen in the ER for a fall from her wheelchair.  CT head was negative at that time and she was started on antibiotics for a presumed UTI and discharged back to her nursing home.  Due to ongoing concern for altered mentation, she was sent back to the ER for further evaluation.  UA was still consistent with infection and she was started on Rocephin. She was also started on fluids for possible dehydration.  PT recommended SNF but insurance declined.  Palliative care team was also consulted given overall poor prognosis and guarded condition.  TOC team working on placement for her   OT comments  Patient alert in bed upon arrival, trying to drink thickened coffee. Patient attempts to assist with upper extremities but needing max A x2 to transition to sitting upright edge of bed. With max tactile cues patient able to maintain static sitting with arm support while OT feed patient thickened coffee with spoon. Patient fatigues after ~ 3-4 minutes sitting needing increased assist due to posterior lean and repeated cues for maintaining hand placement to assist with sitting balance. Attempt sit to stand with total assist x2 however patient unable to support her weight with legs sliding therefore returned to edge of bed and total A x2 to return to supine. With cup modified with larger opening patient able to drink the rest of thickened coffee at set up level. Continue to recommend SNF at D/C as patient requiring significant assistance for all self care tasks.    Follow Up Recommendations  SNF    Equipment Recommendations  None recommended by OT        Precautions / Restrictions Precautions Precautions: Fall Precaution Comments: Hx of aspiration pneumonia and Dementia       Mobility Bed Mobility Overal bed mobility: Needs Assistance Bed Mobility: Supine to Sit;Sit to Supine     Supine to sit: Max assist;+2 for physical assistance;+2 for safety/equipment;HOB elevated Sit to supine: Total assist;+2 for physical assistance   General bed mobility comments: patient does initiate using arms to assist with sitting up at edge of bed however needing max A for trunk support and manage legs out of bed. total A to lay back into bed    Transfers Overall transfer level: Needs assistance Equipment used: 2 person hand held assist Transfers: Sit to/from Stand Sit to Stand: Total assist;+2 physical assistance         General transfer comment: unable to support weight in lower extremities, use of bed pad and two assist to boost up to standing position however unable to maintain with posterior bias    Balance Overall balance assessment: Needs assistance Sitting-balance support: Feet supported;Bilateral upper extremity supported Sitting balance-Leahy Scale: Zero Sitting balance - Comments: initially patient demonstrating poor balance with max cues for hand placement to promote upright posture however with fatigue posterior leans needing up to mod assistance   Standing balance support: Bilateral upper extremity supported Standing balance-Leahy Scale: Zero Standing balance comment: posterior lean, unable to support weight                           ADL either performed or  assessed with clinical judgement   ADL Overall ADL's : Needs assistance/impaired Eating/Feeding: Set up;Sitting;Bed level;Total assistance Eating/Feeding Details (indicate cue type and reason): patient trying to drink from cup upon arrival. seated edge of bed patient heavily reliant on upper extremities and intermittent support for sitting balance therefore total  A to spoon feed coffee. once returned to supine modified cup to have larger opening and patient able to drink remainder of coffee at set up level Grooming: Wash/dry face;Set up;Bed level                   Toilet Transfer: Total assistance;+2 for physical assistance Toilet Transfer Details (indicate cue type and reason): attempted sit to stand from edge of bed, patient unable to support weight with legs sliding forward despite therapy blocking therefore returned to sitting                  Cognition Arousal/Alertness: Awake/alert Behavior During Therapy: Flat affect Overall Cognitive Status: History of cognitive impairments - at baseline                                 General Comments: patient has history of dementia, also very HOH however following directions when able to hear                   Pertinent Vitals/ Pain       Pain Assessment: Faces Faces Pain Scale: No hurt         Frequency  Min 2X/week        Progress Toward Goals  OT Goals(current goals can now be found in the care plan section)  Progress towards OT goals: Not progressing toward goals - comment (weakness, poor activity tolerance)  Acute Rehab OT Goals Patient Stated Goal: to get stronger OT Goal Formulation: With patient Time For Goal Achievement: 04/16/21 Potential to Achieve Goals: Fair ADL Goals Additional ADL Goal #1: Patient will transfer to side of bed in preparation for functional task with mod assist. Additional ADL Goal #2: Patient will sit edge of bed without loss of balance during ADL task. Additional ADL Goal #3: Patient will transfer to [place] with mod assist. Additional ADL Goal #4: Patient will feed self 10 bites of food without physical assistance.  Plan Discharge plan remains appropriate       AM-PAC OT "6 Clicks" Daily Activity     Outcome Measure   Help from another person eating meals?: A Little Help from another person taking care of personal  grooming?: A Lot Help from another person toileting, which includes using toliet, bedpan, or urinal?: Total Help from another person bathing (including washing, rinsing, drying)?: A Lot Help from another person to put on and taking off regular upper body clothing?: A Lot Help from another person to put on and taking off regular lower body clothing?: Total 6 Click Score: 11    End of Session    OT Visit Diagnosis: History of falling (Z91.81);Muscle weakness (generalized) (M62.81)   Activity Tolerance Patient limited by fatigue   Patient Left in bed;with call bell/phone within reach;with bed alarm set   Nurse Communication Mobility status;Other (comment) (needing peri care soiled pure wick)        Time: 9833-8250 OT Time Calculation (min): 26 min  Charges: OT General Charges $OT Visit: 1 Visit OT Treatments $Self Care/Home Management : 23-37 mins  Marlyce Huge OT OT pager: [phone]   Irving Burton  Glynda Jaeger 04/07/2021, 12:21 PM

## 2021-04-07 NOTE — Progress Notes (Signed)
PROGRESS NOTE    Gina Hodges  IHK:[id number] DOB: 19-Apr-1927 DOA: 03/28/2021 PCP: Lottie Dawson   Brief Narrative:  87 yo female with PMH dementia, HLD, GERD, hypothyroidism who presented with altered mental status.  Due to severe hard of hearing and cognitive impairment, she is a difficult historian. She had recently been seen in the ER for a fall from her wheelchair.  CT head was negative at that time and she was started on antibiotics for a presumed UTI and discharged back to her nursing home.  Due to ongoing concern for altered mentation, she was sent back to the ER for further evaluation.  UA was still consistent with infection and she was started on Rocephin. She was also started on fluids for possible dehydration.  PT recommended SNF but insurance declined.  Palliative care team was also consulted given overall poor prognosis and guarded condition.  TOC team working on placement   Assessment & Plan:   Principal Problem:   UTI (urinary tract infection) Active Problems:   Dementia with behavioral problem (HCC)   Pressure injury of skin   Hypokalemia   Prediabetes   Malnutrition of moderate degree   UTI.  Urine culture with pansensitive E. coli.  Completed 5 days of Keflex   AKI.  Resolved.  Baseline creatinine 0.7  Prediabetes.  A1c of 5.9. Diet controlled.    History of hypokalemia.  repleted.    Dementia with behavioral problem [facility]) - Patient has underlying chronic cognitive impairment from this and may have some acute worsening of her confusion and mood in setting of infection. CT head - neg. need to encourage oral diet.  Followed by dietitian.   Thrombocytopenia (HCC)-resolved as of 03/29/2021   Hyponatremia-resolved as of 03/30/2021  Generalized weakness.  PT/OT= SNF but denied by insurance.  Has been appealed by the patient.    DVT prophylaxis:  SCDs Start: 03/28/21 1619 Code Status: DNR Family Communication: Nephew has been updated  Status is:  Inpatient  Remains inpatient appropriate because:Unsafe d/c plan.  Case management working on safe disposition planning  Dispo: The patient is from: Home              Anticipated d/c is to: ALF              Patient currently is medically stable to d/c.  Apparently family has appealed her discharge.  Awaiting decision   Difficult to place patient No       Nutritional status  Nutrition Problem: Moderate Malnutrition Etiology: chronic illness (dementia)  Signs/Symptoms: mild fat depletion, mild muscle depletion, moderate muscle depletion  Interventions: Ensure Enlive (each supplement provides 350kcal and 20 grams of protein), Magic cup, MVI  Body mass index is 26.03 kg/m.  Pressure Injury 03/28/21 Heel Left Stage 1 -  Intact skin with non-blanchable redness of a localized area usually over a bony prominence. (Active)  03/28/21 1700  Location: Heel  Location Orientation: Left  Staging: Stage 1 -  Intact skin with non-blanchable redness of a localized area usually over a bony prominence.  Wound Description (Comments):   Present on Admission: Yes    Subjective: Patient is sitting up in the bed   Examination: Constitutional: Not in acute distress, elderly frail Respiratory: Clear to auscultation bilaterally Cardiovascular: Normal sinus rhythm, no rubs Abdomen: Nontender nondistended good bowel sounds Musculoskeletal: No edema noted Skin: No rashes seen Neurologic: CN 2-12 grossly intact.  And nonfocal Psychiatric: Normal judgment and insight. Alert and oriented x 3. Normal mood.  Objective: Vitals:   04/06/21 0425 04/06/21 1225 04/06/21 2016 04/07/21 0502  BP: 107/76 112/73 115/85 121/64  Pulse: 94 93 (!) 106 90  Resp: 14 16 15 20   Temp: (!) 97.5 F (36.4 C) 97.6 F (36.4 C) 98.5 F (36.9 C) 97.6 F (36.4 C)  TempSrc: Oral Axillary Oral Oral  SpO2: 95% 97%  96%  Weight:      Height:        Intake/Output Summary (Last 24 hours) at 04/07/2021 1138 Last data  filed at 04/07/2021 0900 Gross per 24 hour  Intake 960 ml  Output 400 ml  Net 560 ml   Filed Weights   03/28/21 1047 03/28/21 1606  Weight: 68 kg 62.5 kg     Data Reviewed:   CBC: Recent Labs  Lab 04/05/21 0341 04/06/21 0417 04/07/21 0342  WBC 8.0 8.7 8.9  HGB 12.3 11.9* 10.7*  HCT 39.6 38.2 35.3*  MCV 96.1 94.6 95.4  PLT 291 349 328   Basic Metabolic Panel: Recent Labs  Lab 04/05/21 0341 04/06/21 0417 04/07/21 0342  NA 141 137 137  K 4.7 4.1 4.4  CL 100 98 101  CO2 32 31 27  GLUCOSE 110* 104* 101*  BUN 29* 30* 31*  CREATININE 0.74 0.69 0.76  CALCIUM 9.9 9.9 9.4  MG 1.9 2.0 2.1   GFR: Estimated Creatinine Clearance: 38.8 mL/min (by C-G formula based on SCr of 0.76 mg/dL). Liver Function Tests: No results for input(s): AST, ALT, ALKPHOS, BILITOT, PROT, ALBUMIN in the last 168 hours.  No results for input(s): LIPASE, AMYLASE in the last 168 hours. No results for input(s): AMMONIA in the last 168 hours. Coagulation Profile: No results for input(s): INR, PROTIME in the last 168 hours. Cardiac Enzymes: No results for input(s): CKTOTAL, CKMB, CKMBINDEX, TROPONINI in the last 168 hours. BNP (last 3 results) No results for input(s): PROBNP in the last 8760 hours. HbA1C: No results for input(s): HGBA1C in the last 72 hours. CBG: No results for input(s): GLUCAP in the last 168 hours. Lipid Profile: No results for input(s): CHOL, HDL, LDLCALC, TRIG, CHOLHDL, LDLDIRECT in the last 72 hours. Thyroid Function Tests: No results for input(s): TSH, T4TOTAL, FREET4, T3FREE, THYROIDAB in the last 72 hours. Anemia Panel: No results for input(s): VITAMINB12, FOLATE, FERRITIN, TIBC, IRON, RETICCTPCT in the last 72 hours. Sepsis Labs: No results for input(s): PROCALCITON, LATICACIDVEN in the last 168 hours.   Recent Results (from the past 240 hour(s))  Culture, Urine     Status: Abnormal   Collection Time: 03/28/21 12:00 PM   Specimen: Urine, Random  Result Value Ref  Range Status   Specimen Description   Final    URINE, RANDOM Performed at [facility], [address] [place], [place] [address]    Special Requests   Final    NONE Performed at [facility], [address] [place], [place] [address]    Culture >=100,000 COLONIES/mL ESCHERICHIA COLI (A)  Final   Report Status 03/31/2021 FINAL  Final   Organism ID, Bacteria ESCHERICHIA COLI (A)  Final      Susceptibility   Escherichia coli - MIC*    AMPICILLIN <=2 SENSITIVE Sensitive     CEFAZOLIN <=4 SENSITIVE Sensitive     CEFEPIME <=0.12 SENSITIVE Sensitive     CEFTRIAXONE <=0.25 SENSITIVE Sensitive     CIPROFLOXACIN <=0.25 SENSITIVE Sensitive     GENTAMICIN <=1 SENSITIVE Sensitive     IMIPENEM <=0.25 SENSITIVE Sensitive  NITROFURANTOIN <=16 SENSITIVE Sensitive     TRIMETH/SULFA <=20 SENSITIVE Sensitive     AMPICILLIN/SULBACTAM <=2 SENSITIVE Sensitive     PIP/TAZO <=4 SENSITIVE Sensitive     * >=100,000 COLONIES/mL ESCHERICHIA COLI  Culture, blood (Routine X 2) w Reflex to ID Panel     Status: None   Collection Time: 03/28/21  6:27 PM   Specimen: BLOOD  Result Value Ref Range Status   Specimen Description   Final    BLOOD LEFT HAND Performed at [facility], [address], [place], [place] [address]    Special Requests   Final    BOTTLES DRAWN AEROBIC ONLY Blood Culture adequate volume   Culture   Final    NO GROWTH 5 DAYS Performed at [facility], [address] [place], [place] [address]    Report Status 04/03/2021 FINAL  Final  Resp Panel by RT-PCR (Flu A&B, Covid) Nasopharyngeal Swab     Status: None   Collection Time: 04/01/21 10:27 AM   Specimen: Nasopharyngeal Swab; Nasopharyngeal(NP) swabs in vial transport medium  Result Value Ref Range Status   SARS Coronavirus 2 by RT PCR NEGATIVE NEGATIVE Final    Comment: (NOTE) SARS-CoV-2 target nucleic acids are NOT DETECTED.  The SARS-CoV-2 RNA is generally  detectable in upper respiratory specimens during the acute phase of infection. The lowest concentration of SARS-CoV-2 viral copies this assay can detect is 138 copies/mL. A negative result does not preclude SARS-Cov-2 infection and should not be used as the sole basis for treatment or other patient management decisions. A negative result may occur with  improper specimen collection/handling, submission of specimen other than nasopharyngeal swab, presence of viral mutation(s) within the areas targeted by this assay, and inadequate number of viral copies(<138 copies/mL). A negative result must be combined with clinical observations, patient history, and epidemiological information. The expected result is Negative.  Fact Sheet for Patients:  BloggerCourse.com  Fact Sheet for Healthcare Providers:  SeriousBroker.it  This test is no t yet approved or cleared by the [place] FDA and  has been authorized for detection and/or diagnosis of SARS-CoV-2 by FDA under an Emergency Use Authorization (EUA). This EUA will remain  in effect (meaning this test can be used) for the duration of the COVID-19 declaration under Section 564(b)(1) of the Act, 21 U.S.C.section 360bbb-3(b)(1), unless the authorization is terminated  or revoked sooner.       Influenza A by PCR NEGATIVE NEGATIVE Final   Influenza B by PCR NEGATIVE NEGATIVE Final    Comment: (NOTE) The Xpert Xpress SARS-CoV-2/FLU/RSV plus assay is intended as an aid in the diagnosis of influenza from Nasopharyngeal swab specimens and should not be used as a sole basis for treatment. Nasal washings and aspirates are unacceptable for Xpert Xpress SARS-CoV-2/FLU/RSV testing.  Fact Sheet for Patients: BloggerCourse.com  Fact Sheet for Healthcare Providers: SeriousBroker.it  This test is not yet approved or cleared by the [place] FDA  and has been authorized for detection and/or diagnosis of SARS-CoV-2 by FDA under an Emergency Use Authorization (EUA). This EUA will remain in effect (meaning this test can be used) for the duration of the COVID-19 declaration under Section 564(b)(1) of the Act, 21 U.S.C. section 360bbb-3(b)(1), unless the authorization is terminated or revoked.  Performed at [facility], [address] [place], [place] [address]          Radiology Studies: No results found.      Scheduled Meds:  ALPRAZolam  0.5 mg Oral BID   calcium-vitamin D  1 tablet Oral BID   feeding supplement  237 mL Oral BID BM   hydrocortisone cream   Topical BID   levothyroxine  75 mcg Oral Q0600   loratadine  10 mg Oral Q0600   melatonin  10 mg Oral QHS   mirtazapine  15 mg Oral QHS   multivitamin with minerals  1 tablet Oral Q0600   senna  1 tablet Oral Q0600   traZODone  75 mg Oral QHS   venlafaxine XR  75 mg Oral Q0600   Continuous Infusions:   LOS: 10 days   Time spent= 35 mins    Easter Kennebrew Joline Maxcy, MD [facility]  If 7PM-7AM, please contact night-coverage  04/07/2021, 11:38 AM

## 2021-04-07 NOTE — Progress Notes (Signed)
Nutrition Follow-up  DOCUMENTATION CODES:   Non-severe (moderate) malnutrition in context of chronic illness  INTERVENTION:  - continue Ensure Enlive BID and Magic Cup BID.    NUTRITION DIAGNOSIS:   Moderate Malnutrition related to chronic illness (dementia) as evidenced by mild fat depletion, mild muscle depletion, moderate muscle depletion. -ongoing  GOAL:   Patient will meet greater than or equal to 90% of their needs -met on average  MONITOR:   PO intake, Supplement acceptance, Labs, Weight trends  ASSESSMENT:   87 yo female with medical history of dementia, HLD, GERD, and hypothyroidism. She presented to ED with AMS. Patient is noted to be severely HOH with cognitive impairment. She was recently in the ED following a fall from her wheelchair. UA in the ED consistent with UTI. She was started on IV fluids d/t possible dehydration.  Recently documented meal intakes: 6/30- 75% of breakfast and 50% of lunch 7/1- 75% of breakfast and 100% of lunch 7/2- 25% of breakfast 7/4- 10% of breakfast, 25% of lunch, and 50% of dinner  She has been accepting Ensure 100% of the time offered.  Patient is noted to be a/o to self only. Discharge order entered earlier this AM for d/c to LTC; d/c summary not in yet.      Labs reviewed; BUN: 31 mg/dl. Medications reviewed; 1 tablet oscal-D BID, 75 mcg oral synthroid/day, 10 mg melatonin/day, 1 tablet multivitamin with minerals/day, 1 tablet senokot/day.   Diet Order:   Diet Order             DIET - DYS 1 Room service appropriate? Yes; Fluid consistency: Honey Thick  Diet effective now           Diet - low sodium heart healthy                   EDUCATION NEEDS:   No education needs have been identified at this time  Skin:  Skin Assessment: Skin Integrity Issues: Skin Integrity Issues:: Stage I, Other (Comment) Stage I: L heel Other: MASD to perineum  Last BM:  7/4 (type 4 x1)  Height:   Ht Readings from Last 1  Encounters:  03/28/21 _0  (1.549 m)    Weight:   Wt Readings from Last 1 Encounters:  03/28/21 62.5 kg      Estimated Nutritional Needs:  Kcal:  1565-1750 kcal Protein:  75-90 grams Fluid:  >/= 1.6 L/day      Jarome Matin, MS, RD, LDN, CNSC Inpatient Clinical Dietitian RD pager # available in [facility]  After hours/weekend pager # available in [facility]

## 2021-04-08 LAB — CBC
HCT: 36.4 % (ref 36.0–46.0)
Hemoglobin: 11.3 g/dL — ABNORMAL LOW (ref 12.0–15.0)
MCH: 29.4 pg (ref 26.0–34.0)
MCHC: 31 g/dL (ref 30.0–36.0)
MCV: 94.5 fL (ref 80.0–100.0)
Platelets: 305 10*3/uL (ref 150–400)
RBC: 3.85 MIL/uL — ABNORMAL LOW (ref 3.87–5.11)
RDW: 16.2 % — ABNORMAL HIGH (ref 11.5–15.5)
WBC: 8.7 10*3/uL (ref 4.0–10.5)
nRBC: 0 % (ref 0.0–0.2)

## 2021-04-08 LAB — BASIC METABOLIC PANEL
Anion gap: 8 (ref 5–15)
BUN: 23 mg/dL (ref 8–23)
CO2: 29 mmol/L (ref 22–32)
Calcium: 9.4 mg/dL (ref 8.9–10.3)
Chloride: 99 mmol/L (ref 98–111)
Creatinine, Ser: 0.75 mg/dL (ref 0.44–1.00)
GFR, Estimated: >60 mL/min (ref >60)
Glucose, Bld: 91 mg/dL (ref 70–99)
Potassium: 4.6 mmol/L (ref 3.5–5.1)
Sodium: 136 mmol/L (ref 135–145)

## 2021-04-08 LAB — MAGNESIUM: Magnesium: 2 mg/dL (ref 1.7–2.4)

## 2021-04-08 LAB — RESP PANEL BY RT-PCR (FLU A&B, COVID) ARPGX2
Influenza A by PCR: NEGATIVE
Influenza B by PCR: NEGATIVE
SARS Coronavirus 2 by RT PCR: NEGATIVE

## 2021-04-08 NOTE — Plan of Care (Signed)
  Problem: Activity: Goal: Risk for activity intolerance will decrease Outcome: Progressing   

## 2021-04-08 NOTE — Plan of Care (Signed)
  Problem: Activity: Goal: Risk for activity intolerance will decrease Outcome: Progressing   Problem: Nutrition: Goal: Adequate nutrition will be maintained Outcome: Progressing   Problem: Activity: Goal: Risk for activity intolerance will decrease Outcome: Progressing

## 2021-04-08 NOTE — TOC Progression Note (Signed)
Transition of Care [facility]) - Progression Note    Patient Details  Name: Gina Hodges MRN: [id number] Date of Birth: [birth year]  [facility]) CM/SW Contact  Geni Bers, RN Phone Number: 04/08/2021, 3:25 PM  Clinical Narrative:    Pt will transport to [place] [facility]. Nephew is aware.    Expected Discharge Plan: Assisted Living Barriers to Discharge: No Barriers Identified  Expected Discharge Plan and Services Expected Discharge Plan: Assisted Living       Living arrangements for the past 2 months: Assisted Living Facility Expected Discharge Date: 04/07/21                                     Social Determinants of Health (SDOH) Interventions    Readmission Risk Interventions Readmission Risk Prevention Plan 11/12/2019  Post Dischage Appt Not Complete  Appt Comments facility resident  Medication Screening Complete  Transportation Screening Complete  Some recent data might be hidden

## 2021-04-08 NOTE — Discharge Summary (Signed)
Physician Discharge Summary  Gina Hodges BWG:[id number] DOB: Nov 15, 1926 DOA: 03/28/2021  PCP: Fransico [facility]  Admit date: 03/28/2021 Discharge date: 04/08/2021  Admitted From: Rehab Disposition: Rehab  Recommendations for Outpatient Follow-up:  Follow up with PCP in 1-2 weeks Please obtain BMP/CBC in one week your next doctors visit.  Recommend palliative care team to continue following Advance diet as tolerated. She does better with regular water therefore continue.  Continue providing Ensure minimum 3 times a day with her meals   Discharge Condition: Stable CODE STATUS: DNR Diet recommendation: Dysphagia I diet  Brief/Interim Summary: 87 yo female with PMH dementia, HLD, GERD, hypothyroidism who presented with altered mental status.  Due to severe hard of hearing and cognitive impairment, she is a difficult historian. She had recently been seen in the ER for a fall from her wheelchair.  CT head was negative at that time and she was started on antibiotics for a presumed UTI and discharged back to her nursing home.  Due to ongoing concern for altered mentation, she was sent back to the ER for further evaluation.  UA was still consistent with infection and she was started on Rocephin. She was also started on fluids for possible dehydration.  PT recommended SNF but insurance declined.  Palliative care team was also consulted given overall poor prognosis and guarded condition.  With the assistance of TOC, arrangements for placement were made.     UTI.  Urine culture with pansensitive E. coli.  Completed 5 days of Keflex   AKI.  Resolved.  Baseline creatinine 0.7  Prediabetes.  A1c of 5.9. Diet controlled.   History of hypokalemia.  repleted.   Dementia with behavioral problem (HCC) -This is stable.  CT head is negative.   Thrombocytopenia (HCC)-resolved as of 03/29/2021   Hyponatremia-resolved as of 03/30/2021   Generalized weakness.  PT/OT= SNF but denied by  insurance.  TOC has arranged for placement.  Seen by palliative care team to establish goals of care  Body mass index is 26.03 kg/m.  Pressure Injury 03/28/21 Heel Left Stage 1 -  Intact skin with non-blanchable redness of a localized area usually over a bony prominence. (Active)  03/28/21 1700  Location: Heel  Location Orientation: Left  Staging: Stage 1 -  Intact skin with non-blanchable redness of a localized area usually over a bony prominence.  Wound Description (Comments):   Present on Admission: Yes        Discharge Diagnoses:  Principal Problem:   UTI (urinary tract infection) Active Problems:   Dementia with behavioral problem (HCC)   Pressure injury of skin   Hypokalemia   Prediabetes   Malnutrition of moderate degree      Consultations: Palliative care  Subjective: Patient feels okay no complaints  Discharge Exam: Vitals:   04/07/21 2345 04/08/21 0419  BP: (!) 107/59 97/66  Pulse: 76 64  Resp: 18 16  Temp: 97.8 F (36.6 C) 97.7 F (36.5 C)  SpO2: 94% 99%   Vitals:   04/07/21 0502 04/07/21 1250 04/07/21 2345 04/08/21 0419  BP: 121/64 102/74 (!) 107/59 97/66  Pulse: 90 89 76 64  Resp: 20 16 18 16   Temp: 97.6 F (36.4 C) 97.6 F (36.4 C) 97.8 F (36.6 C) 97.7 F (36.5 C)  TempSrc: Oral  Axillary Oral  SpO2: 96% 97% 94% 99%  Weight:      Height:        General: Pt is alert, awake, not in acute distress, elderly frail Cardiovascular: RRR, S1/S2 +,  no rubs, no gallops Respiratory: CTA bilaterally, no wheezing, no rhonchi Abdominal: Soft, NT, ND, bowel sounds + Extremities: no edema, no cyanosis  Discharge Instructions  Discharge Instructions     Diet - low sodium heart healthy   Complete by: As directed    Increase activity slowly   Complete by: As directed    No dressing needed   Complete by: As directed       Allergies as of 04/08/2021       Reactions   Penicillins Other (See Comments)   "Allergic," per [facility] Did it involve  swelling of the face/tongue/throat, SOB, or low BP? Unk Did it involve sudden or severe rash/hives, skin peeling, or any reaction on the inside of your mouth or nose? Unk Did you need to seek medical attention at a hospital or doctor's office? Unk When did it last happen? Unk If all above answers are "NO", may proceed with cephalosporin use.   Bactrim [sulfamethoxazole-trimethoprim] Other (See Comments)   "Allergic," per [facility]   Sulfa Antibiotics Other (See Comments)   "Allergic," per [facility]   Codeine Palpitations, Other (See Comments)   "Allergic," per [facility]        Medication List     STOP taking these medications    amoxicillin-clavulanate 875-125 MG tablet Commonly known as: AUGMENTIN   cephALEXin 500 MG capsule Commonly known as: KEFLEX       TAKE these medications    acetaminophen 650 MG CR tablet Commonly known as: TYLENOL Take 650 mg by mouth 3 (three) times daily.   ALPRAZolam 0.5 MG tablet Commonly known as: XANAX Take 1 tablet (0.5 mg total) by mouth 2 (two) times daily.   alum & mag hydroxide-simeth 200-200-20 MG/5ML suspension Commonly known as: MAALOX/MYLANTA Take 30 mLs by mouth every 8 (eight) hours as needed (acid relief).   Calcium Carbonate-Vitamin D 600-400 MG-UNIT tablet Take 1 tablet by mouth 2 (two) times daily.   feeding supplement Liqd Take 237 mLs by mouth 2 (two) times daily between meals.   food thickener Gel Commonly known as: SIMPLYTHICK (NECTAR/LEVEL 2/MILDLY THICK) Take 1 packet by mouth as needed.   hydrocortisone cream 1 % Apply topically 2 (two) times daily.   levothyroxine 75 MCG tablet Commonly known as: SYNTHROID Take 75 mcg by mouth daily at 6 (six) AM.   Lidocaine 4 % Ptch Place 1 patch onto the skin 2 (two) times daily.   liver oil-zinc oxide 40 % ointment Commonly known as: DESITIN Apply topically 3 (three) times daily as needed for irritation.   loperamide 2 MG capsule Commonly known as: IMODIUM Take 2 mg by mouth  every 2 (two) hours as needed for diarrhea or loose stools.   loratadine 10 MG tablet Commonly known as: CLARITIN Take 10 mg by mouth daily at 6 (six) AM.   Melatonin 10 MG Tabs Take 10 mg by mouth at bedtime.   meloxicam 15 MG tablet Commonly known as: MOBIC Take 15 mg by mouth daily at 6 (six) AM.   mirtazapine 15 MG tablet Commonly known as: REMERON Take 15 mg by mouth at bedtime.   multivitamin with minerals Tabs tablet Take 1 tablet by mouth daily at 6 (six) AM.   omeprazole 20 MG capsule Commonly known as: PRILOSEC Take 20 mg by mouth 2 (two) times daily.   OXYGEN Inhale 2 L into the lungs continuous.   polyethylene glycol 17 g packet Commonly known as: MIRALAX / GLYCOLAX Take 17 g by mouth daily as needed (  constipation).   senna 8.6 MG Tabs tablet Commonly known as: SENOKOT Take 1 tablet by mouth daily at 6 (six) AM.   TRAZODONE HCL PO Take 75 mg by mouth at bedtime.   venlafaxine XR 75 MG 24 hr capsule Commonly known as: EFFEXOR-XR Take 75 mg by mouth daily at 6 (six) AM.               Discharge Care Instructions  (From admission, onward)           Start     Ordered   04/03/21 0000  No dressing needed        04/03/21 1610            Contact information for follow-up providers     [place], [facility] Follow up in 1 week(s).   Specialty: Assisted Living Facility Contact information: [address] [place] [address] [phone]         Jodelle Red, MD .   Specialty: Cardiology Contact information: [address] [place] [address] [place] [address] [phone]              Contact information for after-discharge care     Destination     [facility] Preferred SNF .   Service: Skilled Nursing Contact information: [address] [place] [address] [phone]                    Allergies  Allergen Reactions   Penicillins Other (See Comments)     "Allergic," per [facility] Did it involve swelling of the face/tongue/throat, SOB, or low BP? Unk Did it involve sudden or severe rash/hives, skin peeling, or any reaction on the inside of your mouth or nose? Unk Did you need to seek medical attention at a hospital or doctor's office? Unk When did it last happen? Unk If all above answers are "NO", may proceed with cephalosporin use.   Bactrim [Sulfamethoxazole-Trimethoprim] Other (See Comments)    "Allergic," per [facility]   Sulfa Antibiotics Other (See Comments)    "Allergic," per [facility]   Codeine Palpitations and Other (See Comments)    "Allergic," per [facility]    You were cared for by a hospitalist during your hospital stay. If you have any questions about your discharge medications or the care you received while you were in the hospital after you are discharged, you can call the unit and asked to speak with the hospitalist on call if the hospitalist that took care of you is not available. Once you are discharged, your primary care physician will handle any further medical issues. Please note that no refills for any discharge medications will be authorized once you are discharged, as it is imperative that you return to your primary care physician (or establish a relationship with a primary care physician if you do not have one) for your aftercare needs so that they can reassess your need for medications and monitor your lab values.   Procedures/Studies: CT Head Wo Contrast  Result Date: 03/27/2021 CLINICAL DATA:  Fall from wheelchair. EXAM: CT HEAD WITHOUT CONTRAST TECHNIQUE: Contiguous axial images were obtained from the base of the skull through the vertex without intravenous contrast. COMPARISON:  03/14/2021 FINDINGS: The study is mildly motion degraded despite repeat imaging. Brain: There is no evidence of an acute infarct, intracranial hemorrhage, mass, midline shift, or extra-axial fluid collection. Advanced cerebral atrophy is again noted. Confluent  hypodensities in the cerebral white matter bilaterally are similar to the prior  study and are nonspecific but compatible with severe chronic small vessel ischemic disease. There are chronic lacunar infarcts in the thalami and inferior right cerebellum. Vascular: Calcified atherosclerosis at the skull base. No hyperdense vessel. Skull: No fracture or suspicious osseous lesion. Sinuses/Orbits: Large volume fluid in the left sphenoid sinus, slightly increased from the prior CT. Clear mastoid air cells. Bilateral cataract extraction. Other: None. IMPRESSION: 1. No evidence of acute intracranial abnormality. 2. Severe chronic small vessel ischemic disease and advanced cerebral atrophy. 3. Increased fluid in the left sphenoid sinus. Correlate for acute sinusitis. Electronically Signed   By: Sebastian Ache M.D.   On: 03/27/2021 15:04   CT HEAD WO CONTRAST  Result Date: 03/14/2021 CLINICAL DATA:  Mental status change EXAM: CT HEAD WITHOUT CONTRAST TECHNIQUE: Contiguous axial images were obtained from the base of the skull through the vertex without intravenous contrast. COMPARISON:  CT scan of the head 11/14/2020 FINDINGS: Brain: No evidence of acute infarction, hemorrhage, hydrocephalus, extra-axial collection or mass lesion/mass effect. 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 Remote lacunar right cerebellar infarct. Vascular: No hyperdense vessel or unexpected calcification. Skull: Normal. Negative for fracture or focal lesion. Sinuses/Orbits: No acute finding. Other: None. IMPRESSION: 1. No acute intracranial abnormality. 2. 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 Electronically Signed   By: Malachy Moan M.D.   On:  03/14/2021 10:35   CT ABDOMEN PELVIS W CONTRAST  Result Date: 03/14/2021 CLINICAL DATA:  Abdominal pain. EXAM: CT ABDOMEN AND PELVIS WITH CONTRAST TECHNIQUE: Multidetector CT imaging of the abdomen and pelvis was performed using the standard protocol following bolus administration of intravenous contrast. CONTRAST:  71mL OMNIPAQUE IOHEXOL 300 MG/ML  SOLN COMPARISON:  CT scan 11/10/2019 FINDINGS: Lower chest: Small to moderate-sized right pleural effusion is noted with overlying atelectasis. The heart is normal in size for age.  No pericardial effusion. Large hiatal hernia. Hepatobiliary: Stable low-attenuation lesion in segment 7 of the liver is likely a benign cyst. No worrisome hepatic lesions. The gallbladder is surgically absent. There is stable mild central intrahepatic and common bile duct dilatation. Pancreas: No mass, inflammation or ductal dilatation. Moderate stable diffuse pancreatic atrophy. Spleen: Normal size.  No focal lesions. Adrenals/Urinary Tract: Adrenal glands are normal. Bilateral renal cysts are stable. No worrisome renal lesions or hydronephrosis. The bladder is unremarkable. Stomach/Bowel: The stomach, duodenum, small bowel and colon are grossly normal. No acute inflammatory changes, mass lesions or obstructive findings. Vascular/Lymphatic: Stable aortic calcifications but no aneurysm or dissection. The branch vessels are patent. The major venous structures are patent. No mesenteric or retroperitoneal mass or adenopathy. Reproductive: Surgically absent. Other: No pelvic mass or adenopathy. No free pelvic fluid collections. No inguinal mass or adenopathy. No abdominal wall hernia or subcutaneous lesions. Musculoskeletal: No significant bony findings. Remote vertebral augmentation changes are noted the mid and lower thoracic spine. IMPRESSION: 1. No acute abdominal/pelvic findings, mass lesions or adenopathy. 2. Small to moderate-sized right pleural effusion with overlying atelectasis. 3.  Large hiatal hernia. 4. Status post cholecystectomy with stable mild central intrahepatic and common bile duct dilatation. 5. Stable bilateral renal cysts. 6. Aortic atherosclerosis. Aortic Atherosclerosis (ICD10-I70.0). Electronically Signed   By: Rudie Meyer M.D.   On: 03/14/2021 12:52   US RENAL  Result Date: 03/29/2021 CLINICAL DATA:  Acute kidney injury.  Follow-up to CT performed earlier today. EXAM: RENAL / URINARY TRACT ULTRASOUND COMPLETE COMPARISON:  CT, 03/28/2021 at 1:26 p.m. FINDINGS: Right Kidney: Renal measurements: 8.7 x 4.4 x 4.1 cm = volume: 83 mL. Normal parenchymal echogenicity. Large cyst arises from the lower pole measuring 12 x 6 x 9 cm. No other masses, no stones and no hydronephrosis. Left Kidney: Renal measurements: 8.6 x 4.4 x 4.4 cm = volume: 88 mL. Normal parenchymal echogenicity. Exophytic cyst arises from the lateral upper pole measuring 1.5 x 1.4 x 2.0 cm. No other masses, no stones and no hydronephrosis. Bladder: Appears normal for degree of bladder distention. Other: None. IMPRESSION: 1. No acute findings.  No hydronephrosis. 2. Single bilateral renal cysts. 3. Relatively small kidneys. Electronically Signed   By: Amie Portland M.D.   On: 03/29/2021 07:03   DG Chest Port 1 View  Result Date: 03/28/2021 CLINICAL DATA:  Cough EXAM: PORTABLE CHEST 1 VIEW COMPARISON:  03/14/2021 FINDINGS: The heart size and mediastinal contours are within normal limits. Mild, diffuse bilateral interstitial pulmonary opacity on low volume AP portable. The visualized skeletal structures are unremarkable. IMPRESSION: Mild, diffuse bilateral interstitial pulmonary opacity on low volume AP portable, likely mild edema. No focal airspace opacity. Electronically Signed   By: Lauralyn Primes M.D.   On: 03/28/2021 11:55   DG Chest Port 1 View  Result Date: 03/14/2021 CLINICAL DATA:  Sepsis. EXAM: PORTABLE CHEST 1 VIEW COMPARISON:  03/05/2021 FINDINGS: The cardiac silhouette, mediastinal and hilar  contours are within normal limits and stable. Chronic bronchitic type lung changes are again noted. There appears to be persistent left basilar density which based on a prior CT scan appears to be a large hiatal hernia. No pleural effusions or pneumothorax. IMPRESSION: Chronic bronchitic type lung changes but no acute pulmonary findings. Large hiatal hernia. Electronically Signed   By: Rudie Meyer M.D.   On: 03/14/2021 10:51   DG Knee Complete 4 Views Left  Result Date: 03/27/2021 CLINICAL DATA:  Fall with LEFT knee pain. Fall from wheelchair in a 87 year old female. EXAM: LEFT KNEE - COMPLETE 4+ VIEW COMPARISON:  None FINDINGS: Four views LEFT knee without sign of fracture, dislocation or joint effusion. Osteopenia. Mild degenerative changes. IMPRESSION: No fracture or dislocation. Osteopenia and mild degenerative changes. Electronically Signed   By: Donzetta Kohut M.D.   On: 03/27/2021 15:05   CT Renal Stone Study  Result Date: 03/28/2021 CLINICAL DATA:  Fluid.  Co UTI EXAM: CT ABDOMEN AND PELVIS WITHOUT CONTRAST TECHNIQUE: Multidetector CT imaging of the abdomen and pelvis was performed following the standard protocol without IV contrast. COMPARISON:  03/14/2021 FINDINGS: Lower chest: Small right pleural effusion associated atelectasis or consolidation. Cardiomegaly. Large, partially imaged hiatal hernia with intrathoracic position of the gastric body and fundus. Hepatobiliary: No focal liver abnormality is seen. Status post cholecystectomy. Postoperative biliary dilatation. Pancreas: Unremarkable. No pancreatic ductal dilatation or surrounding inflammatory changes. Spleen: Normal in size without significant abnormality. Adrenals/Urinary Tract: Adrenal glands are unremarkable. Large, exophytic cyst of the inferior pole of the right kidney measuring at least 11.6 cm. No urinary tract calculi or hydronephrosis. Bladder is unremarkable. Stomach/Bowel: Stomach is within normal limits. Appendix appears  normal. No evidence of bowel wall thickening, distention, or inflammatory changes. Vascular/Lymphatic: Aortic atherosclerosis. No enlarged abdominal or pelvic lymph nodes. Reproductive: Status post hysterectomy. Other: No abdominal wall hernia or abnormality. No abdominopelvic ascites. Musculoskeletal: No acute or significant osseous findings. IMPRESSION: 1. Small right pleural effusion with associated atelectasis or consolidation, slightly diminished  compared to prior examination. 2. Large, partially imaged hiatal hernia with intrathoracic position of the gastric body and fundus. 3. No urinary tract calculi or hydronephrosis. Large, exophytic cyst of the inferior pole of the right kidney measuring at least 11.6 cm, possibly symptomatic due to large size. 4. Status post cholecystectomy and hysterectomy. Aortic Atherosclerosis (ICD10-I70.0). Electronically Signed   By: Lauralyn Primes M.D.   On: 03/28/2021 13:48     The results of significant diagnostics from this hospitalization (including imaging, microbiology, ancillary and laboratory) are listed below for reference.     Microbiology: Recent Results (from the past 240 hour(s))  Resp Panel by RT-PCR (Flu A&B, Covid) Nasopharyngeal Swab     Status: None   Collection Time: 04/01/21 10:27 AM   Specimen: Nasopharyngeal Swab; Nasopharyngeal(NP) swabs in vial transport medium  Result Value Ref Range Status   SARS Coronavirus 2 by RT PCR NEGATIVE NEGATIVE Final    Comment: (NOTE) SARS-CoV-2 target nucleic acids are NOT DETECTED.  The SARS-CoV-2 RNA is generally detectable in upper respiratory specimens during the acute phase of infection. The lowest concentration of SARS-CoV-2 viral copies this assay can detect is 138 copies/mL. A negative result does not preclude SARS-Cov-2 infection and should not be used as the sole basis for treatment or other patient management decisions. A negative result may occur with  improper specimen collection/handling,  submission of specimen other than nasopharyngeal swab, presence of viral mutation(s) within the areas targeted by this assay, and inadequate number of viral copies(<138 copies/mL). A negative result must be combined with clinical observations, patient history, and epidemiological information. The expected result is Negative.  Fact Sheet for Patients:  BloggerCourse.com  Fact Sheet for Healthcare Providers:  SeriousBroker.it  This test is no t yet approved or cleared by the [place] FDA and  has been authorized for detection and/or diagnosis of SARS-CoV-2 by FDA under an Emergency Use Authorization (EUA). This EUA will remain  in effect (meaning this test can be used) for the duration of the COVID-19 declaration under Section 564(b)(1) of the Act, 21 U.S.C.section 360bbb-3(b)(1), unless the authorization is terminated  or revoked sooner.       Influenza A by PCR NEGATIVE NEGATIVE Final   Influenza B by PCR NEGATIVE NEGATIVE Final    Comment: (NOTE) The Xpert Xpress SARS-CoV-2/FLU/RSV plus assay is intended as an aid in the diagnosis of influenza from Nasopharyngeal swab specimens and should not be used as a sole basis for treatment. Nasal washings and aspirates are unacceptable for Xpert Xpress SARS-CoV-2/FLU/RSV testing.  Fact Sheet for Patients: BloggerCourse.com  Fact Sheet for Healthcare Providers: SeriousBroker.it  This test is not yet approved or cleared by the [place] FDA and has been authorized for detection and/or diagnosis of SARS-CoV-2 by FDA under an Emergency Use Authorization (EUA). This EUA will remain in effect (meaning this test can be used) for the duration of the COVID-19 declaration under Section 564(b)(1) of the Act, 21 U.S.C. section 360bbb-3(b)(1), unless the authorization is terminated or revoked.  Performed at [facility], [address], [place], [place] [address]      Labs: BNP (last 3 results) Recent Labs    07/15/20 1505 03/28/21 1048 04/05/21 0341  BNP 141.6* 27.5 40.7   Basic Metabolic Panel: Recent Labs  Lab 04/05/21 0341 04/06/21 0417 04/07/21 0342 04/08/21 0334  NA 141 137 137 136  K 4.7 4.1 4.4 4.6  CL 100 98 101 99  CO2 32 GLUCOSE  110* 104* 101* 91  BUN 29* 30* 31* 23  CREATININE 0.74 0.69 0.76 0.75  CALCIUM 9.9 9.9 9.4 9.4  MG 1.9 2.0 2.1 2.0   Liver Function Tests: No results for input(s): AST, ALT, ALKPHOS, BILITOT, PROT, ALBUMIN in the last 168 hours. No results for input(s): LIPASE, AMYLASE in the last 168 hours. No results for input(s): AMMONIA in the last 168 hours. CBC: Recent Labs  Lab 04/05/21 0341 04/06/21 0417 04/07/21 0342 04/08/21 0334  WBC 8.0 8.7 8.9 8.7  HGB 12.3 11.9* 10.7* 11.3*  HCT 39.6 38.2 35.3* 36.4  MCV 96.1 94.6 95.4 94.5  PLT 291 349 328 305   Cardiac Enzymes: No results for input(s): CKTOTAL, CKMB, CKMBINDEX, TROPONINI in the last 168 hours. BNP: Invalid input(s): POCBNP CBG: No results for input(s): GLUCAP in the last 168 hours. D-Dimer No results for input(s): DDIMER in the last 72 hours. Hgb A1c No results for input(s): HGBA1C in the last 72 hours. Lipid Profile No results for input(s): CHOL, HDL, LDLCALC, TRIG, CHOLHDL, LDLDIRECT in the last 72 hours. Thyroid function studies No results for input(s): TSH, T4TOTAL, T3FREE, THYROIDAB in the last 72 hours.  Invalid input(s): FREET3 Anemia work up No results for input(s): VITAMINB12, FOLATE, FERRITIN, TIBC, IRON, RETICCTPCT in the last 72 hours. Urinalysis    Component Value Date/Time   COLORURINE YELLOW 03/28/2021 1200   APPEARANCEUR CLOUDY (A) 03/28/2021 1200   LABSPEC 1.015 03/28/2021 1200   PHURINE 7.0 03/28/2021 1200   GLUCOSEU NEGATIVE 03/28/2021 1200   HGBUR TRACE (A) 03/28/2021 1200   BILIRUBINUR NEGATIVE 03/28/2021 1200   KETONESUR NEGATIVE  03/28/2021 1200   PROTEINUR 30 (A) 03/28/2021 1200   NITRITE POSITIVE (A) 03/28/2021 1200   LEUKOCYTESUR LARGE (A) 03/28/2021 1200   Sepsis Labs Invalid input(s): PROCALCITONIN,  WBC,  LACTICIDVEN Microbiology Recent Results (from the past 240 hour(s))  Resp Panel by RT-PCR (Flu A&B, Covid) Nasopharyngeal Swab     Status: None   Collection Time: 04/01/21 10:27 AM   Specimen: Nasopharyngeal Swab; Nasopharyngeal(NP) swabs in vial transport medium  Result Value Ref Range Status   SARS Coronavirus 2 by RT PCR NEGATIVE NEGATIVE Final    Comment: (NOTE) SARS-CoV-2 target nucleic acids are NOT DETECTED.  The SARS-CoV-2 RNA is generally detectable in upper respiratory specimens during the acute phase of infection. The lowest concentration of SARS-CoV-2 viral copies this assay can detect is 138 copies/mL. A negative result does not preclude SARS-Cov-2 infection and should not be used as the sole basis for treatment or other patient management decisions. A negative result may occur with  improper specimen collection/handling, submission of specimen other than nasopharyngeal swab, presence of viral mutation(s) within the areas targeted by this assay, and inadequate number of viral copies(<138 copies/mL). A negative result must be combined with clinical observations, patient history, and epidemiological information. The expected result is Negative.  Fact Sheet for Patients:  BloggerCourse.com  Fact Sheet for Healthcare Providers:  SeriousBroker.it  This test is no t yet approved or cleared by the [place] FDA and  has been authorized for detection and/or diagnosis of SARS-CoV-2 by FDA under an Emergency Use Authorization (EUA). This EUA will remain  in effect (meaning this test can be used) for the duration of the COVID-19 declaration under Section 564(b)(1) of the Act, 21 U.S.C.section 360bbb-3(b)(1), unless the authorization is  terminated  or revoked sooner.       Influenza A by PCR NEGATIVE NEGATIVE Final   Influenza B by PCR NEGATIVE  NEGATIVE Final    Comment: (NOTE) The Xpert Xpress SARS-CoV-2/FLU/RSV plus assay is intended as an aid in the diagnosis of influenza from Nasopharyngeal swab specimens and should not be used as a sole basis for treatment. Nasal washings and aspirates are unacceptable for Xpert Xpress SARS-CoV-2/FLU/RSV testing.  Fact Sheet for Patients: BloggerCourse.comhttps://www.fda.gov/media/152166/download  Fact Sheet for Healthcare Providers: SeriousBroker.ithttps://www.fda.gov/media/152162/download  This test is not yet approved or cleared by the [place] FDA and has been authorized for detection and/or diagnosis of SARS-CoV-2 by FDA under an Emergency Use Authorization (EUA). This EUA will remain in effect (meaning this test can be used) for the duration of the COVID-19 declaration under Section 564(b)(1) of the Act, 21 U.S.C. section 360bbb-3(b)(1), unless the authorization is terminated or revoked.  Performed at [facility], [address] [place], [place] [address]      Time coordinating discharge:  I have spent 35 minutes face to face with the patient and on the ward discussing the patients care, assessment, plan and disposition with other care givers. >50% of the time was devoted counseling the patient about the risks and benefits of treatment/Discharge disposition and coordinating care.   SIGNED:   Dimple NanasAnkit Chirag Calix Heinbaugh, MD  [facility] 04/08/2021, 10:28 AM   If 7PM-7AM, please contact night-coverage

## 2021-04-08 NOTE — Progress Notes (Signed)
Removed pt's IV intact. PT is aware she is going to a facility.  Gina Hodges

## 2021-04-09 ENCOUNTER — Emergency Department
Admission: EM | Admit: 2021-04-09 | Discharge: 2021-04-09 | Disposition: A | Discharge status: Home / Self Care | Payer: Medicare HMO | Attending: Emergency Medicine | Admitting: Emergency Medicine

## 2021-04-09 ENCOUNTER — Emergency Department: Payer: Medicare HMO

## 2021-04-09 ENCOUNTER — Other Ambulatory Visit: Payer: Self-pay

## 2021-04-09 ENCOUNTER — Encounter: Payer: Self-pay | Admitting: Emergency Medicine

## 2021-04-09 DIAGNOSIS — Y92129 Unspecified place in nursing home as the place of occurrence of the external cause: Secondary | ICD-10-CM | POA: Insufficient documentation

## 2021-04-09 DIAGNOSIS — R0902 Hypoxemia: Secondary | ICD-10-CM | POA: Insufficient documentation

## 2021-04-09 DIAGNOSIS — E039 Hypothyroidism, unspecified: Secondary | ICD-10-CM | POA: Insufficient documentation

## 2021-04-09 DIAGNOSIS — F0390 Unspecified dementia without behavioral disturbance: Secondary | ICD-10-CM | POA: Insufficient documentation

## 2021-04-09 DIAGNOSIS — W19XXXA Unspecified fall, initial encounter: Secondary | ICD-10-CM | POA: Diagnosis not present

## 2021-04-09 DIAGNOSIS — Z79899 Other long term (current) drug therapy: Secondary | ICD-10-CM | POA: Diagnosis not present

## 2021-04-09 LAB — BASIC METABOLIC PANEL
Anion gap: 9 (ref 5–15)
BUN: 19 mg/dL (ref 8–23)
CO2: 27 mmol/L (ref 22–32)
Calcium: 9.7 mg/dL (ref 8.9–10.3)
Chloride: 100 mmol/L (ref 98–111)
Creatinine, Ser: 0.76 mg/dL (ref 0.44–1.00)
GFR, Estimated: >60 mL/min (ref >60)
Glucose, Bld: 104 mg/dL — ABNORMAL HIGH (ref 70–99)
Potassium: 4.1 mmol/L (ref 3.5–5.1)
Sodium: 136 mmol/L (ref 135–145)

## 2021-04-09 LAB — CBC
HCT: 37.1 % (ref 36.0–46.0)
Hemoglobin: 11.7 g/dL — ABNORMAL LOW (ref 12.0–15.0)
MCH: 29.6 pg (ref 26.0–34.0)
MCHC: 31.5 g/dL (ref 30.0–36.0)
MCV: 93.9 fL (ref 80.0–100.0)
Platelets: 361 10*3/uL (ref 150–400)
RBC: 3.95 MIL/uL (ref 3.87–5.11)
RDW: 15.7 % — ABNORMAL HIGH (ref 11.5–15.5)
WBC: 9.3 10*3/uL (ref 4.0–10.5)
nRBC: 0 % (ref 0.0–0.2)

## 2021-04-09 NOTE — ED Notes (Signed)
[facility] present to transport pt to [facility] by ambulance.

## 2021-04-09 NOTE — ED Notes (Signed)
Unable to obtain signature for discharge due to pt dementia.

## 2021-04-09 NOTE — ED Notes (Signed)
Called EMS for transport back to [facility]

## 2021-04-09 NOTE — ED Provider Notes (Signed)
[facility] Emergency Department Provider Note   ____________________________________________   Event Date/Time   First MD Initiated Contact with Patient 04/09/21 2006     (approximate)  I have reviewed the triage vital signs and the nursing notes.   HISTORY  Chief Complaint Hypoxia and Fall    HPI Gina Hodges is a 87 y.o. female with past medical history of dementia, hyperlipidemia, GERD, hypothyroidism, and paroxysmal atrial fibrillation who presents to the ED following fall.  History is limited due to patient's severe dementia.  Patient resides at [facility] and per EMS she had an unwitnessed fall at her nursing facility.  She was found down on the ground at 5 PM this afternoon, it is unknown how long she has been on the ground.  She reportedly had an O2 sat of 70% on room air and she does not wear supplemental oxygen at baseline.  Patient was placed on 4 L nasal cannula with improvement prior to being brought to the ED.  Patient states that she "hurts all over" but states she always hurts this way.  She denies any shortness of breath.        Past Medical History:  Diagnosis Date   Anemia    Anxiety    Cerebral infarct (HCC)    Dementia (HCC)    Dementia with behavioral problem (HCC)    GERD (gastroesophageal reflux disease)    Hyperlipidemia    Osteoporosis    PAF (paroxysmal atrial fibrillation) (HCC)    Stroke (HCC)    Thyroid disease    Ulcerative esophagitis    UTI (urinary tract infection)    Vertigo     Patient Active Problem List   Diagnosis Date Noted   Malnutrition of moderate degree 03/31/2021   Pressure injury of skin 03/29/2021   Hypokalemia 03/29/2021   Prediabetes 03/29/2021   Multifocal pneumonia 03/06/2021   Sepsis (HCC) 11/10/2019   Fall 08/13/2019   SIRS (systemic inflammatory response syndrome) (HCC)    Ingrown nail 11/12/2013   Compression fracture of spine (HCC) 11/07/2013   UTI (urinary tract  infection) 11/07/2013   Hypothyroidism 11/07/2013   Hyperlipidemia    Dementia with behavioral problem (HCC)    GERD (gastroesophageal reflux disease)    PAF (paroxysmal atrial fibrillation) (HCC)    Generalized anxiety disorder 04/05/2013    Past Surgical History:  Procedure Laterality Date   ABDOMINAL HYSTERECTOMY     APPENDECTOMY     CHOLECYSTECTOMY      Prior to Admission medications   Medication Sig Start Date End Date Taking? Authorizing Provider  acetaminophen (TYLENOL) 650 MG CR tablet Take 650 mg by mouth 3 (three) times daily.    [provider]  ALPRAZolam Prudy Feeler) 0.5 MG tablet Take 1 tablet (0.5 mg total) by mouth 2 (two) times daily. 04/03/21   Amin, Loura Halt, MD  alum & mag hydroxide-simeth (MAALOX/MYLANTA) 200-200-20 MG/5ML suspension Take 30 mLs by mouth every 8 (eight) hours as needed (acid relief).    [provider]  Calcium Carbonate-Vitamin D 600-400 MG-UNIT tablet Take 1 tablet by mouth 2 (two) times daily.     [provider]  feeding supplement (ENSURE ENLIVE / ENSURE PLUS) LIQD Take 237 mLs by mouth 2 (two) times daily between meals. 04/03/21   Amin, Loura Halt, MD  food thickener (SIMPLYTHICK, NECTAR/LEVEL 2/MILDLY THICK,) GEL Take 1 packet by mouth as needed. 04/03/21   Amin, Loura Halt, MD  hydrocortisone cream 1 % Apply topically 2 (two) times  daily. 04/03/21   Amin, Loura Halt, MD  levothyroxine (SYNTHROID, LEVOTHROID) 75 MCG tablet Take 75 mcg by mouth daily at 6 (six) AM. 02/27/13   [provider]  Lidocaine 4 % PTCH Place 1 patch onto the skin 2 (two) times daily.    [provider]  liver oil-zinc oxide (DESITIN) 40 % ointment Apply topically 3 (three) times daily as needed for irritation. 04/03/21   Amin, Loura Halt, MD  loperamide (IMODIUM) 2 MG capsule Take 2 mg by mouth every 2 (two) hours as needed for diarrhea or loose stools. 11/08/18   [provider]  loratadine (CLARITIN) 10 MG tablet Take  10 mg by mouth daily at 6 (six) AM.    [provider]  Melatonin 10 MG TABS Take 10 mg by mouth at bedtime.     [provider]  meloxicam (MOBIC) 15 MG tablet Take 15 mg by mouth daily at 6 (six) AM. 10/30/18   [provider]  mirtazapine (REMERON) 15 MG tablet Take 15 mg by mouth at bedtime. 03/03/21   [provider]  Multiple Vitamin (MULTIVITAMIN WITH MINERALS) TABS tablet Take 1 tablet by mouth daily at 6 (six) AM.    [provider]  omeprazole (PRILOSEC) 20 MG capsule Take 20 mg by mouth 2 (two) times daily. 10/17/18   [provider]  OXYGEN Inhale 2 L into the lungs continuous.    [provider]  polyethylene glycol (MIRALAX / GLYCOLAX) packet Take 17 g by mouth daily as needed (constipation).    [provider]  senna (SENOKOT) 8.6 MG TABS tablet Take 1 tablet by mouth daily at 6 (six) AM.    [provider]  TRAZODONE HCL PO Take 75 mg by mouth at bedtime.    [provider]  venlafaxine XR (EFFEXOR-XR) 75 MG 24 hr capsule Take 75 mg by mouth daily at 6 (six) AM.    [provider]    Allergies Penicillins, Bactrim [sulfamethoxazole-trimethoprim], Sulfa antibiotics, and Codeine  Family History  Problem Relation Age of Onset   Depression Mother    Bipolar disorder Daughter    Drug abuse Son     Social History Social History   Tobacco Use   Smoking status: Never   Smokeless tobacco: Never  Substance Use Topics   Alcohol use: No   Drug use: No    Review of Systems Unable to obtain secondary to dementia  ____________________________________________   PHYSICAL EXAM:  VITAL SIGNS: ED Triage Vitals  Enc Vitals Group     BP 04/09/21 1749 119/75     Pulse Rate 04/09/21 1749 92     Resp 04/09/21 1749 (!) 22     Temp 04/09/21 1749 98.3 F (36.8 C)     Temp Source 04/09/21 1749 Oral     SpO2 04/09/21 1749 98 %     Weight 04/09/21 1750 137 lb 12.6 oz (62.5 kg)      Height 04/09/21 1750 5\' 1"  (1.549 m)     Head Circumference --      Peak Flow --      Pain Score 04/09/21 1750 0     Pain Loc --      Pain Edu? --      Excl. in GC? --     Constitutional: Alert and oriented to person, but not place or time. Eyes: Conjunctivae are normal.  Pupils equal, round, and reactive to light bilaterally. Head: Atraumatic. Nose: No congestion/rhinnorhea. Mouth/Throat: Mucous membranes  are moist. Neck: Normal ROM, no midline cervical spine tenderness to palpation. Cardiovascular: Normal rate, regular rhythm. Grossly normal heart sounds.  2+ radial pulses bilaterally. Respiratory: Normal respiratory effort.  No retractions. Lungs CTAB. Gastrointestinal: Soft and nontender. No distention. Genitourinary: deferred Musculoskeletal: Small skin tear to right elbow with no bony tenderness.  No upper extremity bony tenderness to palpation noted.  No lower extremity tenderness nor edema. Neurologic:  Normal speech and language. No gross focal neurologic deficits are appreciated. Skin:  Skin is warm, dry and intact. No rash noted. Psychiatric: Mood and affect are normal. Speech and behavior are normal.  ____________________________________________   LABS (all labs ordered are listed, but only abnormal results are displayed)  Labs Reviewed  BASIC METABOLIC PANEL - Abnormal; Notable for the following components:      Result Value   Glucose, Bld 104 (*)    All other components within normal limits  CBC - Abnormal; Notable for the following components:   Hemoglobin 11.7 (*)    RDW 15.7 (*)    All other components within normal limits   ____________________________________________  EKG  ED ECG REPORT I, Chesley Noon, the attending physician, personally viewed and interpreted this ECG.   Date: 04/09/2021  EKG Time: 17:53  Rate: 94  Rhythm: normal sinus rhythm  Axis: LAD  Intervals:none  ST&T Change: None   PROCEDURES  Procedure(s) performed (including  Critical Care):  Procedures   ____________________________________________   INITIAL IMPRESSION / ASSESSMENT AND PLAN / ED COURSE      87 year old female with past medical history of dementia, hyperlipidemia, GERD, hypothyroidism, and paroxysmal atrial fibrillation who presents to the ED following unwitnessed fall at her nursing facility.  Patient is awake and alert, no focal neurologic deficits on exam, she appears to be at her neurologic baseline.  She was reportedly hypoxic at nursing facility but she denies any difficulty breathing here and has O2 sat of 96% on room air during my evaluation.  CT head is negative for acute process, chest x-ray reviewed by me and shows no infiltrate, edema, or effusion.  EKG and labs are unremarkable.  We will add on CT imaging of her cervical spine as well as x-rays of her right elbow.  If these are unremarkable, she would be appropriate for discharge back to nursing facility.  CT cervical spine is negative for acute process, right elbow x-rays negative for fracture or dislocation.  Patient remains at her baseline and is appropriate for discharge back to nursing facility.      ____________________________________________   FINAL CLINICAL IMPRESSION(S) / ED DIAGNOSES  Final diagnoses:  Fall, initial encounter  Dementia without behavioral disturbance, unspecified dementia type [facility])     ED Discharge Orders     None        Note:  This document was prepared using Dragon voice recognition software and may include unintentional dictation errors.    Chesley Noon, MD 04/09/21 2117

## 2021-04-09 NOTE — ED Notes (Signed)
Attempted to call report to [facility] at [phone]; no answer after numerous rings. Unable to reach staff for handoff report. Report given to EMS personnel prior to pt discharge.

## 2021-04-09 NOTE — ED Triage Notes (Signed)
Pt comes into the ED via [facility] from [facility] c/o fall.  Pt has DNR in place.  Pt's fall was unwitnessed.  Unknown downtime and patient was found at 17:00.  Initial saturation level was found in the 70's% RA.  Pt has right side wheezing.  Pt has chronic pain from arthritis but denies any significant new pain.  96 HR, 119/84, CBG 136, 4L at 97%.

## 2021-04-09 NOTE — ED Notes (Signed)
Pt states that she "hurts all over all the time" but does not point out any acute injuries. She does respond to verbal questioning but orientation is unclear; triage RN reports hx dementia. Side rails are raised for safety and call light is within reach. Bed in lowest position and pt verbalizes agreement to stay in bed. Bed alarm in place.

## 2021-04-29 ENCOUNTER — Ambulatory Visit (HOSPITAL_BASED_OUTPATIENT_CLINIC_OR_DEPARTMENT_OTHER): Payer: Medicare HMO | Admitting: Family

## 2021-06-11 ENCOUNTER — Emergency Department (HOSPITAL_COMMUNITY): Payer: Medicare HMO

## 2021-06-11 ENCOUNTER — Emergency Department (HOSPITAL_COMMUNITY)
Admission: EM | Admit: 2021-06-11 | Discharge: 2021-06-11 | Disposition: A | Discharge status: Home / Self Care | Payer: Medicare HMO | Attending: Emergency Medicine | Admitting: Emergency Medicine

## 2021-06-11 ENCOUNTER — Other Ambulatory Visit: Payer: Self-pay

## 2021-06-11 ENCOUNTER — Encounter (HOSPITAL_COMMUNITY): Payer: Self-pay | Admitting: *Deleted

## 2021-06-11 DIAGNOSIS — E039 Hypothyroidism, unspecified: Secondary | ICD-10-CM | POA: Diagnosis not present

## 2021-06-11 DIAGNOSIS — R410 Disorientation, unspecified: Secondary | ICD-10-CM | POA: Insufficient documentation

## 2021-06-11 DIAGNOSIS — F0390 Unspecified dementia without behavioral disturbance: Secondary | ICD-10-CM | POA: Insufficient documentation

## 2021-06-11 DIAGNOSIS — Y92129 Unspecified place in nursing home as the place of occurrence of the external cause: Secondary | ICD-10-CM | POA: Diagnosis not present

## 2021-06-11 DIAGNOSIS — W19XXXA Unspecified fall, initial encounter: Secondary | ICD-10-CM

## 2021-06-11 DIAGNOSIS — W1839XA Other fall on same level, initial encounter: Secondary | ICD-10-CM | POA: Insufficient documentation

## 2021-06-11 DIAGNOSIS — Z79899 Other long term (current) drug therapy: Secondary | ICD-10-CM | POA: Diagnosis not present

## 2021-06-11 NOTE — ED Provider Notes (Signed)
[facility] EMERGENCY DEPARTMENT Provider Note  CSN: [id number] Arrival date & time: 06/11/21 0430  Chief Complaint(s) Fall  ED Triage Notes Terrace Arabia, RN (Registered Nurse)   Date of Service: 06/11/2021  4:39 AM   Addendum   Per EMS patient was found lying beside her bed, states patient is non-ambulatory at baseline , patient is not on blood thinners. Patient is alert and is at her mental baseline per ems per staff. Patient states she was going to the kitchen and slipped off the end of the bed.     HPI Gina Hodges is a 87 y.o. female with a past medical history listed below including dementia, paroxysmal A. fib not on blood thinners who presents to the emergency department after being found sitting on the floor next to her bed.   Remainder of history, ROS, and physical exam limited due to patient's condition (dementia). Additional information was obtained from EMS.   Level V Caveat.  Patient currently denies any headache, neck pain, back pain, chest pain, abdominal pain, extremity pain.  Fall   Past Medical History Past Medical History:  Diagnosis Date   Anemia    Anxiety    Cerebral infarct (HCC)    Dementia (HCC)    Dementia with behavioral problem (HCC)    GERD (gastroesophageal reflux disease)    Hyperlipidemia    Osteoporosis    PAF (paroxysmal atrial fibrillation) (HCC)    Stroke (HCC)    Thyroid disease    Ulcerative esophagitis    UTI (urinary tract infection)    Vertigo    Patient Active Problem List   Diagnosis Date Noted   Malnutrition of moderate degree 03/31/2021   Pressure injury of skin 03/29/2021   Hypokalemia 03/29/2021   Prediabetes 03/29/2021   Multifocal pneumonia 03/06/2021   Sepsis (HCC) 11/10/2019   Fall 08/13/2019   SIRS (systemic inflammatory response syndrome) (HCC)    Ingrown nail 11/12/2013   Compression fracture of spine (HCC) 11/07/2013   UTI (urinary tract infection) 11/07/2013   Hypothyroidism 11/07/2013    Hyperlipidemia    Dementia with behavioral problem (HCC)    GERD (gastroesophageal reflux disease)    PAF (paroxysmal atrial fibrillation) (HCC)    Generalized anxiety disorder 04/05/2013   Home Medication(s) Prior to Admission medications   Medication Sig Start Date End Date Taking? Authorizing Provider  acetaminophen (TYLENOL) 650 MG CR tablet Take 650 mg by mouth 3 (three) times daily.    [provider]  ALPRAZolam Prudy Feeler) 0.5 MG tablet Take 1 tablet (0.5 mg total) by mouth 2 (two) times daily. 04/03/21   Amin, Loura Halt, MD  alum & mag hydroxide-simeth (MAALOX/MYLANTA) 200-200-20 MG/5ML suspension Take 30 mLs by mouth every 8 (eight) hours as needed (acid relief).    [provider]  Calcium Carbonate-Vitamin D 600-400 MG-UNIT tablet Take 1 tablet by mouth 2 (two) times daily.     [provider]  feeding supplement (ENSURE ENLIVE / ENSURE PLUS) LIQD Take 237 mLs by mouth 2 (two) times daily between meals. 04/03/21   Amin, Loura Halt, MD  food thickener (SIMPLYTHICK, NECTAR/LEVEL 2/MILDLY THICK,) GEL Take 1 packet by mouth as needed. 04/03/21   Amin, Loura Halt, MD  hydrocortisone cream 1 % Apply topically 2 (two) times daily. 04/03/21   Amin, Loura Halt, MD  levothyroxine (SYNTHROID, LEVOTHROID) 75 MCG tablet Take 75 mcg by mouth daily at 6 (six) AM. 02/27/13   [provider]  Lidocaine 4 % PT[facility] Place 1  patch onto the skin 2 (two) times daily.    [provider]  liver oil-zinc oxide (DESITIN) 40 % ointment Apply topically 3 (three) times daily as needed for irritation. 04/03/21   Amin, Loura Halt, MD  loperamide (IMODIUM) 2 MG capsule Take 2 mg by mouth every 2 (two) hours as needed for diarrhea or loose stools. 11/08/18   [provider]  loratadine (CLARITIN) 10 MG tablet Take 10 mg by mouth daily at 6 (six) AM.    [provider]  Melatonin 10 MG TABS Take 10 mg by mouth at bedtime.     [provider]  meloxicam  (MOBIC) 15 MG tablet Take 15 mg by mouth daily at 6 (six) AM. 10/30/18   [provider]  mirtazapine (REMERON) 15 MG tablet Take 15 mg by mouth at bedtime. 03/03/21   [provider]  Multiple Vitamin (MULTIVITAMIN WITH MINERALS) TABS tablet Take 1 tablet by mouth daily at 6 (six) AM.    [provider]  omeprazole (PRILOSEC) 20 MG capsule Take 20 mg by mouth 2 (two) times daily. 10/17/18   [provider]  OXYGEN Inhale 2 L into the lungs continuous.    [provider]  polyethylene glycol (MIRALAX / GLYCOLAX) packet Take 17 g by mouth daily as needed (constipation).    [provider]  senna (SENOKOT) 8.6 MG TABS tablet Take 1 tablet by mouth daily at 6 (six) AM.    [provider]  TRAZODONE HCL PO Take 75 mg by mouth at bedtime.    [provider]  venlafaxine XR (EFFEXOR-XR) 75 MG 24 hr capsule Take 75 mg by mouth daily at 6 (six) AM.    [provider]                                                                                                                                    Past Surgical History Past Surgical History:  Procedure Laterality Date   ABDOMINAL HYSTERECTOMY     APPENDECTOMY     CHOLECYSTECTOMY     Family History Family History  Problem Relation Age of Onset   Depression Mother    Bipolar disorder Daughter    Drug abuse Son     Social History Social History   Tobacco Use   Smoking status: Never   Smokeless tobacco: Never  Substance Use Topics   Alcohol use: No   Drug use: No   Allergies Penicillins, Bactrim [sulfamethoxazole-trimethoprim], Sulfa antibiotics, and Codeine  Review of Systems Review of Systems Unable to obtain due to dementia Physical Exam Vital Signs  I have reviewed the triage vital signs BP (!) 147/80 (BP Location: Right Arm)   Pulse 92   Temp 97.8 F (36.6 C)   Resp (!) 22   Ht 5\' 1"  (1.549 m)   Wt 62.5 kg   SpO2 100%   BMI 26.03 kg/m  Physical Exam Constitutional:      General: She is not in acute distress.    Appearance: She is well-developed. She is not diaphoretic.     Interventions: Cervical collar in place.  HENT:     Head: Normocephalic and atraumatic.     Right Ear: External ear normal.     Left Ear: External ear normal.     Nose: Nose normal.  Eyes:     General: No scleral icterus.       Right eye: No discharge.        Left eye: No discharge.     Conjunctiva/sclera: Conjunctivae normal.     Pupils: Pupils are equal, round, and reactive to light.  Cardiovascular:     Rate and Rhythm: Normal rate and regular rhythm.     Pulses:          Radial pulses are 2+ on the right side and 2+ on the left side.       Dorsalis pedis pulses are 2+ on the right side and 2+ on the left side.     Heart sounds: Normal heart sounds. No murmur heard.   No friction rub. No gallop.  Pulmonary:     Effort: Pulmonary effort is normal. No respiratory distress.     Breath sounds: Normal breath sounds. No stridor. No wheezing.  Abdominal:     General: There is no distension.     Palpations: Abdomen is soft.     Tenderness: There is no abdominal tenderness.  Musculoskeletal:        General: No tenderness.     Cervical back: Normal range of motion and neck supple. No bony tenderness.     Thoracic back: No bony tenderness.     Lumbar back: No bony tenderness.     Comments: Clavicles stable. Chest stable to AP/Lat compression. Pelvis stable to Lat compression. No obvious extremity deformity. No chest or abdominal wall contusion.  Skin:    General: Skin is warm and dry.     Findings: No erythema or rash.  Neurological:     Mental Status: She is alert. She is disoriented.     Comments: Moving all extremities    ED Results and Treatments Labs (all labs ordered are listed, but only abnormal results are displayed) Labs Reviewed - No data to display                                                                                                                        EKG  EKG Interpretation  Date/Time:  Thursday June 11 2021 04:42:15 EDT Ventricular Rate:  85 PR Interval:    QRS Duration: 78 QT Interval:  360 QTC Calculation: 428 R Axis:   -9 Text Interpretation: Atrial fibrillation Low voltage QRS Abnormal ECG No significant change was found Confirmed by Drema Pryardama, Fowler Antos [phone]([id number]) on 06/11/2021 5:22:01 AM       Radiology DG Chest 1 View  Result Date: 06/11/2021 CLINICAL DATA:  Found down. EXAM:  CHEST  1 VIEW COMPARISON:  Chest x-ray 04/09/2021 FINDINGS: The cardiac silhouette, mediastinal hilar contours are within normal limits and stable. Chronic lung changes but no acute pulmonary findings. Stable moderate to large hiatal hernia. The bony thorax is intact. No acute fractures are identified. Remote healed rib fractures are noted. Mid and lower thoracic vertebral augmentation changes are stable. IMPRESSION: Chronic lung changes but no acute pulmonary findings. Grossly intact bony thorax. Electronically Signed   By: Rudie Meyer M.D.   On: 06/11/2021 05:58   DG Pelvis 1-2 Views  Result Date: 06/11/2021 CLINICAL DATA:  Found down. EXAM: PELVIS - 1-2 VIEW COMPARISON:  None. FINDINGS: Both hips are normally located. No acute hip fracture is identified. The pubic symphysis and SI joints are intact. No pelvic fractures or bone lesions. IMPRESSION: No acute bony findings. Electronically Signed   By: Rudie Meyer M.D.   On: 06/11/2021 06:00   CT HEAD WO CONTRAST ( )  Result Date: 06/11/2021 CLINICAL DATA:  Found down. EXAM: CT HEAD WITHOUT CONTRAST CT CERVICAL SPINE WITHOUT CONTRAST TECHNIQUE: Multidetector CT imaging of the head and cervical spine was performed following the standard protocol without intravenous contrast. Multiplanar CT image reconstructions of the cervical spine were also generated. COMPARISON:  Numerous prior head CTs. The most recent CT scan is 04/04/2021. FINDINGS: CT HEAD FINDINGS Brain: Stable  severe age related cerebral atrophy, ventriculomegaly and periventricular white matter disease. No extra-axial fluid collections are identified. No CT findings for acute hemispheric infarction or intracranial hemorrhage. No mass lesions. The brainstem and cerebellum are normal. Vascular: Stable vascular calcifications. No aneurysm hyperdense vessels. Skull: No acute skull fracture. Sinuses/Orbits: Paranasal sinuses and mastoid air cells are clear. Globes are intact. Other: No scalp lesions scalp hematoma. CT CERVICAL SPINE FINDINGS Alignment: Normal Skull base and vertebrae: No acute fracture. No primary bone lesion or focal pathologic process. Soft tissues and spinal canal: No prevertebral fluid or swelling. No visible canal hematoma. Disc levels: The spinal canal is generous. No significant spinal stenosis. Upper chest: The lung apices are grossly clear. Other: No neck mass or adenopathy. IMPRESSION: 1. Stable severe age related cerebral atrophy, ventriculomegaly and periventricular white matter disease. 2. No acute intracranial findings or skull fracture. 3. Normal alignment of the cervical vertebral bodies and no acute cervical spine fracture. Electronically Signed   By: Rudie Meyer M.D.   On: 06/11/2021 06:10   CT Cervical Spine Wo Contrast  Result Date: 06/11/2021 CLINICAL DATA:  Found down. EXAM: CT HEAD WITHOUT CONTRAST CT CERVICAL SPINE WITHOUT CONTRAST TECHNIQUE: Multidetector CT imaging of the head and cervical spine was performed following the standard protocol without intravenous contrast. Multiplanar CT image reconstructions of the cervical spine were also generated. COMPARISON:  Numerous prior head CTs. The most recent CT scan is 04/04/2021. FINDINGS: CT HEAD FINDINGS Brain: Stable severe age related cerebral atrophy, ventriculomegaly and periventricular white matter disease. No extra-axial fluid collections are identified. No CT findings for acute hemispheric infarction or intracranial  hemorrhage. No mass lesions. The brainstem and cerebellum are normal. Vascular: Stable vascular calcifications. No aneurysm hyperdense vessels. Skull: No acute skull fracture. Sinuses/Orbits: Paranasal sinuses and mastoid air cells are clear. Globes are intact. Other: No scalp lesions scalp hematoma. CT CERVICAL SPINE FINDINGS Alignment: Normal Skull base and vertebrae: No acute fracture. No primary bone lesion or focal pathologic process. Soft tissues and spinal canal: No prevertebral fluid or swelling. No visible canal hematoma. Disc levels: The spinal canal is generous. No significant spinal stenosis. Upper chest:  The lung apices are grossly clear. Other: No neck mass or adenopathy. IMPRESSION: 1. Stable severe age related cerebral atrophy, ventriculomegaly and periventricular white matter disease. 2. No acute intracranial findings or skull fracture. 3. Normal alignment of the cervical vertebral bodies and no acute cervical spine fracture. Electronically Signed   By: Rudie Meyer M.D.   On: 06/11/2021 06:10    Pertinent labs & imaging results that were available during my care of the patient were reviewed by me and considered in my medical decision making (see MDM for details).  Medications Ordered in ED Medications - No data to display                                                                                                                                   Procedures Procedures  (including critical care time)  Medical Decision Making / ED Course I have reviewed the nursing notes for this encounter and the patient's prior records (if available in EHR or on provided paperwork).  Caniya Tagle was evaluated in Emergency Department on 06/11/2021 for the symptoms described in the history of present illness. She was evaluated in the context of the global COVID-19 pandemic, which necessitated consideration that the patient might be at risk for infection with the SARS-CoV-2 virus that causes COVID-19.  Institutional protocols and algorithms that pertain to the evaluation of patients at risk for COVID-19 are in a state of rapid change based on information released by regulatory bodies including the CDC and federal and state organizations. These policies and algorithms were followed during the patient's care in the ED.     Patient found on the floor. Unsure whether she fell. No signs of trauma noted on exam. Patient has no physical complaints.   Pertinent labs & imaging results that were available during my care of the patient were reviewed by me and considered in my medical decision making:  CT head and cervical spine negative. Plain film of chest and pelvis negative.   Final Clinical Impression(s) / ED Diagnoses Final diagnoses:  Fall at nursing home   The patient appears reasonably screened and/or stabilized for discharge and I doubt any other medical condition or other [facility] requiring further screening, evaluation, or treatment in the ED at this time prior to discharge. Safe for discharge with strict return precautions.  Disposition: Discharge  Condition: Good   ED Discharge Orders     None         Follow Up: Primary care provider  Call  as needed    This chart was dictated using voice recognition software.  Despite best efforts to proofread,  errors can occur which can change the documentation meaning.    Nira Conn, MD 06/11/21 [phone]

## 2021-06-11 NOTE — ED Notes (Signed)
Patient denies pain and is resting comfortably.  

## 2021-06-11 NOTE — ED Triage Notes (Addendum)
Per EMS patient was found lying beside her bed, states patient is non-ambulatory at baseline , patient is not on blood thinners. Patient is alert and is at her mental baseline per ems per staff. Patient states she was going to the kitchen and slipped off the end of the bed.

## 2021-06-11 NOTE — ED Notes (Signed)
[facility] CALLED  °

## 2021-09-02 ENCOUNTER — Emergency Department (HOSPITAL_BASED_OUTPATIENT_CLINIC_OR_DEPARTMENT_OTHER): Payer: Medicare HMO

## 2021-09-02 ENCOUNTER — Encounter (HOSPITAL_BASED_OUTPATIENT_CLINIC_OR_DEPARTMENT_OTHER): Payer: Self-pay

## 2021-09-02 ENCOUNTER — Emergency Department (HOSPITAL_BASED_OUTPATIENT_CLINIC_OR_DEPARTMENT_OTHER)
Admission: EM | Admit: 2021-09-02 | Discharge: 2021-09-03 | Disposition: A | Discharge status: Home / Self Care | Payer: Medicare HMO | Attending: Emergency Medicine | Admitting: Emergency Medicine

## 2021-09-02 ENCOUNTER — Other Ambulatory Visit: Payer: Self-pay

## 2021-09-02 DIAGNOSIS — E1169 Type 2 diabetes mellitus with other specified complication: Secondary | ICD-10-CM | POA: Diagnosis not present

## 2021-09-02 DIAGNOSIS — E039 Hypothyroidism, unspecified: Secondary | ICD-10-CM | POA: Insufficient documentation

## 2021-09-02 DIAGNOSIS — S0993XA Unspecified injury of face, initial encounter: Secondary | ICD-10-CM | POA: Diagnosis present

## 2021-09-02 DIAGNOSIS — E785 Hyperlipidemia, unspecified: Secondary | ICD-10-CM | POA: Insufficient documentation

## 2021-09-02 DIAGNOSIS — Y92129 Unspecified place in nursing home as the place of occurrence of the external cause: Secondary | ICD-10-CM | POA: Insufficient documentation

## 2021-09-02 DIAGNOSIS — I10 Essential (primary) hypertension: Secondary | ICD-10-CM | POA: Diagnosis not present

## 2021-09-02 DIAGNOSIS — F0390 Unspecified dementia without behavioral disturbance: Secondary | ICD-10-CM | POA: Diagnosis not present

## 2021-09-02 DIAGNOSIS — Z79899 Other long term (current) drug therapy: Secondary | ICD-10-CM | POA: Diagnosis not present

## 2021-09-02 DIAGNOSIS — W19XXXA Unspecified fall, initial encounter: Secondary | ICD-10-CM | POA: Diagnosis not present

## 2021-09-02 HISTORY — DX: Essential (primary) hypertension: I10

## 2021-09-02 HISTORY — DX: Type 2 diabetes mellitus without complications: E11.9

## 2021-09-02 NOTE — ED Triage Notes (Signed)
Unwitnessed fall at facility, contusion to left eye.

## 2021-09-03 NOTE — ED Notes (Signed)
Pt resting normal breathing pattern ?

## 2021-09-03 NOTE — ED Notes (Signed)
Called [facility] to transport patient back to [facility]

## 2021-09-03 NOTE — ED Notes (Signed)
Pt taken by [facility] while this RN was in CT with another pt.

## 2021-09-03 NOTE — ED Provider Notes (Signed)
[facility] EMERGENCY DEPT Provider Note   CSN: [id number] Arrival date & time: 09/02/21  2340     History Chief Complaint  Patient presents with   Fall    Unwitnessed fall at facility, contusion to left eye.     Gina Hodges is a 87 y.o. female.  The history is provided by the EMS personnel. The history is limited by the condition of the patient (level 5 caveat dementia).  Fall This is a new problem. The current episode started 1 to 2 hours ago. The problem occurs rarely. The problem has been resolved. Pertinent negatives include no abdominal pain. Nothing aggravates the symptoms. Nothing relieves the symptoms. She has tried nothing for the symptoms.  Unwitnessed fall at nursing home.  No complaints.      Past Medical History:  Diagnosis Date   Anemia    Anxiety    Cerebral infarct (HCC)    Dementia (HCC)    Dementia with behavioral problem    Diabetes mellitus without complication (HCC)    GERD (gastroesophageal reflux disease)    Hyperlipidemia    Hypertension    Osteoporosis    PAF (paroxysmal atrial fibrillation) (HCC)    Stroke (HCC)    Thyroid disease    Ulcerative esophagitis    UTI (urinary tract infection)    Vertigo     Patient Active Problem List   Diagnosis Date Noted   Malnutrition of moderate degree 03/31/2021   Pressure injury of skin 03/29/2021   Hypokalemia 03/29/2021   Prediabetes 03/29/2021   Multifocal pneumonia 03/06/2021   Sepsis ([place]) 11/10/2019   Fall 08/13/2019   SIRS (systemic inflammatory response syndrome) ([place])    Ingrown nail 11/12/2013   Compression fracture of spine ([place]) 11/07/2013   UTI (urinary tract infection) 11/07/2013   Hypothyroidism 11/07/2013   Hyperlipidemia    Dementia with behavioral problem    GERD (gastroesophageal reflux disease)    PAF (paroxysmal atrial fibrillation) ([place])    Generalized anxiety disorder 04/05/2013    Past Surgical History:  Procedure Laterality Date   ABDOMINAL  HYSTERECTOMY     APPENDECTOMY     CHOLECYSTECTOMY       OB History   No obstetric history on file.     Family History  Problem Relation Age of Onset   Depression Mother    Bipolar disorder Daughter    Drug abuse Son     Social History   Tobacco Use   Smoking status: Never   Smokeless tobacco: Never  Substance Use Topics   Alcohol use: No   Drug use: No    Home Medications Prior to Admission medications   Medication Sig Start Date End Date Taking? Authorizing Provider  acetaminophen (TYLENOL) 650 MG CR tablet Take 650 mg by mouth 3 (three) times daily.    [provider]  ALPRAZolam Duanne Moron) 0.5 MG tablet Take 1 tablet (0.5 mg total) by mouth 2 (two) times daily. 04/03/21   Amin, Jeanella Flattery, MD  alum & mag hydroxide-simeth (MAALOX/MYLANTA) 200-200-20 MG/5ML suspension Take 30 mLs by mouth every 8 (eight) hours as needed (acid relief).    [provider]  Calcium Carbonate-Vitamin D 600-400 MG-UNIT tablet Take 1 tablet by mouth 2 (two) times daily.     [provider]  feeding supplement (ENSURE ENLIVE / ENSURE PLUS) LIQD Take 237 mLs by mouth 2 (two) times daily between meals. 04/03/21   Amin, Jeanella Flattery, MD  food thickener (SIMPLYTHICK, NECTAR/LEVEL 2/MILDLY THICK,) GEL Take 1 packet  by mouth as needed. 04/03/21   Amin, Jeanella Flattery, MD  hydrocortisone cream 1 % Apply topically 2 (two) times daily. 04/03/21   Amin, Jeanella Flattery, MD  levothyroxine (SYNTHROID, LEVOTHROID) 75 MCG tablet Take 75 mcg by mouth daily at 6 (six) AM. 02/27/13   [provider]  Lidocaine 4 % PTCH Place 1 patch onto the skin 2 (two) times daily.    [provider]  liver oil-zinc oxide (DESITIN) 40 % ointment Apply topically 3 (three) times daily as needed for irritation. 04/03/21   Amin, Jeanella Flattery, MD  loperamide (IMODIUM) 2 MG capsule Take 2 mg by mouth every 2 (two) hours as needed for diarrhea or loose stools. 11/08/18   [provider]  loratadine  (CLARITIN) 10 MG tablet Take 10 mg by mouth daily at 6 (six) AM.    [provider]  Melatonin 10 MG TABS Take 10 mg by mouth at bedtime.     [provider]  meloxicam (MOBIC) 15 MG tablet Take 15 mg by mouth daily at 6 (six) AM. 10/30/18   [provider]  mirtazapine (REMERON) 15 MG tablet Take 15 mg by mouth at bedtime. 03/03/21   [provider]  Multiple Vitamin (MULTIVITAMIN WITH MINERALS) TABS tablet Take 1 tablet by mouth daily at 6 (six) AM.    [provider]  omeprazole (PRILOSEC) 20 MG capsule Take 20 mg by mouth 2 (two) times daily. 10/17/18   [provider]  OXYGEN Inhale 2 L into the lungs continuous.    [provider]  polyethylene glycol (MIRALAX / GLYCOLAX) packet Take 17 g by mouth daily as needed (constipation).    [provider]  senna (SENOKOT) 8.6 MG TABS tablet Take 1 tablet by mouth daily at 6 (six) AM.    [provider]  TRAZODONE HCL PO Take 75 mg by mouth at bedtime.    [provider]  venlafaxine XR (EFFEXOR-XR) 75 MG 24 hr capsule Take 75 mg by mouth daily at 6 (six) AM.    [provider]    Allergies    Penicillins, Bactrim [sulfamethoxazole-trimethoprim], Sulfa antibiotics, and Codeine  Review of Systems   Review of Systems  Unable to perform ROS: Dementia  Constitutional:  Negative for fever.  HENT:  Negative for facial swelling.   Eyes:  Negative for redness.  Respiratory:  Negative for wheezing.   Cardiovascular:  Negative for leg swelling.  Gastrointestinal:  Negative for abdominal pain.  Psychiatric/Behavioral:  Negative for agitation.    Physical Exam Updated Vital Signs BP (!) 161/88   Temp 98.5 F (36.9 C) (Oral)   Resp 17   Ht 5\' 1"  (1.549 m)   Wt 66.1 kg   SpO2 96%   BMI 27.53 kg/m   Physical Exam Vitals and nursing note reviewed.  Constitutional:      General: She is not in acute distress.    Appearance: Normal appearance.  HENT:      Head: Normocephalic.     Right Ear: Tympanic membrane normal.     Left Ear: Tympanic membrane normal.     Nose: Nose normal.     Mouth/Throat:     Mouth: Mucous membranes are moist.     Pharynx: Oropharynx is clear.  Eyes:     Conjunctiva/sclera: Conjunctivae normal.     Pupils: Pupils are equal, round, and reactive to light.  Cardiovascular:     Rate and Rhythm: Normal rate and regular rhythm.  Pulses: Normal pulses.     Heart sounds: Normal heart sounds.  Pulmonary:     Effort: Pulmonary effort is normal.     Breath sounds: Normal breath sounds.  Abdominal:     General: Abdomen is flat. Bowel sounds are normal.     Palpations: Abdomen is soft.     Tenderness: There is no abdominal tenderness. There is no guarding.  Musculoskeletal:        General: No tenderness. Normal range of motion.     Right wrist: Normal. No snuff box tenderness.     Left wrist: Normal. No snuff box tenderness.     Cervical back: Normal, normal range of motion and neck supple.     Thoracic back: Normal.     Lumbar back: Normal.     Right hip: Normal.     Left hip: Normal.  Skin:    General: Skin is warm and dry.     Capillary Refill: Capillary refill takes less than 2 seconds.  Neurological:     Mental Status: She is alert.     Deep Tendon Reflexes: Reflexes normal.  Psychiatric:        Mood and Affect: Mood normal.    ED Results / Procedures / Treatments   Labs (all labs ordered are listed, but only abnormal results are displayed) Labs Reviewed - No data to display  EKG None  Radiology CT Head Wo Contrast  Result Date: 09/03/2021 CLINICAL DATA:  Fall with facial trauma EXAM: CT HEAD WITHOUT CONTRAST CT CERVICAL SPINE WITHOUT CONTRAST TECHNIQUE: Multidetector CT imaging of the head and cervical spine was performed following the standard protocol without intravenous contrast. Multiplanar CT image reconstructions of the cervical spine were also generated. COMPARISON:  None. FINDINGS: CT  HEAD FINDINGS Brain: There is no mass, hemorrhage or extra-axial collection. There is generalized atrophy without lobar predilection. There is hypoattenuation of the periventricular white matter, most commonly indicating chronic ischemic microangiopathy. Multiple old small vessel infarcts. Vascular: No abnormal hyperdensity of the major intracranial arteries or dural venous sinuses. No intracranial atherosclerosis. Skull: The visualized skull base, calvarium and extracranial soft tissues are normal. Sinuses/Orbits: No fluid levels or advanced mucosal thickening of the visualized paranasal sinuses. No mastoid or middle ear effusion. The orbits are normal. CT CERVICAL SPINE FINDINGS Alignment: No static subluxation. Facets are aligned. Occipital condyles are normally positioned. Skull base and vertebrae: No acute fracture. Soft tissues and spinal canal: No prevertebral fluid or swelling. No visible canal hematoma. Disc levels: No advanced spinal canal or neural foraminal stenosis. Upper chest: No pneumothorax, pulmonary nodule or pleural effusion. Other: Normal visualized paraspinal cervical soft tissues. IMPRESSION: 1. Chronic ischemic microangiopathy and generalized atrophy without acute intracranial abnormality. 2. No acute fracture or static subluxation of the cervical spine. Electronically Signed   By: Ulyses Jarred M.D.   On: 09/03/2021 00:33   CT Cervical Spine Wo Contrast  Result Date: 09/03/2021 CLINICAL DATA:  Fall with facial trauma EXAM: CT HEAD WITHOUT CONTRAST CT CERVICAL SPINE WITHOUT CONTRAST TECHNIQUE: Multidetector CT imaging of the head and cervical spine was performed following the standard protocol without intravenous contrast. Multiplanar CT image reconstructions of the cervical spine were also generated. COMPARISON:  None. FINDINGS: CT HEAD FINDINGS Brain: There is no mass, hemorrhage or extra-axial collection. There is generalized atrophy without lobar predilection. There is hypoattenuation  of the periventricular white matter, most commonly indicating chronic ischemic microangiopathy. Multiple old small vessel infarcts. Vascular: No abnormal hyperdensity of the major intracranial arteries  or dural venous sinuses. No intracranial atherosclerosis. Skull: The visualized skull base, calvarium and extracranial soft tissues are normal. Sinuses/Orbits: No fluid levels or advanced mucosal thickening of the visualized paranasal sinuses. No mastoid or middle ear effusion. The orbits are normal. CT CERVICAL SPINE FINDINGS Alignment: No static subluxation. Facets are aligned. Occipital condyles are normally positioned. Skull base and vertebrae: No acute fracture. Soft tissues and spinal canal: No prevertebral fluid or swelling. No visible canal hematoma. Disc levels: No advanced spinal canal or neural foraminal stenosis. Upper chest: No pneumothorax, pulmonary nodule or pleural effusion. Other: Normal visualized paraspinal cervical soft tissues. IMPRESSION: 1. Chronic ischemic microangiopathy and generalized atrophy without acute intracranial abnormality. 2. No acute fracture or static subluxation of the cervical spine. Electronically Signed   By: Deatra Robinson M.D.   On: 09/03/2021 00:33    Procedures Procedures   Medications Ordered in ED Medications - No data to display  ED Course  I have reviewed the triage vital signs and the nursing notes.  Pertinent labs & imaging results that were available during my care of the patient were reviewed by me and considered in my medical decision making (see chart for details).   Unwitnessed fall, no injuries on CT scan.  No complaints of pain on exam.  Stable for discharge with close follow up.   Final Clinical Impression(s) / ED Diagnoses Final diagnoses:  Fall, initial encounter   Return for intractable cough, coughing up blood, fevers > 100.4 unrelieved by medication, shortness of breath, intractable vomiting, chest pain, shortness of breath, weakness,  numbness, changes in speech, facial asymmetry, abdominal pain, passing out, Inability to tolerate liquids or food, cough, altered mental status or any concerns. No signs of systemic illness or infection. The patient is nontoxic-appearing on exam and vital signs are within normal limits.  I have reviewed the triage vital signs and the nursing notes. Pertinent labs & imaging results that were available during my care of the patient were reviewed by me and considered in my medical decision making (see chart for details). After history, exam, and  Rx / DC Orders ED Discharge Orders     None        Jenica Costilow, MD 09/03/21 1779

## 2021-12-12 ENCOUNTER — Other Ambulatory Visit: Payer: Self-pay

## 2021-12-12 ENCOUNTER — Encounter (HOSPITAL_BASED_OUTPATIENT_CLINIC_OR_DEPARTMENT_OTHER): Payer: Self-pay | Admitting: Emergency Medicine

## 2021-12-12 ENCOUNTER — Emergency Department (HOSPITAL_BASED_OUTPATIENT_CLINIC_OR_DEPARTMENT_OTHER): Payer: Medicare Other

## 2021-12-12 ENCOUNTER — Emergency Department (HOSPITAL_BASED_OUTPATIENT_CLINIC_OR_DEPARTMENT_OTHER)
Admission: EM | Admit: 2021-12-12 | Discharge: 2021-12-12 | Disposition: A | Discharge status: Skilled Nursing Facility | Payer: Medicare Other | Attending: Emergency Medicine | Admitting: Emergency Medicine

## 2021-12-12 DIAGNOSIS — E119 Type 2 diabetes mellitus without complications: Secondary | ICD-10-CM | POA: Diagnosis not present

## 2021-12-12 DIAGNOSIS — F0390 Unspecified dementia without behavioral disturbance: Secondary | ICD-10-CM | POA: Insufficient documentation

## 2021-12-12 DIAGNOSIS — Z79899 Other long term (current) drug therapy: Secondary | ICD-10-CM | POA: Insufficient documentation

## 2021-12-12 DIAGNOSIS — I10 Essential (primary) hypertension: Secondary | ICD-10-CM | POA: Diagnosis not present

## 2021-12-12 DIAGNOSIS — S0083XA Contusion of other part of head, initial encounter: Secondary | ICD-10-CM | POA: Diagnosis not present

## 2021-12-12 DIAGNOSIS — W19XXXA Unspecified fall, initial encounter: Secondary | ICD-10-CM

## 2021-12-12 DIAGNOSIS — S0993XA Unspecified injury of face, initial encounter: Secondary | ICD-10-CM | POA: Diagnosis present

## 2021-12-12 DIAGNOSIS — W050XXA Fall from non-moving wheelchair, initial encounter: Secondary | ICD-10-CM | POA: Diagnosis not present

## 2021-12-12 NOTE — ED Provider Notes (Addendum)
[facility] EMERGENCY DEPT Provider Note   CSN: [id number] Arrival date & time: 12/12/21  1744     History  Chief Complaint  Patient presents with   Lytle Michaels    Gina Hodges is a 88 y.o. female.  Patient is a 88 year old female with dementia, thyroid disease, atrial fibrillation, stroke, diabetes and hypertension who is presenting today for she fell out of her wheelchair once today and once yesterday.  Facility reported that patient has been at her baseline but she does suffer with behavioral outburst and did require a benzodiazepine today.  They then noted later in the day patient slid out of her wheelchair onto the floor and she had trauma to the right side of her face.  They otherwise report she has been at her baseline except a bit sleepy which is related to the medication.  Patient denies any pain anywhere.  Additional history was obtained from EMS who reported she was able to stand and pivot and did not appear to have any pain.  They did not note any other evidence of trauma.  Patient does not take any anticoagulation.  She otherwise has not recently had fever, cough, congestion, abdominal pain, nausea, vomiting or decreased oral intake.  The history is provided by the EMS personnel and the nursing home. The history is limited by the absence of a caregiver.  Fall      Home Medications Prior to Admission medications   Medication Sig Start Date End Date Taking? Authorizing Provider  acetaminophen (TYLENOL) 650 MG CR tablet Take 650 mg by mouth 3 (three) times daily.    [provider]  ALPRAZolam Duanne Moron) 0.5 MG tablet Take 1 tablet (0.5 mg total) by mouth 2 (two) times daily. 04/03/21   Amin, Jeanella Flattery, MD  alum & mag hydroxide-simeth (MAALOX/MYLANTA) 200-200-20 MG/5ML suspension Take 30 mLs by mouth every 8 (eight) hours as needed (acid relief).    [provider]  Calcium Carbonate-Vitamin D 600-400 MG-UNIT tablet Take 1 tablet by mouth 2 (two) times  daily.     [provider]  feeding supplement (ENSURE ENLIVE / ENSURE PLUS) LIQD Take 237 mLs by mouth 2 (two) times daily between meals. 04/03/21   Amin, Jeanella Flattery, MD  food thickener (SIMPLYTHICK, NECTAR/LEVEL 2/MILDLY THICK,) GEL Take 1 packet by mouth as needed. 04/03/21   Amin, Jeanella Flattery, MD  hydrocortisone cream 1 % Apply topically 2 (two) times daily. 04/03/21   Amin, Jeanella Flattery, MD  levothyroxine (SYNTHROID, LEVOTHROID) 75 MCG tablet Take 75 mcg by mouth daily at 6 (six) AM. 02/27/13   [provider]  Lidocaine 4 % PTCH Place 1 patch onto the skin 2 (two) times daily.    [provider]  liver oil-zinc oxide (DESITIN) 40 % ointment Apply topically 3 (three) times daily as needed for irritation. 04/03/21   Amin, Jeanella Flattery, MD  loperamide (IMODIUM) 2 MG capsule Take 2 mg by mouth every 2 (two) hours as needed for diarrhea or loose stools. 11/08/18   [provider]  loratadine (CLARITIN) 10 MG tablet Take 10 mg by mouth daily at 6 (six) AM.    [provider]  Melatonin 10 MG TABS Take 10 mg by mouth at bedtime.     [provider]  meloxicam (MOBIC) 15 MG tablet Take 15 mg by mouth daily at 6 (six) AM. 10/30/18   [provider]  mirtazapine (REMERON) 15 MG tablet Take 15 mg by mouth at bedtime. 03/03/21   [provider]  Multiple Vitamin (MULTIVITAMIN WITH MINERALS) TABS tablet Take 1 tablet by mouth daily at 6 (six) AM.    [provider]  omeprazole (PRILOSEC) 20 MG capsule Take 20 mg by mouth 2 (two) times daily. 10/17/18   [provider]  OXYGEN Inhale 2 L into the lungs continuous.    [provider]  polyethylene glycol (MIRALAX / GLYCOLAX) packet Take 17 g by mouth daily as needed (constipation).    [provider]  senna (SENOKOT) 8.6 MG TABS tablet Take 1 tablet by mouth daily at 6 (six) AM.    [provider]  TRAZODONE HCL PO Take 75 mg by mouth at bedtime.     [provider]  venlafaxine XR (EFFEXOR-XR) 75 MG 24 hr capsule Take 75 mg by mouth daily at 6 (six) AM.    [provider]      Allergies    Penicillins, Bactrim [sulfamethoxazole-trimethoprim], Sulfa antibiotics, and Codeine    Review of Systems   Review of Systems  Physical Exam Updated Vital Signs BP (!) 131/94 (BP Location: Right Arm)    Pulse 95    Temp 98.5 F (36.9 C) (Oral)    Resp 17    Ht 5\' 1"  (1.549 m)    Wt 66.1 kg    SpO2 92%    BMI 27.53 kg/m  Physical Exam Vitals and nursing note reviewed.  Constitutional:      General: She is not in acute distress.    Appearance: She is well-developed.  HENT:     Head: Normocephalic and atraumatic.   Eyes:     Pupils: Pupils are equal, round, and reactive to light.  Cardiovascular:     Rate and Rhythm: Normal rate. Rhythm irregularly irregular.     Heart sounds: Normal heart sounds. No murmur heard.   No friction rub.  Pulmonary:     Effort: Pulmonary effort is normal.     Breath sounds: Normal breath sounds. No wheezing or rales.  Abdominal:     General: Bowel sounds are normal. There is no distension.     Palpations: Abdomen is soft.     Tenderness: There is no abdominal tenderness. There is no guarding or rebound.  Musculoskeletal:        General: No tenderness. Normal range of motion.     Right shoulder: Normal. No tenderness. Normal range of motion.       Arms:     Cervical back: Normal range of motion.     Right hip: Normal.     Left hip: Normal.     Right knee: Normal.     Left knee: Normal.     Right ankle: Normal.     Left ankle: Normal.     Comments: Trace edema in bilateral lower ext  Skin:    General: Skin is warm and dry.     Findings: No rash.  Neurological:     Mental Status: She is alert.     Cranial Nerves: No cranial nerve deficit.     Comments: Oriented to person but noted to move all ext  Psychiatric:     Comments: Somewhat sedated and calm    ED Results / Procedures  / Treatments   Labs (all labs ordered are listed, but only abnormal results are displayed) Labs Reviewed - No data to display  EKG None  Radiology CT Head Wo Contrast  Result Date: 12/12/2021 CLINICAL DATA:  Fall EXAM: CT HEAD WITHOUT CONTRAST TECHNIQUE:  Contiguous axial images were obtained from the base of the skull through the vertex without intravenous contrast. RADIATION DOSE REDUCTION: This exam was performed according to the departmental dose-optimization program which includes automated exposure control, adjustment of the mA and/or kV according to patient size and/or use of iterative reconstruction technique. COMPARISON:  None. FINDINGS: Brain: There is atrophy and chronic small vessel disease changes. No acute intracranial abnormality. Specifically, no hemorrhage, hydrocephalus, mass lesion, acute infarction, or significant intracranial injury. Vascular: No hyperdense vessel or unexpected calcification. Skull: No acute calvarial abnormality. Sinuses/Orbits: No acute findings Other: None IMPRESSION: Atrophy, chronic microvascular disease. No acute intracranial abnormality. Electronically Signed   By: Rolm Baptise M.D.   On: 12/12/2021 18:36   CT Maxillofacial Wo Contrast  Result Date: 12/12/2021 CLINICAL DATA:  Facial trauma, blunt.  Fall. EXAM: CT MAXILLOFACIAL WITHOUT CONTRAST TECHNIQUE: Multidetector CT imaging of the maxillofacial structures was performed. Multiplanar CT image reconstructions were also generated. RADIATION DOSE REDUCTION: This exam was performed according to the departmental dose-optimization program which includes automated exposure control, adjustment of the mA and/or kV according to patient size and/or use of iterative reconstruction technique. COMPARISON:  None. FINDINGS: Osseous: No fracture or mandibular dislocation. No destructive process. Orbits: No orbital fracture.  Globes intact. Sinuses: Clear Soft tissues: Soft tissue swelling over the forehead region. Limited  intracranial: See head CT report IMPRESSION: No facial or orbital fracture. Electronically Signed   By: Rolm Baptise M.D.   On: 12/12/2021 18:38    Procedures Procedures    Medications Ordered in ED Medications - No data to display  ED Course/ Medical Decision Making/ A&P                           Medical Decision Making Amount and/or Complexity of Data Reviewed Radiology: ordered and independent interpretation performed. Decision-making details documented in ED Course.   Pt presenting today from her skilled facility after falling out of her wheelchair.  She does have ecchymosis to the right side of face.  Pt is sleepy but reportedly she received ativan today due to behavioral disturbance.  Pt is able to answer yes and no questions and denies pain.  She was able to stand wihtout pain in the legs and full ROM of the arms without pain.  Pt is not on anticoagulation. Facility otherwise reports pt has been at her baseline without new findings concerning for underlying medical concern today.  Pt is in afib here but rate is controlled and O2 sat normal on her home settings and pt in no distress.  Will image head and face to eval for injury.  Prior external medical records were reviewed.  6:44 PM I independently visualized and interpreted head CT which was neg for intracranial hemorrhage.  Radiology reports no acute findings.  Maxillofacial CT neg for fracture.  Findings reported to [facility] where the pt lives.  O/w she is well appearing and does not meet admission criteria and otherwise appears stable for d/c back to her facility.          Final Clinical Impression(s) / ED Diagnoses Final diagnoses:  Fall, initial encounter  Facial contusion, initial encounter    Rx / DC Orders ED Discharge Orders     None         Blanchie Dessert, MD 12/12/21 1844    Blanchie Dessert, MD 12/12/21 1844

## 2021-12-12 NOTE — ED Triage Notes (Signed)
Pt via ems from [facility]) after a fall today and yesterday. Both falls were from her wheelchair and were unwitnessed. Pt has abrasion to her right eye area, where she hit the carpet. Pt alert & able to answer some questions, but has baseline dementia per EMS. ?

## 2021-12-12 NOTE — Discharge Instructions (Addendum)
CAT scan of the head and face are normal today with no signs of injury.  Gina Hodges can have tylenol as needed if she experiences pain. ?

## 2021-12-12 NOTE — ED Notes (Signed)
Report given to [facility]. Pt to return to facility via [facility], Network engineer requesting transport ?

## 2021-12-12 NOTE — ED Notes (Signed)
Called [facility] to request transport back to [facility] in [place].  Was advised that patient is #3 on the list in [place] but they did not have an ETA.

## 2022-01-20 ENCOUNTER — Emergency Department (HOSPITAL_COMMUNITY): Payer: Medicare Other

## 2022-01-20 ENCOUNTER — Other Ambulatory Visit: Payer: Self-pay

## 2022-01-20 ENCOUNTER — Encounter (HOSPITAL_COMMUNITY): Payer: Self-pay | Admitting: *Deleted

## 2022-01-20 ENCOUNTER — Emergency Department (HOSPITAL_COMMUNITY)
Admission: EM | Admit: 2022-01-20 | Discharge: 2022-01-21 | Disposition: A | Discharge status: Skilled Nursing Facility | Payer: Medicare Other | Attending: Emergency Medicine | Admitting: Emergency Medicine

## 2022-01-20 DIAGNOSIS — F0390 Unspecified dementia without behavioral disturbance: Secondary | ICD-10-CM | POA: Insufficient documentation

## 2022-01-20 DIAGNOSIS — S8001XA Contusion of right knee, initial encounter: Secondary | ICD-10-CM | POA: Diagnosis not present

## 2022-01-20 DIAGNOSIS — S8002XA Contusion of left knee, initial encounter: Secondary | ICD-10-CM | POA: Insufficient documentation

## 2022-01-20 DIAGNOSIS — S40011A Contusion of right shoulder, initial encounter: Secondary | ICD-10-CM

## 2022-01-20 DIAGNOSIS — S0083XA Contusion of other part of head, initial encounter: Secondary | ICD-10-CM | POA: Diagnosis not present

## 2022-01-20 DIAGNOSIS — W06XXXA Fall from bed, initial encounter: Secondary | ICD-10-CM | POA: Diagnosis not present

## 2022-01-20 DIAGNOSIS — S00212A Abrasion of left eyelid and periocular area, initial encounter: Secondary | ICD-10-CM | POA: Diagnosis not present

## 2022-01-20 DIAGNOSIS — I10 Essential (primary) hypertension: Secondary | ICD-10-CM | POA: Diagnosis not present

## 2022-01-20 DIAGNOSIS — S0990XA Unspecified injury of head, initial encounter: Secondary | ICD-10-CM

## 2022-01-20 DIAGNOSIS — R7309 Other abnormal glucose: Secondary | ICD-10-CM | POA: Insufficient documentation

## 2022-01-20 DIAGNOSIS — W19XXXA Unspecified fall, initial encounter: Secondary | ICD-10-CM

## 2022-01-20 DIAGNOSIS — S4991XA Unspecified injury of right shoulder and upper arm, initial encounter: Secondary | ICD-10-CM | POA: Diagnosis present

## 2022-01-20 LAB — CBG MONITORING, ED: Glucose-Capillary: 113 mg/dL — ABNORMAL HIGH (ref 70–99)

## 2022-01-20 NOTE — ED Notes (Addendum)
Back from xray, no changes. Alert, NAD, calm, interactive.  ?

## 2022-01-20 NOTE — ED Notes (Signed)
Pt in CT at time of shift change ?

## 2022-01-20 NOTE — ED Provider Notes (Signed)
?[place] ?Provider Note ? ? ?CSN: [id number] ?Arrival date & time: 01/20/22  1642 ? ?  ? ?History ? ?Chief Complaint  ?Patient presents with  ? Fall  ? ? ?Gina Hodges is a 88 y.o. female. ? ?Patient is a 88 year old female who presents after a fall.  She has a history of dementia, atrial fibrillation not on anticoagulants, hypertension.  She lives at [facility].  She has been seen at this facility multiple times for falls.  She had an unwitnessed fall today where she reportedly fell out of bed.  She was lying on the floor for about 10 minutes.  She has abrasions to both of her knees and an abrasion above her left eyebrow.  History is limited due to her dementia. ? ? ?  ? ?Home Medications ?Prior to Admission medications   ?Medication Sig Start Date End Date Taking? Authorizing Provider  ?acetaminophen (TYLENOL) 650 MG CR tablet Take 650 mg by mouth 3 (three) times daily.    [provider]  ?ALPRAZolam Duanne Moron) 0.5 MG tablet Take 1 tablet (0.5 mg total) by mouth 2 (two) times daily. 04/03/21   Amin, Jeanella Flattery, MD  ?alum & mag hydroxide-simeth (MAALOX/MYLANTA) 200-200-20 MG/5ML suspension Take 30 mLs by mouth every 8 (eight) hours as needed (acid relief).    [provider]  ?Calcium Carbonate-Vitamin D 600-400 MG-UNIT tablet Take 1 tablet by mouth 2 (two) times daily.     [provider]  ?feeding supplement (ENSURE ENLIVE / ENSURE PLUS) LIQD Take 237 mLs by mouth 2 (two) times daily between meals. 04/03/21   Amin, Jeanella Flattery, MD  ?food thickener (SIMPLYTHICK, NECTAR/LEVEL 2/MILDLY THICK,) GEL Take 1 packet by mouth as needed. 04/03/21   Amin, Jeanella Flattery, MD  ?hydrocortisone cream 1 % Apply topically 2 (two) times daily. 04/03/21   Amin, Jeanella Flattery, MD  ?levothyroxine (SYNTHROID, LEVOTHROID) 75 MCG tablet Take 75 mcg by mouth daily at 6 (six) AM. 02/27/13   [provider]  ?Lidocaine 4 % PTCH Place 1 patch onto the skin 2  (two) times daily.    [provider]  ?liver oil-zinc oxide (DESITIN) 40 % ointment Apply topically 3 (three) times daily as needed for irritation. 04/03/21   Amin, Jeanella Flattery, MD  ?loperamide (IMODIUM) 2 MG capsule Take 2 mg by mouth every 2 (two) hours as needed for diarrhea or loose stools. 11/08/18   [provider]  ?loratadine (CLARITIN) 10 MG tablet Take 10 mg by mouth daily at 6 (six) AM.    [provider]  ?Melatonin 10 MG TABS Take 10 mg by mouth at bedtime.     [provider]  ?meloxicam (MOBIC) 15 MG tablet Take 15 mg by mouth daily at 6 (six) AM. 10/30/18   [provider]  ?mirtazapine (REMERON) 15 MG tablet Take 15 mg by mouth at bedtime. 03/03/21   [provider]  ?Multiple Vitamin (MULTIVITAMIN WITH MINERALS) TABS tablet Take 1 tablet by mouth daily at 6 (six) AM.    [provider]  ?omeprazole (PRILOSEC) 20 MG capsule Take 20 mg by mouth 2 (two) times daily. 10/17/18   [provider]  ?OXYGEN Inhale 2 L into the lungs continuous.    [provider]  ?polyethylene glycol (MIRALAX / GLYCOLAX) packet Take 17 g by mouth daily as needed (constipation).    [provider]  ?senna (SENOKOT) 8.6 MG TABS tablet Take 1 tablet by mouth daily at 6 (  six) AM.    [provider]  ?TRAZODONE HCL PO Take 75 mg by mouth at bedtime.    [provider]  ?venlafaxine XR (EFFEXOR-XR) 75 MG 24 hr capsule Take 75 mg by mouth daily at 6 (six) AM.    [provider]  ?   ? ?Allergies    ?Penicillins, Bactrim [sulfamethoxazole-trimethoprim], Sulfa antibiotics, and Codeine   ? ?Review of Systems   ?Review of Systems  ?Unable to perform ROS: Dementia  ? ?Physical Exam ?Updated Vital Signs ?BP (!) 109/57   Pulse 99   Temp 97.8 ?F (36.6 ?C)   Resp 16   Wt 65.8 kg   SpO2 96%   BMI 27.40 kg/m?  ?Physical Exam ?Constitutional:   ?   Appearance: She is well-developed.  ?HENT:  ?   Head: Normocephalic.  ?    Comments: Small abrasion to the left forehead.  There is some ecchymosis to her left cheek and her chin.  No facial swelling is noted.   ?Eyes:  ?   Pupils: Pupils are equal, round, and reactive to light.  ?Neck:  ?   Comments: No obvious tenderness to the cervical, thoracic or lumbosacral spine ?Cardiovascular:  ?   Rate and Rhythm: Normal rate and regular rhythm.  ?   Heart sounds: Normal heart sounds.  ?Pulmonary:  ?   Effort: Pulmonary effort is normal. No respiratory distress.  ?   Breath sounds: Normal breath sounds. No wheezing or rales.  ?Chest:  ?   Chest wall: No tenderness.  ?Abdominal:  ?   General: Bowel sounds are normal.  ?   Palpations: Abdomen is soft.  ?   Tenderness: There is no abdominal tenderness. There is no guarding or rebound.  ?Musculoskeletal:     ?   General: Normal range of motion.  ?   Comments: Superficial abrasions to the anterior portion of both knees.  There are some minor swelling and ecchymosis to both anterior knees.  There is some minor tenderness to this area.  No pain to the hips or the lower legs.  Pedal pulses are intact.  There is some ecchymosis to her right shoulder with some mild tenderness on palpation of this area.  No other pain on palpation or range of motion of the extremities  ?Lymphadenopathy:  ?   Cervical: No cervical adenopathy.  ?Skin: ?   General: Skin is warm and dry.  ?   Findings: No rash.  ?Neurological:  ?   Mental Status: She is alert and oriented to person, place, and time.  ? ? ?ED Results / Procedures / Treatments   ?Labs ?(all labs ordered are listed, but only abnormal results are displayed) ?Labs Reviewed  ?CBG MONITORING, ED - Abnormal; Notable for the following components:  ?    Result Value  ? Glucose-Capillary 113 (*)   ? All other components within normal limits  ? ? ?EKG ?None ? ?Radiology ?DG Ribs Unilateral W/Chest Right ? ?Result Date: 01/20/2022 ?CLINICAL DATA:  Status post fall. EXAM: RIGHT RIBS AND CHEST - 3+ VIEW COMPARISON:  None.  FINDINGS: No acute fracture or other bone lesions are seen involving the ribs. A chronic deformity of the fourth right rib is noted. There is no evidence of pneumothorax or pleural effusion. Both lungs are clear. Heart size and mediastinal contours are within normal limits. IMPRESSION: No acute abnormality. Electronically Signed   By: Virgina Norfolk M.D.   On: 01/20/2022 21:35  ? ?DG Shoulder Right ? ?  Result Date: 01/20/2022 ?CLINICAL DATA:  Fall EXAM: RIGHT SHOULDER - 3 VIEW COMPARISON:  None. FINDINGS: There is no evidence of fracture or dislocation. Mild degenerative changes of the [facility] joint and right glenohumeral joint. Possible nondisplaced 2nd-3rd rib fractures. Soft tissues are unremarkable. IMPRESSION: 1. No evidence of shoulder fracture or dislocation. 2. Possible nondisplaced right 2nd-3rd rib fractures. Correlate for chest wall tenderness. Electronically Signed   By: Yetta Glassman M.D.   On: 01/20/2022 18:34  ? ?DG Elbow Complete Right ? ?Result Date: 01/20/2022 ?CLINICAL DATA:  [place], right elbow pain EXAM: RIGHT ELBOW - COMPLETE 3+ VIEW COMPARISON:  04/09/2021 FINDINGS: Frontal, bilateral oblique, lateral views of the right elbow are obtained. No acute displaced fracture, subluxation, or dislocation. Mild osteoarthritis within the medial aspect of the right elbow. No joint effusion. Soft tissues are unremarkable. IMPRESSION: 1. Stable osteoarthritis.  No acute bony abnormality. Electronically Signed   By: Randa Ngo M.D.   On: 01/20/2022 18:33  ? ?CT Head Wo Contrast ? ?Result Date: 01/20/2022 ?CLINICAL DATA:  Unwitnessed fall. EXAM: CT HEAD WITHOUT CONTRAST TECHNIQUE: Contiguous axial images were obtained from the base of the skull through the vertex without intravenous contrast. RADIATION DOSE REDUCTION: This exam was performed according to the departmental dose-optimization program which includes automated exposure control, adjustment of the mA and/or kV according to patient size and/or use of  iterative reconstruction technique. COMPARISON:  None. FINDINGS: Brain: There is moderate severity cerebral atrophy with widening of the extra-axial spaces and ventricular dilatation. There are areas of decreased atte

## 2022-01-20 NOTE — ED Notes (Signed)
PTAR called 7 infront of pt ?

## 2022-01-20 NOTE — ED Notes (Signed)
Pt back to CT, not in room.  ?

## 2022-01-20 NOTE — ED Notes (Signed)
Xray here for pt. Pt to xray in stretcher. Remains alert, NAD, calm, and HOH.  ?

## 2022-01-20 NOTE — ED Triage Notes (Signed)
BIB [facility] from [facility] s/[facility], h/o advanced dementia and HOH, unwitnessed fall, fell out of bed, lying on floor for ~ 10 minutes, abrasion noted to L forehead above L eye, and bilateral knees, with skin tear to L knee. c/o R elbow, bilateral knee pain and head pain. No neck or back pain. No deformity, shortening or rotation. Unable to obtain SPO2. CMS intact. No blood thinners.  ?

## 2022-01-21 NOTE — ED Notes (Signed)
Report given to [facility] with [facility] SNF, all questions/concerns address, nothing further, RN agreed to receive pt back under her care once arrived to facility.  ?

## 2022-01-21 NOTE — ED Notes (Signed)
EMS arrived to transport pt back to SNF [facility]  ?

## 2022-01-21 NOTE — ED Notes (Signed)
Currently waiting on EMS transport still, EMS notified several hours ago of transport back to SNF. Pt resting in hall bed, NAD noted, pt awake, looking around, skin warm and dry, NAD noted. Safety in place d/t fall risk. Will continue to monitor  ?

## 2022-07-13 ENCOUNTER — Encounter: Payer: Self-pay | Admitting: Family Medicine

## 2022-07-13 ENCOUNTER — Non-Acute Institutional Stay: Payer: Medicare HMO | Admitting: Family Medicine

## 2022-07-13 VITALS — HR 72 | Resp 20

## 2022-07-13 DIAGNOSIS — Z515 Encounter for palliative care: Secondary | ICD-10-CM

## 2022-07-13 DIAGNOSIS — F03918 Unspecified dementia, unspecified severity, with other behavioral disturbance: Secondary | ICD-10-CM

## 2022-07-13 NOTE — Progress Notes (Signed)
[place] Consult Note Telephone: [phone]  Fax: [phone]    Date of encounter: 07/13/22 12:23 PM PATIENT NAME: Reesa Chew [facility] [address] [place] [address]   [phone] (home)  DOB: 1927-07-15 MRN: [id number] PRIMARY CARE PROVIDER:    System, Provider Not In,  No address on file None  REFERRING PROVIDER:   No referring provider defined for this encounter. N/A  RESPONSIBLE PARTY:    Contact Information     Name Relation Home Work [place] [phone]          I met face to face with patient in her assisted living facility. Palliative Care was asked to follow this patient by consultation request of  No ref. provider found to address advance care planning and complex medical decision making. This is a follow up visit   ASSESSMENT , SYMPTOM MANAGEMENT AND PLAN / RECOMMENDATIONS:  Dementia FAST 7 score  7c Has Venlafaxine for mood and xanax for anxiety  2.  Palliative Care Encounter Will d/c pt from [facility] as she has been picked up by [facility] Planning/Goals of Care: Decision not to resuscitate or to de-escalate disease focused treatments due to poor prognosis. CODE STATUS:  Has DNR on facility chart    Follow up Palliative Care Visit: Palliative care will d/c and not charge for current visit as pt has been picked up by [facility].    This visit was coded based on medical decision making (MDM).  PPS: 30%  HOSPICE ELIGIBILITY/DIAGNOSIS: TBD  Chief Complaint:  Palliative Care is following for chronic medical management in setting of dementia.  HISTORY OF PRESENT ILLNESS:  Ernestina Joe is a 88 y.o. year old female with dementia with behavioral disturbance and dysphagia, PAF, GERD, hx of multifocal pneumonia, hypothyroidism, compression fracture of spine, HLD, GAD, hx of SIRS, pressure injury  of skin, hypokalemia, falls and moderate malnutrition.  Pt is difficult to arouse from sleep, unable to provide any significant history when questions asked and her only comment was "I'm cold".  Pt withdrew her arm when attempting to obtain BP.  She is on O2 _0  L/min.  Spoke with facility nurse who states pt is eating well but she has to be fed and is on thickened liquids.  She is total care for ADLs, confined to bed or chair, incontinent of bowel and bladder.  She reports pressure ulcer on buttocks.  Facility nurse indicates pt is being followed by [facility] whose last visit was 07/09/22.  History obtained from review of EMR, discussion with facility staff and unable to obtain from Ms. Rubens.  I reviewed EMR for available labs, medications, imaging, studies and related documents.  There are no new records since last visit.   ROS Unable to obtain due to severe dementia  Physical Exam: Constitutional: NAD General: frail appearing  CV: S1S2, IRIR, no LE edema Pulmonary: CTAB, no increased work of breathing, no cough, on O2 @ 3L Abdomen: hypo-active BS + 4 quadrants, soft and non tender MSK: no sarcopenia, bed or chair bound Skin: warm and dry.  Noted approx 8 mm circular bruised appearing lesion to distal tip of right great toe, noted dime sized area of black purple discoloration on left heel, did not attempt to check buttocks Neuro:   noted severe cognitive impairment Psych: asleep, arousable and pt resistive to assessment, does not respond to questions  Hem/lymph/immuno: no widespread bruising  Thank you for the opportunity to participate in the care of Ms. Llewellyn.  The palliative care team will continue to follow. Please call our office at [phone] if we can be of additional assistance.   Marijo Conception, FNP -C  COVID-19 PATIENT SCREENING TOOL Asked and negative response unless otherwise noted:   Have you had symptoms of covid, tested positive or been in contact with  someone with symptoms/positive test in the past 5-10 days?  unknown

## 2022-12-03 DEATH — deceased

## 2024-04-02 IMAGING — CT CT MAXILLOFACIAL W/O CM
3 of 6 series · 16 of 47 positions shown, 19 images · non-contrast
Comparison: None.

CLINICAL DATA: Unwitnessed fall.



[Series 3: maxilllofacial 2.0 hr40 3 · axial · 0.37mm/px · z∈[+898,+1052]mm · 11 of 91 slices shown, 14 images]
[im 7/91  brain]
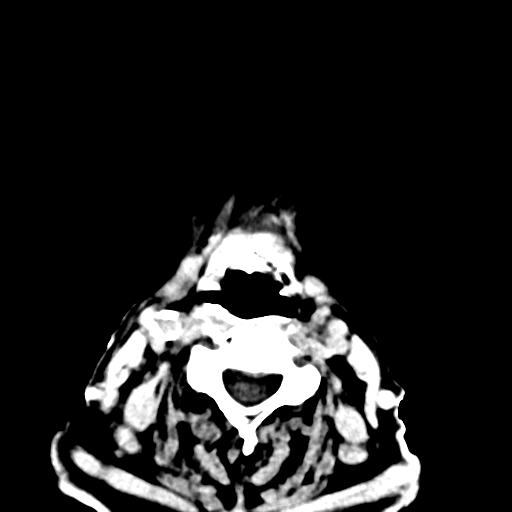
[im 7/91  bone]
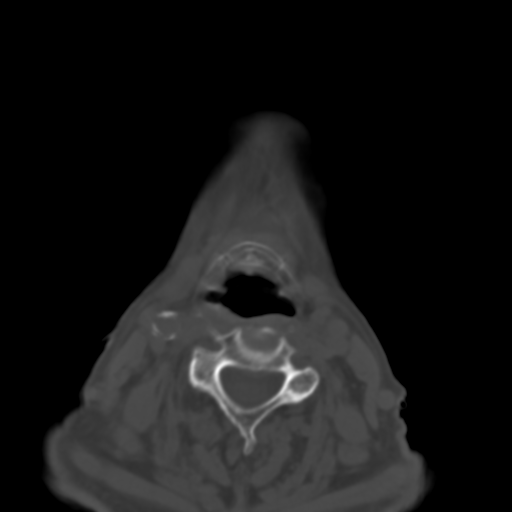
[im 13/91  bone]
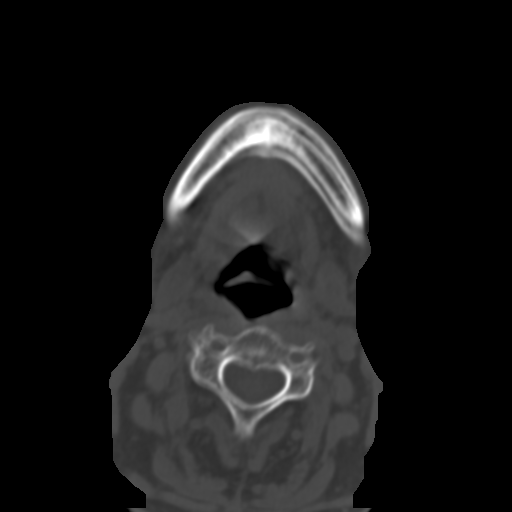
[im 20/91  bone]
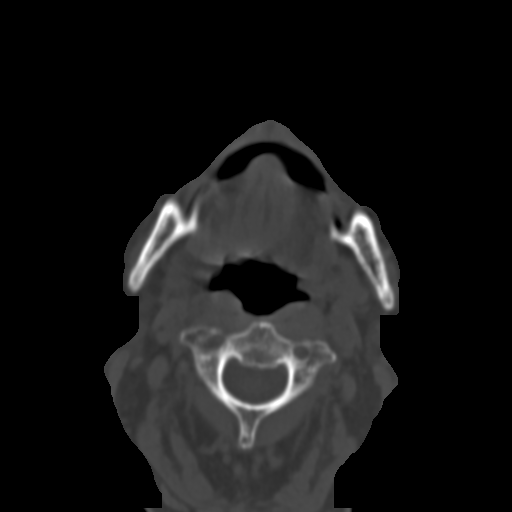
[im 33/91  bone]
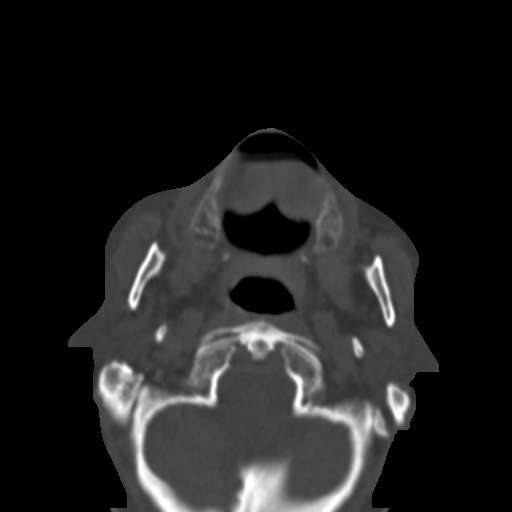
[im 39/91  brain]
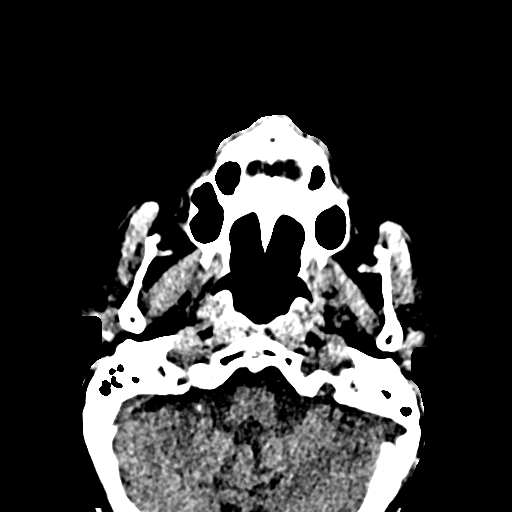
[im 39/91  bone]
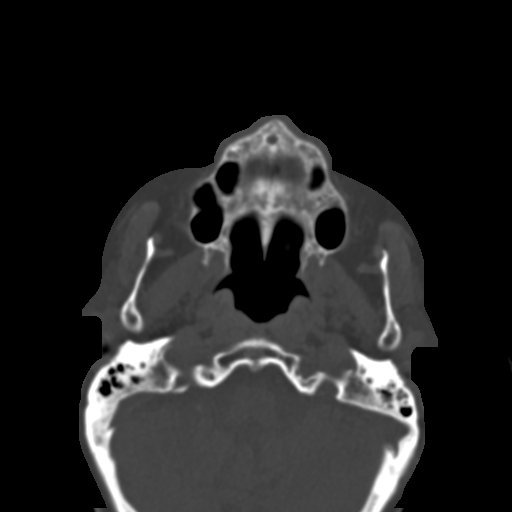
[im 46/91  bone]
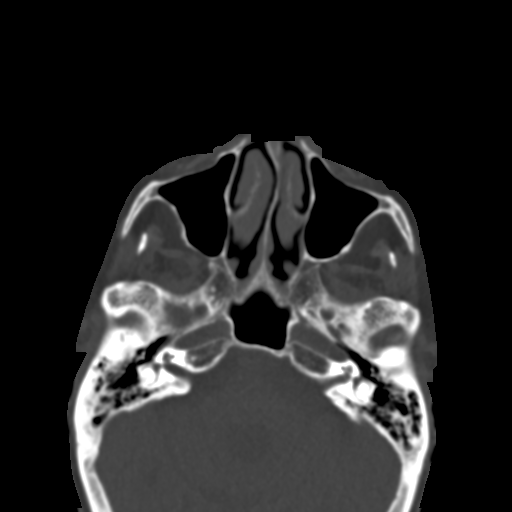
[im 52/91  bone]
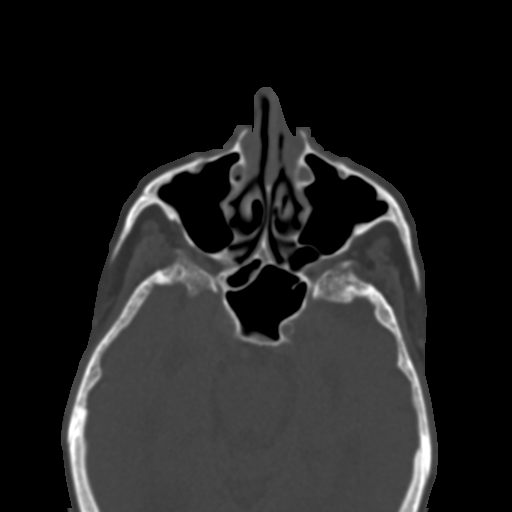
[im 58/91  bone]
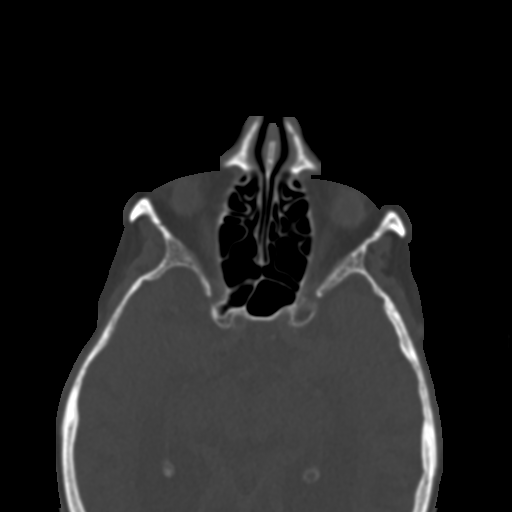
[im 71/91  brain]
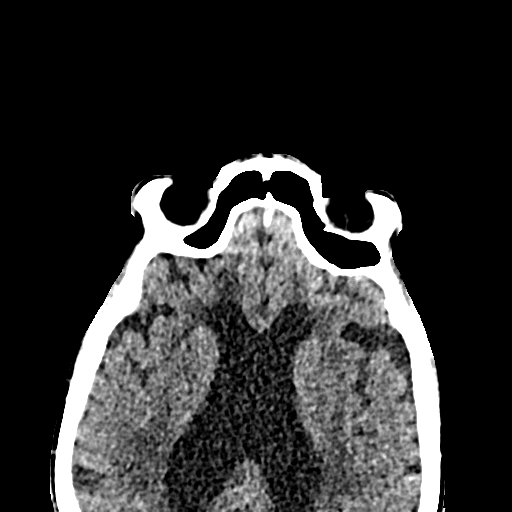
[im 71/91  bone]
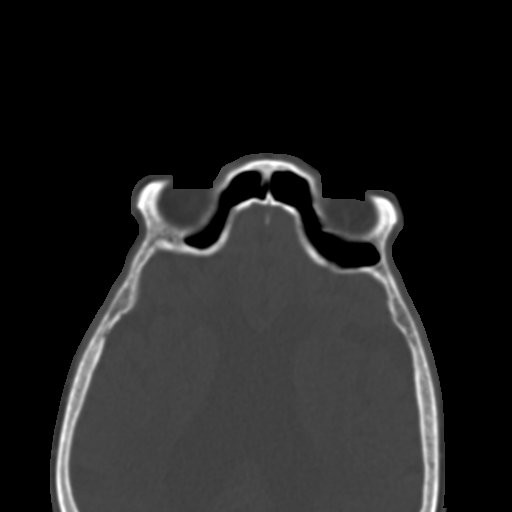
[im 78/91  bone]
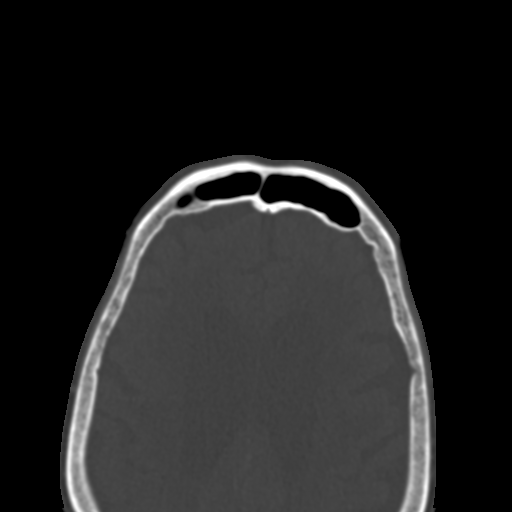
[im 84/91  bone]
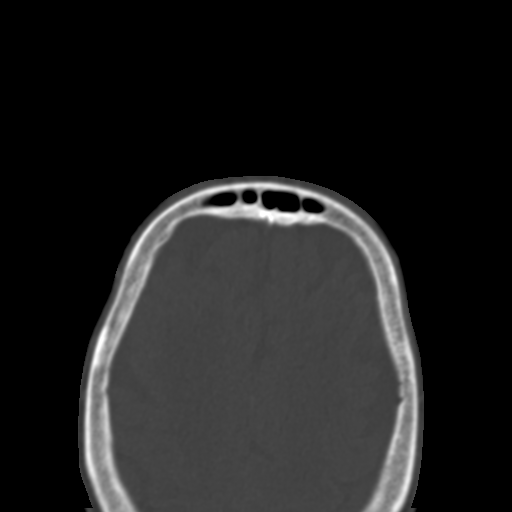

[Series 7: st cor · coronal · 0.33mm/px · 3 of 80 slices shown]
[im 20/80  bone]
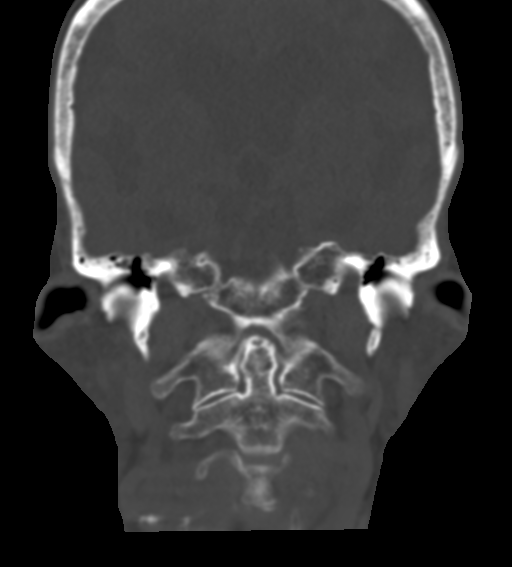
[im 40/80  bone]
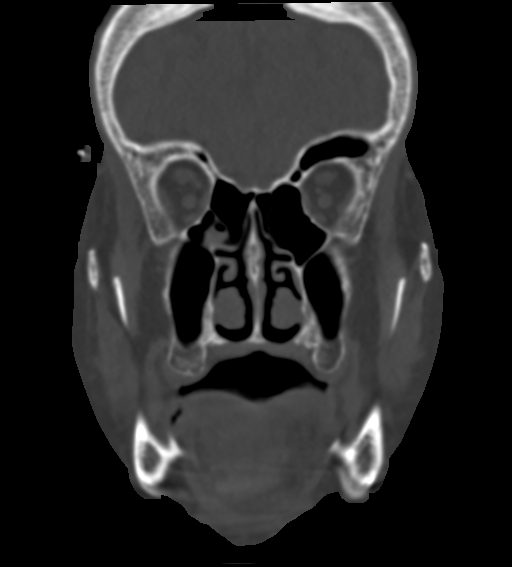
[im 60/80  bone]
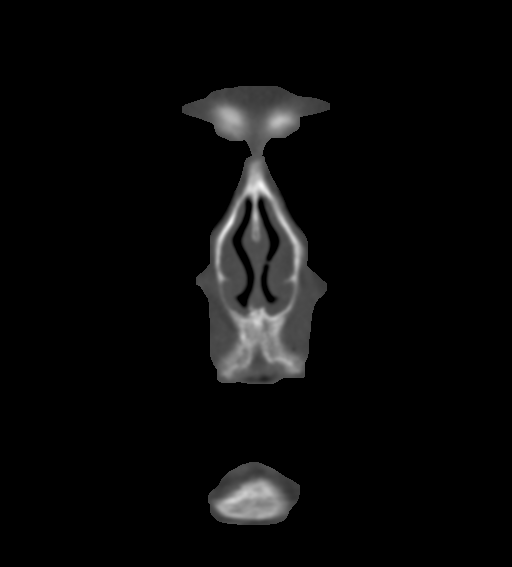

[Series 10: bone sag · sagittal · 0.31mm/px · 2 of 84 slices shown]
[im 28/84  bone]
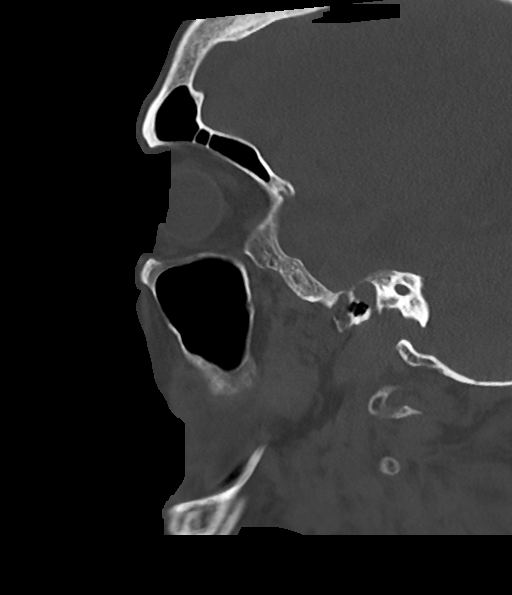
[im 56/84  bone]
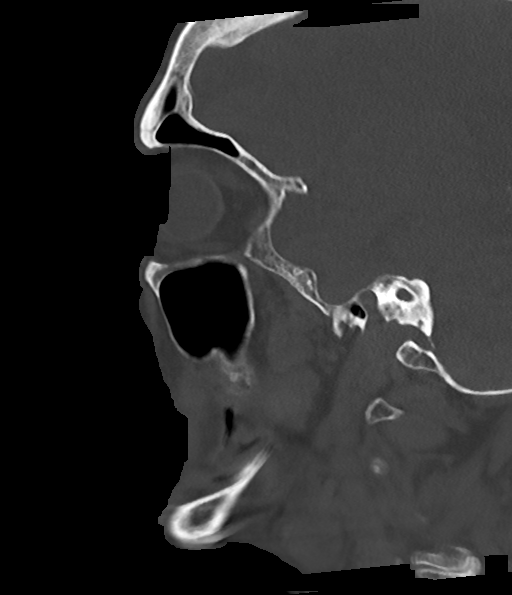

[16 of 47 positions shown; findings below may reference images not displayed]

FINDINGS: Osseous: No fracture or mandibular dislocation. No destructive
process.

Orbits: Negative. No traumatic or inflammatory finding.

Sinuses: There is mild posterior right ethmoid sinus mucosal
thickening.

Soft tissues: Very mildly asymmetric left supra orbital soft tissue
swelling is seen.

Limited intracranial: No significant or unexpected finding.
IMPRESSION: 1. Very mildly asymmetric left supra orbital soft tissue swelling.
2. No acute fracture or mandibular dislocation.

## 2024-04-02 IMAGING — CR DG ELBOW COMPLETE 3+V*R*
4 series · 4 of 4 positions shown · non-contrast
Comparison: 04/09/2021

CLINICAL DATA: Fell, right elbow pain

EXAM:
RIGHT ELBOW - COMPLETE 3+ VIEW

[elbow ap]
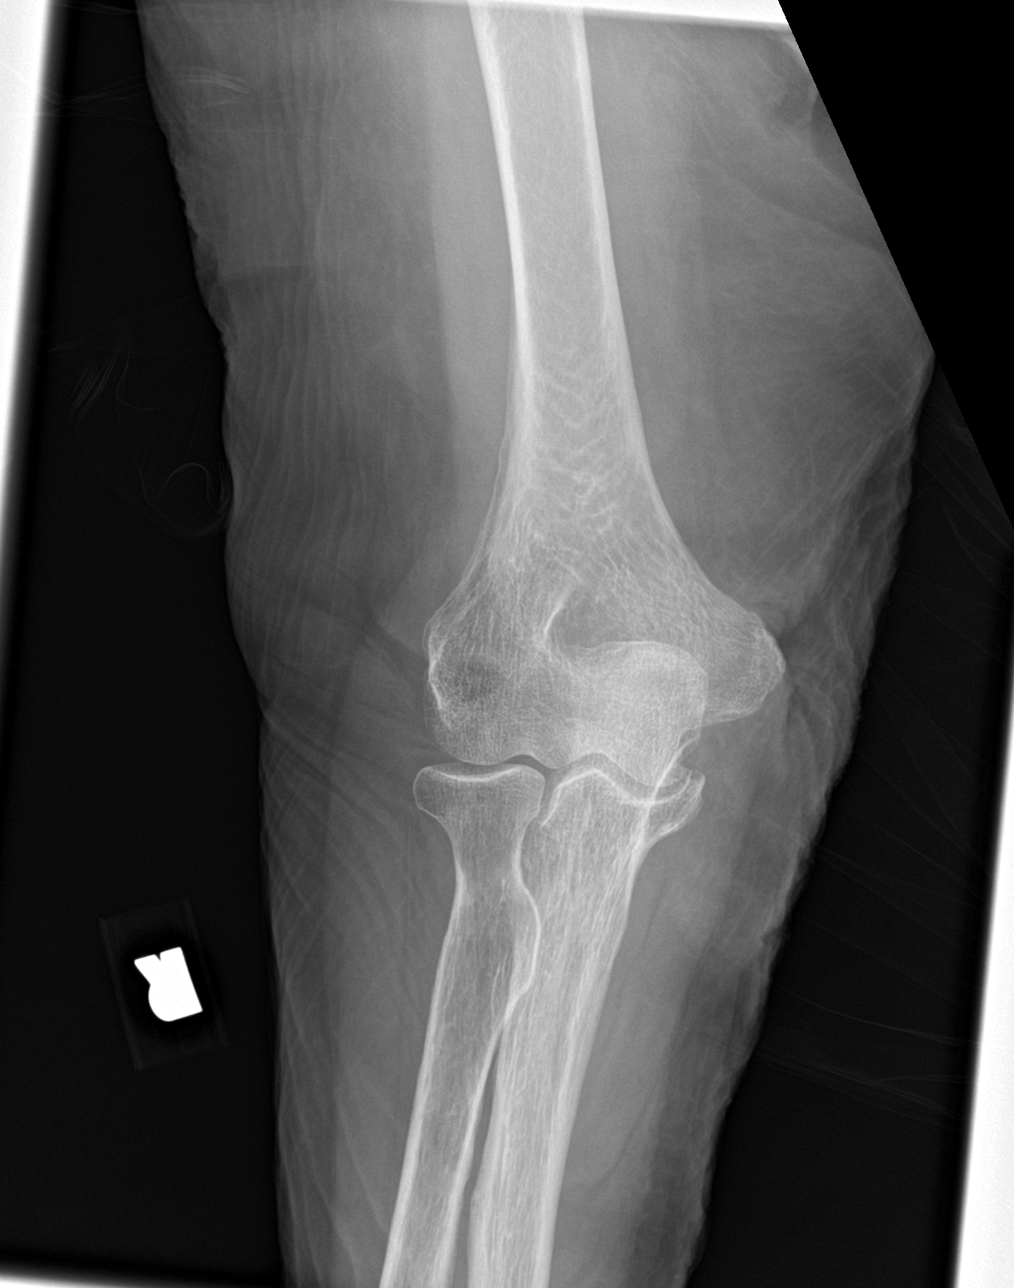

[elbow obl (1 of 2)]
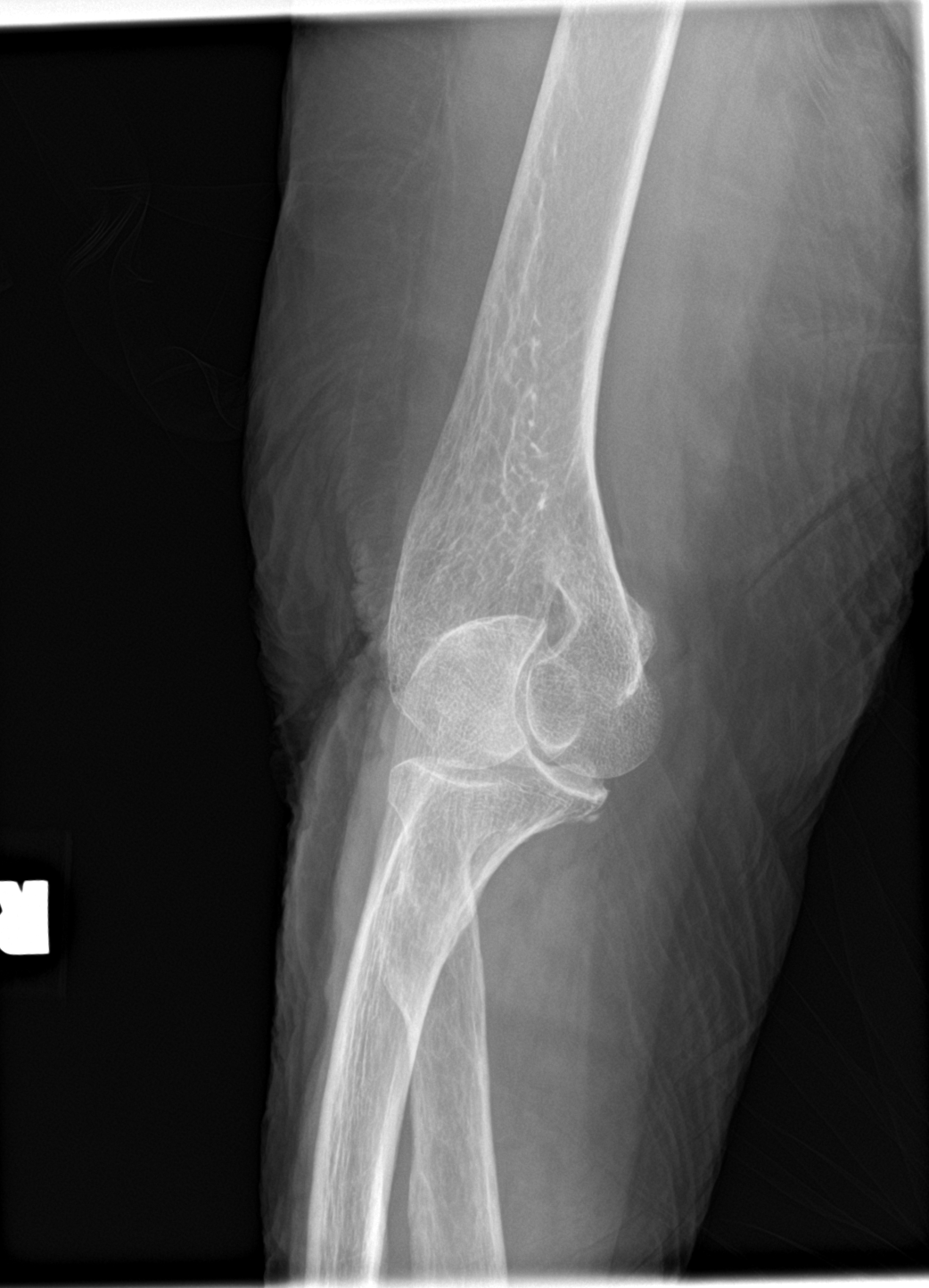

[elbow obl (2 of 2)]
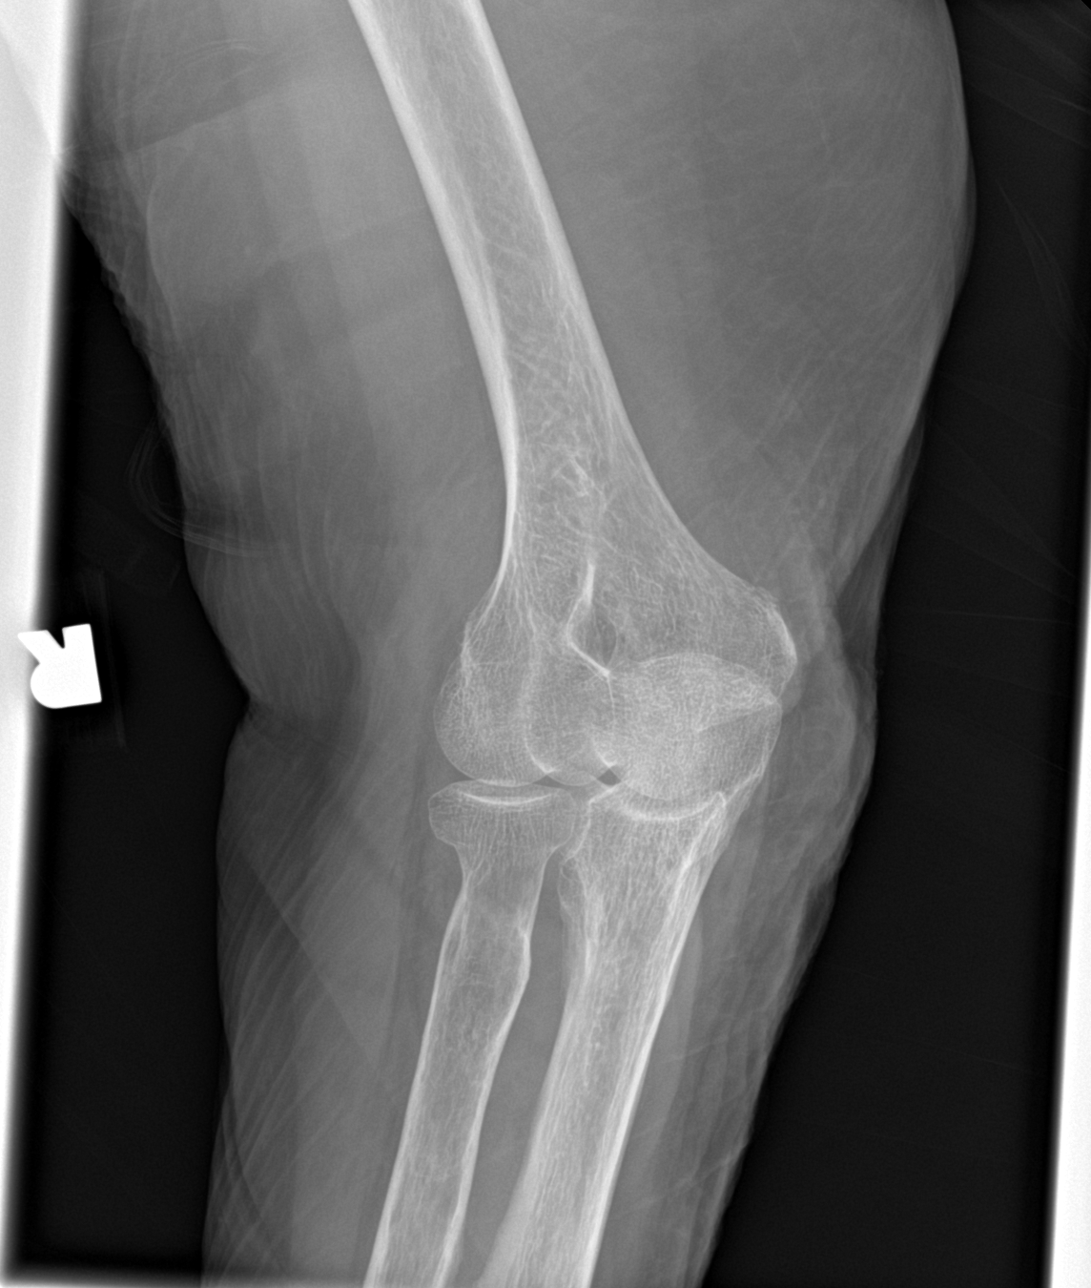

[elbow lat]
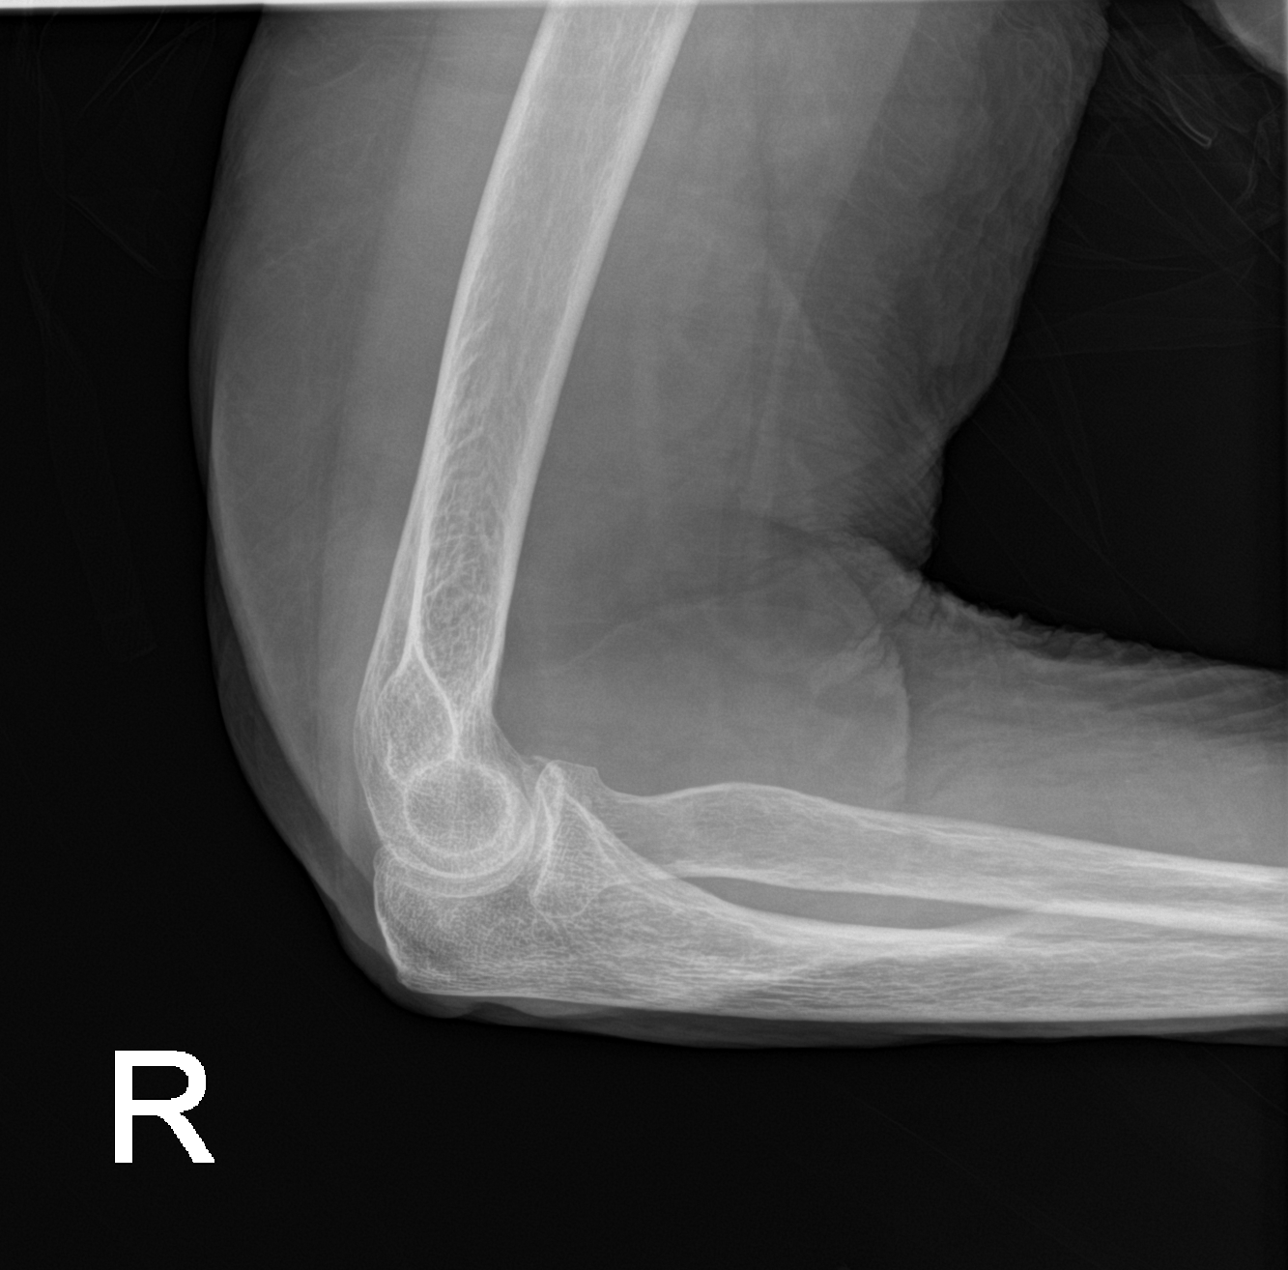

[4 of 4 positions shown; findings below may reference images not displayed]

FINDINGS: Frontal, bilateral oblique, lateral views of the right elbow are
obtained. No acute displaced fracture, subluxation, or dislocation.
Mild osteoarthritis within the medial aspect of the right elbow. No
joint effusion. Soft tissues are unremarkable.
IMPRESSION: 1. Stable osteoarthritis.  No acute bony abnormality.

## 2024-04-02 IMAGING — CR DG KNEE COMPLETE 4+V*L*
4 series · 4 of 4 positions shown · non-contrast
Comparison: None.

CLINICAL DATA: Fall, knee and shoulder pain

EXAM:
LEFT KNEE - COMPLETE 4+ VIEW

[knee ap]
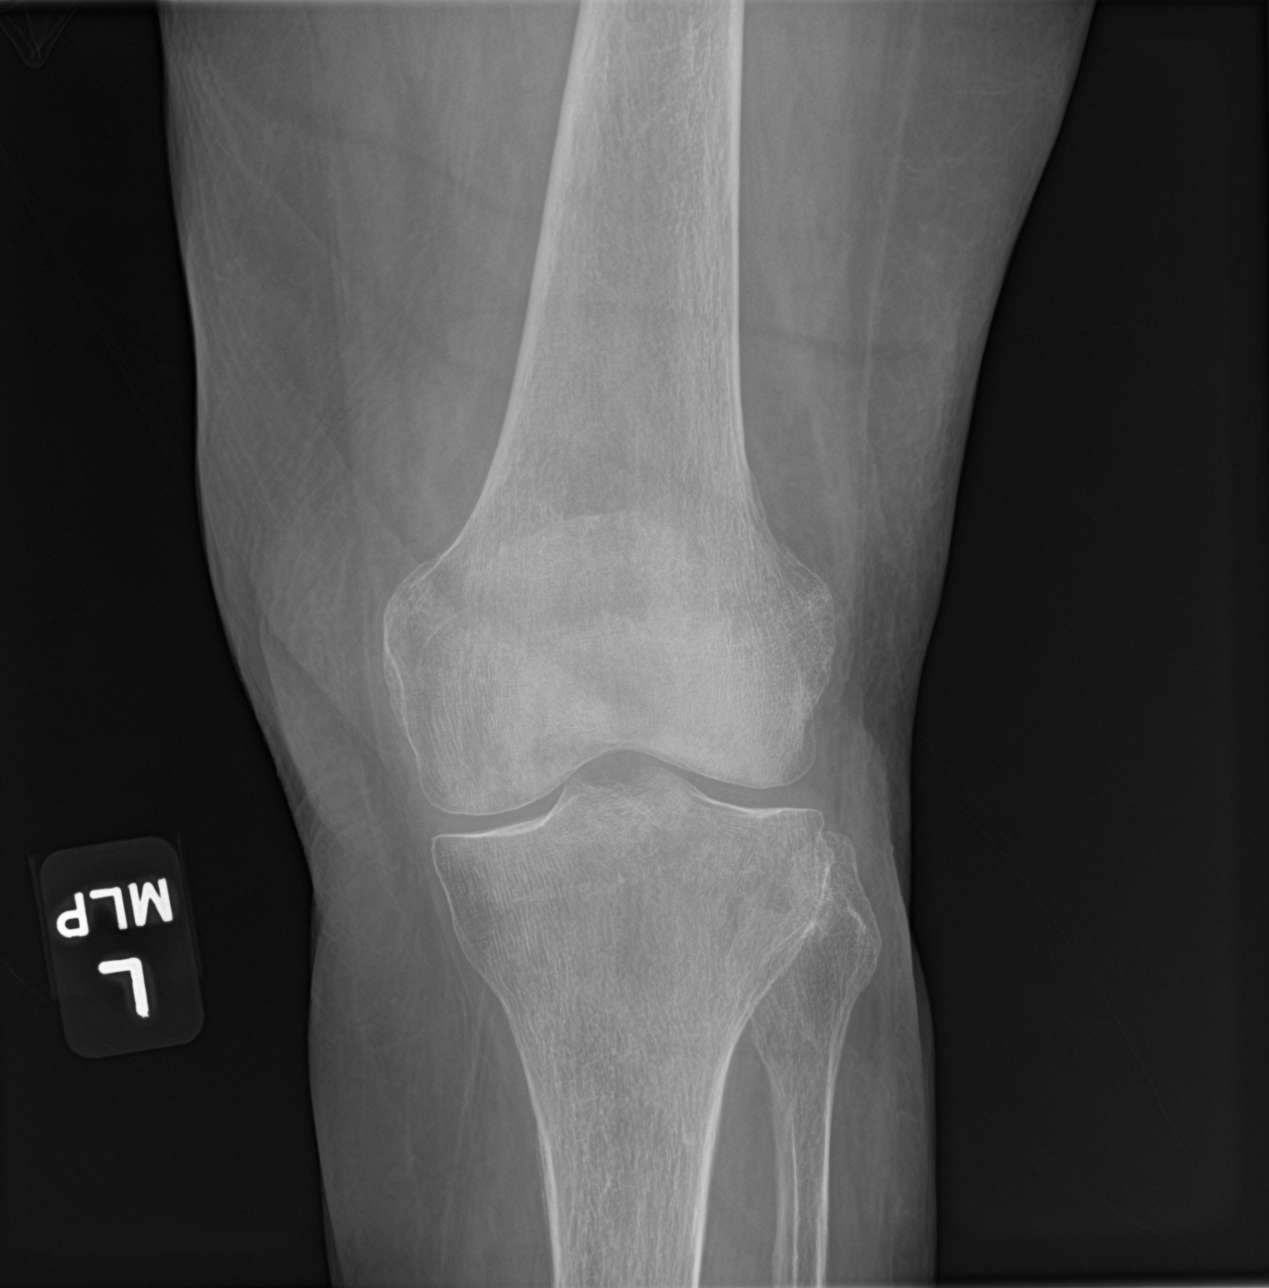

[knee lat]
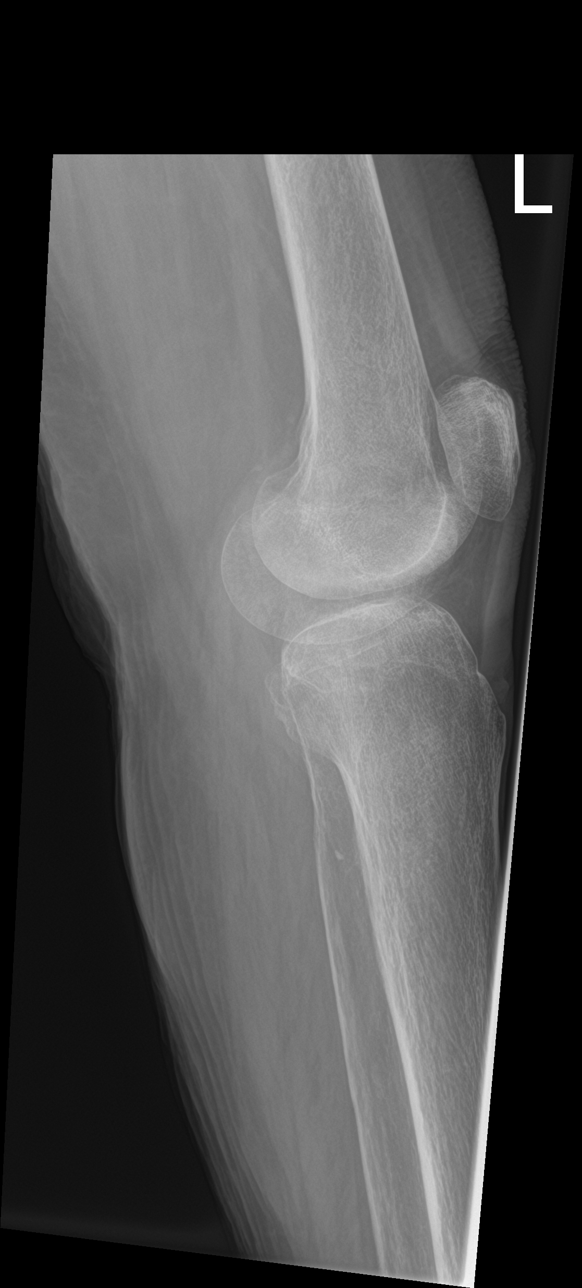

[knee obl (1 of 2)]
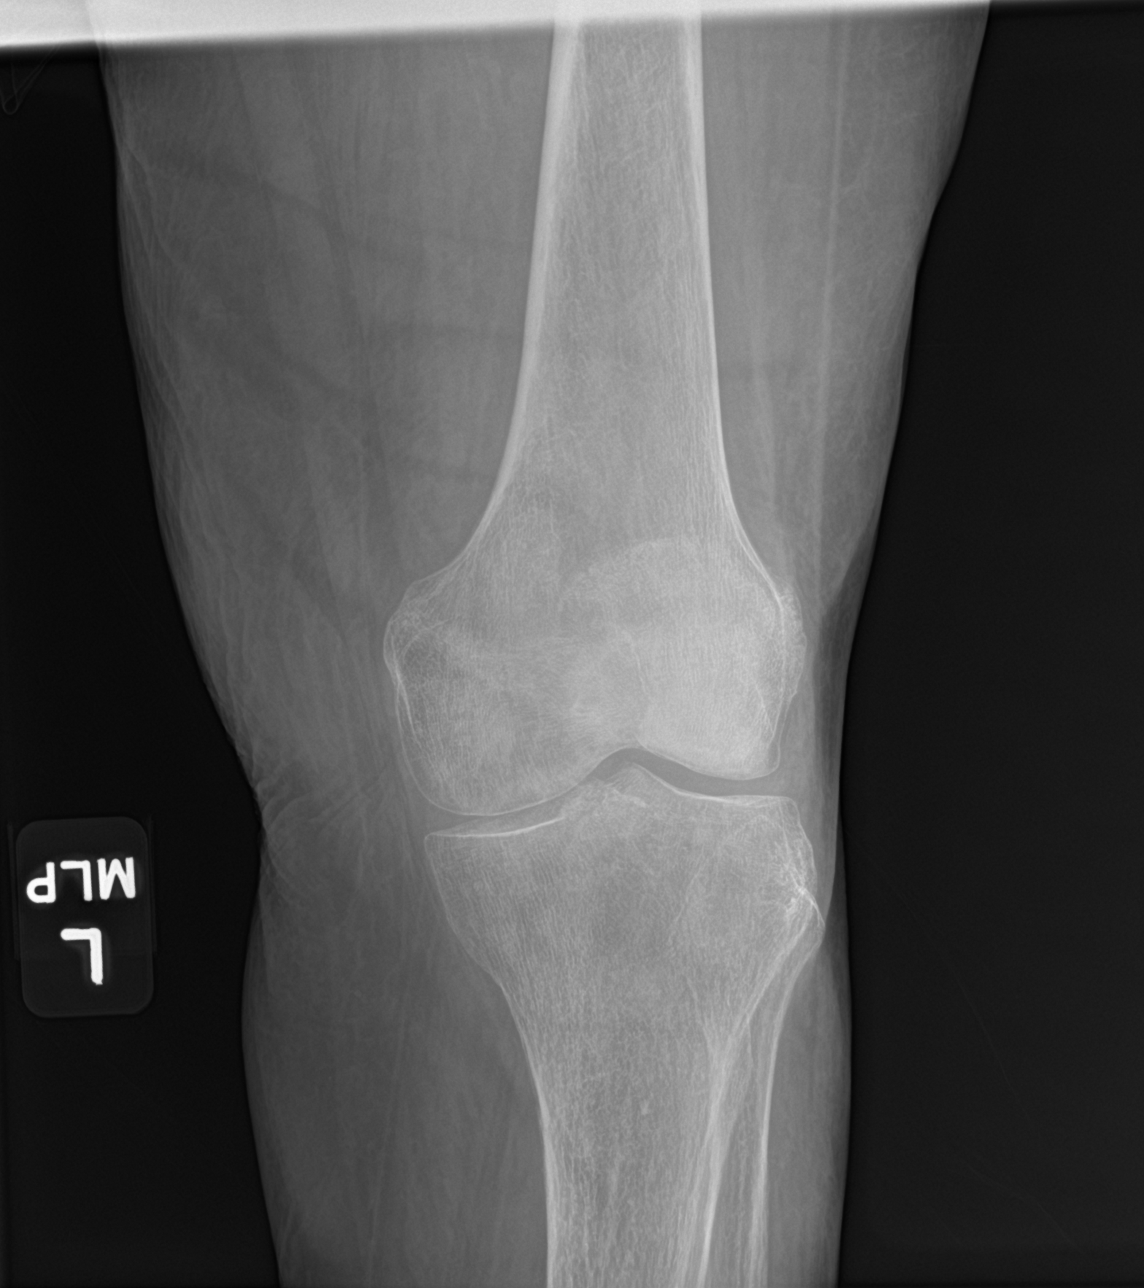

[knee obl (2 of 2)]
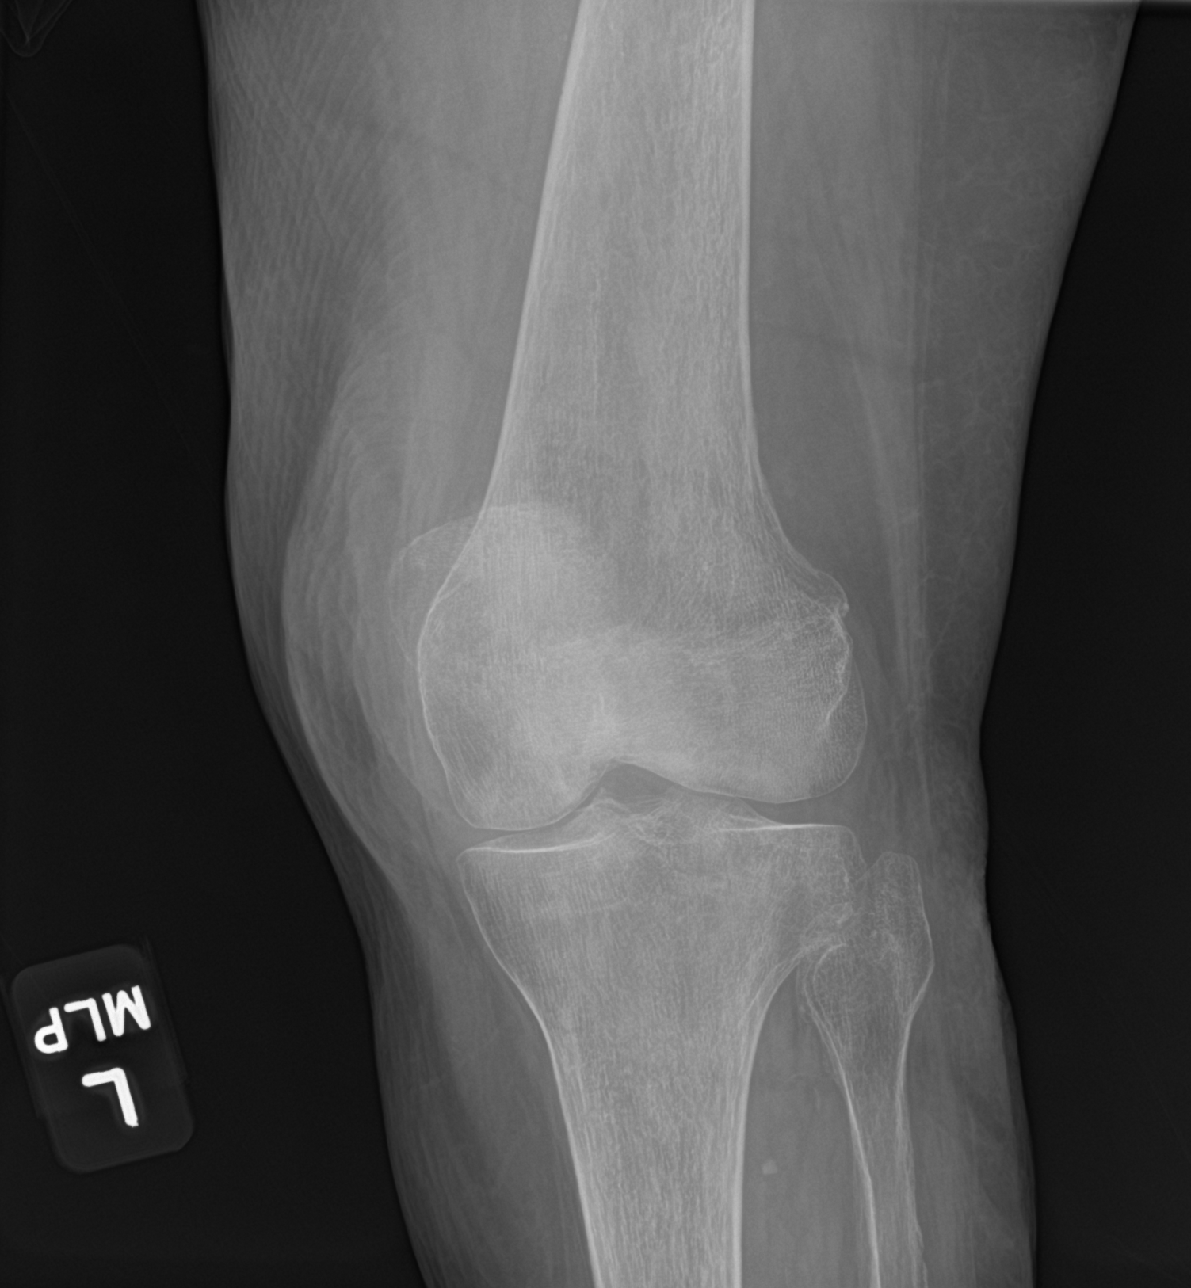

[4 of 4 positions shown; findings below may reference images not displayed]

FINDINGS: No fracture of the proximal tibia or distal femur. Patella is
normal. No joint effusion.
IMPRESSION: No fracture or dislocation.
